# Patient Record
Sex: Male | Born: 1950 | Race: Black or African American | Hispanic: No | Marital: Married | Smoking: Never smoker
Health system: Southern US, Community
[De-identification: ages and names within clinical notes are randomized; demographics above are authoritative.]

## PROBLEM LIST (undated history)

## (undated) DIAGNOSIS — E785 Hyperlipidemia, unspecified: Secondary | ICD-10-CM

## (undated) DIAGNOSIS — I2510 Atherosclerotic heart disease of native coronary artery without angina pectoris: Secondary | ICD-10-CM

## (undated) DIAGNOSIS — N029 Recurrent and persistent hematuria with unspecified morphologic changes: Secondary | ICD-10-CM

## (undated) DIAGNOSIS — I10 Essential (primary) hypertension: Secondary | ICD-10-CM

## (undated) DIAGNOSIS — N189 Chronic kidney disease, unspecified: Secondary | ICD-10-CM

## (undated) HISTORY — DX: Atherosclerotic heart disease of native coronary artery without angina pectoris: I25.10

## (undated) HISTORY — DX: Recurrent and persistent hematuria with unspecified morphologic changes: N02.9

## (undated) HISTORY — DX: Chronic kidney disease, unspecified: N18.9

## (undated) HISTORY — DX: Essential (primary) hypertension: I10

## (undated) HISTORY — DX: Hyperlipidemia, unspecified: E78.5

## (undated) HISTORY — PX: OTHER SURGICAL HISTORY: SHX169

---

## 2005-02-09 ENCOUNTER — Emergency Department: Payer: Self-pay | Admitting: Emergency Medicine

## 2005-02-09 ENCOUNTER — Other Ambulatory Visit: Payer: Self-pay

## 2006-02-27 ENCOUNTER — Ambulatory Visit: Payer: Self-pay | Admitting: Gastroenterology

## 2009-04-06 ENCOUNTER — Ambulatory Visit: Payer: Self-pay | Admitting: Cardiovascular Disease

## 2010-03-08 ENCOUNTER — Ambulatory Visit: Payer: Self-pay

## 2015-03-23 DIAGNOSIS — E039 Hypothyroidism, unspecified: Secondary | ICD-10-CM

## 2015-03-23 DIAGNOSIS — M109 Gout, unspecified: Secondary | ICD-10-CM | POA: Insufficient documentation

## 2015-03-23 DIAGNOSIS — I25119 Atherosclerotic heart disease of native coronary artery with unspecified angina pectoris: Secondary | ICD-10-CM

## 2015-03-23 DIAGNOSIS — N029 Recurrent and persistent hematuria with unspecified morphologic changes: Secondary | ICD-10-CM

## 2015-03-23 DIAGNOSIS — E785 Hyperlipidemia, unspecified: Secondary | ICD-10-CM | POA: Insufficient documentation

## 2015-03-23 DIAGNOSIS — N182 Chronic kidney disease, stage 2 (mild): Secondary | ICD-10-CM | POA: Insufficient documentation

## 2015-03-23 DIAGNOSIS — I10 Essential (primary) hypertension: Secondary | ICD-10-CM | POA: Insufficient documentation

## 2015-03-23 DIAGNOSIS — I2510 Atherosclerotic heart disease of native coronary artery without angina pectoris: Secondary | ICD-10-CM | POA: Insufficient documentation

## 2015-03-25 ENCOUNTER — Ambulatory Visit (INDEPENDENT_AMBULATORY_CARE_PROVIDER_SITE_OTHER): Payer: BLUE CROSS/BLUE SHIELD | Admitting: Family Medicine

## 2015-03-25 ENCOUNTER — Encounter: Payer: Self-pay | Admitting: Family Medicine

## 2015-03-25 VITALS — BP 137/78 | HR 60 | Temp 98.8°F | Ht 66.0 in | Wt 232.8 lb

## 2015-03-25 DIAGNOSIS — I10 Essential (primary) hypertension: Secondary | ICD-10-CM | POA: Diagnosis not present

## 2015-03-25 DIAGNOSIS — E785 Hyperlipidemia, unspecified: Secondary | ICD-10-CM | POA: Diagnosis not present

## 2015-03-25 DIAGNOSIS — E039 Hypothyroidism, unspecified: Secondary | ICD-10-CM | POA: Diagnosis not present

## 2015-03-25 DIAGNOSIS — I2510 Atherosclerotic heart disease of native coronary artery without angina pectoris: Secondary | ICD-10-CM | POA: Diagnosis not present

## 2015-03-25 DIAGNOSIS — I2583 Coronary atherosclerosis due to lipid rich plaque: Secondary | ICD-10-CM

## 2015-03-25 DIAGNOSIS — M1000 Idiopathic gout, unspecified site: Secondary | ICD-10-CM

## 2015-03-25 LAB — LIPID PANEL PICCOLO, WAIVED
CHOL/HDL RATIO PICCOLO,WAIVE: 2.9 mg/dL
Cholesterol Piccolo, Waived: 103 mg/dL (ref <200)
HDL CHOL PICCOLO, WAIVED: 35 mg/dL — AB (ref >59)
LDL CHOL CALC PICCOLO WAIVED: 50 mg/dL (ref <100)
Triglycerides Piccolo,Waived: 88 mg/dL (ref <150)
VLDL CHOL CALC PICCOLO,WAIVE: 18 mg/dL (ref <30)

## 2015-03-25 LAB — AST (SGOT) PICCOLO, WAIVED: AST (SGOT) Piccolo, Waived: 26 U/L (ref 11–38)

## 2015-03-25 LAB — ALT (SGPT) PICCOLO, WAIVED: ALT (SGPT) PICCOLO, WAIVED: 19 U/L (ref 10–47)

## 2015-03-25 MED ORDER — CARVEDILOL 25 MG PO TABS: 25.0000 mg | ORAL_TABLET | Freq: Two times a day (BID) | ORAL | Status: DC

## 2015-03-25 MED ORDER — ROSUVASTATIN CALCIUM 40 MG PO TABS: 40.0000 mg | ORAL_TABLET | Freq: Every day | ORAL | Status: DC

## 2015-03-25 MED ORDER — ALLOPURINOL 300 MG PO TABS: 300.0000 mg | ORAL_TABLET | Freq: Every day | ORAL | Status: DC

## 2015-03-25 MED ORDER — BENAZEPRIL HCL 40 MG PO TABS: 40.0000 mg | ORAL_TABLET | Freq: Every day | ORAL | Status: DC

## 2015-03-25 MED ORDER — AMLODIPINE BESYLATE 10 MG PO TABS: 10.0000 mg | ORAL_TABLET | Freq: Every day | ORAL | Status: DC

## 2015-03-25 MED ORDER — TRIAMCINOLONE ACETONIDE 55 MCG/ACT NA AERO: 2.0000 | INHALATION_SPRAY | Freq: Every day | NASAL | Status: DC

## 2015-03-25 NOTE — Assessment & Plan Note (Signed)
The current medical regimen is effective;  continue present plan and medications.  

## 2015-03-25 NOTE — Assessment & Plan Note (Signed)
Followed by cardiology 

## 2015-03-25 NOTE — Progress Notes (Signed)
BP 137/78 mmHg  Pulse 60  Temp(Src) 98.8 F (37.1 C)  Ht 5\' 6"  (1.676 m)  Wt 232 lb 12.8 oz (105.597 kg)  BMI 37.59 kg/m2  SpO2 99%   Subjective:    Patient ID: Ricky Melton, male    DOB: [birth year], 64 y.o.   MRN: [id number]  HPI: Ricky Melton is a 64 y.o. male  Chief Complaint  Patient presents with  . Hyperlipidemia  . Hypertension  no gout sx doing well  Doing well with all medicine. Takes everyday with no side effects. Stable from last visit.  Medical problems reviewed and stable Relevant past medical, surgical, family and social history reviewed and updated as indicated. Interim medical history since our last visit reviewed. Allergies and medications reviewed and updated.  Review of Systems  Constitutional: Negative.   Respiratory: Negative.   Cardiovascular: Negative.     Per HPI unless specifically indicated above     Objective:    BP 137/78 mmHg  Pulse 60  Temp(Src) 98.8 F (37.1 C)  Ht 5\' 6"  (1.676 m)  Wt 232 lb 12.8 oz (105.597 kg)  BMI 37.59 kg/m2  SpO2 99%  Wt Readings from Last 3 Encounters:  03/25/15 232 lb 12.8 oz (105.597 kg)  03/23/15 243 lb (110.224 kg)    Physical Exam  Constitutional: He is oriented to person, place, and time. He appears well-developed and well-nourished. No distress.  HENT:  Head: Normocephalic and atraumatic.  Right Ear: Hearing normal.  Left Ear: Hearing normal.  Nose: Nose normal.  Eyes: Conjunctivae and lids are normal. Right eye exhibits no discharge. Left eye exhibits no discharge. No scleral icterus.  Cardiovascular: Normal rate, regular rhythm and normal heart sounds.   Pulmonary/Chest: Effort normal and breath sounds normal. No respiratory distress.  Musculoskeletal: Normal range of motion.  Neurological: He is alert and oriented to person, place, and time.  Skin: Skin is intact. No rash noted.  Psychiatric: He has a normal mood and affect. His speech is normal and behavior is normal. Judgment and  thought content normal. Cognition and memory are normal.    No results found for this or any previous visit.    Assessment & Plan:   Problem List Items Addressed This Visit      Cardiovascular and Mediastinum   CAD (coronary artery disease)    Followed by cardiology      Relevant Medications   aspirin 81 MG tablet   rosuvastatin (CRESTOR) 40 MG tablet   carvedilol (COREG) 25 MG tablet   benazepril (LOTENSIN) 40 MG tablet   amLODipine (NORVASC) 10 MG tablet   Hypertension    .........Marland KitchenMarland KitchenThe current medical regimen is effective;  continue present plan and medications.       Relevant Medications   aspirin 81 MG tablet   rosuvastatin (CRESTOR) 40 MG tablet   carvedilol (COREG) 25 MG tablet   benazepril (LOTENSIN) 40 MG tablet   amLODipine (NORVASC) 10 MG tablet     Endocrine   Hypothyroidism    The current medical regimen is effective;  continue present plan and medications.       Relevant Medications   carvedilol (COREG) 25 MG tablet     Other   Hyperlipidemia    The current medical regimen is effective;  continue present plan and medications.       Relevant Medications   aspirin 81 MG tablet   rosuvastatin (CRESTOR) 40 MG tablet   carvedilol (COREG) 25 MG tablet   benazepril (  LOTENSIN) 40 MG tablet   amLODipine (NORVASC) 10 MG tablet   Other Relevant Orders   Lipid Panel Piccolo, Waived   AST (SGOT) Piccolo, Waived   ALT (SGPT) Piccolo, Waived   Basic Metabolic Panel (BMET)   Gout    The current medical regimen is effective;  continue present plan and medications.       Relevant Medications   allopurinol (ZYLOPRIM) 300 MG tablet    Other Visit Diagnoses    Essential hypertension, benign    -  Primary    Relevant Medications    aspirin 81 MG tablet    rosuvastatin (CRESTOR) 40 MG tablet    carvedilol (COREG) 25 MG tablet    benazepril (LOTENSIN) 40 MG tablet    amLODipine (NORVASC) 10 MG tablet    Other Relevant Orders    Lipid Panel Piccolo,  Waived    AST (SGOT) Piccolo, Waived    ALT (SGPT) Piccolo, Waived    Basic Metabolic Panel (BMET)        Follow up plan: Return in about 6 months (around 09/25/2015), or if symptoms worsen or fail to improve, for Physical Exam.

## 2015-03-26 LAB — BASIC METABOLIC PANEL
BUN/Creatinine Ratio: 16 (ref 10–22)
BUN: 19 mg/dL (ref 8–27)
CO2: 21 mmol/L (ref 18–29)
Calcium: 9.6 mg/dL (ref 8.6–10.2)
Chloride: 104 mmol/L (ref 97–108)
Creatinine, Ser: 1.21 mg/dL (ref 0.76–1.27)
GFR calc Af Amer: 73 mL/min/{1.73_m2} (ref >59)
GFR, EST NON AFRICAN AMERICAN: 63 mL/min/{1.73_m2} (ref >59)
GLUCOSE: 95 mg/dL (ref 65–99)
POTASSIUM: 4.8 mmol/L (ref 3.5–5.2)
SODIUM: 143 mmol/L (ref 134–144)

## 2015-08-30 ENCOUNTER — Other Ambulatory Visit: Payer: Self-pay | Admitting: Family Medicine

## 2015-10-14 ENCOUNTER — Encounter: Payer: Self-pay | Admitting: Family Medicine

## 2015-10-14 ENCOUNTER — Ambulatory Visit (INDEPENDENT_AMBULATORY_CARE_PROVIDER_SITE_OTHER): Payer: BLUE CROSS/BLUE SHIELD | Admitting: Family Medicine

## 2015-10-14 VITALS — BP 139/80 | HR 66 | Temp 98.6°F | Ht 67.0 in | Wt 238.0 lb

## 2015-10-14 DIAGNOSIS — M1000 Idiopathic gout, unspecified site: Secondary | ICD-10-CM

## 2015-10-14 DIAGNOSIS — E039 Hypothyroidism, unspecified: Secondary | ICD-10-CM | POA: Diagnosis not present

## 2015-10-14 DIAGNOSIS — Z0000 Encounter for general adult medical examination without abnormal findings: Secondary | ICD-10-CM

## 2015-10-14 DIAGNOSIS — Z1211 Encounter for screening for malignant neoplasm of colon: Secondary | ICD-10-CM

## 2015-10-14 DIAGNOSIS — I10 Essential (primary) hypertension: Secondary | ICD-10-CM

## 2015-10-14 DIAGNOSIS — Z23 Encounter for immunization: Secondary | ICD-10-CM

## 2015-10-14 DIAGNOSIS — Z113 Encounter for screening for infections with a predominantly sexual mode of transmission: Secondary | ICD-10-CM

## 2015-10-14 DIAGNOSIS — J019 Acute sinusitis, unspecified: Secondary | ICD-10-CM

## 2015-10-14 DIAGNOSIS — E785 Hyperlipidemia, unspecified: Secondary | ICD-10-CM

## 2015-10-14 DIAGNOSIS — I2510 Atherosclerotic heart disease of native coronary artery without angina pectoris: Secondary | ICD-10-CM

## 2015-10-14 DIAGNOSIS — I2583 Coronary atherosclerosis due to lipid rich plaque: Secondary | ICD-10-CM

## 2015-10-14 MED ORDER — ROSUVASTATIN CALCIUM 40 MG PO TABS: 40.0000 mg | ORAL_TABLET | Freq: Every day | ORAL | Status: DC

## 2015-10-14 MED ORDER — BENAZEPRIL HCL 40 MG PO TABS: 40.0000 mg | ORAL_TABLET | Freq: Every day | ORAL | Status: DC

## 2015-10-14 MED ORDER — LEVOTHYROXINE SODIUM 50 MCG PO TABS: 50.0000 ug | ORAL_TABLET | Freq: Every day | ORAL | Status: DC

## 2015-10-14 MED ORDER — AMOXICILLIN 875 MG PO TABS: 875.0000 mg | ORAL_TABLET | Freq: Two times a day (BID) | ORAL | Status: DC

## 2015-10-14 MED ORDER — CARVEDILOL 25 MG PO TABS: 25.0000 mg | ORAL_TABLET | Freq: Two times a day (BID) | ORAL | Status: DC

## 2015-10-14 MED ORDER — ALLOPURINOL 300 MG PO TABS: 300.0000 mg | ORAL_TABLET | Freq: Every day | ORAL | Status: DC

## 2015-10-14 MED ORDER — AMLODIPINE BESYLATE 10 MG PO TABS: 10.0000 mg | ORAL_TABLET | Freq: Every day | ORAL | Status: DC

## 2015-10-14 NOTE — Progress Notes (Signed)
BP 139/80 mmHg  Pulse 66  Temp(Src) 98.6 F (37 C)  Ht 5\' 7"  (1.702 m)  Wt 238 lb (107.956 kg)  BMI 37.27 kg/m2  SpO2 98%   Subjective:    Patient ID: Ricky Melton, male    DOB: Aug 13, 1951, 64 y.o.   MRN: [id number]  HPI: Ricky Melton is a 64 y.o. male  Chief Complaint  Patient presents with  . Annual Exam  . URI   patient with head cold ongoing about 2 weeks getting worse with sinus pressure congestion drainage as tried some over-the-counter medicines with no real effect coughing some no real fever Patient's other medicines doing well with no gout signs or symptoms Blood pressures doing well No chest pain chest tightness Cholesterols doing well Taking medications faithfully with no side effects Both ears are stopped up patient concerned has got wax  Relevant past medical, surgical, family and social history reviewed and updated as indicated. Interim medical history since our last visit reviewed. Allergies and medications reviewed and updated.  Other than noted above Review of Systems  Constitutional: Negative.   HENT: Negative.   Eyes: Negative.   Respiratory: Negative.   Cardiovascular: Negative.   Gastrointestinal: Negative.   Endocrine: Negative.   Genitourinary: Negative.   Musculoskeletal: Negative.   Skin: Negative.   Allergic/Immunologic: Negative.   Neurological: Negative.   Hematological: Negative.   Psychiatric/Behavioral: Negative.     Per HPI unless specifically indicated above     Objective:    BP 139/80 mmHg  Pulse 66  Temp(Src) 98.6 F (37 C)  Ht 5\' 7"  (1.702 m)  Wt 238 lb (107.956 kg)  BMI 37.27 kg/m2  SpO2 98%  Wt Readings from Last 3 Encounters:  10/14/15 238 lb (107.956 kg)  03/25/15 232 lb 12.8 oz (105.597 kg)  03/23/15 243 lb (110.224 kg)    Physical Exam  Constitutional: He is oriented to person, place, and time. He appears well-developed and well-nourished.  HENT:  Head: Normocephalic and atraumatic.  Right Ear:  External ear normal.  Left Ear: External ear normal.  Eyes: Conjunctivae and EOM are normal. Pupils are equal, round, and reactive to light.  Neck: Normal range of motion. Neck supple.  Cardiovascular: Normal rate, regular rhythm, normal heart sounds and intact distal pulses.   Pulmonary/Chest: Effort normal and breath sounds normal.  Abdominal: Soft. Bowel sounds are normal. There is no splenomegaly or hepatomegaly.  Genitourinary: Rectum normal, prostate normal and penis normal.  Musculoskeletal: Normal range of motion.  Neurological: He is alert and oriented to person, place, and time. He has normal reflexes.  Skin: No rash noted. No erythema.  Psychiatric: He has a normal mood and affect. His behavior is normal. Judgment and thought content normal.    Results for orders placed or performed in visit on 03/25/15  Lipid Panel Piccolo, [employer]  Result Value Ref Range   Cholesterol Piccolo, Waived 103 <200 mg/dL   HDL Chol Piccolo, Waived 35 (L) >59 mg/dL   Triglycerides Piccolo,Waived 88 <150 mg/dL   Chol/HDL Ratio Piccolo,Waive 2.9 mg/dL   LDL Chol Calc Piccolo Waived 50 <100 mg/dL   VLDL Chol Calc Piccolo,Waive 18 <30 mg/dL  AST (SGOT) Piccolo, Waived  Result Value Ref Range   AST (SGOT) Piccolo, Waived 26 11 - 38 U/L  ALT (SGPT) Piccolo, Waived  Result Value Ref Range   ALT (SGPT) Piccolo, Waived 19 10 - 47 U/L  Basic Metabolic Panel (BMET)  Result Value Ref Range   Glucose  95 65 - 99 mg/dL   BUN 19 8 - 27 mg/dL   Creatinine, Ser 1.21 0.76 - 1.27 mg/dL   GFR calc non Af Amer 63 >59 mL/min/1.73   GFR calc Af Amer 73 >59 mL/min/1.73   BUN/Creatinine Ratio 16 10 - 22   Sodium 143 134 - 144 mmol/L   Potassium 4.8 3.5 - 5.2 mmol/L   Chloride 104 97 - 108 mmol/L   CO2 21 18 - 29 mmol/L   Calcium 9.6 8.6 - 10.2 mg/dL      Assessment & Plan:   Problem List Items Addressed This Visit      Cardiovascular and Mediastinum   Hypertension    The current medical regimen is  effective;  continue present plan and medications.       Relevant Medications   amLODipine (NORVASC) 10 MG tablet   benazepril (LOTENSIN) 40 MG tablet   rosuvastatin (CRESTOR) 40 MG tablet   carvedilol (COREG) 25 MG tablet   CAD (coronary artery disease)    The current medical regimen is effective;  continue present plan and medications.       Relevant Medications   amLODipine (NORVASC) 10 MG tablet   benazepril (LOTENSIN) 40 MG tablet   rosuvastatin (CRESTOR) 40 MG tablet   carvedilol (COREG) 25 MG tablet     Endocrine   Hypothyroidism    The current medical regimen is effective;  continue present plan and medications.       Relevant Medications   levothyroxine (SYNTHROID, LEVOTHROID) 50 MCG tablet   carvedilol (COREG) 25 MG tablet     Other   Hyperlipidemia    The current medical regimen is effective;  continue present plan and medications.       Relevant Medications   amLODipine (NORVASC) 10 MG tablet   benazepril (LOTENSIN) 40 MG tablet   rosuvastatin (CRESTOR) 40 MG tablet   carvedilol (COREG) 25 MG tablet   Gout    The current medical regimen is effective;  continue present plan and medications.       Relevant Medications   allopurinol (ZYLOPRIM) 300 MG tablet    Other Visit Diagnoses    Immunization due    -  Primary    Relevant Orders    Flu Vaccine QUAD 36+ mos PF IM (Fluarix & Fluzone Quad PF) (Completed)    Routine screening for STI (sexually transmitted infection)        Relevant Orders    Hepatitis C Antibody    HIV antibody    Colon cancer screening        Relevant Orders    Ambulatory referral to General Surgery    Acute sinusitis, recurrence not specified, unspecified location         sinusitis care and treatment    Relevant Medications    amoxicillin (AMOXIL) 875 MG tablet    PE (physical exam), annual            Follow up plan: Return in about 6 months (around 04/12/2016), or if symptoms worsen or fail to improve, for Med check,  BMP, lipids, ALT, AST.

## 2015-10-14 NOTE — Assessment & Plan Note (Signed)
The current medical regimen is effective;  continue present plan and medications.  

## 2015-10-15 ENCOUNTER — Other Ambulatory Visit: Payer: Self-pay | Admitting: Family Medicine

## 2015-10-15 LAB — HEPATITIS C ANTIBODY

## 2015-10-15 LAB — HIV ANTIBODY (ROUTINE TESTING W REFLEX): HIV SCREEN 4TH GENERATION: NONREACTIVE

## 2015-10-16 ENCOUNTER — Encounter: Payer: Self-pay | Admitting: Family Medicine

## 2015-10-18 ENCOUNTER — Telehealth: Payer: Self-pay

## 2015-10-18 NOTE — Telephone Encounter (Signed)
Gastroenterology Pre-Procedure Review  Request Date: TBD Requesting Physician: Dr. Jeananne Rama  PATIENT REVIEW QUESTIONS: The patient responded to the following health history questions as indicated:    1. Are you having any GI issues? no 2. Do you have a personal history of Polyps? no 3. Do you have a family history of Colon Cancer or Polyps? no 4. Diabetes Mellitus? no 5. Joint replacements in the past 12 months?no 6. Major health problems in the past 3 months?no 7. Any artificial heart valves, MVP, or defibrillator?yes (Stents)    MEDICATIONS & ALLERGIES:    Patient reports the following regarding taking any anticoagulation/antiplatelet therapy:   Plavix, Coumadin, Eliquis, Xarelto, Lovenox, Pradaxa, Brilinta, or Effient? yes (Plavix 75mg ) Aspirin? yes (ASA 81mg )  Patient confirms/reports the following medications:  Current Outpatient Prescriptions  Medication Sig Dispense Refill  . allopurinol (ZYLOPRIM) 300 MG tablet Take 1 tablet (300 mg total) by mouth daily. 90 tablet 4  . amLODipine (NORVASC) 10 MG tablet Take 1 tablet (10 mg total) by mouth daily. 90 tablet 4  . amoxicillin (AMOXIL) 875 MG tablet Take 1 tablet (875 mg total) by mouth 2 (two) times daily. 20 tablet 0  . aspirin 81 MG tablet Take 81 mg by mouth daily.    . benazepril (LOTENSIN) 40 MG tablet TAKE 1 TABLET BY MOUTH ONCE A DAY 30 tablet 6  . carvedilol (COREG) 25 MG tablet Take 1 tablet (25 mg total) by mouth 2 (two) times daily with a meal. 180 tablet 4  . clopidogrel (PLAVIX) 75 MG tablet Take 75 mg by mouth daily.    . furosemide (LASIX) 20 MG tablet Take 20 mg by mouth.    . levothyroxine (SYNTHROID, LEVOTHROID) 50 MCG tablet Take 1 tablet (50 mcg total) by mouth daily. 90 tablet 4  . potassium chloride (K-DUR,KLOR-CON) 10 MEQ tablet Take 10 mEq by mouth daily.    . rosuvastatin (CRESTOR) 40 MG tablet Take 1 tablet (40 mg total) by mouth daily. 90 tablet 4  . triamcinolone (NASACORT ALLERGY 24HR) 55 MCG/ACT  AERO nasal inhaler Place 2 sprays into the nose daily. 3 Inhaler 12   No current facility-administered medications for this visit.    Patient confirms/reports the following allergies:  No Known Allergies  No orders of the defined types were placed in this encounter.    AUTHORIZATION INFORMATION Primary Insurance: 1D#: Group #:  Secondary Insurance: 1D#: Group #:  SCHEDULE INFORMATION: Date: TBD - Needs to speak with wife about a date Time: Location:

## 2015-10-20 ENCOUNTER — Telehealth: Payer: Self-pay

## 2015-10-20 DIAGNOSIS — I2583 Coronary atherosclerosis due to lipid rich plaque: Principal | ICD-10-CM

## 2015-10-20 DIAGNOSIS — I2510 Atherosclerotic heart disease of native coronary artery without angina pectoris: Secondary | ICD-10-CM

## 2015-10-20 NOTE — Telephone Encounter (Signed)
Patient wants his Cholesterol Number from last week, I don't see where we ran one.  Do we need to add on, Lattie Haw says we can add 10/21/15

## 2015-10-21 NOTE — Telephone Encounter (Signed)
Labs are being added on, results to come.  Patient to come back in for CBC

## 2015-10-22 LAB — COMPREHENSIVE METABOLIC PANEL
A/G RATIO: 1.7 (ref 1.1–2.5)
ALT: 15 IU/L (ref 0–44)
AST: 19 IU/L (ref 0–40)
Albumin: 4.3 g/dL (ref 3.6–4.8)
Alkaline Phosphatase: 54 IU/L (ref 39–117)
BUN/Creatinine Ratio: 13 (ref 10–22)
BUN: 17 mg/dL (ref 8–27)
Bilirubin Total: 0.3 mg/dL (ref 0.0–1.2)
CO2: 22 mmol/L (ref 18–29)
Calcium: 9.5 mg/dL (ref 8.6–10.2)
Chloride: 103 mmol/L (ref 96–106)
Creatinine, Ser: 1.31 mg/dL — ABNORMAL HIGH (ref 0.76–1.27)
GFR, EST AFRICAN AMERICAN: 66 mL/min/{1.73_m2} (ref >59)
GFR, EST NON AFRICAN AMERICAN: 57 mL/min/{1.73_m2} — AB (ref >59)
GLUCOSE: 105 mg/dL — AB (ref 65–99)
Globulin, Total: 2.5 g/dL (ref 1.5–4.5)
POTASSIUM: 4.7 mmol/L (ref 3.5–5.2)
Sodium: 144 mmol/L (ref 134–144)
Total Protein: 6.8 g/dL (ref 6.0–8.5)

## 2015-10-22 LAB — URIC ACID: URIC ACID: 6.4 mg/dL (ref 3.7–8.6)

## 2015-10-22 LAB — SPECIMEN STATUS REPORT

## 2015-10-22 LAB — LIPID PANEL W/O CHOL/HDL RATIO
Cholesterol, Total: 118 mg/dL (ref 100–199)
HDL: 39 mg/dL — AB (ref >39)
LDL Calculated: 60 mg/dL (ref 0–99)
Triglycerides: 95 mg/dL (ref 0–149)
VLDL CHOLESTEROL CAL: 19 mg/dL (ref 5–40)

## 2015-10-22 LAB — PSA: PROSTATE SPECIFIC AG, SERUM: 0.8 ng/mL (ref 0.0–4.0)

## 2015-10-22 LAB — TSH: TSH: 3.09 u[IU]/mL (ref 0.450–4.500)

## 2015-10-25 ENCOUNTER — Other Ambulatory Visit: Payer: Medicare Other

## 2015-10-25 ENCOUNTER — Encounter: Payer: Self-pay | Admitting: Family Medicine

## 2015-10-25 DIAGNOSIS — I2510 Atherosclerotic heart disease of native coronary artery without angina pectoris: Secondary | ICD-10-CM | POA: Diagnosis not present

## 2015-10-25 DIAGNOSIS — I2583 Coronary atherosclerosis due to lipid rich plaque: Principal | ICD-10-CM

## 2015-10-26 LAB — CBC WITH DIFFERENTIAL/PLATELET
HEMATOCRIT: 41.7 % (ref 37.5–51.0)
HEMOGLOBIN: 14.6 g/dL (ref 12.6–17.7)
LYMPHS ABS: 1.8 10*3/uL (ref 0.7–3.1)
LYMPHS: 33 %
MCH: 29.9 pg (ref 26.6–33.0)
MCHC: 35 g/dL (ref 31.5–35.7)
MCV: 85 fL (ref 79–97)
MID (ABSOLUTE): 0.7 10*3/uL (ref 0.1–1.6)
MID: 14 %
Neutrophils Absolute: 3 10*3/uL (ref 1.4–7.0)
Neutrophils: 54 %
Platelets: 222 10*3/uL (ref 150–379)
RBC: 4.89 x10E6/uL (ref 4.14–5.80)
RDW: 14.4 % (ref 12.3–15.4)
WBC: 5.5 10*3/uL (ref 3.4–10.8)

## 2015-12-22 ENCOUNTER — Other Ambulatory Visit: Payer: Self-pay | Admitting: Family Medicine

## 2016-01-02 ENCOUNTER — Other Ambulatory Visit: Payer: Self-pay | Admitting: Family Medicine

## 2016-01-18 ENCOUNTER — Other Ambulatory Visit: Payer: Self-pay | Admitting: Family Medicine

## 2016-01-18 ENCOUNTER — Telehealth: Payer: Self-pay | Admitting: Family Medicine

## 2016-01-18 MED ORDER — LOVASTATIN 40 MG PO TABS: 40.0000 mg | ORAL_TABLET | Freq: Every day | ORAL | Status: DC

## 2016-01-18 NOTE — Telephone Encounter (Signed)
Phone call Discussed with patient having some GI upset with generic Crestor wants to change to something else will give lovastatin will recheck in a month or so to check lipids ALT AST.

## 2016-01-18 NOTE — Telephone Encounter (Signed)
Pt called stated new medication is causing him to have stomach pain. Please call pt to follow up. Thanks.

## 2016-01-20 DIAGNOSIS — E782 Mixed hyperlipidemia: Secondary | ICD-10-CM | POA: Diagnosis not present

## 2016-01-20 DIAGNOSIS — I2510 Atherosclerotic heart disease of native coronary artery without angina pectoris: Secondary | ICD-10-CM | POA: Diagnosis not present

## 2016-01-25 ENCOUNTER — Other Ambulatory Visit: Payer: Self-pay | Admitting: Family Medicine

## 2016-04-13 ENCOUNTER — Telehealth: Payer: Self-pay | Admitting: Family Medicine

## 2016-04-13 ENCOUNTER — Ambulatory Visit (INDEPENDENT_AMBULATORY_CARE_PROVIDER_SITE_OTHER): Payer: BLUE CROSS/BLUE SHIELD | Admitting: Family Medicine

## 2016-04-13 ENCOUNTER — Other Ambulatory Visit: Payer: Self-pay | Admitting: Family Medicine

## 2016-04-13 ENCOUNTER — Encounter: Payer: Self-pay | Admitting: Family Medicine

## 2016-04-13 VITALS — BP 134/76 | HR 64 | Temp 97.9°F | Ht 66.5 in | Wt 241.0 lb

## 2016-04-13 DIAGNOSIS — J0100 Acute maxillary sinusitis, unspecified: Secondary | ICD-10-CM

## 2016-04-13 DIAGNOSIS — I10 Essential (primary) hypertension: Secondary | ICD-10-CM | POA: Diagnosis not present

## 2016-04-13 DIAGNOSIS — Z23 Encounter for immunization: Secondary | ICD-10-CM | POA: Diagnosis not present

## 2016-04-13 DIAGNOSIS — E785 Hyperlipidemia, unspecified: Secondary | ICD-10-CM

## 2016-04-13 LAB — LP+ALT+AST PICCOLO, WAIVED
ALT (SGPT) Piccolo, Waived: 15 U/L (ref 10–47)
AST (SGOT) PICCOLO, WAIVED: 28 U/L (ref 11–38)
CHOL/HDL RATIO PICCOLO,WAIVE: 3.8 mg/dL
CHOLESTEROL PICCOLO, WAIVED: 136 mg/dL (ref <200)
HDL Chol Piccolo, Waived: 36 mg/dL — ABNORMAL LOW (ref >59)
LDL Chol Calc Piccolo Waived: 79 mg/dL (ref <100)
TRIGLYCERIDES PICCOLO,WAIVED: 104 mg/dL (ref <150)
VLDL Chol Calc Piccolo,Waive: 21 mg/dL (ref <30)

## 2016-04-13 MED ORDER — BENAZEPRIL HCL 40 MG PO TABS: 40.0000 mg | ORAL_TABLET | Freq: Every day | ORAL | 2 refills | Status: DC

## 2016-04-13 MED ORDER — LOVASTATIN 40 MG PO TABS: 40.0000 mg | ORAL_TABLET | Freq: Every day | ORAL | 2 refills | Status: DC

## 2016-04-13 MED ORDER — AMOXICILLIN-POT CLAVULANATE 875-125 MG PO TABS: 1.0000 | ORAL_TABLET | Freq: Two times a day (BID) | ORAL | 0 refills | Status: DC

## 2016-04-13 MED ORDER — TRIAMCINOLONE ACETONIDE 55 MCG/ACT NA AERO: 2.0000 | INHALATION_SPRAY | Freq: Every day | NASAL | 12 refills | Status: DC

## 2016-04-13 MED ORDER — LEVOTHYROXINE SODIUM 50 MCG PO TABS: 50.0000 ug | ORAL_TABLET | Freq: Every day | ORAL | 2 refills | Status: DC

## 2016-04-13 NOTE — Patient Instructions (Signed)
Pneumococcal Conjugate Vaccine (PCV13)  1. Why get vaccinated? Vaccination can protect both children and adults from pneumococcal disease. Pneumococcal disease is caused by bacteria that can spread from person to person through close contact. It can cause ear infections, and it can also lead to more serious infections of the:  Lungs (pneumonia),  Blood (bacteremia), and  Covering of the brain and spinal cord (meningitis). Pneumococcal pneumonia is most common among adults. Pneumococcal meningitis can cause deafness and brain damage, and it kills about 1 child in 10 who get it. Anyone can get pneumococcal disease, but children under 28 years of age and adults 43 years and older, people with certain medical conditions, and cigarette smokers are at the highest risk. Before there was a vaccine, the [place] saw:  more than 700 cases of meningitis,  about 13,000 blood infections,  about 5 million ear infections, and  about 200 deaths in children under 5 each year from pneumococcal disease. Since vaccine became available, severe pneumococcal disease in these children has fallen by 88%. About 18,000 older adults die of pneumococcal disease each year in the [place]. Treatment of pneumococcal infections with penicillin and other drugs is not as effective as it used to be, because some strains of the disease have become resistant to these drugs. This makes prevention of the disease, through vaccination, even more important. 2. PCV13 vaccine Pneumococcal conjugate vaccine (called PCV13) protects against 13 types of pneumococcal bacteria. PCV13 is routinely given to children at 2, 4, 6, and 65-74 months of age. It is also recommended for children and adults 70 to 70 years of age with certain health conditions, and for all adults 64 years of age and older. Your doctor can give you details. 3. Some people should not get this vaccine Anyone who has ever had a life-threatening allergic reaction  to a dose of this vaccine, to an earlier pneumococcal vaccine called PCV7, or to any vaccine containing diphtheria toxoid (for example, DTaP), should not get PCV13. Anyone with a severe allergy to any component of PCV13 should not get the vaccine. Tell your doctor if the person being vaccinated has any severe allergies. If the person scheduled for vaccination is not feeling well, your healthcare provider might decide to reschedule the shot on another day. 4. Risks of a vaccine reaction With any medicine, including vaccines, there is a chance of reactions. These are usually mild and go away on their own, but serious reactions are also possible. Problems reported following PCV13 varied by age and dose in the series. The most common problems reported among children were:  About half became drowsy after the shot, had a temporary loss of appetite, or had redness or tenderness where the shot was given.  About 1 out of 3 had swelling where the shot was given.  About 1 out of 3 had a mild fever, and about 1 in 20 had a fever over 102.55F.  Up to about 8 out of 10 became fussy or irritable. Adults have reported pain, redness, and swelling where the shot was given; also mild fever, fatigue, headache, chills, or muscle pain. Young children who get PCV13 along with inactivated flu vaccine at the same time may be at increased risk for seizures caused by fever. Ask your doctor for more information. Problems that could happen after any vaccine:  People sometimes faint after a medical procedure, including vaccination. Sitting or lying down for about 15 minutes can help prevent fainting, and injuries caused by a fall.  Tell your doctor if you feel dizzy, or have vision changes or ringing in the ears.  Some older children and adults get severe pain in the shoulder and have difficulty moving the arm where a shot was given. This happens very rarely.  Any medication can cause a severe allergic reaction. Such  reactions from a vaccine are very rare, estimated at about 1 in a million doses, and would happen within a few minutes to a few hours after the vaccination. As with any medicine, there is a very small chance of a vaccine causing a serious injury or death. The safety of vaccines is always being monitored. For more information, visit: http://www.aguilar.org/ 5. What if there is a serious reaction? What should I look for?  Look for anything that concerns you, such as signs of a severe allergic reaction, very high fever, or unusual behavior. Signs of a severe allergic reaction can include hives, swelling of the face and throat, difficulty breathing, a fast heartbeat, dizziness, and weakness-usually within a few minutes to a few hours after the vaccination. What should I do?  If you think it is a severe allergic reaction or other emergency that can't wait, call 9-1-1 or get the person to the nearest hospital. Otherwise, call your doctor. Reactions should be reported to the Vaccine Adverse Event Reporting System (VAERS). Your doctor should file this report, or you can do it yourself through the VAERS web site at www.vaers.SamedayNews.es, or by calling (815) 318-9515. VAERS does not give medical advice. 6. The National Vaccine Injury Compensation Program The [employer] Vaccine Injury Compensation Program (VICP) is a federal program that was created to compensate people who may have been injured by certain vaccines. Persons who believe they may have been injured by a vaccine can learn about the program and about filing a claim by calling 458-779-5984 or visiting the [employer] website at GoldCloset.com.ee. There is a time limit to file a claim for compensation. 7. How can I learn more?  Ask your healthcare provider. He or she can give you the vaccine package insert or suggest other sources of information.  Call your local or state health department.  Contact the Centers for Disease Control and  Prevention (CDC):  Call 218-374-1675 (1-800-CDC-INFO) or  Visit CDC's website at http://hunter.com/ Vaccine Information Statement PCV13 Vaccine (07/16/2014)   This information is not intended to replace advice given to you by your health care provider. Make sure you discuss any questions you have with your health care provider.   Document Released: 06/25/2006 Document Revised: 09/18/2014 Document Reviewed: 07/23/2014 Elsevier Interactive Patient Education 2016 [employer]. Tdap Vaccine (Tetanus, Diphtheria and Pertussis): What You Need to Know 1. Why get vaccinated? Tetanus, diphtheria and pertussis are very serious diseases. Tdap vaccine can protect [place] from these diseases. And, Tdap vaccine given to pregnant women can protect newborn babies against pertussis. TETANUS (Lockjaw) is rare in the [place] today. It causes painful muscle tightening and stiffness, usually all over the body.  It can lead to tightening of muscles in the head and neck so you can't open your mouth, swallow, or sometimes even breathe. Tetanus kills about 1 out of 10 people who are infected even after receiving the best medical care. DIPHTHERIA is also rare in the [place] today. It can cause a thick coating to form in the back of the throat.  It can lead to breathing problems, heart failure, paralysis, and death. PERTUSSIS (Whooping Cough) causes severe coughing spells, which can cause difficulty breathing, vomiting and  disturbed sleep.  It can also lead to weight loss, incontinence, and rib fractures. Up to 2 in 100 adolescents and 5 in 100 adults with pertussis are hospitalized or have complications, which could include pneumonia or death. These diseases are caused by bacteria. Diphtheria and pertussis are spread from person to person through secretions from coughing or sneezing. Tetanus enters the body through cuts, scratches, or wounds. Before vaccines, as many as 200,000 cases of diphtheria,  200,000 cases of pertussis, and hundreds of cases of tetanus, were reported in the [place] each year. Since vaccination began, reports of cases for tetanus and diphtheria have dropped by about 99% and for pertussis by about 80%. 2. Tdap vaccine Tdap vaccine can protect adolescents and adults from tetanus, diphtheria, and pertussis. One dose of Tdap is routinely given at age 35 or 75. People who did not get Tdap at that age should get it as soon as possible. Tdap is especially important for healthcare professionals and anyone having close contact with a baby younger than 12 months. Pregnant women should get a dose of Tdap during every pregnancy, to protect the newborn from pertussis. Infants are most at risk for severe, life-threatening complications from pertussis. Another vaccine, called Td, protects against tetanus and diphtheria, but not pertussis. A Td booster should be given every 10 years. Tdap may be given as one of these boosters if you have never gotten Tdap before. Tdap may also be given after a severe cut or burn to prevent tetanus infection. Your doctor or the person giving you the vaccine can give you more information. Tdap may safely be given at the same time as other vaccines. 3. Some people should not get this vaccine  A person who has ever had a life-threatening allergic reaction after a previous dose of any diphtheria, tetanus or pertussis containing vaccine, OR has a severe allergy to any part of this vaccine, should not get Tdap vaccine. Tell the person giving the vaccine about any severe allergies.  Anyone who had coma or long repeated seizures within 7 days after a childhood dose of DTP or DTaP, or a previous dose of Tdap, should not get Tdap, unless a cause other than the vaccine was found. They can still get Td.  Talk to your doctor if you:  have seizures or another nervous system problem,  had severe pain or swelling after any vaccine containing diphtheria, tetanus or  pertussis,  ever had a condition called Guillain-Barr Syndrome (GBS),  aren't feeling well on the day the shot is scheduled. 4. Risks With any medicine, including vaccines, there is a chance of side effects. These are usually mild and go away on their own. Serious reactions are also possible but are rare. Most people who get Tdap vaccine do not have any problems with it. Mild problems following Tdap (Did not interfere with activities)  Pain where the shot was given (about 3 in 4 adolescents or 2 in 3 adults)  Redness or swelling where the shot was given (about 1 person in 5)  Mild fever of at least 100.15F (up to about 1 in 25 adolescents or 1 in 100 adults)  Headache (about 3 or 4 people in 10)  Tiredness (about 1 person in 3 or 4)  Nausea, vomiting, diarrhea, stomach ache (up to 1 in 4 adolescents or 1 in 10 adults)  Chills, sore joints (about 1 person in 10)  Body aches (about 1 person in 3 or 4)  Rash, swollen glands (uncommon) Moderate  problems following Tdap (Interfered with activities, but did not require medical attention)  Pain where the shot was given (up to 1 in 5 or 6)  Redness or swelling where the shot was given (up to about 1 in 16 adolescents or 1 in 12 adults)  Fever over 102F (about 1 in 100 adolescents or 1 in 250 adults)  Headache (about 1 in 7 adolescents or 1 in 10 adults)  Nausea, vomiting, diarrhea, stomach ache (up to 1 or 3 people in 100)  Swelling of the entire arm where the shot was given (up to about 1 in 500). Severe problems following Tdap (Unable to perform usual activities; required medical attention)  Swelling, severe pain, bleeding and redness in the arm where the shot was given (rare). Problems that could happen after any vaccine:  People sometimes faint after a medical procedure, including vaccination. Sitting or lying down for about 15 minutes can help prevent fainting, and injuries caused by a fall. Tell your doctor if you feel  dizzy, or have vision changes or ringing in the ears.  Some people get severe pain in the shoulder and have difficulty moving the arm where a shot was given. This happens very rarely.  Any medication can cause a severe allergic reaction. Such reactions from a vaccine are very rare, estimated at fewer than 1 in a million doses, and would happen within a few minutes to a few hours after the vaccination. As with any medicine, there is a very remote chance of a vaccine causing a serious injury or death. The safety of vaccines is always being monitored. For more information, visit: http://www.aguilar.org/ 5. What if there is a serious problem? What should I look for?  Look for anything that concerns you, such as signs of a severe allergic reaction, very high fever, or unusual behavior.  Signs of a severe allergic reaction can include hives, swelling of the face and throat, difficulty breathing, a fast heartbeat, dizziness, and weakness. These would usually start a few minutes to a few hours after the vaccination. What should I do?  If you think it is a severe allergic reaction or other emergency that can't wait, call 9-1-1 or get the person to the nearest hospital. Otherwise, call your doctor.  Afterward, the reaction should be reported to the Vaccine Adverse Event Reporting System (VAERS). Your doctor might file this report, or you can do it yourself through the VAERS web site at www.vaers.SamedayNews.es, or by calling 279-366-2887. VAERS does not give medical advice.  6. The National Vaccine Injury Compensation Program The [employer] Vaccine Injury Compensation Program (VICP) is a federal program that was created to compensate people who may have been injured by certain vaccines. Persons who believe they may have been injured by a vaccine can learn about the program and about filing a claim by calling 2810096227 or visiting the [employer] website at GoldCloset.com.ee. There is a time limit  to file a claim for compensation. 7. How can I learn more?  Ask your doctor. He or she can give you the vaccine package insert or suggest other sources of information.  Call your local or state health department.  Contact the Centers for Disease Control and Prevention (CDC):  Call 662-379-8209 (1-800-CDC-INFO) or  Visit CDC's website at http://hunter.com/ CDC Tdap Vaccine VIS (11/04/13)   This information is not intended to replace advice given to you by your health care provider. Make sure you discuss any questions you have with your health care provider.   Document  Released: 02/27/2012 Document Revised: 09/18/2014 Document Reviewed: 12/10/2013 Elsevier Interactive Patient Education [employer].

## 2016-04-13 NOTE — Telephone Encounter (Signed)
Pt called would like a refill on his nasal spray. Stated it was not included with his RX's today. Pharm is [facility] in [place]. Thanks.

## 2016-04-13 NOTE — Progress Notes (Signed)
BP 134/76 (BP Location: Left Arm, Patient Position: Sitting, Cuff Size: Normal)   Pulse 64   Temp 97.9 F (36.6 C)   Ht 5' 6.5" (1.689 m)   Wt 241 lb (109.3 kg)   SpO2 98%   BMI 38.32 kg/m    Subjective:    Patient ID: Ricky Melton, male    DOB: Mar 11, 1951, 65 y.o.   MRN: [id number]  HPI: Ricky Melton is a 65 y.o. male  Chief Complaint  Patient presents with  . Hyperlipidemia  . Hypertension  . URI    recheck medication doing well no complaints cholesterol doing well with lovastatin in no side effects taking medications faithfully. Blood pressure doing well with no complaints good control No cardiac symptoms doing well. Patient has developed a sinus infection drainage congestion and facial pressure especially over left maxillary and frontal sinus area took some leftover amoxicillin which seemed to really helped but symptoms have recurred with some cough slight wheezing at times and just not feeling well.  Relevant past medical, surgical, family and social history reviewed and updated as indicated. Interim medical history since our last visit reviewed. Allergies and medications reviewed and updated.  Review of Systems  Constitutional: Positive for chills, diaphoresis, fatigue and fever.  HENT: Positive for congestion, rhinorrhea, sinus pressure, sneezing and sore throat.   Respiratory: Positive for cough and wheezing. Negative for shortness of breath.   Cardiovascular: Negative.     Per HPI unless specifically indicated above     Objective:    BP 134/76 (BP Location: Left Arm, Patient Position: Sitting, Cuff Size: Normal)   Pulse 64   Temp 97.9 F (36.6 C)   Ht 5' 6.5" (1.689 m)   Wt 241 lb (109.3 kg)   SpO2 98%   BMI 38.32 kg/m   Wt Readings from Last 3 Encounters:  04/13/16 241 lb (109.3 kg)  10/14/15 238 lb (108 kg)  03/25/15 232 lb 12.8 oz (105.6 kg)    Physical Exam  Constitutional: He is oriented to person, place, and time. He appears  well-developed and well-nourished. No distress.  HENT:  Head: Normocephalic and atraumatic.  Right Ear: Hearing and external ear normal.  Left Ear: Hearing and external ear normal.  Nose: Nose normal.  Mouth/Throat: Oropharyngeal exudate present.  Eyes: Conjunctivae and lids are normal. Right eye exhibits no discharge. Left eye exhibits no discharge. No scleral icterus.  Cardiovascular: Normal rate, regular rhythm and normal heart sounds.   Pulmonary/Chest: Effort normal and breath sounds normal. No respiratory distress.  Musculoskeletal: Normal range of motion.  Lymphadenopathy:    He has no cervical adenopathy.  Neurological: He is alert and oriented to person, place, and time.  Skin: Skin is intact. No rash noted.  Psychiatric: He has a normal mood and affect. His speech is normal and behavior is normal. Judgment and thought content normal. Cognition and memory are normal.    Results for orders placed or performed in visit on 10/25/15  CBC With Differential/Platelet  Result Value Ref Range   WBC 5.5 3.4 - 10.8 x10E3/uL   RBC 4.89 4.14 - 5.80 x10E6/uL   Hemoglobin 14.6 12.6 - 17.7 g/dL   Hematocrit 41.7 37.5 - 51.0 %   MCV 85 79 - 97 fL   MCH 29.9 26.6 - 33.0 pg   MCHC 35.0 31.5 - 35.7 g/dL   RDW 14.4 12.3 - 15.4 %   Platelets 222 150 - 379 x10E3/uL   Neutrophils 54 %   Lymphs  33 %   MID 14 %   Neutrophils Absolute 3.0 1.4 - 7.0 x10E3/uL   Lymphocytes Absolute 1.8 0.7 - 3.1 x10E3/uL   MID (Absolute) 0.7 0.1 - 1.6 X10E3/uL      Assessment & Plan:   Problem List Items Addressed This Visit      Cardiovascular and Mediastinum   Hypertension - Primary   Relevant Medications   lovastatin (MEVACOR) 40 MG tablet   benazepril (LOTENSIN) 40 MG tablet   Other Relevant Orders   LP+ALT+AST Piccolo, Waived   Basic metabolic panel     Other   Hyperlipidemia   Relevant Medications   lovastatin (MEVACOR) 40 MG tablet   benazepril (LOTENSIN) 40 MG tablet   Other Relevant  Orders   LP+ALT+AST Piccolo, Waived   Basic metabolic panel    Other Visit Diagnoses    Need for Tdap vaccination       Relevant Orders   Tdap vaccine greater than or equal to 7yo IM   Need for pneumococcal vaccination       Relevant Orders   Pneumococcal conjugate vaccine 13-valent IM   Acute maxillary sinusitis, recurrence not specified       Discussed sinusitis care and treatment use of medications   Relevant Medications   amoxicillin-clavulanate (AUGMENTIN) 875-125 MG tablet       Follow up plan: Return in about 6 months (around 10/14/2016), or if symptoms worsen or fail to improve, for Physical Exam.

## 2016-04-14 LAB — BASIC METABOLIC PANEL
BUN / CREAT RATIO: 13 (ref 10–24)
BUN: 18 mg/dL (ref 8–27)
CALCIUM: 8.9 mg/dL (ref 8.6–10.2)
CHLORIDE: 105 mmol/L (ref 96–106)
CO2: 22 mmol/L (ref 18–29)
Creatinine, Ser: 1.35 mg/dL — ABNORMAL HIGH (ref 0.76–1.27)
GFR calc non Af Amer: 55 mL/min/{1.73_m2} — ABNORMAL LOW (ref >59)
GFR, EST AFRICAN AMERICAN: 63 mL/min/{1.73_m2} (ref >59)
GLUCOSE: 99 mg/dL (ref 65–99)
POTASSIUM: 4.6 mmol/L (ref 3.5–5.2)
SODIUM: 143 mmol/L (ref 134–144)

## 2016-04-17 ENCOUNTER — Encounter: Payer: Self-pay | Admitting: Family Medicine

## 2016-05-26 DIAGNOSIS — E782 Mixed hyperlipidemia: Secondary | ICD-10-CM | POA: Diagnosis not present

## 2016-05-26 DIAGNOSIS — I10 Essential (primary) hypertension: Secondary | ICD-10-CM | POA: Diagnosis not present

## 2016-05-26 DIAGNOSIS — I2510 Atherosclerotic heart disease of native coronary artery without angina pectoris: Secondary | ICD-10-CM | POA: Diagnosis not present

## 2016-10-18 ENCOUNTER — Other Ambulatory Visit: Payer: Self-pay | Admitting: Family Medicine

## 2016-10-19 ENCOUNTER — Encounter: Payer: Self-pay | Admitting: Family Medicine

## 2016-10-19 ENCOUNTER — Ambulatory Visit (INDEPENDENT_AMBULATORY_CARE_PROVIDER_SITE_OTHER): Payer: BLUE CROSS/BLUE SHIELD | Admitting: Family Medicine

## 2016-10-19 VITALS — BP 128/70 | HR 68 | Temp 98.2°F | Ht 67.52 in | Wt 248.0 lb

## 2016-10-19 DIAGNOSIS — Z1329 Encounter for screening for other suspected endocrine disorder: Secondary | ICD-10-CM | POA: Diagnosis not present

## 2016-10-19 DIAGNOSIS — E785 Hyperlipidemia, unspecified: Secondary | ICD-10-CM

## 2016-10-19 DIAGNOSIS — I2583 Coronary atherosclerosis due to lipid rich plaque: Secondary | ICD-10-CM

## 2016-10-19 DIAGNOSIS — N400 Enlarged prostate without lower urinary tract symptoms: Secondary | ICD-10-CM | POA: Diagnosis not present

## 2016-10-19 DIAGNOSIS — E039 Hypothyroidism, unspecified: Secondary | ICD-10-CM

## 2016-10-19 DIAGNOSIS — Z1211 Encounter for screening for malignant neoplasm of colon: Secondary | ICD-10-CM

## 2016-10-19 DIAGNOSIS — N182 Chronic kidney disease, stage 2 (mild): Secondary | ICD-10-CM

## 2016-10-19 DIAGNOSIS — Z0000 Encounter for general adult medical examination without abnormal findings: Secondary | ICD-10-CM

## 2016-10-19 DIAGNOSIS — Z6838 Body mass index (BMI) 38.0-38.9, adult: Secondary | ICD-10-CM | POA: Diagnosis not present

## 2016-10-19 DIAGNOSIS — I10 Essential (primary) hypertension: Secondary | ICD-10-CM | POA: Diagnosis not present

## 2016-10-19 DIAGNOSIS — I2510 Atherosclerotic heart disease of native coronary artery without angina pectoris: Secondary | ICD-10-CM

## 2016-10-19 DIAGNOSIS — Z125 Encounter for screening for malignant neoplasm of prostate: Secondary | ICD-10-CM | POA: Diagnosis not present

## 2016-10-19 DIAGNOSIS — M1000 Idiopathic gout, unspecified site: Secondary | ICD-10-CM | POA: Diagnosis not present

## 2016-10-19 DIAGNOSIS — Z23 Encounter for immunization: Secondary | ICD-10-CM

## 2016-10-19 LAB — URINALYSIS, ROUTINE W REFLEX MICROSCOPIC
Bilirubin, UA: NEGATIVE
Glucose, UA: NEGATIVE
Ketones, UA: NEGATIVE
LEUKOCYTES UA: NEGATIVE
NITRITE UA: NEGATIVE
PH UA: 6.5 (ref 5.0–7.5)
Specific Gravity, UA: 1.015 (ref 1.005–1.030)
UUROB: 0.2 mg/dL (ref 0.2–1.0)

## 2016-10-19 MED ORDER — BENAZEPRIL HCL 40 MG PO TABS: 40.0000 mg | ORAL_TABLET | Freq: Every day | ORAL | 4 refills | Status: DC

## 2016-10-19 MED ORDER — AMLODIPINE BESYLATE 10 MG PO TABS: 10.0000 mg | ORAL_TABLET | Freq: Every day | ORAL | 4 refills | Status: DC

## 2016-10-19 MED ORDER — LEVOTHYROXINE SODIUM 50 MCG PO TABS: 50.0000 ug | ORAL_TABLET | Freq: Every day | ORAL | 4 refills | Status: DC

## 2016-10-19 MED ORDER — CARVEDILOL 25 MG PO TABS
25.0000 mg | ORAL_TABLET | Freq: Two times a day (BID) | ORAL | 4 refills | Status: DC
Start: 2016-10-19 — End: 2017-10-25

## 2016-10-19 MED ORDER — ALLOPURINOL 300 MG PO TABS: 300.0000 mg | ORAL_TABLET | Freq: Every day | ORAL | 4 refills | Status: DC

## 2016-10-19 MED ORDER — LOVASTATIN 40 MG PO TABS: 40.0000 mg | ORAL_TABLET | Freq: Every day | ORAL | 4 refills | Status: DC

## 2016-10-19 NOTE — Assessment & Plan Note (Signed)
The current medical regimen is effective;  continue present plan and medications.  

## 2016-10-19 NOTE — Progress Notes (Signed)
BP 128/70 (BP Location: Left Arm)   Pulse 68   Temp 98.2 F (36.8 C) (Oral)   Ht 5' 7.52" (1.715 m)   Wt 248 lb (112.5 kg)   SpO2 98%   BMI 38.25 kg/m    Subjective:    Patient ID: Ricky Melton, male    DOB: [birth year], 66 y.o.   MRN: [id number]  HPI: Ricky Melton is a 66 y.o. male  Chief Complaint  Patient presents with  . Annual Exam  Patient follow-up doing well with medications no chest pain chest tightness cardiovascular symptoms. Taking blood pressure medicines without problems side effects. Same with cholesterol medicines and gout medicines no signs or symptoms. Does have a little bit of sinus drainage and maybe some decreased hearing issues.  Relevant past medical, surgical, family and social history reviewed and updated as indicated. Interim medical history since our last visit reviewed. Allergies and medications reviewed and updated.  Review of Systems  Constitutional: Negative.   HENT: Negative.   Eyes: Negative.   Respiratory: Negative.   Cardiovascular: Negative.   Gastrointestinal: Negative.   Endocrine: Negative.   Genitourinary: Negative.   Musculoskeletal: Negative.   Skin: Negative.   Allergic/Immunologic: Negative.   Neurological: Negative.   Hematological: Negative.   Psychiatric/Behavioral: Negative.     Per HPI unless specifically indicated above     Objective:    BP 128/70 (BP Location: Left Arm)   Pulse 68   Temp 98.2 F (36.8 C) (Oral)   Ht 5' 7.52" (1.715 m)   Wt 248 lb (112.5 kg)   SpO2 98%   BMI 38.25 kg/m   Wt Readings from Last 3 Encounters:  10/19/16 248 lb (112.5 kg)  04/13/16 241 lb (109.3 kg)  10/14/15 238 lb (108 kg)    Physical Exam  Constitutional: He is oriented to person, place, and time. He appears well-developed and well-nourished.  HENT:  Head: Normocephalic and atraumatic.  Right Ear: External ear normal.  Left Ear: External ear normal.  Eyes: Conjunctivae and EOM are normal. Pupils are equal,  round, and reactive to light.  Neck: Normal range of motion. Neck supple.  Cardiovascular: Normal rate, regular rhythm, normal heart sounds and intact distal pulses.   Pulmonary/Chest: Effort normal and breath sounds normal.  Abdominal: Soft. Bowel sounds are normal. There is no splenomegaly or hepatomegaly.  Genitourinary: Rectum normal and penis normal.  Genitourinary Comments: BPH  Musculoskeletal: Normal range of motion.  Neurological: He is alert and oriented to person, place, and time. He has normal reflexes.  Skin: No rash noted. No erythema.  Psychiatric: He has a normal mood and affect. His behavior is normal. Judgment and thought content normal.    Results for orders placed or performed in visit on 04/13/16  LP+ALT+AST Piccolo, [employer]  Result Value Ref Range   ALT (SGPT) Piccolo, Waived 15 10 - 47 U/L   AST (SGOT) Piccolo, Waived 28 11 - 38 U/L   Cholesterol Piccolo, Waived 136 <200 mg/dL   HDL Chol Piccolo, Waived 36 (L) >59 mg/dL   Triglycerides Piccolo,Waived 104 <150 mg/dL   Chol/HDL Ratio Piccolo,Waive 3.8 mg/dL   LDL Chol Calc Piccolo Waived 79 <100 mg/dL   VLDL Chol Calc Piccolo,Waive 21 <30 mg/dL  Basic metabolic panel  Result Value Ref Range   Glucose 99 65 - 99 mg/dL   BUN 18 8 - 27 mg/dL   Creatinine, Ser 1.35 (H) 0.76 - 1.27 mg/dL   GFR calc non Af Amer 55 (  L) >59 mL/min/1.73   GFR calc Af Amer 63 >59 mL/min/1.73   BUN/Creatinine Ratio 13 10 - 24   Sodium 143 134 - 144 mmol/L   Potassium 4.6 3.5 - 5.2 mmol/L   Chloride 105 96 - 106 mmol/L   CO2 22 18 - 29 mmol/L   Calcium 8.9 8.6 - 10.2 mg/dL      Assessment & Plan:   Problem List Items Addressed This Visit      Cardiovascular and Mediastinum   CAD (coronary artery disease)    The current medical regimen is effective;  continue present plan and medications.       Relevant Medications   amLODipine (NORVASC) 10 MG tablet   benazepril (LOTENSIN) 40 MG tablet   carvedilol (COREG) 25 MG tablet    lovastatin (MEVACOR) 40 MG tablet   Hypertension    The current medical regimen is effective;  continue present plan and medications.       Relevant Medications   amLODipine (NORVASC) 10 MG tablet   benazepril (LOTENSIN) 40 MG tablet   carvedilol (COREG) 25 MG tablet   lovastatin (MEVACOR) 40 MG tablet   Other Relevant Orders   CBC with Differential/Platelet   Comprehensive metabolic panel   Lipid panel   Urinalysis, Routine w reflex microscopic     Endocrine   Hypothyroidism    The current medical regimen is effective;  continue present plan and medications.       Relevant Medications   carvedilol (COREG) 25 MG tablet   levothyroxine (SYNTHROID, LEVOTHROID) 50 MCG tablet   Other Relevant Orders   CBC with Differential/Platelet   Comprehensive metabolic panel   TSH     Genitourinary   CKD (chronic kidney disease), stage II   Relevant Orders   CBC with Differential/Platelet   Comprehensive metabolic panel   Lipid panel   Urinalysis, Routine w reflex microscopic   BPH (benign prostatic hyperplasia)   Relevant Orders   PSA     Other   Hyperlipidemia    The current medical regimen is effective;  continue present plan and medications.       Relevant Medications   amLODipine (NORVASC) 10 MG tablet   benazepril (LOTENSIN) 40 MG tablet   carvedilol (COREG) 25 MG tablet   lovastatin (MEVACOR) 40 MG tablet   Other Relevant Orders   CBC with Differential/Platelet   Comprehensive metabolic panel   Lipid panel   Urinalysis, Routine w reflex microscopic   Gout    The current medical regimen is effective;  continue present plan and medications.       Relevant Medications   allopurinol (ZYLOPRIM) 300 MG tablet   Other Relevant Orders   Uric acid   BMI 38.0-38.9,adult    Discuss wt loss       Other Visit Diagnoses    Annual physical exam    -  Primary   Relevant Orders   CBC with Differential/Platelet   Comprehensive metabolic panel   Lipid panel   PSA    TSH   Urinalysis, Routine w reflex microscopic   Colon cancer screening       Need for influenza vaccination       Prostate cancer screening       Relevant Orders   PSA   Thyroid disorder screen       Relevant Orders   TSH       Follow up plan: Return in about 6 months (around 04/18/2017) for BMP,  Lipids, ALT, AST.

## 2016-10-19 NOTE — Assessment & Plan Note (Signed)
Discuss wt loss 

## 2016-10-20 LAB — LIPID PANEL
CHOLESTEROL TOTAL: 154 mg/dL (ref 100–199)
Chol/HDL Ratio: 4.1 ratio units (ref 0.0–5.0)
HDL: 38 mg/dL — AB (ref >39)
LDL Calculated: 95 mg/dL (ref 0–99)
TRIGLYCERIDES: 107 mg/dL (ref 0–149)
VLDL Cholesterol Cal: 21 mg/dL (ref 5–40)

## 2016-10-20 LAB — CBC WITH DIFFERENTIAL/PLATELET
BASOS: 1 %
Basophils Absolute: 0.1 10*3/uL (ref 0.0–0.2)
EOS (ABSOLUTE): 0.2 10*3/uL (ref 0.0–0.4)
Eos: 4 %
Hematocrit: 43.3 % (ref 37.5–51.0)
Hemoglobin: 15.2 g/dL (ref 13.0–17.7)
IMMATURE GRANULOCYTES: 0 %
Immature Grans (Abs): 0 10*3/uL (ref 0.0–0.1)
Lymphocytes Absolute: 2 10*3/uL (ref 0.7–3.1)
Lymphs: 33 %
MCH: 29.4 pg (ref 26.6–33.0)
MCHC: 35.1 g/dL (ref 31.5–35.7)
MCV: 84 fL (ref 79–97)
MONOS ABS: 0.5 10*3/uL (ref 0.1–0.9)
Monocytes: 8 %
NEUTROS PCT: 54 %
Neutrophils Absolute: 3.4 10*3/uL (ref 1.4–7.0)
PLATELETS: 221 10*3/uL (ref 150–379)
RBC: 5.17 x10E6/uL (ref 4.14–5.80)
RDW: 14.4 % (ref 12.3–15.4)
WBC: 6.2 10*3/uL (ref 3.4–10.8)

## 2016-10-20 LAB — COMPREHENSIVE METABOLIC PANEL
A/G RATIO: 1.4 (ref 1.2–2.2)
ALK PHOS: 54 IU/L (ref 39–117)
ALT: 18 IU/L (ref 0–44)
AST: 19 IU/L (ref 0–40)
Albumin: 4.1 g/dL (ref 3.6–4.8)
BILIRUBIN TOTAL: 0.5 mg/dL (ref 0.0–1.2)
BUN/Creatinine Ratio: 16 (ref 10–24)
BUN: 20 mg/dL (ref 8–27)
CALCIUM: 9.5 mg/dL (ref 8.6–10.2)
CHLORIDE: 104 mmol/L (ref 96–106)
CO2: 20 mmol/L (ref 18–29)
Creatinine, Ser: 1.29 mg/dL — ABNORMAL HIGH (ref 0.76–1.27)
GFR calc Af Amer: 66 mL/min/{1.73_m2} (ref >59)
GFR calc non Af Amer: 57 mL/min/{1.73_m2} — ABNORMAL LOW (ref >59)
Globulin, Total: 2.9 g/dL (ref 1.5–4.5)
Glucose: 109 mg/dL — ABNORMAL HIGH (ref 65–99)
POTASSIUM: 4.5 mmol/L (ref 3.5–5.2)
Sodium: 141 mmol/L (ref 134–144)
Total Protein: 7 g/dL (ref 6.0–8.5)

## 2016-10-20 LAB — URIC ACID: Uric Acid: 7.4 mg/dL (ref 3.7–8.6)

## 2016-10-20 LAB — PSA: Prostate Specific Ag, Serum: 0.9 ng/mL (ref 0.0–4.0)

## 2016-10-20 LAB — TSH: TSH: 3.87 u[IU]/mL (ref 0.450–4.500)

## 2016-10-23 ENCOUNTER — Encounter: Payer: Self-pay | Admitting: Family Medicine

## 2016-10-26 DIAGNOSIS — I10 Essential (primary) hypertension: Secondary | ICD-10-CM | POA: Diagnosis not present

## 2016-10-26 DIAGNOSIS — I2510 Atherosclerotic heart disease of native coronary artery without angina pectoris: Secondary | ICD-10-CM | POA: Diagnosis not present

## 2016-10-26 DIAGNOSIS — E782 Mixed hyperlipidemia: Secondary | ICD-10-CM | POA: Diagnosis not present

## 2016-10-26 DIAGNOSIS — Z9861 Coronary angioplasty status: Secondary | ICD-10-CM | POA: Diagnosis not present

## 2016-10-31 ENCOUNTER — Telehealth: Payer: Self-pay | Admitting: Family Medicine

## 2016-10-31 NOTE — Telephone Encounter (Signed)
Phone call Discussed with patient insurance doesn't cover Cologuard. Patient wants to defer colonoscopy until next year.

## 2016-10-31 NOTE — Telephone Encounter (Signed)
Please see message from front desk. Assume pt is speaking of cologuard.

## 2016-10-31 NOTE — Telephone Encounter (Signed)
Call pt 

## 2016-12-21 ENCOUNTER — Other Ambulatory Visit: Payer: Self-pay | Admitting: Family Medicine

## 2016-12-21 DIAGNOSIS — M1000 Idiopathic gout, unspecified site: Secondary | ICD-10-CM

## 2017-02-23 DIAGNOSIS — R079 Chest pain, unspecified: Secondary | ICD-10-CM | POA: Diagnosis not present

## 2017-03-02 DIAGNOSIS — I2510 Atherosclerotic heart disease of native coronary artery without angina pectoris: Secondary | ICD-10-CM | POA: Diagnosis not present

## 2017-03-02 DIAGNOSIS — I10 Essential (primary) hypertension: Secondary | ICD-10-CM | POA: Diagnosis not present

## 2017-03-02 DIAGNOSIS — E782 Mixed hyperlipidemia: Secondary | ICD-10-CM | POA: Diagnosis not present

## 2017-03-16 ENCOUNTER — Other Ambulatory Visit: Payer: Self-pay | Admitting: Family Medicine

## 2017-03-21 ENCOUNTER — Other Ambulatory Visit: Payer: Self-pay | Admitting: Family Medicine

## 2017-03-26 ENCOUNTER — Encounter: Payer: Self-pay | Admitting: Family Medicine

## 2017-04-02 ENCOUNTER — Other Ambulatory Visit: Payer: Self-pay | Admitting: Family Medicine

## 2017-04-02 NOTE — Telephone Encounter (Signed)
Last OV: 10/19/16 Next OV: 06/25/17  Lab Results  Component Value Date   TSH 3.870 10/19/2016

## 2017-04-19 ENCOUNTER — Ambulatory Visit: Payer: BLUE CROSS/BLUE SHIELD | Admitting: Family Medicine

## 2017-06-08 DIAGNOSIS — I10 Essential (primary) hypertension: Secondary | ICD-10-CM | POA: Diagnosis not present

## 2017-06-08 DIAGNOSIS — E782 Mixed hyperlipidemia: Secondary | ICD-10-CM | POA: Diagnosis not present

## 2017-06-08 DIAGNOSIS — I2510 Atherosclerotic heart disease of native coronary artery without angina pectoris: Secondary | ICD-10-CM | POA: Diagnosis not present

## 2017-06-14 ENCOUNTER — Other Ambulatory Visit: Payer: Self-pay | Admitting: Unknown Physician Specialty

## 2017-06-25 ENCOUNTER — Ambulatory Visit: Payer: BLUE CROSS/BLUE SHIELD | Admitting: Family Medicine

## 2017-06-26 ENCOUNTER — Ambulatory Visit: Payer: BLUE CROSS/BLUE SHIELD | Admitting: Family Medicine

## 2017-07-12 ENCOUNTER — Ambulatory Visit (INDEPENDENT_AMBULATORY_CARE_PROVIDER_SITE_OTHER): Payer: BLUE CROSS/BLUE SHIELD | Admitting: Family Medicine

## 2017-07-12 ENCOUNTER — Other Ambulatory Visit: Payer: Self-pay | Admitting: Family Medicine

## 2017-07-12 VITALS — HR 58

## 2017-07-12 DIAGNOSIS — T7840XA Allergy, unspecified, initial encounter: Secondary | ICD-10-CM | POA: Diagnosis not present

## 2017-07-12 DIAGNOSIS — I2583 Coronary atherosclerosis due to lipid rich plaque: Secondary | ICD-10-CM

## 2017-07-12 DIAGNOSIS — I2510 Atherosclerotic heart disease of native coronary artery without angina pectoris: Secondary | ICD-10-CM | POA: Diagnosis not present

## 2017-07-12 DIAGNOSIS — E785 Hyperlipidemia, unspecified: Secondary | ICD-10-CM

## 2017-07-12 DIAGNOSIS — I10 Essential (primary) hypertension: Secondary | ICD-10-CM

## 2017-07-12 DIAGNOSIS — M1000 Idiopathic gout, unspecified site: Secondary | ICD-10-CM

## 2017-07-12 MED ORDER — ATORVASTATIN CALCIUM 40 MG PO TABS: 40.0000 mg | ORAL_TABLET | Freq: Every day | ORAL | 4 refills | Status: DC

## 2017-07-12 NOTE — Assessment & Plan Note (Signed)
The current medical regimen is effective;  continue present plan and medications.  

## 2017-07-12 NOTE — Assessment & Plan Note (Addendum)
Cholesterol still too elevated on lovastatin will discontinue begin atorvastatin 40 mg 1 a day recheck lipids one month. Discussed diet and weight loss

## 2017-07-12 NOTE — Assessment & Plan Note (Signed)
Discuss possible allergy type symptoms with patient's Will try Claritin or Allegra on a faithful basis if it doesn't keep head symptoms away. If no relief will consider ear nose and throat referral and possibly imaging.

## 2017-07-12 NOTE — Progress Notes (Signed)
Pulse (!) 58   SpO2 97%    Subjective:    Patient ID: Ricky Melton, male    DOB: May 20, 1951, 66 y.o.   MRN: [id number]  HPI: Ricky Melton is a 66 y.o. male  Chief Complaint  Patient presents with  . Follow-up  . Hypertension  . Hyperlipidemia   Patient follow-up blood pressure cholesterol all in all doing well  no issues with medications . Taking medications faithfully without problems. Patient is concerned as was during last visit of possible allergy type symptoms has some pressure discomfort-type sensation in the left scalp area.'s noticed that by wearing a mask when mowing doesn't get that pressure type sensation and it comes on when going if he doesn't. The symptoms also come on sometimes when not mowing. Takes Benadryl from time to time when he doesn't want to be stopped up but that seems to help some. Reviewed patient's cholesterol doing well on Crestor but unable to take due to cost. We'll switch to lovastatin and cholesterol came of. Patient's done better with trying to diet but not successful in losing weight. Blood pressure doing well no issues with medications taken faithfully. Gout also doing well.  Relevant past medical, surgical, family and social history reviewed and updated as indicated. Interim medical history since our last visit reviewed. Allergies and medications reviewed and updated.  Review of Systems  Constitutional: Negative.   Respiratory: Negative.   Cardiovascular: Negative.     Per HPI unless specifically indicated above     Objective:    Pulse (!) 58   SpO2 97%   Wt Readings from Last 3 Encounters:  10/19/16 248 lb (112.5 kg)  04/13/16 241 lb (109.3 kg)  10/14/15 238 lb (108 kg)    Physical Exam  Constitutional: He is oriented to person, place, and time. He appears well-developed and well-nourished.  HENT:  Head: Normocephalic and atraumatic.  Right Ear: External ear normal.  Left Ear: External ear normal.  Nose: Nose normal.    Mouth/Throat: Oropharynx is clear and moist.  Eyes: Conjunctivae and EOM are normal.  Neck: Normal range of motion.  Cardiovascular: Normal rate, regular rhythm and normal heart sounds.   Pulmonary/Chest: Effort normal and breath sounds normal.  Musculoskeletal: Normal range of motion.  Lymphadenopathy:    He has no cervical adenopathy.  Neurological: He is alert and oriented to person, place, and time.  Skin: No erythema.  Psychiatric: He has a normal mood and affect. His behavior is normal. Judgment and thought content normal.    Results for orders placed or performed in visit on 10/19/16  CBC with Differential/Platelet  Result Value Ref Range   WBC 6.2 3.4 - 10.8 x10E3/uL   RBC 5.17 4.14 - 5.80 x10E6/uL   Hemoglobin 15.2 13.0 - 17.7 g/dL   Hematocrit 43.3 37.5 - 51.0 %   MCV 84 79 - 97 fL   MCH 29.4 26.6 - 33.0 pg   MCHC 35.1 31.5 - 35.7 g/dL   RDW 14.4 12.3 - 15.4 %   Platelets 221 150 - 379 x10E3/uL   Neutrophils 54 Not Estab. %   Lymphs 33 Not Estab. %   Monocytes 8 Not Estab. %   Eos 4 Not Estab. %   Basos 1 Not Estab. %   Neutrophils Absolute 3.4 1.4 - 7.0 x10E3/uL   Lymphocytes Absolute 2.0 0.7 - 3.1 x10E3/uL   Monocytes Absolute 0.5 0.1 - 0.9 x10E3/uL   EOS (ABSOLUTE) 0.2 0.0 - 0.4 x10E3/uL  Basophils Absolute 0.1 0.0 - 0.2 x10E3/uL   Immature Granulocytes 0 Not Estab. %   Immature Grans (Abs) 0.0 0.0 - 0.1 x10E3/uL  Comprehensive metabolic panel  Result Value Ref Range   Glucose 109 (H) 65 - 99 mg/dL   BUN 20 8 - 27 mg/dL   Creatinine, Ser 1.29 (H) 0.76 - 1.27 mg/dL   GFR calc non Af Amer 57 (L) >59 mL/min/1.73   GFR calc Af Amer 66 >59 mL/min/1.73   BUN/Creatinine Ratio 16 10 - 24   Sodium 141 134 - 144 mmol/L   Potassium 4.5 3.5 - 5.2 mmol/L   Chloride 104 96 - 106 mmol/L   CO2 20 18 - 29 mmol/L   Calcium 9.5 8.6 - 10.2 mg/dL   Total Protein 7.0 6.0 - 8.5 g/dL   Albumin 4.1 3.6 - 4.8 g/dL   Globulin, Total 2.9 1.5 - 4.5 g/dL   Albumin/Globulin  Ratio 1.4 1.2 - 2.2   Bilirubin Total 0.5 0.0 - 1.2 mg/dL   Alkaline Phosphatase 54 39 - 117 IU/L   AST 19 0 - 40 IU/L   ALT 18 0 - 44 IU/L  Lipid panel  Result Value Ref Range   Cholesterol, Total 154 100 - 199 mg/dL   Triglycerides 107 0 - 149 mg/dL   HDL 38 (L) >39 mg/dL   VLDL Cholesterol Cal 21 5 - 40 mg/dL   LDL Calculated 95 0 - 99 mg/dL   Chol/HDL Ratio 4.1 0.0 - 5.0 ratio units  PSA  Result Value Ref Range   Prostate Specific Ag, Serum 0.9 0.0 - 4.0 ng/mL  TSH  Result Value Ref Range   TSH 3.870 0.450 - 4.500 uIU/mL  Urinalysis, Routine w reflex microscopic  Result Value Ref Range   Specific Gravity, UA 1.015 1.005 - 1.030   pH, UA 6.5 5.0 - 7.5   Color, UA Yellow Yellow   Appearance Ur Clear Clear   Leukocytes, UA Negative Negative   Protein, UA 2+ (A) Negative/Trace   Glucose, UA Negative Negative   Ketones, UA Negative Negative   RBC, UA 1+ (A) Negative   Bilirubin, UA Negative Negative   Urobilinogen, Ur 0.2 0.2 - 1.0 mg/dL   Nitrite, UA Negative Negative  Uric acid  Result Value Ref Range   Uric Acid 7.4 3.7 - 8.6 mg/dL      Assessment & Plan:   Problem List Items Addressed This Visit      Cardiovascular and Mediastinum   CAD (coronary artery disease)    The current medical regimen is effective;  continue present plan and medications.       Relevant Medications   atorvastatin (LIPITOR) 40 MG tablet   Hypertension    The current medical regimen is effective;  continue present plan and medications.       Relevant Medications   atorvastatin (LIPITOR) 40 MG tablet   Other Relevant Orders   Basic metabolic panel   LP+ALT+AST Piccolo, Waived     Other   Hyperlipidemia - Primary    Cholesterol still too elevated on lovastatin will discontinue begin atorvastatin 40 mg 1 a day recheck lipids one month. Discussed diet and weight loss      Relevant Medications   atorvastatin (LIPITOR) 40 MG tablet   Other Relevant Orders   Basic metabolic panel    LP+ALT+AST Piccolo, Waived   Allergy    Discuss possible allergy type symptoms with patient's Will try Claritin or Allegra on a faithful basis if  it doesn't keep head symptoms away. If no relief will consider ear nose and throat referral and possibly imaging.          Follow up plan: Return in about 4 weeks (around 08/09/2017).

## 2017-07-13 LAB — LP+ALT+AST PICCOLO, WAIVED
ALT (SGPT) PICCOLO, WAIVED: 17 U/L (ref 10–47)
AST (SGOT) PICCOLO, WAIVED: 34 U/L (ref 11–38)
CHOLESTEROL PICCOLO, WAIVED: 161 mg/dL (ref <200)
Chol/HDL Ratio Piccolo,Waive: 4.1 mg/dL
HDL CHOL PICCOLO, WAIVED: 40 mg/dL — AB (ref >59)
LDL Chol Calc Piccolo Waived: 94 mg/dL (ref <100)
TRIGLYCERIDES PICCOLO,WAIVED: 135 mg/dL (ref <150)
VLDL CHOL CALC PICCOLO,WAIVE: 27 mg/dL (ref <30)

## 2017-07-13 LAB — BASIC METABOLIC PANEL
BUN / CREAT RATIO: 15 (ref 10–24)
BUN: 22 mg/dL (ref 8–27)
CALCIUM: 9.6 mg/dL (ref 8.6–10.2)
CO2: 25 mmol/L (ref 20–29)
CREATININE: 1.42 mg/dL — AB (ref 0.76–1.27)
Chloride: 103 mmol/L (ref 96–106)
GFR calc Af Amer: 59 mL/min/{1.73_m2} — ABNORMAL LOW (ref >59)
GFR calc non Af Amer: 51 mL/min/{1.73_m2} — ABNORMAL LOW (ref >59)
GLUCOSE: 107 mg/dL — AB (ref 65–99)
Potassium: 5.1 mmol/L (ref 3.5–5.2)
Sodium: 141 mmol/L (ref 134–144)

## 2017-08-14 ENCOUNTER — Ambulatory Visit (INDEPENDENT_AMBULATORY_CARE_PROVIDER_SITE_OTHER): Payer: BLUE CROSS/BLUE SHIELD | Admitting: Family Medicine

## 2017-08-14 ENCOUNTER — Encounter: Payer: Self-pay | Admitting: Family Medicine

## 2017-08-14 VITALS — BP 146/72 | HR 59 | Temp 98.1°F | Wt 250.0 lb

## 2017-08-14 DIAGNOSIS — I10 Essential (primary) hypertension: Secondary | ICD-10-CM

## 2017-08-14 DIAGNOSIS — E785 Hyperlipidemia, unspecified: Secondary | ICD-10-CM | POA: Diagnosis not present

## 2017-08-14 DIAGNOSIS — I2510 Atherosclerotic heart disease of native coronary artery without angina pectoris: Secondary | ICD-10-CM | POA: Diagnosis not present

## 2017-08-14 DIAGNOSIS — I2583 Coronary atherosclerosis due to lipid rich plaque: Secondary | ICD-10-CM | POA: Diagnosis not present

## 2017-08-14 LAB — LP+ALT+AST PICCOLO, WAIVED
ALT (SGPT) PICCOLO, WAIVED: 23 U/L (ref 10–47)
AST (SGOT) PICCOLO, WAIVED: 31 U/L (ref 11–38)
CHOL/HDL RATIO PICCOLO,WAIVE: 4 mg/dL
Cholesterol Piccolo, Waived: 164 mg/dL (ref <200)
HDL CHOL PICCOLO, WAIVED: 41 mg/dL — AB (ref >59)
LDL Chol Calc Piccolo Waived: 95 mg/dL (ref <100)
TRIGLYCERIDES PICCOLO,WAIVED: 143 mg/dL (ref <150)
VLDL Chol Calc Piccolo,Waive: 29 mg/dL (ref <30)

## 2017-08-14 MED ORDER — HYDROCHLOROTHIAZIDE 25 MG PO TABS: 25.0000 mg | ORAL_TABLET | Freq: Every day | ORAL | 3 refills | Status: DC

## 2017-08-14 MED ORDER — PRAVASTATIN SODIUM 40 MG PO TABS: 40.0000 mg | ORAL_TABLET | Freq: Every day | ORAL | 4 refills | Status: DC

## 2017-08-14 NOTE — Assessment & Plan Note (Signed)
Reviewed high cholesterol and medication inolerance will try pravastatin 40 mg if not will try low-dose medications w Zetia.

## 2017-08-14 NOTE — Assessment & Plan Note (Signed)
Reviewed risk factor modification with patient and will continue to press on controlling cholesterol.

## 2017-08-14 NOTE — Progress Notes (Signed)
BP (!) 146/72 (BP Location: Left Arm)   Pulse (!) 59   Temp 98.1 F (36.7 C) (Oral)   Wt 250 lb (113.4 kg)   SpO2 98%   BMI 38.55 kg/m    Subjective:    Patient ID: Ricky Melton, male    DOB: 1950/11/29, 66 y.o.   MRN: [id number]  HPI: Ricky Melton is a 66 y.o. male  Chief Complaint  Patient presents with  . Follow-up  . Hyperlipidemia  . Sore Throat    Started yesterday  patient with multiple issues some sore throat and allergy drainage takes allergy medicines but having a little extra that just started. Patient was some confusion over cholesterol on review has been on Crestor which worked well but was $50 a month. Others atorvastatin lovastatin  Caused leg cramps. Patient also taking 1 Fish oil tat a day.  Blood pressure also elevated  And on review has been elevated.  Further review and question whether patient taking Lasix which he says he is not taking.  Relevant past medical, surgical, family and social history reviewed and updated as indicated. Interim medical history since our last visit reviewed. Allergies and medications reviewed and updated.  Review of Systems  Constitutional: Negative.   Respiratory: Negative.   Cardiovascular: Negative.     Per HPI unless specifically indicated above     Objective:    BP (!) 146/72 (BP Location: Left Arm)   Pulse (!) 59   Temp 98.1 F (36.7 C) (Oral)   Wt 250 lb (113.4 kg)   SpO2 98%   BMI 38.55 kg/m   Wt Readings from Last 3 Encounters:  08/14/17 250 lb (113.4 kg)  10/19/16 248 lb (112.5 kg)  04/13/16 241 lb (109.3 kg)    Physical Exam  Constitutional: He is oriented to person, place, and time. He appears well-developed and well-nourished.  HENT:  Head: Normocephalic and atraumatic.  Eyes: Conjunctivae and EOM are normal.  Neck: Normal range of motion.  Cardiovascular: Normal rate, regular rhythm and normal heart sounds.  Pulmonary/Chest: Effort normal and breath sounds normal.  Musculoskeletal:  Normal range of motion.  Neurological: He is alert and oriented to person, place, and time.  Skin: No erythema.  Psychiatric: He has a normal mood and affect. His behavior is normal. Judgment and thought content normal.    Results for orders placed or performed in visit on 63/14/97  Basic metabolic panel  Result Value Ref Range   Glucose 107 (H) 65 - 99 mg/dL   BUN 22 8 - 27 mg/dL   Creatinine, Ser 1.42 (H) 0.76 - 1.27 mg/dL   GFR calc non Af Amer 51 (L) >59 mL/min/1.73   GFR calc Af Amer 59 (L) >59 mL/min/1.73   BUN/Creatinine Ratio 15 10 - 24   Sodium 141 134 - 144 mmol/L   Potassium 5.1 3.5 - 5.2 mmol/L   Chloride 103 96 - 106 mmol/L   CO2 25 20 - 29 mmol/L   Calcium 9.6 8.6 - 10.2 mg/dL  LP+ALT+AST Piccolo, Waived  Result Value Ref Range   ALT (SGPT) Piccolo, Waived 17 10 - 47 U/L   AST (SGOT) Piccolo, Waived 34 11 - 38 U/L   Cholesterol Piccolo, Waived 161 <200 mg/dL   HDL Chol Piccolo, Waived 40 (L) >59 mg/dL   Triglycerides Piccolo,Waived 135 <150 mg/dL   Chol/HDL Ratio Piccolo,Waive 4.1 mg/dL   LDL Chol Calc Piccolo Waived 94 <100 mg/dL   VLDL Chol Calc Piccolo,Waive 27 <30  mg/dL      Assessment & Plan:   Problem List Items Addressed This Visit      Cardiovascular and Mediastinum   CAD (coronary artery disease)    Reviewed risk factor modification with patient and will continue to press on controlling cholesterol.      Relevant Medications   pravastatin (PRAVACHOL) 40 MG tablet   hydrochlorothiazide (HYDRODIURIL) 25 MG tablet   Hypertension - Primary    Poor control will add hydrochlorothiazide 25 mg 1 a day.      Relevant Medications   pravastatin (PRAVACHOL) 40 MG tablet   hydrochlorothiazide (HYDRODIURIL) 25 MG tablet     Other   Hyperlipidemia    Reviewed high cholesterol and medication inolerance will try pravastatin 40 mg if not will try low-dose medications w Zetia.      Relevant Medications   pravastatin (PRAVACHOL) 40 MG tablet    hydrochlorothiazide (HYDRODIURIL) 25 MG tablet   Other Relevant Orders   LP+ALT+AST Piccolo, Waived       Follow up plan: Return in about 3 months (around 11/12/2017) for Physical Exam.

## 2017-08-14 NOTE — Assessment & Plan Note (Signed)
Poor control will add hydrochlorothiazide 25 mg 1 a day.

## 2017-10-25 ENCOUNTER — Ambulatory Visit (INDEPENDENT_AMBULATORY_CARE_PROVIDER_SITE_OTHER): Payer: BLUE CROSS/BLUE SHIELD | Admitting: Family Medicine

## 2017-10-25 ENCOUNTER — Ambulatory Visit (INDEPENDENT_AMBULATORY_CARE_PROVIDER_SITE_OTHER): Payer: BLUE CROSS/BLUE SHIELD

## 2017-10-25 ENCOUNTER — Encounter: Payer: Self-pay | Admitting: Family Medicine

## 2017-10-25 VITALS — BP 150/80 | HR 69 | Temp 98.2°F | Resp 17 | Ht 67.0 in | Wt 246.6 lb

## 2017-10-25 VITALS — BP 160/88 | HR 69 | Ht 67.0 in | Wt 246.6 lb

## 2017-10-25 DIAGNOSIS — I10 Essential (primary) hypertension: Secondary | ICD-10-CM | POA: Diagnosis not present

## 2017-10-25 DIAGNOSIS — N400 Enlarged prostate without lower urinary tract symptoms: Secondary | ICD-10-CM | POA: Diagnosis not present

## 2017-10-25 DIAGNOSIS — E785 Hyperlipidemia, unspecified: Secondary | ICD-10-CM | POA: Diagnosis not present

## 2017-10-25 DIAGNOSIS — Z7189 Other specified counseling: Secondary | ICD-10-CM | POA: Insufficient documentation

## 2017-10-25 DIAGNOSIS — E039 Hypothyroidism, unspecified: Secondary | ICD-10-CM | POA: Diagnosis not present

## 2017-10-25 DIAGNOSIS — I2583 Coronary atherosclerosis due to lipid rich plaque: Secondary | ICD-10-CM

## 2017-10-25 DIAGNOSIS — I2510 Atherosclerotic heart disease of native coronary artery without angina pectoris: Secondary | ICD-10-CM | POA: Diagnosis not present

## 2017-10-25 DIAGNOSIS — M1000 Idiopathic gout, unspecified site: Secondary | ICD-10-CM | POA: Diagnosis not present

## 2017-10-25 DIAGNOSIS — Z23 Encounter for immunization: Secondary | ICD-10-CM

## 2017-10-25 DIAGNOSIS — Z0000 Encounter for general adult medical examination without abnormal findings: Secondary | ICD-10-CM

## 2017-10-25 LAB — URINALYSIS, ROUTINE W REFLEX MICROSCOPIC
Bilirubin, UA: NEGATIVE
GLUCOSE, UA: NEGATIVE
Ketones, UA: NEGATIVE
Leukocytes, UA: NEGATIVE
NITRITE UA: NEGATIVE
Specific Gravity, UA: 1.015 (ref 1.005–1.030)
Urobilinogen, Ur: 0.2 mg/dL (ref 0.2–1.0)
pH, UA: 7.5 (ref 5.0–7.5)

## 2017-10-25 LAB — MICROSCOPIC EXAMINATION

## 2017-10-25 MED ORDER — BENAZEPRIL HCL 40 MG PO TABS: 40.0000 mg | ORAL_TABLET | Freq: Every day | ORAL | 4 refills | Status: DC

## 2017-10-25 MED ORDER — PRAVASTATIN SODIUM 40 MG PO TABS: 40.0000 mg | ORAL_TABLET | Freq: Every day | ORAL | 4 refills | Status: DC

## 2017-10-25 MED ORDER — ALLOPURINOL 300 MG PO TABS: 300.0000 mg | ORAL_TABLET | Freq: Every day | ORAL | 4 refills | Status: DC

## 2017-10-25 MED ORDER — AMLODIPINE BESYLATE 10 MG PO TABS: 10.0000 mg | ORAL_TABLET | Freq: Every day | ORAL | 4 refills | Status: DC

## 2017-10-25 MED ORDER — LEVOTHYROXINE SODIUM 50 MCG PO TABS: 50.0000 ug | ORAL_TABLET | Freq: Every day | ORAL | 4 refills | Status: DC

## 2017-10-25 MED ORDER — CARVEDILOL 25 MG PO TABS: 25.0000 mg | ORAL_TABLET | Freq: Two times a day (BID) | ORAL | 4 refills | Status: DC

## 2017-10-25 MED ORDER — HYDROCHLOROTHIAZIDE 25 MG PO TABS: 25.0000 mg | ORAL_TABLET | Freq: Every day | ORAL | 4 refills | Status: DC

## 2017-10-25 NOTE — Assessment & Plan Note (Signed)
No meds

## 2017-10-25 NOTE — Progress Notes (Signed)
BP (!) 160/88   Pulse 69   Ht 5\' 7"  (1.702 m)   Wt 246 lb 9.6 oz (111.9 kg)   SpO2 98%   BMI 38.62 kg/m    Subjective:    Patient ID: Ricky Melton, male    DOB: [birth year], 67 y.o.   MRN: [id number]  HPI: Ricky Melton is a 67 y.o. male  Chief Complaint  Patient presents with  . Annual Exam  Patient all in all doing okay some confusion on blood pressure medicine.  Patient's been having some side effects of getting little lightheaded dizzy with standing. Has decreased benazepril from 40 mg to 20 mg but cannot cut pill in half and so has been taking every other day.  Is also taking furosemide 20 mg prescribed by Dr. Humphrey Rolls and hydrochlorothiazide 25 mg prescribed by me. Otherwise doing well taking other medications without problems or issues is taking allopurinol 300 mg every other day with no gout symptoms. No chest pain chest tightness no ankle edema or shortness of breath PND.  Patient has not taken blood pressure medication today.  Relevant past medical, surgical, family and social history reviewed and updated as indicated. Interim medical history since our last visit reviewed. Allergies and medications reviewed and updated.  Review of Systems  Constitutional: Negative.   HENT: Negative.   Eyes: Negative.   Respiratory: Negative.   Cardiovascular: Negative.   Gastrointestinal: Negative.   Endocrine: Negative.   Genitourinary: Negative.   Musculoskeletal: Negative.   Skin: Negative.   Allergic/Immunologic: Negative.   Neurological: Negative.   Hematological: Negative.   Psychiatric/Behavioral: Negative.     Per HPI unless specifically indicated above     Objective:    BP (!) 160/88   Pulse 69   Ht 5\' 7"  (1.702 m)   Wt 246 lb 9.6 oz (111.9 kg)   SpO2 98%   BMI 38.62 kg/m   Wt Readings from Last 3 Encounters:  10/25/17 246 lb 9.6 oz (111.9 kg)  10/25/17 246 lb 9.6 oz (111.9 kg)  08/14/17 250 lb (113.4 kg)    Physical Exam  Constitutional: He is  oriented to person, place, and time. He appears well-developed and well-nourished.  HENT:  Head: Normocephalic and atraumatic.  Right Ear: External ear normal.  Left Ear: External ear normal.  Eyes: Conjunctivae and EOM are normal. Pupils are equal, round, and reactive to light.  Neck: Normal range of motion. Neck supple.  Cardiovascular: Normal rate, regular rhythm, normal heart sounds and intact distal pulses.  Pulmonary/Chest: Effort normal and breath sounds normal.  Abdominal: Soft. Bowel sounds are normal. There is no splenomegaly or hepatomegaly.  Genitourinary: Rectum normal and penis normal.  Genitourinary Comments: BPH changes  Musculoskeletal: Normal range of motion.  Neurological: He is alert and oriented to person, place, and time. He has normal reflexes.  Skin: No rash noted. No erythema.  Psychiatric: He has a normal mood and affect. His behavior is normal. Judgment and thought content normal.    Results for orders placed or performed in visit on 08/14/17  LP+ALT+AST Piccolo, [employer]  Result Value Ref Range   ALT (SGPT) Piccolo, Waived 23 10 - 47 U/L   AST (SGOT) Piccolo, Waived 31 11 - 38 U/L   Cholesterol Piccolo, Waived 164 <200 mg/dL   HDL Chol Piccolo, Waived 41 (L) >59 mg/dL   Triglycerides Piccolo,Waived 143 <150 mg/dL   Chol/HDL Ratio Piccolo,Waive 4.0 mg/dL   LDL Chol Calc Piccolo Waived 95 <100 mg/dL  VLDL Chol Calc Piccolo,Waive 29 <30 mg/dL      Assessment & Plan:   Problem List Items Addressed This Visit      Cardiovascular and Mediastinum   CAD (coronary artery disease)    The current medical regimen is effective;  continue present plan and medications.       Relevant Medications   pravastatin (PRAVACHOL) 40 MG tablet   hydrochlorothiazide (HYDRODIURIL) 25 MG tablet   carvedilol (COREG) 25 MG tablet   benazepril (LOTENSIN) 40 MG tablet   amLODipine (NORVASC) 10 MG tablet   Other Relevant Orders   Comprehensive metabolic panel   CBC with  Differential/Platelet   Urinalysis, Routine w reflex microscopic   Hypertension    No significant change on orthostatic tilt here in the office.  But patient did get lightheaded first standing after his exam. Pressure elevated today but has not taken medications. Will hold furosemide continue HCTZ and potassium. Continue benazepril 40 mg a day Observe blood pressure response lightheaded dizzy response and if continues will need to do further adjustments.       Relevant Medications   pravastatin (PRAVACHOL) 40 MG tablet   hydrochlorothiazide (HYDRODIURIL) 25 MG tablet   carvedilol (COREG) 25 MG tablet   benazepril (LOTENSIN) 40 MG tablet   amLODipine (NORVASC) 10 MG tablet   Other Relevant Orders   Comprehensive metabolic panel   CBC with Differential/Platelet   Urinalysis, Routine w reflex microscopic     Endocrine   Hypothyroidism - Primary    The current medical regimen is effective;  continue present plan and medications.       Relevant Medications   levothyroxine (SYNTHROID, LEVOTHROID) 50 MCG tablet   carvedilol (COREG) 25 MG tablet   Other Relevant Orders   TSH   Urinalysis, Routine w reflex microscopic     Genitourinary   BPH (benign prostatic hyperplasia)    No meds      Relevant Orders   PSA     Other   Hyperlipidemia    The current medical regimen is effective;  continue present plan and medications.       Relevant Medications   pravastatin (PRAVACHOL) 40 MG tablet   hydrochlorothiazide (HYDRODIURIL) 25 MG tablet   carvedilol (COREG) 25 MG tablet   benazepril (LOTENSIN) 40 MG tablet   amLODipine (NORVASC) 10 MG tablet   Other Relevant Orders   Lipid panel   Gout    Patient taking medication every other day for some reason will go back to daily check uric acid today.      Relevant Medications   allopurinol (ZYLOPRIM) 300 MG tablet   Other Relevant Orders   Uric acid   Advanced care planning/counseling discussion    A voluntary discussion  about advance care planning including the explanation and discussion of advance directives was extensively discussed  with the patient.  Explanation about the health care proxy and Living will was reviewed and packet with forms with explanation of how to fill them out was given.    Time spent:   16+ min encounter     Individuals present: pt           Follow up plan: Return in about 4 weeks (around 11/22/2017) for BMP,  check.

## 2017-10-25 NOTE — Assessment & Plan Note (Signed)
The current medical regimen is effective;  continue present plan and medications.  

## 2017-10-25 NOTE — Assessment & Plan Note (Signed)
No significant change on orthostatic tilt here in the office.  But patient did get lightheaded first standing after his exam. Pressure elevated today but has not taken medications. Will hold furosemide continue HCTZ and potassium. Continue benazepril 40 mg a day Observe blood pressure response lightheaded dizzy response and if continues will need to do further adjustments.

## 2017-10-25 NOTE — Progress Notes (Signed)
Subjective:   Ricky Melton is a 67 y.o. male who presents for an Initial Medicare Annual Wellness Visit.  Review of Systems   Cardiac Risk Factors include: dyslipidemia;hypertension;advanced age (>81men, >16 women);male gender;obesity (BMI >30kg/m2)    Objective:    Today's Vitals   10/25/17 0822 10/25/17 0853  BP: (!) 160/88 (!) 150/80  Pulse: 69   Resp: 17   Temp: 98.2 F (36.8 C)   TempSrc: Temporal   Weight: 246 lb 9.6 oz (111.9 kg)   Height: 5\' 7"  (1.702 m)    Body mass index is 38.62 kg/m.  Advanced Directives 10/25/2017  Does Patient Have a Medical Advance Directive? No  Would patient like information on creating a medical advance directive? Yes (MAU/Ambulatory/Procedural Areas - Information given)    Current Medications (verified) Outpatient Encounter Medications as of 10/25/2017  Medication Sig  . allopurinol (ZYLOPRIM) 300 MG tablet TAKE 1 TABLET BY MOUTH DAILY  . amLODipine (NORVASC) 10 MG tablet Take 1 tablet (10 mg total) by mouth daily.  Marland Kitchen aspirin 81 MG tablet Take 81 mg by mouth daily.  . benazepril (LOTENSIN) 40 MG tablet TAKE 1 TABLET (40 MG TOTAL) BY MOUTH DAILY.  . carvedilol (COREG) 25 MG tablet Take 1 tablet (25 mg total) by mouth 2 (two) times daily with a meal.  . clopidogrel (PLAVIX) 75 MG tablet Take 75 mg by mouth daily.  . furosemide (LASIX) 20 MG tablet   . hydrochlorothiazide (HYDRODIURIL) 25 MG tablet Take 1 tablet (25 mg total) by mouth daily.  Marland Kitchen levothyroxine (SYNTHROID, LEVOTHROID) 50 MCG tablet Take 1 tablet (50 mcg total) by mouth daily.  . potassium chloride (K-DUR,KLOR-CON) 10 MEQ tablet Take 10 mEq by mouth daily.  . pravastatin (PRAVACHOL) 40 MG tablet Take 1 tablet (40 mg total) by mouth daily.  Marland Kitchen triamcinolone (NASACORT ALLERGY 24HR) 55 MCG/ACT AERO nasal inhaler Place 2 sprays into the nose daily.   No facility-administered encounter medications on file as of 10/25/2017.     Allergies (verified) Patient has no known  allergies.   History: Past Medical History:  Diagnosis Date  . Benign hematuria   . CAD (coronary artery disease)   . Chronic kidney disease   . Hyperlipidemia   . Hypertension    Past Surgical History:  Procedure Laterality Date  . cardiac stents     x2   Family History  Problem Relation Age of Onset  . Hypertension Mother   . Diabetes Sister   . Diabetes Son    Social History   Socioeconomic History  . Marital status: Married    Spouse name: None  . Number of children: None  . Years of education: None  . Highest education level: None  Social Needs  . Financial resource strain: Not hard at all  . Food insecurity - worry: Never true  . Food insecurity - inability: Never true  . Transportation needs - medical: No  . Transportation needs - non-medical: No  Occupational History  . None  Tobacco Use  . Smoking status: Never Smoker  . Smokeless tobacco: Former Systems developer    Types: Chew  Substance and Sexual Activity  . Alcohol use: No    Alcohol/week: 0.0 oz  . Drug use: No  . Sexual activity: Yes  Other Topics Concern  . None  Social History Narrative  . None   Tobacco Counseling Counseling given: Not Answered   Clinical Intake:  Pre-visit preparation completed: Yes  Pain : No/denies pain  Nutritional Status: BMI > 30  Obese Nutritional Risks: None Diabetes: No  How often do you need to have someone help you when you read instructions, pamphlets, or other written materials from your doctor or pharmacy?: 1 - Never What is the last grade level you completed in school?: 12th grade  Interpreter Needed?: No  Information entered by :: Azalyn Sliwa,LPN   Activities of Daily Living In your present state of health, do you have any difficulty performing the following activities: 10/25/2017  Hearing? Y  Comment recommended hearing specialist - free clinics  Vision? Y  Comment going to eye doctor in the next month   Difficulty concentrating or making  decisions? N  Walking or climbing stairs? N  Dressing or bathing? N  Doing errands, shopping? N  Preparing Food and eating ? N  Using the Toilet? N  In the past six months, have you accidently leaked urine? N  Do you have problems with loss of bowel control? N  Managing your Medications? N  Managing your Finances? N  Housekeeping or managing your Housekeeping? N  Some recent data might be hidden     Immunizations and Health Maintenance Immunization History  Administered Date(s) Administered  . Influenza,inj,Quad PF,6+ Mos 10/14/2015  . Pneumococcal Conjugate-13 04/13/2016  . Pneumococcal Polysaccharide-23 06/07/2006, 10/25/2017  . Td 01/23/2006  . Tdap 04/13/2016  . Zoster 08/21/2011   There are no preventive care reminders to display for this patient.  Patient Care Team: Guadalupe Maple, MD as PCP - General (Family Medicine) Dionisio David, MD as Consulting Physician (Cardiology)  Indicate any recent Medical Services you may have received from other than [facility] providers in the past year (date may be approximate).    Assessment:   This is a routine wellness examination for [facility].  Hearing/Vision screen Vision Screening Comments: Goes to [place] annually  Dietary issues and exercise activities discussed: Current Exercise Habits: The patient does not participate in regular exercise at present, Exercise limited by: None identified  Goals    . DIET - INCREASE WATER INTAKE     Recommend drinking at least 6-8 glasses of water a day       Depression Screen PHQ 2/9 Scores 10/25/2017 08/14/2017 10/14/2015  PHQ - 2 Score 0 0 0  PHQ- 9 Score 0 - -    Fall Risk Fall Risk  10/25/2017 08/14/2017 10/14/2015  Falls in the past year? No No No    Is the patient's home free of loose throw rugs in walkways, pet beds, electrical cords, etc?   yes      Grab bars in the bathroom? no      Handrails on the stairs?   yes      Adequate lighting?   yes  Timed Get Up and Go performed:  Completed in 8 seconds with no use of assistive devices, steady gait. No intervention needed at this time.   Cognitive Function:     6CIT Screen 10/25/2017  What Year? 0 points  What month? 0 points  What time? 0 points  Count back from 20 0 points  Months in reverse 0 points  Repeat phrase 0 points  Total Score 0    Screening Tests Health Maintenance  Topic Date Due  . COLONOSCOPY  10/25/2017 (Originally 10/08/2000)  . TETANUS/TDAP  04/13/2026  . INFLUENZA VACCINE  Completed  . Hepatitis C Screening  Completed  . PNA vac Low Risk Adult  Completed    Qualifies for Shingles Vaccine? Discussed shingrix  vaccine   Cancer Screenings: Lung: Low Dose CT Chest recommended if Age 78-80 years, 30 pack-year currently smoking OR have quit w/in 15years. Patient does not qualify. Colorectal: discussed Cologuard - Mr.Sandler will call if he would like cologuard ordered   Additional Screenings:  Hepatitis B/HIV/Syphillis:HIV completed 10/14/2015 Hepatitis C Screening: completed 10/14/2015      Plan:    I have personally reviewed and addressed the Medicare Annual Wellness questionnaire and have noted the following in the patient's chart:  A. Medical and social history B. Use of alcohol, tobacco or illicit drugs  C. Current medications and supplements D. Functional ability and status E.  Nutritional status F.  Physical activity G. Advance directives H. List of other physicians I.  Hospitalizations, surgeries, and ER visits in previous 12 months J.  [place] such as hearing and vision if needed, cognitive and depression L. Referrals and appointments   In addition, I have reviewed and discussed with patient certain preventive protocols, quality metrics, and best practice recommendations. A written personalized care plan for preventive services as well as general preventive health recommendations were provided to patient.   Signed,  Tyler Aas, LPN Nurse Health  Advisor   Nurse Notes: none

## 2017-10-25 NOTE — Assessment & Plan Note (Signed)
Patient taking medication every other day for some reason will go back to daily check uric acid today.

## 2017-10-25 NOTE — Patient Instructions (Addendum)
Ricky Melton , Thank you for taking time to come for your Medicare Wellness Visit. I appreciate your ongoing commitment to your health goals. Please review the following plan we discussed and let me know if I can assist you in the future.   Screening recommendations/referrals: Colonoscopy: cologuard information given. Please call if you would like to have this ordered Recommended yearly ophthalmology/optometry visit for glaucoma screening and checkup Recommended yearly dental visit for hygiene and checkup  Vaccinations: Influenza vaccine: up to date  Pneumococcal vaccine: completed pneumovax 23 today, series completed  Tdap vaccine: up to date  Shingles vaccine: due, check with your insurance company for coverage   Advanced directives: Advance directive discussed with you today. I have provided a copy for you to complete at home and have notarized. Once this is complete please bring a copy in to our office so we can scan it into your chart.  Conditions/risks identified: Recommend drinking at least 6-8 glasses of water a day   Next appointment: Follow up in one year for your annual wellness exam.   Preventive Care 65 Years and Older, Male Preventive care refers to lifestyle choices and visits with your health care provider that can promote health and wellness. What does preventive care include?  A yearly physical exam. This is also called an annual well check.  Dental exams once or twice a year.  Routine eye exams. Ask your health care provider how often you should have your eyes checked.  Personal lifestyle choices, including:  Daily care of your teeth and gums.  Regular physical activity.  Eating a healthy diet.  Avoiding tobacco and drug use.  Limiting alcohol use.  Practicing safe sex.  Taking low doses of aspirin every day.  Taking vitamin and mineral supplements as recommended by your health care provider. What happens during an annual well check? The services and  screenings done by your health care provider during your annual well check will depend on your age, overall health, lifestyle risk factors, and family history of disease. Counseling  Your health care provider may ask you questions about your:  Alcohol use.  Tobacco use.  Drug use.  Emotional well-being.  Home and relationship well-being.  Sexual activity.  Eating habits.  History of falls.  Memory and ability to understand (cognition).  Work and work Statistician. Screening  You may have the following tests or measurements:  Height, weight, and BMI.  Blood pressure.  Lipid and cholesterol levels. These may be checked every 5 years, or more frequently if you are over 53 years old.  Skin check.  Lung cancer screening. You may have this screening every year starting at age 79 if you have a 30-pack-year history of smoking and currently smoke or have quit within the past 15 years.  Fecal occult blood test (FOBT) of the stool. You may have this test every year starting at age 85.  Flexible sigmoidoscopy or colonoscopy. You may have a sigmoidoscopy every 5 years or a colonoscopy every 10 years starting at age 3.  Prostate cancer screening. Recommendations will vary depending on your family history and other risks.  Hepatitis C blood test.  Hepatitis B blood test.  Sexually transmitted disease (STD) testing.  Diabetes screening. This is done by checking your blood sugar (glucose) after you have not eaten for a while (fasting). You may have this done every 1-3 years.  Abdominal aortic aneurysm (AAA) screening. You may need this if you are a current or former smoker.  Osteoporosis. You  may be screened starting at age 58 if you are at high risk. Talk with your health care provider about your test results, treatment options, and if necessary, the need for more tests. Vaccines  Your health care provider may recommend certain vaccines, such as:  Influenza vaccine. This is  recommended every year.  Tetanus, diphtheria, and acellular pertussis (Tdap, Td) vaccine. You may need a Td booster every 10 years.  Zoster vaccine. You may need this after age 52.  Pneumococcal 13-valent conjugate (PCV13) vaccine. One dose is recommended after age 56.  Pneumococcal polysaccharide (PPSV23) vaccine. One dose is recommended after age 51. Talk to your health care provider about which screenings and vaccines you need and how often you need them. This information is not intended to replace advice given to you by your health care provider. Make sure you discuss any questions you have with your health care provider. Document Released: 09/24/2015 Document Revised: 05/17/2016 Document Reviewed: 06/29/2015 Elsevier Interactive Patient Education  2017 [place] Prevention in the Home Falls can cause injuries. They can happen to people of all ages. There are many things you can do to make your home safe and to help prevent falls. What can I do on the outside of my home?  Regularly fix the edges of walkways and driveways and fix any cracks.  Remove anything that might make you trip as you walk through a door, such as a raised step or threshold.  Trim any bushes or trees on the path to your home.  Use bright outdoor lighting.  Clear any walking paths of anything that might make someone trip, such as rocks or tools.  Regularly check to see if handrails are loose or broken. Make sure that both sides of any steps have handrails.  Any raised decks and porches should have guardrails on the edges.  Have any leaves, snow, or ice cleared regularly.  Use sand or salt on walking paths during winter.  Clean up any spills in your garage right away. This includes oil or grease spills. What can I do in the bathroom?  Use night lights.  Install grab bars by the toilet and in the tub and shower. Do not use towel bars as grab bars.  Use non-skid mats or decals in the tub or  shower.  If you need to sit down in the shower, use a plastic, non-slip stool.  Keep the floor dry. Clean up any water that spills on the floor as soon as it happens.  Remove soap buildup in the tub or shower regularly.  Attach bath mats securely with double-sided non-slip rug tape.  Do not have throw rugs and other things on the floor that can make you trip. What can I do in the bedroom?  Use night lights.  Make sure that you have a light by your bed that is easy to reach.  Do not use any sheets or blankets that are too big for your bed. They should not hang down onto the floor.  Have a firm chair that has side arms. You can use this for support while you get dressed.  Do not have throw rugs and other things on the floor that can make you trip. What can I do in the kitchen?  Clean up any spills right away.  Avoid walking on wet floors.  Keep items that you use a lot in easy-to-reach places.  If you need to reach something above you, use a strong step stool that  has a grab bar.  Keep electrical cords out of the way.  Do not use floor polish or wax that makes floors slippery. If you must use wax, use non-skid floor wax.  Do not have throw rugs and other things on the floor that can make you trip. What can I do with my stairs?  Do not leave any items on the stairs.  Make sure that there are handrails on both sides of the stairs and use them. Fix handrails that are broken or loose. Make sure that handrails are as long as the stairways.  Check any carpeting to make sure that it is firmly attached to the stairs. Fix any carpet that is loose or worn.  Avoid having throw rugs at the top or bottom of the stairs. If you do have throw rugs, attach them to the floor with carpet tape.  Make sure that you have a light switch at the top of the stairs and the bottom of the stairs. If you do not have them, ask someone to add them for you. What else can I do to help prevent  falls?  Wear shoes that:  Do not have high heels.  Have rubber bottoms.  Are comfortable and fit you well.  Are closed at the toe. Do not wear sandals.  If you use a stepladder:  Make sure that it is fully opened. Do not climb a closed stepladder.  Make sure that both sides of the stepladder are locked into place.  Ask someone to hold it for you, if possible.  Clearly mark and make sure that you can see:  Any grab bars or handrails.  First and last steps.  Where the edge of each step is.  Use tools that help you move around (mobility aids) if they are needed. These include:  Canes.  Walkers.  Scooters.  Crutches.  Turn on the lights when you go into a dark area. Replace any light bulbs as soon as they burn out.  Set up your furniture so you have a clear path. Avoid moving your furniture around.  If any of your floors are uneven, fix them.  If there are any pets around you, be aware of where they are.  Review your medicines with your doctor. Some medicines can make you feel dizzy. This can increase your chance of falling. Ask your doctor what other things that you can do to help prevent falls. This information is not intended to replace advice given to you by your health care provider. Make sure you discuss any questions you have with your health care provider. Document Released: 06/24/2009 Document Revised: 02/03/2016 Document Reviewed: 10/02/2014 Elsevier Interactive Patient Education  2017 [place].  Pneumococcal Polysaccharide Vaccine: What You Need to Know 1. Why get vaccinated? Vaccination can protect older adults (and some children and younger adults) from pneumococcal disease. Pneumococcal disease is caused by bacteria that can spread from person to person through close contact. It can cause ear infections, and it can also lead to more serious infections of the:  Lungs (pneumonia),  Blood (bacteremia), and  Covering of the brain and spinal cord  (meningitis). Meningitis can cause deafness and brain damage, and it can be fatal.  Anyone can get pneumococcal disease, but children under 87 years of age, people with certain medical conditions, adults over 80 years of age, and cigarette smokers are at the highest risk. About 18,000 older adults die each year from pneumococcal disease in the [place]. Treatment of pneumococcal  infections with penicillin and other drugs used to be more effective. But some strains of the disease have become resistant to these drugs. This makes prevention of the disease, through vaccination, even more important. 2. Pneumococcal polysaccharide vaccine (PPSV23) Pneumococcal polysaccharide vaccine (PPSV23) protects against 23 types of pneumococcal bacteria. It will not prevent all pneumococcal disease. PPSV23 is recommended for:  All adults 38 years of age and older,  Anyone 2 through 67 years of age with certain long-term health problems,  Anyone 2 through 67 years of age with a weakened immune system,  Adults 69 through 67 years of age who smoke cigarettes or have asthma.  Most people need only one dose of PPSV. A second dose is recommended for certain high-risk groups. People 6 and older should get a dose even if they have gotten one or more doses of the vaccine before they turned 65. Your healthcare provider can give you more information about these recommendations. Most healthy adults develop protection within 2 to 3 weeks of getting the shot. 3. Some people should not get this vaccine  Anyone who has had a life-threatening allergic reaction to PPSV should not get another dose.  Anyone who has a severe allergy to any component of PPSV should not receive it. Tell your provider if you have any severe allergies.  Anyone who is moderately or severely ill when the shot is scheduled may be asked to wait until they recover before getting the vaccine. Someone with a mild illness can usually be  vaccinated.  Children less than 36 years of age should not receive this vaccine.  There is no evidence that PPSV is harmful to either a pregnant woman or to her fetus. However, as a precaution, women who need the vaccine should be vaccinated before becoming pregnant, if possible. 4. Risks of a vaccine reaction With any medicine, including vaccines, there is a chance of side effects. These are usually mild and go away on their own, but serious reactions are also possible. About half of people who get PPSV have mild side effects, such as redness or pain where the shot is given, which go away within about two days. Less than 1 out of 100 people develop a fever, muscle aches, or more severe local reactions. Problems that could happen after any vaccine:  People sometimes faint after a medical procedure, including vaccination. Sitting or lying down for about 15 minutes can help prevent fainting, and injuries caused by a fall. Tell your doctor if you feel dizzy, or have vision changes or ringing in the ears.  Some people get severe pain in the shoulder and have difficulty moving the arm where a shot was given. This happens very rarely.  Any medication can cause a severe allergic reaction. Such reactions from a vaccine are very rare, estimated at about 1 in a million doses, and would happen within a few minutes to a few hours after the vaccination. As with any medicine, there is a very remote chance of a vaccine causing a serious injury or death. The safety of vaccines is always being monitored. For more information, visit: http://www.aguilar.org/ 5. What if there is a serious reaction? What should I look for? Look for anything that concerns you, such as signs of a severe allergic reaction, very high fever, or unusual behavior. Signs of a severe allergic reaction can include hives, swelling of the face and throat, difficulty breathing, a fast heartbeat, dizziness, and weakness. These would usually  start a few minutes to a  few hours after the vaccination. What should I do? If you think it is a severe allergic reaction or other emergency that can't wait, call 9-1-1 or get to the nearest hospital. Otherwise, call your doctor. Afterward, the reaction should be reported to the Vaccine Adverse Event Reporting System (VAERS). Your doctor might file this report, or you can do it yourself through the VAERS web site at www.vaers.SamedayNews.es, or by calling 503 427 8829. VAERS does not give medical advice. 6. How can I learn more?  Ask your doctor. He or she can give you the vaccine package insert or suggest other sources of information.  Call your local or state health department.  Contact the Centers for Disease Control and Prevention (CDC): ? Call 458-803-8265 (1-800-CDC-INFO) or ? Visit CDC's website at http://hunter.com/ CDC Pneumococcal Polysaccharide Vaccine VIS (01/02/14) This information is not intended to replace advice given to you by your health care provider. Make sure you discuss any questions you have with your health care provider. Document Released: 06/25/2006 Document Revised: 05/18/2016 Document Reviewed: 05/18/2016 Elsevier Interactive Patient Education  2017 [employer].

## 2017-10-25 NOTE — Assessment & Plan Note (Signed)
A voluntary discussion about advance care planning including the explanation and discussion of advance directives was extensively discussed  with the patient.  Explanation about the health care proxy and Living will was reviewed and packet with forms with explanation of how to fill them out was given.    Time spent:   16+ min encounter     Individuals present: pt

## 2017-10-26 LAB — LIPID PANEL
CHOL/HDL RATIO: 4.9 ratio (ref 0.0–5.0)
Cholesterol, Total: 176 mg/dL (ref 100–199)
HDL: 36 mg/dL — ABNORMAL LOW (ref >39)
LDL CALC: 108 mg/dL — AB (ref 0–99)
Triglycerides: 159 mg/dL — ABNORMAL HIGH (ref 0–149)
VLDL Cholesterol Cal: 32 mg/dL (ref 5–40)

## 2017-10-26 LAB — COMPREHENSIVE METABOLIC PANEL
A/G RATIO: 1.5 (ref 1.2–2.2)
ALBUMIN: 4.5 g/dL (ref 3.6–4.8)
ALT: 20 IU/L (ref 0–44)
AST: 20 IU/L (ref 0–40)
Alkaline Phosphatase: 59 IU/L (ref 39–117)
BUN / CREAT RATIO: 16 (ref 10–24)
BUN: 23 mg/dL (ref 8–27)
Bilirubin Total: 0.5 mg/dL (ref 0.0–1.2)
CALCIUM: 9.8 mg/dL (ref 8.6–10.2)
CO2: 22 mmol/L (ref 20–29)
CREATININE: 1.46 mg/dL — AB (ref 0.76–1.27)
Chloride: 102 mmol/L (ref 96–106)
GFR, EST AFRICAN AMERICAN: 57 mL/min/{1.73_m2} — AB (ref >59)
GFR, EST NON AFRICAN AMERICAN: 49 mL/min/{1.73_m2} — AB (ref >59)
Globulin, Total: 3 g/dL (ref 1.5–4.5)
Glucose: 97 mg/dL (ref 65–99)
POTASSIUM: 4.8 mmol/L (ref 3.5–5.2)
SODIUM: 141 mmol/L (ref 134–144)
Total Protein: 7.5 g/dL (ref 6.0–8.5)

## 2017-10-26 LAB — CBC WITH DIFFERENTIAL/PLATELET
BASOS ABS: 0.1 10*3/uL (ref 0.0–0.2)
Basos: 1 %
EOS (ABSOLUTE): 0.2 10*3/uL (ref 0.0–0.4)
Eos: 3 %
Hematocrit: 45.6 % (ref 37.5–51.0)
Hemoglobin: 15.2 g/dL (ref 13.0–17.7)
IMMATURE GRANS (ABS): 0 10*3/uL (ref 0.0–0.1)
IMMATURE GRANULOCYTES: 0 %
LYMPHS: 33 %
Lymphocytes Absolute: 2.4 10*3/uL (ref 0.7–3.1)
MCH: 28.5 pg (ref 26.6–33.0)
MCHC: 33.3 g/dL (ref 31.5–35.7)
MCV: 85 fL (ref 79–97)
Monocytes Absolute: 0.5 10*3/uL (ref 0.1–0.9)
Monocytes: 7 %
NEUTROS ABS: 4 10*3/uL (ref 1.4–7.0)
NEUTROS PCT: 56 %
PLATELETS: 239 10*3/uL (ref 150–379)
RBC: 5.34 x10E6/uL (ref 4.14–5.80)
RDW: 14 % (ref 12.3–15.4)
WBC: 7.2 10*3/uL (ref 3.4–10.8)

## 2017-10-26 LAB — URIC ACID: Uric Acid: 6.9 mg/dL (ref 3.7–8.6)

## 2017-10-26 LAB — PSA: PROSTATE SPECIFIC AG, SERUM: 1.1 ng/mL (ref 0.0–4.0)

## 2017-10-26 LAB — TSH: TSH: 3.5 u[IU]/mL (ref 0.450–4.500)

## 2017-10-30 ENCOUNTER — Encounter: Payer: Self-pay | Admitting: Family Medicine

## 2017-11-09 DIAGNOSIS — I10 Essential (primary) hypertension: Secondary | ICD-10-CM | POA: Diagnosis not present

## 2017-11-09 DIAGNOSIS — I2510 Atherosclerotic heart disease of native coronary artery without angina pectoris: Secondary | ICD-10-CM | POA: Diagnosis not present

## 2017-11-09 DIAGNOSIS — E782 Mixed hyperlipidemia: Secondary | ICD-10-CM | POA: Diagnosis not present

## 2017-11-23 ENCOUNTER — Ambulatory Visit (INDEPENDENT_AMBULATORY_CARE_PROVIDER_SITE_OTHER): Payer: BLUE CROSS/BLUE SHIELD | Admitting: Family Medicine

## 2017-11-23 ENCOUNTER — Encounter: Payer: Self-pay | Admitting: Family Medicine

## 2017-11-23 VITALS — BP 130/79 | HR 78 | Temp 97.9°F | Wt 246.1 lb

## 2017-11-23 DIAGNOSIS — I2583 Coronary atherosclerosis due to lipid rich plaque: Secondary | ICD-10-CM

## 2017-11-23 DIAGNOSIS — N182 Chronic kidney disease, stage 2 (mild): Secondary | ICD-10-CM | POA: Diagnosis not present

## 2017-11-23 DIAGNOSIS — I2510 Atherosclerotic heart disease of native coronary artery without angina pectoris: Secondary | ICD-10-CM | POA: Diagnosis not present

## 2017-11-23 DIAGNOSIS — I10 Essential (primary) hypertension: Secondary | ICD-10-CM | POA: Diagnosis not present

## 2017-11-23 NOTE — Progress Notes (Signed)
BP 130/79   Pulse 78   Temp 97.9 F (36.6 C) (Oral)   Wt 246 lb 1.6 oz (111.6 kg)   SpO2 98%   BMI 38.54 kg/m    Subjective:    Patient ID: Ricky Melton, male    DOB: [birth year], 67 y.o.   MRN: [id number]  HPI: Ricky Melton is a 67 y.o. male  Chief Complaint  Patient presents with  . Hypertension    4 week f/up   Pt here today for 1 month BP recheck. Lasix was d/c'ed at previous visit, currently taking HCTZ, coreg, benazepril, and amlodipine. Home BPs have been typically under 140/90, no more orthostatic sxs. Currently under a lot of stress and not sleeping well which pt states is why it's up right now. Denies CP, HAs, visual changes, SOB. Still taking 10 meq potassium.   Relevant past medical, surgical, family and social history reviewed and updated as indicated. Interim medical history since our last visit reviewed. Allergies and medications reviewed and updated.  Review of Systems  Per HPI unless specifically indicated above     Objective:    BP 130/79   Pulse 78   Temp 97.9 F (36.6 C) (Oral)   Wt 246 lb 1.6 oz (111.6 kg)   SpO2 98%   BMI 38.54 kg/m   Wt Readings from Last 3 Encounters:  11/23/17 246 lb 1.6 oz (111.6 kg)  10/25/17 246 lb 9.6 oz (111.9 kg)  10/25/17 246 lb 9.6 oz (111.9 kg)    Physical Exam  Constitutional: He is oriented to person, place, and time. He appears well-developed and well-nourished. No distress.  HENT:  Head: Atraumatic.  Eyes: Conjunctivae are normal. Pupils are equal, round, and reactive to light.  Neck: Normal range of motion. Neck supple.  Cardiovascular: Normal rate and normal heart sounds.  Pulmonary/Chest: Effort normal and breath sounds normal. No respiratory distress.  Musculoskeletal: Normal range of motion. He exhibits no edema.  Neurological: He is alert and oriented to person, place, and time.  Skin: Skin is warm and dry.  Psychiatric: He has a normal mood and affect. His behavior is normal.  Nursing note and  vitals reviewed.   Results for orders placed or performed in visit on 24/26/83  Basic Metabolic Panel (BMET)  Result Value Ref Range   Glucose 97 65 - 99 mg/dL   BUN 26 8 - 27 mg/dL   Creatinine, Ser 1.64 (H) 0.76 - 1.27 mg/dL   GFR calc non Af Amer 43 (L) >59 mL/min/1.73   GFR calc Af Amer 49 (L) >59 mL/min/1.73   BUN/Creatinine Ratio 16 10 - 24   Sodium 144 134 - 144 mmol/L   Potassium 4.7 3.5 - 5.2 mmol/L   Chloride 107 (H) 96 - 106 mmol/L   CO2 20 20 - 29 mmol/L   Calcium 9.4 8.6 - 10.2 mg/dL      Assessment & Plan:   Problem List Items Addressed This Visit      Cardiovascular and Mediastinum   Hypertension - Primary    BPs under good control with current regimen, without orthostatic sxs with positional changes. Will recheck BMP today given recent changes and adjust as needed. Continue current regimen for now      Relevant Medications   rosuvastatin (CRESTOR) 40 MG tablet   Other Relevant Orders   Basic Metabolic Panel (BMET) (Completed)     Genitourinary   CKD (chronic kidney disease), stage II   Relevant Orders   Basic  Metabolic Panel (BMET) (Completed)       Follow up plan: Return in about 6 months (around 05/26/2018) for CMP, lipid, 6 month f/u.

## 2017-11-24 LAB — BASIC METABOLIC PANEL
BUN/Creatinine Ratio: 16 (ref 10–24)
BUN: 26 mg/dL (ref 8–27)
CHLORIDE: 107 mmol/L — AB (ref 96–106)
CO2: 20 mmol/L (ref 20–29)
Calcium: 9.4 mg/dL (ref 8.6–10.2)
Creatinine, Ser: 1.64 mg/dL — ABNORMAL HIGH (ref 0.76–1.27)
GFR calc Af Amer: 49 mL/min/{1.73_m2} — ABNORMAL LOW (ref >59)
GFR calc non Af Amer: 43 mL/min/{1.73_m2} — ABNORMAL LOW (ref >59)
GLUCOSE: 97 mg/dL (ref 65–99)
POTASSIUM: 4.7 mmol/L (ref 3.5–5.2)
SODIUM: 144 mmol/L (ref 134–144)

## 2017-11-26 NOTE — Assessment & Plan Note (Signed)
BPs under good control with current regimen, without orthostatic sxs with positional changes. Will recheck BMP today given recent changes and adjust as needed. Continue current regimen for now

## 2017-11-26 NOTE — Patient Instructions (Signed)
Follow up in 6 months 

## 2017-11-27 ENCOUNTER — Other Ambulatory Visit: Payer: Self-pay | Admitting: Family Medicine

## 2017-11-27 DIAGNOSIS — N289 Disorder of kidney and ureter, unspecified: Secondary | ICD-10-CM

## 2017-12-28 ENCOUNTER — Other Ambulatory Visit: Payer: Medicare Other

## 2017-12-28 DIAGNOSIS — N289 Disorder of kidney and ureter, unspecified: Secondary | ICD-10-CM

## 2017-12-29 LAB — BASIC METABOLIC PANEL
BUN/Creatinine Ratio: 15 (ref 10–24)
BUN: 23 mg/dL (ref 8–27)
CO2: 21 mmol/L (ref 20–29)
CREATININE: 1.49 mg/dL — AB (ref 0.76–1.27)
Calcium: 9.4 mg/dL (ref 8.6–10.2)
Chloride: 108 mmol/L — ABNORMAL HIGH (ref 96–106)
GFR calc Af Amer: 55 mL/min/{1.73_m2} — ABNORMAL LOW (ref >59)
GFR calc non Af Amer: 48 mL/min/{1.73_m2} — ABNORMAL LOW (ref >59)
Glucose: 96 mg/dL (ref 65–99)
Potassium: 4.5 mmol/L (ref 3.5–5.2)
SODIUM: 143 mmol/L (ref 134–144)

## 2017-12-31 ENCOUNTER — Encounter: Payer: Self-pay | Admitting: Family Medicine

## 2018-01-03 ENCOUNTER — Ambulatory Visit: Payer: Medicare Other | Admitting: Family Medicine

## 2018-01-15 ENCOUNTER — Telehealth: Payer: Self-pay | Admitting: Family Medicine

## 2018-01-15 NOTE — Telephone Encounter (Signed)
Copied from [place] [id number]. Topic: Quick Communication - See Telephone Encounter >> Jan 15, 2018 10:11 AM Cleaster Corin, NT wrote: CRM for notification. See Telephone encounter for: 01/15/18.  Pt. Calling to ask about bill. Pt. States that he has received a call yet came by office to drop bill off doesn't remember the date of appt. But thinks it was for December.

## 2018-01-17 NOTE — Telephone Encounter (Signed)
Santiago Glad,  Please call Mr. Landgren and let him know that [employer] was filed as primary in error and to disregard the bill because the claim is being reprocessed and sent to [facility] for payment.  Thanks,

## 2018-01-17 NOTE — Telephone Encounter (Signed)
Spoke with patient. Information was explained to the patient.

## 2018-03-08 ENCOUNTER — Encounter: Payer: Self-pay | Admitting: Physician Assistant

## 2018-03-08 ENCOUNTER — Ambulatory Visit (INDEPENDENT_AMBULATORY_CARE_PROVIDER_SITE_OTHER): Payer: BLUE CROSS/BLUE SHIELD | Admitting: Physician Assistant

## 2018-03-08 VITALS — BP 127/71 | HR 62 | Temp 98.3°F | Ht 67.0 in | Wt 246.4 lb

## 2018-03-08 DIAGNOSIS — I10 Essential (primary) hypertension: Secondary | ICD-10-CM | POA: Diagnosis not present

## 2018-03-08 DIAGNOSIS — R42 Dizziness and giddiness: Secondary | ICD-10-CM

## 2018-03-08 DIAGNOSIS — E039 Hypothyroidism, unspecified: Secondary | ICD-10-CM

## 2018-03-08 MED ORDER — HYDROCHLOROTHIAZIDE 25 MG PO TABS: 12.5000 mg | ORAL_TABLET | Freq: Every day | ORAL | 4 refills | Status: DC

## 2018-03-08 NOTE — Patient Instructions (Signed)

## 2018-03-08 NOTE — Progress Notes (Signed)
Subjective:    Patient ID: Ricky Melton, male    DOB: 1951-01-27, 67 y.o.   MRN: [id number]  Ricky Melton is a 67 y.o. male presenting on 03/08/2018 for Hypertension   HPI   PMH: HTN on 10 mg amlodipine, benazepril 40 mg QD, coreg 25 mg BID, HCTZ 25 mg QD; CAD, hypothryoidism, HLD  Presents today with 1.5 weeks of lightheadedness when changing positions from bending over to standing. Took BP in store at times of light headedness, it was 100's/50's. He was working in the yard on [employer] and had similar episode the other day. He is not having any chest pain or shortness of breath. Followed by Dr. Humphrey Rolls at [facility].  He also has a history of vertigo. Notices that sometimes he can turn his head and the room will be spinning.    Social History   Tobacco Use  . Smoking status: Never Smoker  . Smokeless tobacco: Former Systems developer    Types: Chew  Substance Use Topics  . Alcohol use: No    Alcohol/week: 0.0 oz  . Drug use: No    Review of Systems Per HPI unless specifically indicated above     Objective:    BP 127/71 (BP Location: Right Arm, Patient Position: Sitting, Cuff Size: Large)   Pulse 62   Temp 98.3 F (36.8 C) (Oral)   Ht 5\' 7"  (1.702 m)   Wt 246 lb 6.4 oz (111.8 kg)   SpO2 97%   BMI 38.59 kg/m   Wt Readings from Last 3 Encounters:  03/08/18 246 lb 6.4 oz (111.8 kg)  11/23/17 246 lb 1.6 oz (111.6 kg)  10/25/17 246 lb 9.6 oz (111.9 kg)    Physical Exam  Constitutional: He is oriented to person, place, and time. He appears well-developed and well-nourished.  Eyes: Pupils are equal, round, and reactive to light. EOM are normal.  Cardiovascular: Normal rate and regular rhythm.  Pulmonary/Chest: Effort normal and breath sounds normal.  Musculoskeletal: He exhibits edema.  1+ pitting edema up to ankles bilaterally  Neurological: He is alert and oriented to person, place, and time. No cranial nerve deficit.  Skin: Skin is warm and dry.  Psychiatric: He has a normal  mood and affect. His behavior is normal.   Results for orders placed or performed in visit on 79/02/40  Basic Metabolic Panel (BMET)  Result Value Ref Range   Glucose 96 65 - 99 mg/dL   BUN 23 8 - 27 mg/dL   Creatinine, Ser 1.49 (H) 0.76 - 1.27 mg/dL   GFR calc non Af Amer 48 (L) >59 mL/min/1.73   GFR calc Af Amer 55 (L) >59 mL/min/1.73   BUN/Creatinine Ratio 15 10 - 24   Sodium 143 134 - 144 mmol/L   Potassium 4.5 3.5 - 5.2 mmol/L   Chloride 108 (H) 96 - 106 mmol/L   CO2 21 20 - 29 mmol/L   Calcium 9.4 8.6 - 10.2 mg/dL      Assessment & Plan:   1. Essential hypertension  Symptoms consistent with orthostatic hypotension. Will have him cut HCTZ in half and see if his symptoms improve. His BP is well controlled today in the office.  - hydrochlorothiazide (HYDRODIURIL) 25 MG tablet; Take 0.5 tablets (12.5 mg total) by mouth daily.  Dispense: 90 tablet; Refill: 4  2. Hypothyroidism, unspecified type   3. Vertigo  Also describes some symptoms consistent with vertigo. Will reassess to see how BP management works to alleviate symptom. In  the mean time, can do exercises to treat vertigo.     Follow up plan: Return in about 1 month (around 04/07/2018) for HTN.  Carles Collet, PA-C [facility] 03/08/2018, 2:18 PM

## 2018-03-18 ENCOUNTER — Other Ambulatory Visit: Payer: Self-pay | Admitting: Family Medicine

## 2018-03-19 NOTE — Telephone Encounter (Signed)
Nasocort refill Last OV:03/08/18 Last refill:04/13/2016 (expired) [id number] Pharmacy: [id number] - [place], [place] - [address] [phone] (Phone) [phone] (Fax)

## 2018-03-22 DIAGNOSIS — I10 Essential (primary) hypertension: Secondary | ICD-10-CM | POA: Diagnosis not present

## 2018-03-22 DIAGNOSIS — E782 Mixed hyperlipidemia: Secondary | ICD-10-CM | POA: Diagnosis not present

## 2018-03-22 DIAGNOSIS — I2510 Atherosclerotic heart disease of native coronary artery without angina pectoris: Secondary | ICD-10-CM | POA: Diagnosis not present

## 2018-03-22 DIAGNOSIS — R42 Dizziness and giddiness: Secondary | ICD-10-CM | POA: Diagnosis not present

## 2018-03-22 DIAGNOSIS — R079 Chest pain, unspecified: Secondary | ICD-10-CM | POA: Diagnosis not present

## 2018-03-29 DIAGNOSIS — I10 Essential (primary) hypertension: Secondary | ICD-10-CM | POA: Diagnosis not present

## 2018-03-29 DIAGNOSIS — R42 Dizziness and giddiness: Secondary | ICD-10-CM | POA: Diagnosis not present

## 2018-03-29 DIAGNOSIS — I2510 Atherosclerotic heart disease of native coronary artery without angina pectoris: Secondary | ICD-10-CM | POA: Diagnosis not present

## 2018-03-29 DIAGNOSIS — E782 Mixed hyperlipidemia: Secondary | ICD-10-CM | POA: Diagnosis not present

## 2018-04-17 ENCOUNTER — Encounter: Payer: Self-pay | Admitting: Family Medicine

## 2018-04-17 ENCOUNTER — Ambulatory Visit (INDEPENDENT_AMBULATORY_CARE_PROVIDER_SITE_OTHER): Payer: BLUE CROSS/BLUE SHIELD | Admitting: Family Medicine

## 2018-04-17 VITALS — BP 138/72 | HR 62 | Ht 70.0 in | Wt 247.0 lb

## 2018-04-17 DIAGNOSIS — I2510 Atherosclerotic heart disease of native coronary artery without angina pectoris: Secondary | ICD-10-CM

## 2018-04-17 DIAGNOSIS — Z1211 Encounter for screening for malignant neoplasm of colon: Secondary | ICD-10-CM | POA: Diagnosis not present

## 2018-04-17 DIAGNOSIS — M1000 Idiopathic gout, unspecified site: Secondary | ICD-10-CM | POA: Diagnosis not present

## 2018-04-17 DIAGNOSIS — E785 Hyperlipidemia, unspecified: Secondary | ICD-10-CM

## 2018-04-17 DIAGNOSIS — I2583 Coronary atherosclerosis due to lipid rich plaque: Secondary | ICD-10-CM | POA: Diagnosis not present

## 2018-04-17 DIAGNOSIS — I10 Essential (primary) hypertension: Secondary | ICD-10-CM | POA: Diagnosis not present

## 2018-04-17 NOTE — Assessment & Plan Note (Signed)
The current medical regimen is effective;  continue present plan and medications.  

## 2018-04-17 NOTE — Progress Notes (Signed)
BP 138/72 (BP Location: Left Arm)   Pulse 62   Ht 5\' 10"  (1.778 m)   Wt 247 lb (112 kg)   SpO2 96%   BMI 35.44 kg/m    Subjective:    Patient ID: Ricky Melton, male    DOB: [birth year], 67 y.o.   MRN: [id number]  HPI: Ricky Melton is a 67 y.o. male  Chief Complaint  Patient presents with  . Follow-up  . Hypertension  Patient follow-up multiple questions feeling significantly better after decreasing hydrochlorothiazide from 25 mg to 12.5 mg.  No further lightheaded spells does have some very slight trace edema of his ankles most likely from amlodipine.  No PND no orthopnea. Patient asking about controlling blood pressure and weight loss which was reviewed with patient and the Dash diet. No gout symptoms. BMP drawn from cardiology last month review unable to find this results.  Relevant past medical, surgical, family and social history reviewed and updated as indicated. Interim medical history since our last visit reviewed. Allergies and medications reviewed and updated.  Review of Systems  Constitutional: Negative.   Respiratory: Negative.   Cardiovascular: Negative.     Per HPI unless specifically indicated above     Objective:    BP 138/72 (BP Location: Left Arm)   Pulse 62   Ht 5\' 10"  (1.778 m)   Wt 247 lb (112 kg)   SpO2 96%   BMI 35.44 kg/m   Wt Readings from Last 3 Encounters:  04/17/18 247 lb (112 kg)  03/08/18 246 lb 6.4 oz (111.8 kg)  11/23/17 246 lb 1.6 oz (111.6 kg)    Physical Exam  Constitutional: He is oriented to person, place, and time. He appears well-developed and well-nourished.  HENT:  Head: Normocephalic and atraumatic.  Eyes: Conjunctivae and EOM are normal.  Neck: Normal range of motion.  Cardiovascular: Normal rate, regular rhythm and normal heart sounds.  Pulmonary/Chest: Effort normal and breath sounds normal.  Musculoskeletal: Normal range of motion.  Neurological: He is alert and oriented to person, place, and time.  Skin: No  erythema.  Psychiatric: He has a normal mood and affect. His behavior is normal. Judgment and thought content normal.    Results for orders placed or performed in visit on 24/58/09  Basic Metabolic Panel (BMET)  Result Value Ref Range   Glucose 96 65 - 99 mg/dL   BUN 23 8 - 27 mg/dL   Creatinine, Ser 1.49 (H) 0.76 - 1.27 mg/dL   GFR calc non Af Amer 48 (L) >59 mL/min/1.73   GFR calc Af Amer 55 (L) >59 mL/min/1.73   BUN/Creatinine Ratio 15 10 - 24   Sodium 143 134 - 144 mmol/L   Potassium 4.5 3.5 - 5.2 mmol/L   Chloride 108 (H) 96 - 106 mmol/L   CO2 21 20 - 29 mmol/L   Calcium 9.4 8.6 - 10.2 mg/dL      Assessment & Plan:   Problem List Items Addressed This Visit      Cardiovascular and Mediastinum   Hypertension - Primary    The current medical regimen is effective;  continue present plan and medications.       Relevant Orders   Basic metabolic panel     Other   Hyperlipidemia    The current medical regimen is effective;  continue present plan and medications.       Gout    The current medical regimen is effective;  continue present plan and medications.  Other Visit Diagnoses    Colon cancer screening       Relevant Orders   Ambulatory referral to Gastroenterology        Follow up plan: Return in about 6 months (around 10/18/2018) for Physical Exam.

## 2018-04-18 ENCOUNTER — Other Ambulatory Visit: Payer: Self-pay

## 2018-04-18 ENCOUNTER — Telehealth: Payer: Self-pay

## 2018-04-18 ENCOUNTER — Encounter: Payer: Self-pay | Admitting: Family Medicine

## 2018-04-18 DIAGNOSIS — Z1211 Encounter for screening for malignant neoplasm of colon: Secondary | ICD-10-CM

## 2018-04-18 LAB — BASIC METABOLIC PANEL
BUN/Creatinine Ratio: 14 (ref 10–24)
BUN: 21 mg/dL (ref 8–27)
CALCIUM: 9.4 mg/dL (ref 8.6–10.2)
CO2: 22 mmol/L (ref 20–29)
CREATININE: 1.45 mg/dL — AB (ref 0.76–1.27)
Chloride: 107 mmol/L — ABNORMAL HIGH (ref 96–106)
GFR calc Af Amer: 57 mL/min/{1.73_m2} — ABNORMAL LOW (ref >59)
GFR, EST NON AFRICAN AMERICAN: 49 mL/min/{1.73_m2} — AB (ref >59)
Glucose: 95 mg/dL (ref 65–99)
Potassium: 4.7 mmol/L (ref 3.5–5.2)
Sodium: 143 mmol/L (ref 134–144)

## 2018-04-18 NOTE — Telephone Encounter (Signed)
Colonoscopy has been scheduled 05/10/18 at [facility] Dr. Vicente Males.

## 2018-05-10 ENCOUNTER — Ambulatory Visit: Payer: BLUE CROSS/BLUE SHIELD | Admitting: Anesthesiology

## 2018-05-10 ENCOUNTER — Encounter
Admission: RE | Disposition: A | Discharge status: Home / Self Care | Payer: Self-pay | Source: Ambulatory Visit | Attending: Gastroenterology

## 2018-05-10 ENCOUNTER — Encounter: Payer: Self-pay | Admitting: Anesthesiology

## 2018-05-10 ENCOUNTER — Ambulatory Visit
Admission: RE | Admit: 2018-05-10 | Discharge: 2018-05-10 | Disposition: A | Discharge status: Home / Self Care | Payer: BLUE CROSS/BLUE SHIELD | Source: Ambulatory Visit | Attending: Gastroenterology | Admitting: Gastroenterology

## 2018-05-10 DIAGNOSIS — E785 Hyperlipidemia, unspecified: Secondary | ICD-10-CM | POA: Insufficient documentation

## 2018-05-10 DIAGNOSIS — Z8249 Family history of ischemic heart disease and other diseases of the circulatory system: Secondary | ICD-10-CM | POA: Diagnosis not present

## 2018-05-10 DIAGNOSIS — Z833 Family history of diabetes mellitus: Secondary | ICD-10-CM | POA: Insufficient documentation

## 2018-05-10 DIAGNOSIS — E039 Hypothyroidism, unspecified: Secondary | ICD-10-CM | POA: Diagnosis not present

## 2018-05-10 DIAGNOSIS — I129 Hypertensive chronic kidney disease with stage 1 through stage 4 chronic kidney disease, or unspecified chronic kidney disease: Secondary | ICD-10-CM | POA: Diagnosis not present

## 2018-05-10 DIAGNOSIS — Z1211 Encounter for screening for malignant neoplasm of colon: Secondary | ICD-10-CM

## 2018-05-10 DIAGNOSIS — K5730 Diverticulosis of large intestine without perforation or abscess without bleeding: Secondary | ICD-10-CM | POA: Insufficient documentation

## 2018-05-10 DIAGNOSIS — I2510 Atherosclerotic heart disease of native coronary artery without angina pectoris: Secondary | ICD-10-CM | POA: Diagnosis not present

## 2018-05-10 DIAGNOSIS — Z955 Presence of coronary angioplasty implant and graft: Secondary | ICD-10-CM | POA: Diagnosis not present

## 2018-05-10 DIAGNOSIS — Z79899 Other long term (current) drug therapy: Secondary | ICD-10-CM | POA: Insufficient documentation

## 2018-05-10 DIAGNOSIS — N182 Chronic kidney disease, stage 2 (mild): Secondary | ICD-10-CM | POA: Diagnosis not present

## 2018-05-10 HISTORY — PX: COLONOSCOPY WITH PROPOFOL: SHX5780

## 2018-05-10 SURGERY — COLONOSCOPY WITH PROPOFOL
Anesthesia: General

## 2018-05-10 MED ORDER — LIDOCAINE HCL (PF) 1 % IJ SOLN
INTRAMUSCULAR | Status: AC
  Administered 2018-05-10: 0.3 mL via INTRADERMAL
  Filled 2018-05-10: qty 2

## 2018-05-10 MED ORDER — PROPOFOL 500 MG/50ML IV EMUL
INTRAVENOUS | Status: AC
  Filled 2018-05-10: qty 50

## 2018-05-10 MED ORDER — LIDOCAINE HCL (CARDIAC) PF 100 MG/5ML IV SOSY
PREFILLED_SYRINGE | INTRAVENOUS | Status: DC | PRN
  Administered 2018-05-10: 50 mg via INTRAVENOUS

## 2018-05-10 MED ORDER — PROPOFOL 10 MG/ML IV BOLUS
INTRAVENOUS | Status: DC | PRN
  Administered 2018-05-10: 120 mg via INTRAVENOUS
  Administered 2018-05-10: 20 mg via INTRAVENOUS

## 2018-05-10 MED ORDER — LIDOCAINE HCL (PF) 1 % IJ SOLN
2.0000 mL | Freq: Once | INTRAMUSCULAR | Status: AC
  Administered 2018-05-10: 0.3 mL via INTRADERMAL

## 2018-05-10 MED ORDER — SODIUM CHLORIDE 0.9 % IV SOLN
INTRAVENOUS | Status: DC
  Administered 2018-05-10: 1000 mL via INTRAVENOUS

## 2018-05-10 NOTE — Anesthesia Post-op Follow-up Note (Signed)
Anesthesia QCDR form completed.        

## 2018-05-10 NOTE — Transfer of Care (Signed)
Immediate Anesthesia Transfer of Care Note  Patient: Ricky Melton  Procedure(s) Performed: COLONOSCOPY WITH PROPOFOL (N/A )  Patient Location: PACU and Endoscopy Unit  Anesthesia Type:General  Level of Consciousness: awake, alert  and oriented  Airway & Oxygen Therapy: Patient Spontanous Breathing and Patient connected to nasal cannula oxygen  Post-op Assessment: Report given to RN and Post -op Vital signs reviewed and stable  Post vital signs: Reviewed, stable and unstable  Last Vitals:  Vitals Value Taken Time  BP 131/71 05/10/2018 10:31 AM  Temp 36.1 C 05/10/2018 10:31 AM  Pulse 62 05/10/2018 10:34 AM  Resp 13 05/10/2018 10:34 AM  SpO2 98 % 05/10/2018 10:34 AM  Vitals shown include unvalidated device data.  Last Pain:  Vitals:   05/10/18 1031  TempSrc: Tympanic  PainSc: 0-No pain         Complications: No apparent anesthesia complications

## 2018-05-10 NOTE — H&P (Signed)
Ricky Bellows, MD [address] [place], [place], [place], [address] [address], [place], [place], [place], [address] Phone: [phone]  Fax: [phone]  Primary Care Physician:  Guadalupe Maple, MD   Pre-Procedure History & Physical: HPI:  Ricky Melton is a 67 y.o. male is here for an colonoscopy.   Past Medical History:  Diagnosis Date  . Benign hematuria   . CAD (coronary artery disease)   . Chronic kidney disease   . Hyperlipidemia   . Hypertension     Past Surgical History:  Procedure Laterality Date  . cardiac stents     x2    Prior to Admission medications   Medication Sig Start Date End Date Taking? Authorizing Provider  allopurinol (ZYLOPRIM) 300 MG tablet Take 1 tablet (300 mg total) by mouth daily. 10/25/17   Guadalupe Maple, MD  amLODipine (NORVASC) 10 MG tablet Take 1 tablet (10 mg total) by mouth daily. 10/25/17   Guadalupe Maple, MD  aspirin 81 MG tablet Take 81 mg by mouth daily.    [provider]  benazepril (LOTENSIN) 40 MG tablet Take 1 tablet (40 mg total) by mouth daily. 10/25/17   Guadalupe Maple, MD  carvedilol (COREG) 25 MG tablet Take 1 tablet (25 mg total) by mouth 2 (two) times daily with a meal. 10/25/17   Crissman, Jeannette How, MD  clopidogrel (PLAVIX) 75 MG tablet Take 75 mg by mouth daily.    [provider]  hydrochlorothiazide (HYDRODIURIL) 25 MG tablet Take 0.5 tablets (12.5 mg total) by mouth daily. 03/08/18   Trinna Post, PA-C  levothyroxine (SYNTHROID, LEVOTHROID) 50 MCG tablet Take 1 tablet (50 mcg total) by mouth daily. 10/25/17   Guadalupe Maple, MD  rosuvastatin (CRESTOR) 40 MG tablet Take 40 mg by mouth daily. 11/09/17   [provider]  triamcinolone (NASACORT) 55 MCG/ACT AERO nasal inhaler PLACE 2 SPRAYS INTO THE NOSE DAILY. 03/19/18   Guadalupe Maple, MD    Allergies as of 04/18/2018  . (No Known Allergies)    Family History  Problem Relation Age of Onset  . Hypertension Mother     . Diabetes Sister   . Diabetes Son     Social History   Socioeconomic History  . Marital status: Married    Spouse name: Not on file  . Number of children: Not on file  . Years of education: Not on file  . Highest education level: Not on file  Occupational History  . Not on file  Social Needs  . Financial resource strain: Not hard at all  . Food insecurity:    Worry: Never true    Inability: Never true  . Transportation needs:    Medical: No    Non-medical: No  Tobacco Use  . Smoking status: Never Smoker  . Smokeless tobacco: Former Systems developer    Types: Chew  Substance and Sexual Activity  . Alcohol use: No    Alcohol/week: 0.0 standard drinks  . Drug use: No  . Sexual activity: Yes  Lifestyle  . Physical activity:    Days per week: 0 days    Minutes per session: 0 min  . Stress: Not at all  Relationships  . Social connections:    Talks on phone: More than three times a week    Gets together: More than three times a week    Attends religious service: More than 4 times per year  Active member of club or organization: Yes    Attends meetings of clubs or organizations: More than 4 times per year    Relationship status: Married  . Intimate partner violence:    Fear of current or ex partner: No    Emotionally abused: No    Physically abused: No    Forced sexual activity: No  Other Topics Concern  . Not on file  Social History Narrative  . Not on file    Review of Systems: See HPI, otherwise negative ROS  Physical Exam: There were no vitals taken for this visit. General:   Alert,  pleasant and cooperative in NAD Head:  Normocephalic and atraumatic. Neck:  Supple; no masses or thyromegaly. Lungs:  Clear throughout to auscultation, normal respiratory effort.    Heart:  +S1, +S2, Regular rate and rhythm, No edema. Abdomen:  Soft, nontender and nondistended. Normal bowel sounds, without guarding, and without rebound.   Neurologic:  Alert and  oriented x4;   grossly normal neurologically.  Impression/Plan: Ricky Melton is here for an colonoscopy to be performed for Screening colonoscopy average risk   Risks, benefits, limitations, and alternatives regarding  colonoscopy have been reviewed with the patient.  Questions have been answered.  All parties agreeable.   Ricky Bellows, MD  05/10/2018, 9:43 AM

## 2018-05-10 NOTE — Op Note (Signed)
[facility] Gastroenterology Patient Name: Ricky Melton Procedure Date: 05/10/2018 9:46 AM MRN: [id number] Account #: [id number] Date of Birth: 1951/06/13 Admit Type: Outpatient Age: 67 Room: [facility] ENDO ROOM 4 Gender: Male Note Status: Finalized Procedure:            Colonoscopy Indications:          Screening for colorectal malignant neoplasm Providers:            Jonathon Bellows MD, MD Referring MD:         Guadalupe Maple, MD (Referring MD) Medicines:            Monitored Anesthesia Care Complications:        No immediate complications. Procedure:            Pre-Anesthesia Assessment:                       - Prior to the procedure, a History and Physical was                        performed, and patient medications, allergies and                        sensitivities were reviewed. The patient's tolerance of                        previous anesthesia was reviewed.                       - The risks and benefits of the procedure and the                        sedation options and risks were discussed with the                        patient. All questions were answered and informed                        consent was obtained.                       - After reviewing the risks and benefits, the patient                        was deemed in satisfactory condition to undergo the                        procedure.                       - ASA Grade Assessment: II - A patient with mild                        systemic disease.                       After obtaining informed consent, the colonoscope was                        passed under direct vision. Throughout the procedure,  the patient's blood pressure, pulse, and oxygen                        saturations were monitored continuously. The                        Colonoscope was introduced through the anus and                        advanced to the the cecum, identified by the   appendiceal orifice, IC valve and transillumination.                        The colonoscopy was performed with ease. The patient                        tolerated the procedure well. The quality of the bowel                        preparation was good. Findings:      The perianal and digital rectal examinations were normal.      Multiple small-mouthed diverticula were found in the sigmoid colon.      The exam was otherwise without abnormality on direct and retroflexion       views. Impression:           - Diverticulosis in the sigmoid colon.                       - The examination was otherwise normal on direct and                        retroflexion views.                       - No specimens collected. Recommendation:       - Discharge patient to home (with escort).                       - Resume previous diet.                       - Continue present medications.                       - Repeat colonoscopy in 10 years for screening purposes. Procedure Code(s):    --- Professional ---                       [id number], Colonoscopy, flexible; diagnostic, including                        collection of specimen(s) by brushing or washing, when                        performed (separate procedure) Diagnosis Code(s):    --- Professional ---                       Z12.11, Encounter for screening for malignant neoplasm                        of colon  K57.30, Diverticulosis of large intestine without                        perforation or abscess without bleeding CPT copyright 2017 American Medical Association. All rights reserved. The codes documented in this report are preliminary and upon coder review may  be revised to meet current compliance requirements. Jonathon Bellows, MD Jonathon Bellows MD, MD 05/10/2018 10:26:41 AM This report has been signed electronically. Number of Addenda: 0 Note Initiated On: 05/10/2018 9:46 AM Scope Withdrawal Time: 0 hours 10 minutes 23 seconds  Total  Procedure Duration: 0 hours 12 minutes 51 seconds       [facility]

## 2018-05-10 NOTE — Anesthesia Preprocedure Evaluation (Addendum)
Anesthesia Evaluation  Patient identified by MRN, date of birth, ID band Patient awake    Reviewed: Allergy & Precautions, NPO status , Patient's Chart, lab work & pertinent test results, reviewed documented beta blocker date and time   Airway Mallampati: III  TM Distance: >3 FB     Dental  (+) Upper Dentures, Lower Dentures   Pulmonary           Cardiovascular hypertension, Pt. on medications + CAD and + Cardiac Stents       Neuro/Psych    GI/Hepatic   Endo/Other  Hypothyroidism   Renal/GU Renal disease     Musculoskeletal   Abdominal   Peds  Hematology   Anesthesia Other Findings   Reproductive/Obstetrics                            Anesthesia Physical Anesthesia Plan  ASA: III  Anesthesia Plan: General   Post-op Pain Management:    Induction: Intravenous  PONV Risk Score and Plan:   Airway Management Planned:   Additional Equipment:   Intra-op Plan:   Post-operative Plan:   Informed Consent: I have reviewed the patients History and Physical, chart, labs and discussed the procedure including the risks, benefits and alternatives for the proposed anesthesia with the patient or authorized representative who has indicated his/her understanding and acceptance.     Plan Discussed with: CRNA  Anesthesia Plan Comments:         Anesthesia Quick Evaluation

## 2018-05-10 NOTE — Anesthesia Postprocedure Evaluation (Signed)
Anesthesia Post Note  Patient: Ricky Melton  Procedure(s) Performed: COLONOSCOPY WITH PROPOFOL (N/A )  Patient location during evaluation: Endoscopy Anesthesia Type: General Level of consciousness: awake and alert Pain management: pain level controlled Vital Signs Assessment: post-procedure vital signs reviewed and stable Respiratory status: spontaneous breathing, nonlabored ventilation, respiratory function stable and patient connected to nasal cannula oxygen Cardiovascular status: blood pressure returned to baseline and stable Postop Assessment: no apparent nausea or vomiting Anesthetic complications: no     Last Vitals:  Vitals:   05/10/18 0950 05/10/18 1031  BP: (!) 157/76 131/71  Pulse: (!) 59   Resp: 17   Temp: (!) 36.2 C (!) 36.1 C  SpO2: 100%     Last Pain:  Vitals:   05/10/18 1101  TempSrc:   PainSc: 0-No pain                 Karel,Bettey Muraoka S

## 2018-05-14 ENCOUNTER — Encounter: Payer: Self-pay | Admitting: Gastroenterology

## 2018-05-31 ENCOUNTER — Ambulatory Visit: Payer: Medicare Other | Admitting: Family Medicine

## 2018-06-27 ENCOUNTER — Ambulatory Visit (INDEPENDENT_AMBULATORY_CARE_PROVIDER_SITE_OTHER): Payer: BLUE CROSS/BLUE SHIELD | Admitting: Family Medicine

## 2018-06-27 ENCOUNTER — Other Ambulatory Visit: Payer: Self-pay

## 2018-06-27 ENCOUNTER — Encounter: Payer: Self-pay | Admitting: Family Medicine

## 2018-06-27 VITALS — BP 143/81 | HR 60 | Temp 98.6°F | Ht 70.0 in | Wt 246.0 lb

## 2018-06-27 DIAGNOSIS — S39012A Strain of muscle, fascia and tendon of lower back, initial encounter: Secondary | ICD-10-CM

## 2018-06-27 DIAGNOSIS — Z23 Encounter for immunization: Secondary | ICD-10-CM | POA: Diagnosis not present

## 2018-06-27 DIAGNOSIS — I2583 Coronary atherosclerosis due to lipid rich plaque: Secondary | ICD-10-CM

## 2018-06-27 DIAGNOSIS — I2510 Atherosclerotic heart disease of native coronary artery without angina pectoris: Secondary | ICD-10-CM

## 2018-06-27 MED ORDER — TRIAMCINOLONE ACETONIDE 40 MG/ML IJ SUSP
40.0000 mg | Freq: Once | INTRAMUSCULAR | Status: AC
  Administered 2018-06-27: 40 mg via INTRAMUSCULAR

## 2018-06-27 MED ORDER — CYCLOBENZAPRINE HCL 10 MG PO TABS: 10.0000 mg | ORAL_TABLET | Freq: Three times a day (TID) | ORAL | 0 refills | Status: DC | PRN

## 2018-06-27 MED ORDER — TRAMADOL HCL 50 MG PO TABS: 50.0000 mg | ORAL_TABLET | Freq: Two times a day (BID) | ORAL | 0 refills | Status: DC | PRN

## 2018-06-27 NOTE — Patient Instructions (Signed)
Low Back Sprain Rehab  Ask your health care provider which exercises are safe for you. Do exercises exactly as told by your health care provider and adjust them as directed. It is normal to feel mild stretching, pulling, tightness, or discomfort as you do these exercises, but you should stop right away if you feel sudden pain or your pain gets worse. Do not begin these exercises until told by your health care provider.  Stretching and range of motion exercises  These exercises warm up your muscles and joints and improve the movement and flexibility of your back. These exercises also help to relieve pain, numbness, and tingling.  Exercise A: Lumbar rotation    1. Lie on your back on a firm surface and bend your knees.  2. Straighten your arms out to your sides so each arm forms an "L" shape with a side of your body (a 90 degree angle).  3. Slowly move both of your knees to one side of your body until you feel a stretch in your lower back. Try not to let your shoulders move off of the floor.  4. Hold for __________ seconds.  5. Tense your abdominal muscles and slowly move your knees back to the starting position.  6. Repeat this exercise on the other side of your body.  Repeat __________ times. Complete this exercise __________ times a day.  Exercise B: Prone extension on elbows    1. Lie on your abdomen on a firm surface.  2. Prop yourself up on your elbows.  3. Use your arms to help lift your chest up until you feel a gentle stretch in your abdomen and your lower back.  ? This will place some of your body weight on your elbows. If this is uncomfortable, try stacking pillows under your chest.  ? Your hips should stay down, against the surface that you are lying on. Keep your hip and back muscles relaxed.  4. Hold for __________ seconds.  5. Slowly relax your upper body and return to the starting position.  Repeat __________ times. Complete this exercise __________ times a day.  Strengthening exercises  These  exercises build strength and endurance in your back. Endurance is the ability to use your muscles for a long time, even after they get tired.  Exercise C: Pelvic tilt  1. Lie on your back on a firm surface. Bend your knees and keep your feet flat.  2. Tense your abdominal muscles. Tip your pelvis up toward the ceiling and flatten your lower back into the floor.  ? To help with this exercise, you may place a small towel under your lower back and try to push your back into the towel.  3. Hold for __________ seconds.  4. Let your muscles relax completely before you repeat this exercise.  Repeat __________ times. Complete this exercise __________ times a day.  Exercise D: Alternating arm and leg raises    1. Get on your hands and knees on a firm surface. If you are on a hard floor, you may want to use padding to cushion your knees, such as an exercise mat.  2. Line up your arms and legs. Your hands should be below your shoulders, and your knees should be below your hips.  3. Lift your left leg behind you. At the same time, raise your right arm and straighten it in front of you.  ? Do not lift your leg higher than your hip.  ? Do not lift your arm   higher than your shoulder.  ? Keep your abdominal and back muscles tight.  ? Keep your hips facing the ground.  ? Do not arch your back.  ? Keep your balance carefully, and do not hold your breath.  4. Hold for __________ seconds.  5. Slowly return to the starting position and repeat with your right leg and your left arm.  Repeat __________ times. Complete this exercise __________ times a day.  Exercise E: Abdominal set with straight leg raise    1. Lie on your back on a firm surface.  2. Bend one of your knees and keep your other leg straight.  3. Tense your abdominal muscles and lift your straight leg up, 4-6 inches (10-15 cm) off the ground.  4. Keep your abdominal muscles tight and hold for __________ seconds.  ? Do not hold your breath.  ? Do not arch your back. Keep it  flat against the ground.  5. Keep your abdominal muscles tense as you slowly lower your leg back to the starting position.  6. Repeat with your other leg.  Repeat __________ times. Complete this exercise __________ times a day.  Posture and body mechanics    Body mechanics refers to the movements and positions of your body while you do your daily activities. Posture is part of body mechanics. Good posture and healthy body mechanics can help to relieve stress in your body's tissues and joints. Good posture means that your spine is in its natural S-curve position (your spine is neutral), your shoulders are pulled back slightly, and your head is not tipped forward. The following are general guidelines for applying improved posture and body mechanics to your everyday activities.  Standing    · When standing, keep your spine neutral and your feet about hip-width apart. Keep a slight bend in your knees. Your ears, shoulders, and hips should line up.  · When you do a task in which you stand in one place for a long time, place one foot up on a stable object that is 2-4 inches (5-10 cm) high, such as a footstool. This helps keep your spine neutral.  Sitting    · When sitting, keep your spine neutral and keep your feet flat on the floor. Use a footrest, if necessary, and keep your thighs parallel to the floor. Avoid rounding your shoulders, and avoid tilting your head forward.  · When working at a desk or a computer, keep your desk at a height where your hands are slightly lower than your elbows. Slide your chair under your desk so you are close enough to maintain good posture.  · When working at a computer, place your monitor at a height where you are looking straight ahead and you do not have to tilt your head forward or downward to look at the screen.  Resting    · When lying down and resting, avoid positions that are most painful for you.  · If you have pain with activities such as sitting, bending, stooping, or squatting  (flexion-based activities), lie in a position in which your body does not bend very much. For example, avoid curling up on your side with your arms and knees near your chest (fetal position).  · If you have pain with activities such as standing for a long time or reaching with your arms (extension-based activities), lie with your spine in a neutral position and bend your knees slightly. Try the following positions:  · Lying on your side with a   pillow between your knees.  · Lying on your back with a pillow under your knees.  Lifting    · When lifting objects, keep your feet at least shoulder-width apart and tighten your abdominal muscles.  · Bend your knees and hips and keep your spine neutral. It is important to lift using the strength of your legs, not your back. Do not lock your knees straight out.  · Always ask for help to lift heavy or awkward objects.  This information is not intended to replace advice given to you by your health care provider. Make sure you discuss any questions you have with your health care provider.  Document Released: 08/28/2005 Document Revised: 05/04/2016 Document Reviewed: 06/09/2015  [employer]vier Interactive Patient Education © 2018 [employer]

## 2018-06-27 NOTE — Progress Notes (Signed)
BP (!) 143/81   Pulse 60   Temp 98.6 F (37 C) (Oral)   Ht 5\' 10"  (1.778 m)   Wt 246 lb (111.6 kg)   SpO2 97%   BMI 35.30 kg/m    Subjective:    Patient ID: Ricky Melton, male    DOB: Feb 02, 1951, 67 y.o.   MRN: [id number]  HPI: Ricky Melton is a 67 y.o. male  Chief Complaint  Patient presents with  . Back Pain    low back since last Tuesday/ pt states has taken aleve for pain   Here today with 2 days of left low back pain. States he does a lot of heavy lifting at his job, but did nothing out of the ordinary prior to onset. Stretching and pressing on the area makes the pain worse. Trying heat and aleve with some relief. Denies urinary sxs, radiation down leg, weakness of legs, numbness/tingling, fevers, incontinence, saddle paresthesias.   Relevant past medical, surgical, family and social history reviewed and updated as indicated. Interim medical history since our last visit reviewed. Allergies and medications reviewed and updated.  Review of Systems  Per HPI unless specifically indicated above     Objective:    BP (!) 143/81   Pulse 60   Temp 98.6 F (37 C) (Oral)   Ht 5\' 10"  (1.778 m)   Wt 246 lb (111.6 kg)   SpO2 97%   BMI 35.30 kg/m   Wt Readings from Last 3 Encounters:  06/27/18 246 lb (111.6 kg)  05/10/18 245 lb (111.1 kg)  04/17/18 247 lb (112 kg)    Physical Exam  Constitutional: He is oriented to person, place, and time. He appears well-developed and well-nourished. No distress.  HENT:  Head: Atraumatic.  Eyes: Conjunctivae and EOM are normal.  Neck: Normal range of motion. Neck supple.  Cardiovascular: Normal rate, regular rhythm and normal heart sounds.  Pulmonary/Chest: Effort normal and breath sounds normal.  Abdominal: Soft. Bowel sounds are normal.  Musculoskeletal: Normal range of motion.  Pain with both flexion and extension of back Localized ttp left paraspinal muscles with spasm No CVA ttp b/l - SLR  Lymphadenopathy:    He has no  cervical adenopathy.  Neurological: He is alert and oriented to person, place, and time. No cranial nerve deficit.  Skin: Skin is warm and dry.  Psychiatric: He has a normal mood and affect. His behavior is normal.  Nursing note and vitals reviewed.   Results for orders placed or performed in visit on 67/61/95  Basic metabolic panel  Result Value Ref Range   Glucose 95 65 - 99 mg/dL   BUN 21 8 - 27 mg/dL   Creatinine, Ser 1.45 (H) 0.76 - 1.27 mg/dL   GFR calc non Af Amer 49 (L) >59 mL/min/1.73   GFR calc Af Amer 57 (L) >59 mL/min/1.73   BUN/Creatinine Ratio 14 10 - 24   Sodium 143 134 - 144 mmol/L   Potassium 4.7 3.5 - 5.2 mmol/L   Chloride 107 (H) 96 - 106 mmol/L   CO2 22 20 - 29 mmol/L   Calcium 9.4 8.6 - 10.2 mg/dL      Assessment & Plan:   Problem List Items Addressed This Visit    None    Visit Diagnoses    Lumbar strain, initial encounter    -  Primary   IM Kenalog given today, flexeril and tramadol prn at bedtime. Sedation precautions reviewed, supportive care with heat, stretches (packet given),  and massage   Relevant Medications   triamcinolone acetonide (KENALOG-40) injection 40 mg (Completed)   Flu vaccine need       Relevant Orders   Flu vaccine HIGH DOSE PF (Completed)       Follow up plan: Return if symptoms worsen or fail to improve.

## 2018-07-05 DIAGNOSIS — E782 Mixed hyperlipidemia: Secondary | ICD-10-CM | POA: Diagnosis not present

## 2018-07-05 DIAGNOSIS — I2510 Atherosclerotic heart disease of native coronary artery without angina pectoris: Secondary | ICD-10-CM | POA: Diagnosis not present

## 2018-07-05 DIAGNOSIS — I340 Nonrheumatic mitral (valve) insufficiency: Secondary | ICD-10-CM | POA: Diagnosis not present

## 2018-07-05 DIAGNOSIS — R0602 Shortness of breath: Secondary | ICD-10-CM | POA: Diagnosis not present

## 2018-07-05 DIAGNOSIS — I10 Essential (primary) hypertension: Secondary | ICD-10-CM | POA: Diagnosis not present

## 2018-07-05 DIAGNOSIS — I351 Nonrheumatic aortic (valve) insufficiency: Secondary | ICD-10-CM | POA: Diagnosis not present

## 2018-07-05 DIAGNOSIS — Z9861 Coronary angioplasty status: Secondary | ICD-10-CM | POA: Diagnosis not present

## 2018-07-12 DIAGNOSIS — R079 Chest pain, unspecified: Secondary | ICD-10-CM | POA: Diagnosis not present

## 2018-07-19 DIAGNOSIS — R0602 Shortness of breath: Secondary | ICD-10-CM | POA: Diagnosis not present

## 2018-07-19 DIAGNOSIS — E782 Mixed hyperlipidemia: Secondary | ICD-10-CM | POA: Diagnosis not present

## 2018-07-19 DIAGNOSIS — I340 Nonrheumatic mitral (valve) insufficiency: Secondary | ICD-10-CM | POA: Diagnosis not present

## 2018-07-19 DIAGNOSIS — I2510 Atherosclerotic heart disease of native coronary artery without angina pectoris: Secondary | ICD-10-CM | POA: Diagnosis not present

## 2018-07-19 DIAGNOSIS — I10 Essential (primary) hypertension: Secondary | ICD-10-CM | POA: Diagnosis not present

## 2018-07-19 DIAGNOSIS — I351 Nonrheumatic aortic (valve) insufficiency: Secondary | ICD-10-CM | POA: Diagnosis not present

## 2018-07-19 DIAGNOSIS — Z9861 Coronary angioplasty status: Secondary | ICD-10-CM | POA: Diagnosis not present

## 2018-07-24 DIAGNOSIS — I2510 Atherosclerotic heart disease of native coronary artery without angina pectoris: Secondary | ICD-10-CM | POA: Diagnosis not present

## 2018-07-29 DIAGNOSIS — I340 Nonrheumatic mitral (valve) insufficiency: Secondary | ICD-10-CM | POA: Diagnosis not present

## 2018-07-29 DIAGNOSIS — I10 Essential (primary) hypertension: Secondary | ICD-10-CM | POA: Diagnosis not present

## 2018-07-29 DIAGNOSIS — Z9861 Coronary angioplasty status: Secondary | ICD-10-CM | POA: Diagnosis not present

## 2018-07-29 DIAGNOSIS — I351 Nonrheumatic aortic (valve) insufficiency: Secondary | ICD-10-CM | POA: Diagnosis not present

## 2018-09-16 ENCOUNTER — Ambulatory Visit (INDEPENDENT_AMBULATORY_CARE_PROVIDER_SITE_OTHER): Payer: BLUE CROSS/BLUE SHIELD | Admitting: Family Medicine

## 2018-09-16 ENCOUNTER — Encounter: Payer: Self-pay | Admitting: Family Medicine

## 2018-09-16 VITALS — BP 161/79 | HR 66 | Temp 97.6°F | Ht 70.0 in | Wt 247.6 lb

## 2018-09-16 DIAGNOSIS — B9789 Other viral agents as the cause of diseases classified elsewhere: Secondary | ICD-10-CM | POA: Diagnosis not present

## 2018-09-16 DIAGNOSIS — J069 Acute upper respiratory infection, unspecified: Secondary | ICD-10-CM

## 2018-09-16 DIAGNOSIS — R6883 Chills (without fever): Secondary | ICD-10-CM | POA: Diagnosis not present

## 2018-09-16 LAB — VERITOR FLU A/B WAIVED
Influenza A: NEGATIVE
Influenza B: NEGATIVE

## 2018-09-16 MED ORDER — HYDROCOD POLST-CPM POLST ER 10-8 MG/5ML PO SUER: 5.0000 mL | Freq: Every evening | ORAL | 0 refills | Status: DC | PRN

## 2018-09-16 MED ORDER — PREDNISONE 20 MG PO TABS: 40.0000 mg | ORAL_TABLET | Freq: Every day | ORAL | 0 refills | Status: DC

## 2018-09-16 MED ORDER — GUAIFENESIN ER 600 MG PO TB12: 600.0000 mg | ORAL_TABLET | Freq: Two times a day (BID) | ORAL | 0 refills | Status: DC | PRN

## 2018-09-16 NOTE — Progress Notes (Signed)
BP (!) 161/79 (BP Location: Left Arm, Patient Position: Sitting, Cuff Size: Large)   Pulse 66   Temp 97.6 F (36.4 C)   Ht 5\' 10"  (1.778 m)   Wt 247 lb 9 oz (112.3 kg)   SpO2 97%   BMI 35.52 kg/m    Subjective:    Patient ID: Ricky Melton, male    DOB: 06-03-1951, 68 y.o.   MRN: [id number]  HPI: Ricky Melton is a 68 y.o. male  Chief Complaint  Patient presents with  . Cough    chest congestion, headache, nasal drainage    1 week of frontal headache, wheezing, productive cough, sinus pain and pressure, chills, lightheadedness, possible fevers. Taking NSAIDs and one dose of alka seltzer last night which did help some. Denies Cp, SOB, N/V/D, body aches. No sick contacts.   Relevant past medical, surgical, family and social history reviewed and updated as indicated. Interim medical history since our last visit reviewed. Allergies and medications reviewed and updated.  Review of Systems  Per HPI unless specifically indicated above     Objective:    BP (!) 161/79 (BP Location: Left Arm, Patient Position: Sitting, Cuff Size: Large)   Pulse 66   Temp 97.6 F (36.4 C)   Ht 5\' 10"  (1.778 m)   Wt 247 lb 9 oz (112.3 kg)   SpO2 97%   BMI 35.52 kg/m   Wt Readings from Last 3 Encounters:  09/16/18 247 lb 9 oz (112.3 kg)  06/27/18 246 lb (111.6 kg)  05/10/18 245 lb (111.1 kg)    Physical Exam Vitals signs and nursing note reviewed.  Constitutional:      Appearance: Normal appearance. He is well-developed. He is not ill-appearing.  HENT:     Head: Atraumatic.     Right Ear: Tympanic membrane and external ear normal.     Left Ear: Tympanic membrane and external ear normal.     Nose: Congestion present.     Mouth/Throat:     Pharynx: Posterior oropharyngeal erythema present. No oropharyngeal exudate.  Eyes:     Conjunctiva/sclera: Conjunctivae normal.     Pupils: Pupils are equal, round, and reactive to light.  Neck:     Musculoskeletal: Normal range of motion and  neck supple.  Cardiovascular:     Rate and Rhythm: Normal rate and regular rhythm.  Pulmonary:     Effort: Pulmonary effort is normal. No respiratory distress.     Breath sounds: Wheezing present. No rales.  Musculoskeletal: Normal range of motion.  Lymphadenopathy:     Cervical: No cervical adenopathy.  Skin:    General: Skin is warm and dry.  Neurological:     Mental Status: He is alert and oriented to person, place, and time.  Psychiatric:        Behavior: Behavior normal.     Results for orders placed or performed in visit on 09/16/18  Veritor Flu A/B Waived  Result Value Ref Range   Influenza A Negative Negative   Influenza B Negative Negative      Assessment & Plan:   Problem List Items Addressed This Visit    None    Visit Diagnoses    Viral URI with cough    -  Primary   Tx with prednisone burst, tussionex at bedtime, and supportive care. Sedation precautions reviewed. F/u if not improving over next few days   Relevant Orders   Veritor Flu A/B Waived (Completed)       Follow  up plan: Return if symptoms worsen or fail to improve.

## 2018-09-20 ENCOUNTER — Telehealth: Payer: Self-pay | Admitting: Family Medicine

## 2018-09-20 MED ORDER — AZITHROMYCIN 250 MG PO TABS: ORAL_TABLET | ORAL | 0 refills | Status: DC

## 2018-09-20 NOTE — Telephone Encounter (Signed)
Abx sent, follow up in clinic if not improving  Copied from [place] [phone]. Topic: General - Other >> Sep 20, 2018  9:50 AM Carolyn Stare wrote:  Pt said he is still having congestion in his chest and is asking if something else can be called in .   Pharmacy [facility]

## 2018-09-20 NOTE — Telephone Encounter (Signed)
Patient notified.,

## 2018-10-31 ENCOUNTER — Other Ambulatory Visit: Payer: Self-pay | Admitting: Family Medicine

## 2018-10-31 DIAGNOSIS — I10 Essential (primary) hypertension: Secondary | ICD-10-CM

## 2018-10-31 NOTE — Telephone Encounter (Signed)
Requested Prescriptions  Pending Prescriptions Disp Refills  . carvedilol (COREG) 25 MG tablet [Pharmacy Med Name: CARVEDILOL 25 MG TABLET] 180 tablet 1    Sig: TAKE 1 TABLET BY MOUTH TWICE A DAY WITH A MEAL     Cardiovascular:  Beta Blockers Failed - 10/31/2018  2:24 AM      Failed - Last BP in normal range    BP Readings from Last 1 Encounters:  09/16/18 (!) 161/79         Passed - Last Heart Rate in normal range    Pulse Readings from Last 1 Encounters:  09/16/18 66         Passed - Valid encounter within last 6 months    Recent Outpatient Visits          1 month ago Viral URI with cough   [place], [place]   4 months ago Lumbar strain, initial encounter   [place], [place], [place]   6 months ago Essential hypertension   [place], Jeannette How, [place]   7 months ago Essential hypertension   [place], [place], [place]   11 months ago Essential hypertension   [place], Lilia Argue, [place]      Future Appointments            In 1 week  [facility]   In 2 weeks [place], Jeannette How, [place] [facility]

## 2018-11-06 ENCOUNTER — Ambulatory Visit: Payer: Medicare Other

## 2018-11-07 ENCOUNTER — Ambulatory Visit (INDEPENDENT_AMBULATORY_CARE_PROVIDER_SITE_OTHER): Payer: BLUE CROSS/BLUE SHIELD

## 2018-11-07 ENCOUNTER — Other Ambulatory Visit: Payer: Self-pay | Admitting: Family Medicine

## 2018-11-07 ENCOUNTER — Encounter: Payer: Self-pay | Admitting: Family Medicine

## 2018-11-07 VITALS — BP 144/70 | HR 69 | Temp 98.3°F | Resp 16 | Ht 66.5 in | Wt 246.6 lb

## 2018-11-07 DIAGNOSIS — Z0000 Encounter for general adult medical examination without abnormal findings: Secondary | ICD-10-CM | POA: Diagnosis not present

## 2018-11-07 NOTE — Progress Notes (Signed)
Subjective:   Ricky Melton is a 68 y.o. male who presents for Medicare Annual/Subsequent preventive examination.  Review of Systems:   Cardiac Risk Factors include: advanced age (>33men, >53 women);hypertension;dyslipidemia;male gender;obesity (BMI >30kg/m2)     Objective:    Vitals: BP (!) 144/70 (BP Location: Left Arm, Patient Position: Sitting, Cuff Size: Normal)   Pulse 69   Temp 98.3 F (36.8 C) (Temporal)   Resp 16   Ht 5' 6.5" (1.689 m)   Wt 246 lb 9.6 oz (111.9 kg)   BMI 39.21 kg/m   Body mass index is 39.21 kg/m.  Advanced Directives 11/07/2018 05/10/2018 10/25/2017  Does Patient Have a Medical Advance Directive? No No No  Would patient like information on creating a medical advance directive? Yes (MAU/Ambulatory/Procedural Areas - Information given) - Yes (MAU/Ambulatory/Procedural Areas - Information given)    Tobacco Social History   Tobacco Use  Smoking Status Never Smoker  Smokeless Tobacco Former Systems developer  . Types: Chew     Counseling given: Not Answered   Clinical Intake:  Pre-visit preparation completed: Yes  Pain : No/denies pain     Nutritional Status: BMI > 30  Obese Nutritional Risks: None Diabetes: No  How often do you need to have someone help you when you read instructions, pamphlets, or other written materials from your doctor or pharmacy?: 1 - Never What is the last grade level you completed in school?: some college   Interpreter Needed?: No  Information entered by :: Tiffany Hill,LPN   Past Medical History:  Diagnosis Date  . Benign hematuria   . CAD (coronary artery disease)   . Chronic kidney disease   . Hyperlipidemia   . Hypertension    Past Surgical History:  Procedure Laterality Date  . cardiac stents     x2  . COLONOSCOPY WITH PROPOFOL N/A 05/10/2018   Procedure: COLONOSCOPY WITH PROPOFOL;  Surgeon: Jonathon Bellows, MD;  Location: [facility] ENDOSCOPY;  Service: Gastroenterology;  Laterality: N/A;   Family History  Problem  Relation Age of Onset  . Hypertension Mother   . Diabetes Sister   . Diabetes Son    Social History   Socioeconomic History  . Marital status: Married    Spouse name: Not on file  . Number of children: Not on file  . Years of education: Not on file  . Highest education level: High school graduate  Occupational History  . Occupation: maintenence     Comment: part time   Social Needs  . Financial resource strain: Not hard at all  . Food insecurity:    Worry: Never true    Inability: Never true  . Transportation needs:    Medical: No    Non-medical: No  Tobacco Use  . Smoking status: Never Smoker  . Smokeless tobacco: Former Systems developer    Types: Chew  Substance and Sexual Activity  . Alcohol use: No    Alcohol/week: 0.0 standard drinks  . Drug use: No  . Sexual activity: Yes  Lifestyle  . Physical activity:    Days per week: 0 days    Minutes per session: 0 min  . Stress: Not at all  Relationships  . Social connections:    Talks on phone: More than three times a week    Gets together: More than three times a week    Attends religious service: More than 4 times per year    Active member of club or organization: Yes    Attends meetings of clubs  or organizations: More than 4 times per year    Relationship status: Married  Other Topics Concern  . Not on file  Social History Narrative   Plays golf with friends, mows yards     Outpatient Encounter Medications as of 11/07/2018  Medication Sig  . allopurinol (ZYLOPRIM) 300 MG tablet Take 1 tablet (300 mg total) by mouth daily.  Marland Kitchen amLODipine (NORVASC) 10 MG tablet Take 1 tablet (10 mg total) by mouth daily.  Marland Kitchen aspirin 81 MG tablet Take 81 mg by mouth daily.  . benazepril (LOTENSIN) 40 MG tablet Take 1 tablet (40 mg total) by mouth daily.  . carvedilol (COREG) 25 MG tablet TAKE 1 TABLET BY MOUTH TWICE A DAY WITH A MEAL  . clopidogrel (PLAVIX) 75 MG tablet Take 75 mg by mouth daily.  . hydrochlorothiazide (HYDRODIURIL) 25 MG  tablet Take 0.5 tablets (12.5 mg total) by mouth daily.  Marland Kitchen levothyroxine (SYNTHROID, LEVOTHROID) 50 MCG tablet Take 1 tablet (50 mcg total) by mouth daily.  . rosuvastatin (CRESTOR) 40 MG tablet Take 40 mg by mouth daily.  Marland Kitchen guaiFENesin (MUCINEX) 600 MG 12 hr tablet Take 1 tablet (600 mg total) by mouth 2 (two) times daily as needed. (Patient not taking: Reported on 11/07/2018)  . predniSONE (DELTASONE) 20 MG tablet Take 2 tablets (40 mg total) by mouth daily with breakfast. (Patient not taking: Reported on 11/07/2018)  . triamcinolone (NASACORT) 55 MCG/ACT AERO nasal inhaler PLACE 2 SPRAYS INTO THE NOSE DAILY. (Patient not taking: Reported on 11/07/2018)  . [DISCONTINUED] azithromycin (ZITHROMAX) 250 MG tablet Take 2 tabs day one, then 1 tab daily until complete (Patient not taking: Reported on 11/07/2018)  . [DISCONTINUED] chlorpheniramine-HYDROcodone (TUSSIONEX PENNKINETIC ER) 10-8 MG/5ML SUER Take 5 mLs by mouth at bedtime as needed.  . [DISCONTINUED] cyclobenzaprine (FLEXERIL) 10 MG tablet Take 1 tablet (10 mg total) by mouth 3 (three) times daily as needed for muscle spasms. (Patient not taking: Reported on 11/07/2018)  . [DISCONTINUED] traMADol (ULTRAM) 50 MG tablet Take 1 tablet (50 mg total) by mouth every 12 (twelve) hours as needed. (Patient not taking: Reported on 11/07/2018)   No facility-administered encounter medications on file as of 11/07/2018.     Activities of Daily Living In your present state of health, do you have any difficulty performing the following activities: 11/07/2018  Hearing? N  Vision? Y  Comment has dr in [place]   Difficulty concentrating or making decisions? N  Walking or climbing stairs? N  Dressing or bathing? N  Doing errands, shopping? N  Preparing Food and eating ? N  Using the Toilet? N  In the past six months, have you accidently leaked urine? N  Do you have problems with loss of bowel control? N  Managing your Medications? N  Managing your Finances?  N  Housekeeping or managing your Housekeeping? N  Some recent data might be hidden    Patient Care Team: Guadalupe Maple, MD as PCP - General (Family Medicine) Dionisio David, MD as Consulting Physician (Cardiology)   Assessment:   This is a routine wellness examination for [facility].  Exercise Activities and Dietary recommendations Current Exercise Habits: Home exercise routine, Type of exercise: walking(2 miles a day at work ), Intensity: Mild, Exercise limited by: None identified  Goals    . DIET - INCREASE WATER INTAKE     Recommend drinking at least 6-8 glasses of water a day        Fall Risk Fall Risk  11/07/2018 04/17/2018  10/25/2017 08/14/2017 10/14/2015  Falls in the past year? 0 No No No No   FALL RISK PREVENTION PERTAINING TO THE HOME:  Any stairs in or around the home? Yes  If so, are there any without handrails? No   Home free of loose throw rugs in walkways, pet beds, electrical cords, etc? Yes  Adequate lighting in your home to reduce risk of falls? Yes   ASSISTIVE DEVICES UTILIZED TO PREVENT FALLS:  Life alert? No  Use of a cane, walker or w/c? No  Grab bars in the bathroom? No  Shower chair or bench in shower? No  Elevated toilet seat or a handicapped toilet? No   DME ORDERS:  DME order needed?  No   TIMED UP AND GO:  Was the test performed? Yes .  Length of time to ambulate 10 feet: 10 sec.   GAIT:  Appearance of gait: Gait stead-fast without the use of an assistive device.  Education: Fall risk prevention has been discussed.  Intervention(s) required? No    Depression Screen PHQ 2/9 Scores 11/07/2018 04/17/2018 10/25/2017 08/14/2017  PHQ - 2 Score 0 0 0 0  PHQ- 9 Score - - 0 -    Cognitive Function     6CIT Screen 11/07/2018 11/07/2018 10/25/2017  What Year? 0 points 0 points 0 points  What month? 0 points 0 points 0 points  What time? 0 points 0 points 0 points  Count back from 20 0 points 0 points 0 points  Months in reverse 0 points 0  points 0 points  Repeat phrase 2 points 0 points 0 points  Total Score 2 0 0    Immunization History  Administered Date(s) Administered  . Influenza, High Dose Seasonal PF 06/27/2018  . Influenza,inj,Quad PF,6+ Mos 10/14/2015  . Pneumococcal Conjugate-13 04/13/2016  . Pneumococcal Polysaccharide-23 06/07/2006, 10/25/2017  . Td 01/23/2006  . Tdap 04/13/2016  . Zoster 08/21/2011    Qualifies for Shingles Vaccine? Yes  Zostavax completed 08/21/2011. Due for Shingrix. Education has been provided regarding the importance of this vaccine. Pt has been advised to call insurance company to determine out of pocket expense. Advised may also receive vaccine at local pharmacy or Health Dept. Verbalized acceptance and understanding.  Tdap: up to date   Flu Vaccine: up to date   Pneumococcal Vaccine: up to date   Screening Tests Health Maintenance  Topic Date Due  . TETANUS/TDAP  04/13/2026  . COLONOSCOPY  05/10/2028  . INFLUENZA VACCINE  Completed  . Hepatitis C Screening  Completed  . PNA vac Low Risk Adult  Completed   Cancer Screenings:  Colorectal Screening: Completed 05/10/2018. Repeat every 10 years   Lung Cancer Screening: (Low Dose CT Chest recommended if Age 46-80 years, 30 pack-year currently smoking OR have quit w/in 15years.) does not qualify.    Additional Screening:  Hepatitis C Screening: does qualify; Completed 10/14/2015  Vision Screening: Recommended annual ophthalmology exams for early detection of glaucoma and other disorders of the eye. Is the patient up to date with their annual eye exam?  Yes  Who is the provider or what is the name of the office in which the pt attends annual eye exams? 10/14/2015   Dental Screening: Recommended annual dental exams for proper oral hygiene  Community Resource Referral:  CRR required this visit?  Yes, patient requesting information on medicare advantage plans, referral sent to connected care to send patient some resources via  mail.      Plan:  I have personally reviewed and addressed the Medicare Annual Wellness questionnaire and have noted the following in the patient's chart:  A. Medical and social history B. Use of alcohol, tobacco or illicit drugs  C. Current medications and supplements D. Functional ability and status E.  Nutritional status F.  Physical activity G. Advance directives H. List of other physicians I.  Hospitalizations, surgeries, and ER visits in previous 12 months J.  [place] such as hearing and vision if needed, cognitive and depression L. Referrals and appointments   In addition, I have reviewed and discussed with patient certain preventive protocols, quality metrics, and best practice recommendations. A written personalized care plan for preventive services as well as general preventive health recommendations were provided to patient.   Signed,  Tyler Aas, LPN Nurse Health Advisor   Nurse Notes:none

## 2018-11-07 NOTE — Telephone Encounter (Signed)
Pt with appt today Please address refill at appt

## 2018-11-07 NOTE — Telephone Encounter (Signed)
Please see if he has enough to last until appt 3/5- otherwise I'll refill

## 2018-11-07 NOTE — Patient Instructions (Addendum)
Ricky Melton , Thank you for taking time to come for your Medicare Wellness Visit. I appreciate your ongoing commitment to your health goals. Please review the following plan we discussed and let me know if I can assist you in the future.   Screening recommendations/referrals: Colonoscopy: completed 05/10/2018 Recommended yearly ophthalmology/optometry visit for glaucoma screening and checkup Recommended yearly dental visit for hygiene and checkup  Vaccinations: Influenza vaccine: up to date  Pneumococcal vaccine: up to date Tdap vaccine: up to date Shingles vaccine: shingrix eligible, check with your insurance company for coverage     Advanced directives: Advance directive discussed with you today. I have provided a copy for you to complete at home and have notarized. Once this is complete please bring a copy in to our office so we can scan it into your chart.  Conditions/risks identified: decrease soda intake, increase water intake.   Next appointment: Follow up in one year for your annual wellness exam.   Preventive Care 68 Years and Older, Male Preventive care refers to lifestyle choices and visits with your health care provider that can promote health and wellness. What does preventive care include?  A yearly physical exam. This is also called an annual well check.  Dental exams once or twice a year.  Routine eye exams. Ask your health care provider how often you should have your eyes checked.  Personal lifestyle choices, including:  Daily care of your teeth and gums.  Regular physical activity.  Eating a healthy diet.  Avoiding tobacco and drug use.  Limiting alcohol use.  Practicing safe sex.  Taking low doses of aspirin every day.  Taking vitamin and mineral supplements as recommended by your health care provider. What happens during an annual well check? The services and screenings done by your health care provider during your annual well check will depend on your  age, overall health, lifestyle risk factors, and family history of disease. Counseling  Your health care provider may ask you questions about your:  Alcohol use.  Tobacco use.  Drug use.  Emotional well-being.  Home and relationship well-being.  Sexual activity.  Eating habits.  History of falls.  Memory and ability to understand (cognition).  Work and work Statistician. Screening  You may have the following tests or measurements:  Height, weight, and BMI.  Blood pressure.  Lipid and cholesterol levels. These may be checked every 5 years, or more frequently if you are over 88 years old.  Skin check.  Lung cancer screening. You may have this screening every year starting at age 47 if you have a 30-pack-year history of smoking and currently smoke or have quit within the past 15 years.  Fecal occult blood test (FOBT) of the stool. You may have this test every year starting at age 61.  Flexible sigmoidoscopy or colonoscopy. You may have a sigmoidoscopy every 5 years or a colonoscopy every 10 years starting at age 5.  Prostate cancer screening. Recommendations will vary depending on your family history and other risks.  Hepatitis C blood test.  Hepatitis B blood test.  Sexually transmitted disease (STD) testing.  Diabetes screening. This is done by checking your blood sugar (glucose) after you have not eaten for a while (fasting). You may have this done every 1-3 years.  Abdominal aortic aneurysm (AAA) screening. You may need this if you are a current or former smoker.  Osteoporosis. You may be screened starting at age 89 if you are at high risk. Talk with your health  care provider about your test results, treatment options, and if necessary, the need for more tests. Vaccines  Your health care provider may recommend certain vaccines, such as:  Influenza vaccine. This is recommended every year.  Tetanus, diphtheria, and acellular pertussis (Tdap, Td) vaccine. You  may need a Td booster every 10 years.  Zoster vaccine. You may need this after age 72.  Pneumococcal 13-valent conjugate (PCV13) vaccine. One dose is recommended after age 86.  Pneumococcal polysaccharide (PPSV23) vaccine. One dose is recommended after age 55. Talk to your health care provider about which screenings and vaccines you need and how often you need them. This information is not intended to replace advice given to you by your health care provider. Make sure you discuss any questions you have with your health care provider. Document Released: 09/24/2015 Document Revised: 05/17/2016 Document Reviewed: 06/29/2015 Elsevier Interactive Patient Education  2017 [place] Prevention in the Home Falls can cause injuries. They can happen to people of all ages. There are many things you can do to make your home safe and to help prevent falls. What can I do on the outside of my home?  Regularly fix the edges of walkways and driveways and fix any cracks.  Remove anything that might make you trip as you walk through a door, such as a raised step or threshold.  Trim any bushes or trees on the path to your home.  Use bright outdoor lighting.  Clear any walking paths of anything that might make someone trip, such as rocks or tools.  Regularly check to see if handrails are loose or broken. Make sure that both sides of any steps have handrails.  Any raised decks and porches should have guardrails on the edges.  Have any leaves, snow, or ice cleared regularly.  Use sand or salt on walking paths during winter.  Clean up any spills in your garage right away. This includes oil or grease spills. What can I do in the bathroom?  Use night lights.  Install grab bars by the toilet and in the tub and shower. Do not use towel bars as grab bars.  Use non-skid mats or decals in the tub or shower.  If you need to sit down in the shower, use a plastic, non-slip stool.  Keep the floor  dry. Clean up any water that spills on the floor as soon as it happens.  Remove soap buildup in the tub or shower regularly.  Attach bath mats securely with double-sided non-slip rug tape.  Do not have throw rugs and other things on the floor that can make you trip. What can I do in the bedroom?  Use night lights.  Make sure that you have a light by your bed that is easy to reach.  Do not use any sheets or blankets that are too big for your bed. They should not hang down onto the floor.  Have a firm chair that has side arms. You can use this for support while you get dressed.  Do not have throw rugs and other things on the floor that can make you trip. What can I do in the kitchen?  Clean up any spills right away.  Avoid walking on wet floors.  Keep items that you use a lot in easy-to-reach places.  If you need to reach something above you, use a strong step stool that has a grab bar.  Keep electrical cords out of the way.  Do not use floor  polish or wax that makes floors slippery. If you must use wax, use non-skid floor wax.  Do not have throw rugs and other things on the floor that can make you trip. What can I do with my stairs?  Do not leave any items on the stairs.  Make sure that there are handrails on both sides of the stairs and use them. Fix handrails that are broken or loose. Make sure that handrails are as long as the stairways.  Check any carpeting to make sure that it is firmly attached to the stairs. Fix any carpet that is loose or worn.  Avoid having throw rugs at the top or bottom of the stairs. If you do have throw rugs, attach them to the floor with carpet tape.  Make sure that you have a light switch at the top of the stairs and the bottom of the stairs. If you do not have them, ask someone to add them for you. What else can I do to help prevent falls?  Wear shoes that:  Do not have high heels.  Have rubber bottoms.  Are comfortable and fit you  well.  Are closed at the toe. Do not wear sandals.  If you use a stepladder:  Make sure that it is fully opened. Do not climb a closed stepladder.  Make sure that both sides of the stepladder are locked into place.  Ask someone to hold it for you, if possible.  Clearly mark and make sure that you can see:  Any grab bars or handrails.  First and last steps.  Where the edge of each step is.  Use tools that help you move around (mobility aids) if they are needed. These include:  Canes.  Walkers.  Scooters.  Crutches.  Turn on the lights when you go into a dark area. Replace any light bulbs as soon as they burn out.  Set up your furniture so you have a clear path. Avoid moving your furniture around.  If any of your floors are uneven, fix them.  If there are any pets around you, be aware of where they are.  Review your medicines with your doctor. Some medicines can make you feel dizzy. This can increase your chance of falling. Ask your doctor what other things that you can do to help prevent falls. This information is not intended to replace advice given to you by your health care provider. Make sure you discuss any questions you have with your health care provider. Document Released: 06/24/2009 Document Revised: 02/03/2016 Document Reviewed: 10/02/2014 Elsevier Interactive Patient Education  2017 [place] [employer] Calories are units of energy. Your body needs a certain amount of calories from food to keep you going throughout the day. When you eat more calories than your body needs, your body stores the extra calories as fat. When you eat fewer calories than your body needs, your body burns fat to get the energy it needs. Calorie counting means keeping track of how many calories you eat and drink each day. Calorie counting can be helpful if you need to lose weight. If you make sure to eat fewer calories than your body needs, you should lose  weight. Ask your health care provider what a healthy weight is for you. For calorie counting to work, you will need to eat the right number of calories in a day in order to lose a healthy amount of weight per week. A dietitian can help  you determine how many calories you need in a day and will give you suggestions on how to reach your calorie goal.  A healthy amount of weight to lose per week is usually 1-2 lb (0.5-0.9 kg). This usually means that your daily calorie intake should be reduced by 500-750 calories.  Eating 1,200 - 1,500 calories per day can help most women lose weight.  Eating 1,500 - 1,800 calories per day can help most men lose weight. What is my plan? My goal is to have __________ calories per day. If I have this many calories per day, I should lose around __________ pounds per week. What do I need to know about calorie counting? In order to meet your daily calorie goal, you will need to:  Find out how many calories are in each food you would like to eat. Try to do this before you eat.  Decide how much of the food you plan to eat.  Write down what you ate and how many calories it had. Doing this is called keeping a food log. To successfully lose weight, it is important to balance calorie counting with a healthy lifestyle that includes regular activity. Aim for 150 minutes of moderate exercise (such as walking) or 75 minutes of vigorous exercise (such as running) each week. Where do I find calorie information?  The number of calories in a food can be found on a Nutrition Facts label. If a food does not have a Nutrition Facts label, try to look up the calories online or ask your dietitian for help. Remember that calories are listed per serving. If you choose to have more than one serving of a food, you will have to multiply the calories per serving by the amount of servings you plan to eat. For example, the label on a package of bread might say that a serving size is 1 slice and  that there are 90 calories in a serving. If you eat 1 slice, you will have eaten 90 calories. If you eat 2 slices, you will have eaten 180 calories. How do I keep a food log? Immediately after each meal, record the following information in your food log:  What you ate. Don't forget to include toppings, sauces, and other extras on the food.  How much you ate. This can be measured in cups, ounces, or number of items.  How many calories each food and drink had.  The total number of calories in the meal. Keep your food log near you, such as in a small notebook in your pocket, or use a mobile app or website. Some programs will calculate calories for you and show you how many calories you have left for the day to meet your goal. What are some calorie counting tips?   Use your calories on foods and drinks that will fill you up and not leave you hungry: ? Some examples of foods that fill you up are nuts and nut butters, vegetables, lean proteins, and high-fiber foods like whole grains. High-fiber foods are foods with more than 5 g fiber per serving. ? Drinks such as sodas, specialty coffee drinks, alcohol, and juices have a lot of calories, yet do not fill you up.  Eat nutritious foods and avoid empty calories. Empty calories are calories you get from foods or beverages that do not have many vitamins or protein, such as candy, sweets, and soda. It is better to have a nutritious high-calorie food (such as an avocado) than a food with  few nutrients (such as a bag of chips).  Know how many calories are in the foods you eat most often. This will help you calculate calorie counts faster.  Pay attention to calories in drinks. Low-calorie drinks include water and unsweetened drinks.  Pay attention to nutrition labels for "low fat" or "fat free" foods. These foods sometimes have the same amount of calories or more calories than the full fat versions. They also often have added sugar, starch, or salt, to  make up for flavor that was removed with the fat.  Find a way of tracking calories that works for you. Get creative. Try different apps or programs if writing down calories does not work for you. What are some portion control tips?  Know how many calories are in a serving. This will help you know how many servings of a certain food you can have.  Use a measuring cup to measure serving sizes. You could also try weighing out portions on a kitchen scale. With time, you will be able to estimate serving sizes for some foods.  Take some time to put servings of different foods on your favorite plates, bowls, and cups so you know what a serving looks like.  Try not to eat straight from a bag or box. Doing this can lead to overeating. Put the amount you would like to eat in a cup or on a plate to make sure you are eating the right portion.  Use smaller plates, glasses, and bowls to prevent overeating.  Try not to multitask (for example, watch TV or use your computer) while eating. If it is time to eat, sit down at a table and enjoy your food. This will help you to know when you are full. It will also help you to be aware of what you are eating and how much you are eating. What are tips for following this plan? Reading food labels  Check the calorie count compared to the serving size. The serving size may be smaller than what you are used to eating.  Check the source of the calories. Make sure the food you are eating is high in vitamins and protein and low in saturated and trans fats. Shopping  Read nutrition labels while you shop. This will help you make healthy decisions before you decide to purchase your food.  Make a grocery list and stick to it. Cooking  Try to cook your favorite foods in a healthier way. For example, try baking instead of frying.  Use low-fat dairy products. Meal planning  Use more fruits and vegetables. Half of your plate should be fruits and vegetables.  Include  lean proteins like poultry and fish. How do I count calories when eating out?  Ask for smaller portion sizes.  Consider sharing an entree and sides instead of getting your own entree.  If you get your own entree, eat only half. Ask for a box at the beginning of your meal and put the rest of your entree in it so you are not tempted to eat it.  If calories are listed on the menu, choose the lower calorie options.  Choose dishes that include vegetables, fruits, whole grains, low-fat dairy products, and lean protein.  Choose items that are boiled, broiled, grilled, or steamed. Stay away from items that are buttered, battered, fried, or served with cream sauce. Items labeled "crispy" are usually fried, unless stated otherwise.  Choose water, low-fat milk, unsweetened iced tea, or other drinks without added sugar. If  you want an alcoholic beverage, choose a lower calorie option such as a glass of wine or light beer.  Ask for dressings, sauces, and syrups on the side. These are usually high in calories, so you should limit the amount you eat.  If you want a salad, choose a garden salad and ask for grilled meats. Avoid extra toppings like bacon, cheese, or fried items. Ask for the dressing on the side, or ask for olive oil and vinegar or lemon to use as dressing.  Estimate how many servings of a food you are given. For example, a serving of cooked rice is  cup or about the size of half a baseball. Knowing serving sizes will help you be aware of how much food you are eating at restaurants. The list below tells you how big or small some common portion sizes are based on everyday objects: ? 1 oz-4 stacked dice. ? 3 oz-1 deck of cards. ? 1 tsp-1 die. ? 1 Tbsp- a ping-pong ball. ? 2 Tbsp-1 ping-pong ball. ?  cup- baseball. ? 1 cup-1 baseball. Summary  Calorie counting means keeping track of how many calories you eat and drink each day. If you eat fewer calories than your body needs, you should  lose weight.  A healthy amount of weight to lose per week is usually 1-2 lb (0.5-0.9 kg). This usually means reducing your daily calorie intake by 500-750 calories.  The number of calories in a food can be found on a Nutrition Facts label. If a food does not have a Nutrition Facts label, try to look up the calories online or ask your dietitian for help.  Use your calories on foods and drinks that will fill you up, and not on foods and drinks that will leave you hungry.  Use smaller plates, glasses, and bowls to prevent overeating. This information is not intended to replace advice given to you by your health care provider. Make sure you discuss any questions you have with your health care provider. Document Released: 08/28/2005 Document Revised: 05/17/2018 Document Reviewed: 07/28/2016 Elsevier Interactive Patient Education  2019 [employer].   Exercising to [employer] Exercise is structured, repetitive physical activity to improve fitness and health. Getting regular exercise is important for everyone. It is especially important if you are overweight. Being overweight increases your risk of heart disease, stroke, diabetes, high blood pressure, and several types of cancer. Reducing your calorie intake and exercising can help you lose weight. Exercise is usually categorized as moderate or vigorous intensity. To lose weight, most people need to do a certain amount of moderate-intensity or vigorous-intensity exercise each week. Moderate-intensity exercise  Moderate-intensity exercise is any activity that gets you moving enough to burn at least three times more energy (calories) than if you were sitting. Examples of moderate exercise include:  Walking a mile in 15 minutes.  Doing light yard work.  Biking at an easy pace. Most people should get at least 150 minutes (2 hours and 30 minutes) a week of moderate-intensity exercise to maintain their body weight. Vigorous-intensity  exercise Vigorous-intensity exercise is any activity that gets you moving enough to burn at least six times more calories than if you were sitting. When you exercise at this intensity, you should be working hard enough that you are not able to carry on a conversation. Examples of vigorous exercise include:  Running.  Playing a team sport, such as football, basketball, and soccer.  Jumping rope. Most people should get at least 75 minutes (  1 hour and 15 minutes) a week of vigorous-intensity exercise to maintain their body weight. How can exercise affect me? When you exercise enough to burn more calories than you eat, you lose weight. Exercise also reduces body fat and builds muscle. The more muscle you have, the more calories you burn. Exercise also:  Improves mood.  Reduces stress and tension.  Improves your overall fitness, flexibility, and endurance.  Increases bone strength. The amount of exercise you need to lose weight depends on:  Your age.  The type of exercise.  Any health conditions you have.  Your overall physical ability. Talk to your health care provider about how much exercise you need and what types of activities are safe for you. What actions can I take to lose weight? Nutrition   Make changes to your diet as told by your health care provider or diet and nutrition specialist (dietitian). This may include: ? Eating fewer calories. ? Eating more protein. ? Eating less unhealthy fats. ? Eating a diet that includes fresh fruits and vegetables, whole grains, low-fat dairy products, and lean protein. ? Avoiding foods with added fat, salt, and sugar.  Drink plenty of water while you exercise to prevent dehydration or heat stroke. Activity  Choose an activity that you enjoy and set realistic goals. Your health care provider can help you make an exercise plan that works for you.  Exercise at a moderate or vigorous intensity most days of the week. ? The intensity of  exercise may vary from person to person. You can tell how intense a workout is for you by paying attention to your breathing and heartbeat. Most people will notice their breathing and heartbeat get faster with more intense exercise.  Do resistance training twice each week, such as: ? Push-ups. ? Sit-ups. ? Lifting weights. ? Using resistance bands.  Getting short amounts of exercise can be just as helpful as long structured periods of exercise. If you have trouble finding time to exercise, try to include exercise in your daily routine. ? Get up, stretch, and walk around every 30 minutes throughout the day. ? Go for a walk during your lunch break. ? Park your car farther away from your destination. ? If you take public transportation, get off one stop early and walk the rest of the way. ? Make phone calls while standing up and walking around. ? Take the stairs instead of elevators or escalators.  Wear comfortable clothes and shoes with good support.  Do not exercise so much that you hurt yourself, feel dizzy, or get very short of breath. Where to find more information  [place] Department of Health and Human Services: [employer]  Centers for Disease Control and Prevention (CDC): http://www.wolf.info/ Contact a health care provider:  Before starting a new exercise program.  If you have questions or concerns about your weight.  If you have a medical problem that keeps you from exercising. Get help right away if you have any of the following while exercising:  Injury.  Dizziness.  Difficulty breathing or shortness of breath that does not go away when you stop exercising.  Chest pain.  Rapid heartbeat. Summary  Being overweight increases your risk of heart disease, stroke, diabetes, high blood pressure, and several types of cancer.  Losing weight happens when you burn more calories than you eat.  Reducing the amount of calories you eat in addition to getting regular moderate or vigorous  exercise each week helps you lose weight. This information is not intended  to replace advice given to you by your health care provider. Make sure you discuss any questions you have with your health care provider. Document Released: 09/30/2010 Document Revised: 09/10/2017 Document Reviewed: 09/10/2017 Elsevier Interactive Patient Education  2019 [employer].

## 2018-11-08 ENCOUNTER — Ambulatory Visit: Payer: Self-pay

## 2018-11-08 NOTE — Telephone Encounter (Signed)
Patient stated he had enough medication to last til appointment.

## 2018-11-14 ENCOUNTER — Encounter: Payer: Self-pay | Admitting: Family Medicine

## 2018-11-14 ENCOUNTER — Ambulatory Visit (INDEPENDENT_AMBULATORY_CARE_PROVIDER_SITE_OTHER): Payer: BLUE CROSS/BLUE SHIELD | Admitting: Family Medicine

## 2018-11-14 VITALS — BP 138/74 | HR 64 | Temp 98.1°F | Ht 66.14 in | Wt 248.2 lb

## 2018-11-14 DIAGNOSIS — Z7189 Other specified counseling: Secondary | ICD-10-CM | POA: Diagnosis not present

## 2018-11-14 DIAGNOSIS — I2583 Coronary atherosclerosis due to lipid rich plaque: Secondary | ICD-10-CM | POA: Diagnosis not present

## 2018-11-14 DIAGNOSIS — I10 Essential (primary) hypertension: Secondary | ICD-10-CM | POA: Diagnosis not present

## 2018-11-14 DIAGNOSIS — J019 Acute sinusitis, unspecified: Secondary | ICD-10-CM | POA: Diagnosis not present

## 2018-11-14 DIAGNOSIS — Z0000 Encounter for general adult medical examination without abnormal findings: Secondary | ICD-10-CM | POA: Diagnosis not present

## 2018-11-14 DIAGNOSIS — E785 Hyperlipidemia, unspecified: Secondary | ICD-10-CM | POA: Diagnosis not present

## 2018-11-14 DIAGNOSIS — E039 Hypothyroidism, unspecified: Secondary | ICD-10-CM

## 2018-11-14 DIAGNOSIS — I2510 Atherosclerotic heart disease of native coronary artery without angina pectoris: Secondary | ICD-10-CM

## 2018-11-14 DIAGNOSIS — M1000 Idiopathic gout, unspecified site: Secondary | ICD-10-CM

## 2018-11-14 DIAGNOSIS — N400 Enlarged prostate without lower urinary tract symptoms: Secondary | ICD-10-CM | POA: Diagnosis not present

## 2018-11-14 LAB — URINALYSIS, ROUTINE W REFLEX MICROSCOPIC
Bilirubin, UA: NEGATIVE
Glucose, UA: NEGATIVE
Ketones, UA: NEGATIVE
Leukocytes, UA: NEGATIVE
Nitrite, UA: NEGATIVE
PH UA: 5.5 (ref 5.0–7.5)
Specific Gravity, UA: 1.01 (ref 1.005–1.030)
UUROB: 0.2 mg/dL (ref 0.2–1.0)

## 2018-11-14 LAB — MICROSCOPIC EXAMINATION
Bacteria, UA: NONE SEEN
WBC, UA: NONE SEEN /hpf (ref 0–5)

## 2018-11-14 MED ORDER — HYDROCHLOROTHIAZIDE 12.5 MG PO TABS: 12.5000 mg | ORAL_TABLET | Freq: Every day | ORAL | 4 refills | Status: DC

## 2018-11-14 MED ORDER — TRIAMCINOLONE ACETONIDE 55 MCG/ACT NA AERO: 2.0000 | INHALATION_SPRAY | Freq: Every day | NASAL | 11 refills | Status: DC

## 2018-11-14 MED ORDER — AMOXICILLIN-POT CLAVULANATE 875-125 MG PO TABS: 1.0000 | ORAL_TABLET | Freq: Two times a day (BID) | ORAL | 0 refills | Status: DC

## 2018-11-14 MED ORDER — ROSUVASTATIN CALCIUM 40 MG PO TABS: 40.0000 mg | ORAL_TABLET | Freq: Every day | ORAL | 4 refills | Status: DC

## 2018-11-14 MED ORDER — AMLODIPINE BESYLATE 10 MG PO TABS: 10.0000 mg | ORAL_TABLET | Freq: Every day | ORAL | 4 refills | Status: DC

## 2018-11-14 MED ORDER — BENAZEPRIL HCL 40 MG PO TABS: 40.0000 mg | ORAL_TABLET | Freq: Every day | ORAL | 4 refills | Status: DC

## 2018-11-14 MED ORDER — ALLOPURINOL 300 MG PO TABS: 300.0000 mg | ORAL_TABLET | Freq: Every day | ORAL | 4 refills | Status: DC

## 2018-11-14 MED ORDER — CARVEDILOL 25 MG PO TABS: 25.0000 mg | ORAL_TABLET | Freq: Two times a day (BID) | ORAL | 4 refills | Status: DC

## 2018-11-14 MED ORDER — LEVOTHYROXINE SODIUM 50 MCG PO TABS: 50.0000 ug | ORAL_TABLET | Freq: Every day | ORAL | 4 refills | Status: DC

## 2018-11-14 NOTE — Assessment & Plan Note (Signed)
Discussed wt loss 

## 2018-11-14 NOTE — Progress Notes (Addendum)
BP 138/74 (BP Location: Left Arm)   Pulse 64   Temp 98.1 F (36.7 C)   Ht 5' 6.14" (1.68 m)   Wt 248 lb 4 oz (112.6 kg)   SpO2 98%   BMI 39.90 kg/m    Subjective:    Patient ID: Ricky Melton, male    DOB: 27-Dec-1950, 68 y.o.   MRN: [id number]  HPI: LUISANGEL WAINRIGHT is a 68 y.o. male  Chief Complaint  Patient presents with  . Annual Exam  Patient still with some sinus drainage congestion symptoms took Z-Pak and prednisone with some relief but seems to have come back not bad enough right now to have made a separate appointment but is concerning.  Patient all in all doing well no complaints no chest pain chest tightness symptoms taking blood pressure medications without problems or issues good control. Cholesterol doing well thyroid gout all doing well with no symptoms or problems.   Relevant past medical, surgical, family and social history reviewed and updated as indicated. Interim medical history since our last visit reviewed. Allergies and medications reviewed and updated.  Review of Systems  Constitutional: Negative.   HENT: Negative.   Eyes: Negative.   Respiratory: Negative.   Cardiovascular: Negative.   Gastrointestinal: Negative.   Endocrine: Negative.   Genitourinary: Negative.   Musculoskeletal: Negative.   Skin: Negative.   Allergic/Immunologic: Negative.   Neurological: Negative.   Hematological: Negative.   Psychiatric/Behavioral: Negative.     Per HPI unless specifically indicated above     Objective:    BP 138/74 (BP Location: Left Arm)   Pulse 64   Temp 98.1 F (36.7 C)   Ht 5' 6.14" (1.68 m)   Wt 248 lb 4 oz (112.6 kg)   SpO2 98%   BMI 39.90 kg/m   Wt Readings from Last 3 Encounters:  11/14/18 248 lb 4 oz (112.6 kg)  11/07/18 246 lb 9.6 oz (111.9 kg)  09/16/18 247 lb 9 oz (112.3 kg)    Physical Exam Constitutional:      Appearance: He is well-developed.  HENT:     Head: Normocephalic and atraumatic.     Right Ear: External ear  normal.     Left Ear: External ear normal.  Eyes:     Conjunctiva/sclera: Conjunctivae normal.     Pupils: Pupils are equal, round, and reactive to light.  Neck:     Musculoskeletal: Normal range of motion and neck supple.  Cardiovascular:     Rate and Rhythm: Normal rate and regular rhythm.     Heart sounds: Normal heart sounds.  Pulmonary:     Effort: Pulmonary effort is normal.     Breath sounds: Normal breath sounds.  Abdominal:     General: Bowel sounds are normal.     Palpations: Abdomen is soft. There is no hepatomegaly or splenomegaly.  Genitourinary:    Penis: Normal.      Prostate: Normal.     Rectum: Normal.  Musculoskeletal: Normal range of motion.  Skin:    Findings: No erythema or rash.  Neurological:     Mental Status: He is alert and oriented to person, place, and time.     Deep Tendon Reflexes: Reflexes are normal and symmetric.  Psychiatric:        Behavior: Behavior normal.        Thought Content: Thought content normal.        Judgment: Judgment normal.         Assessment &  Plan:   Problem List Items Addressed This Visit      Cardiovascular and Mediastinum   CAD (coronary artery disease)    The current medical regimen is effective;  continue present plan and medications.       Relevant Medications   amLODipine (NORVASC) 10 MG tablet   rosuvastatin (CRESTOR) 40 MG tablet   hydrochlorothiazide (HYDRODIURIL) 12.5 MG tablet   carvedilol (COREG) 25 MG tablet   benazepril (LOTENSIN) 40 MG tablet   Hypertension - Primary    The current medical regimen is effective;  continue present plan and medications.       Relevant Medications   amLODipine (NORVASC) 10 MG tablet   rosuvastatin (CRESTOR) 40 MG tablet   hydrochlorothiazide (HYDRODIURIL) 12.5 MG tablet   carvedilol (COREG) 25 MG tablet   benazepril (LOTENSIN) 40 MG tablet   Other Relevant Orders   CBC with Differential/Platelet   Comprehensive metabolic panel   Urinalysis, Routine w  reflex microscopic     Endocrine   Hypothyroidism    The current medical regimen is effective;  continue present plan and medications.       Relevant Medications   levothyroxine (SYNTHROID, LEVOTHROID) 50 MCG tablet   carvedilol (COREG) 25 MG tablet   Other Relevant Orders   TSH     Genitourinary   BPH (benign prostatic hyperplasia)    The current medical regimen is effective;  continue present plan and medications.       Relevant Orders   PSA     Other   Hyperlipidemia   Relevant Medications   amLODipine (NORVASC) 10 MG tablet   rosuvastatin (CRESTOR) 40 MG tablet   hydrochlorothiazide (HYDRODIURIL) 12.5 MG tablet   carvedilol (COREG) 25 MG tablet   benazepril (LOTENSIN) 40 MG tablet   Other Relevant Orders   Lipid Panel w/o Chol/HDL Ratio   Gout    The current medical regimen is effective;  continue present plan and medications.       Relevant Medications   allopurinol (ZYLOPRIM) 300 MG tablet   Other Relevant Orders   Uric acid   Morbid obesity ([employer])    Discussed wt loss      Advanced care planning/counseling discussion    A voluntary discussion about advanced care planning including explanation and discussion of advanced directives was extentively discussed with the patient.  Explained about the healthcare proxy and living will was reviewed and packet with forms with expiration of how to fill them out was given.  Time spent: Encounter 16+ min individuals present: Patient       Other Visit Diagnoses    Encounter for Medicare annual wellness exam       Acute sinusitis, recurrence not specified, unspecified location       Relevant Medications   amoxicillin-clavulanate (AUGMENTIN) 875-125 MG tablet   triamcinolone (NASACORT) 55 MCG/ACT AERO nasal inhaler       Follow up plan: Return in about 6 months (around 05/17/2019) for BMP,  Lipids, ALT, AST.

## 2018-11-14 NOTE — Assessment & Plan Note (Signed)
The current medical regimen is effective;  continue present plan and medications.  

## 2018-11-14 NOTE — Assessment & Plan Note (Signed)
A voluntary discussion about advanced care planning including explanation and discussion of advanced directives was extentively discussed with the patient.  Explained about the healthcare proxy and living will was reviewed and packet with forms with expiration of how to fill them out was given.  Time spent: Encounter 16+ min individuals present: Patient 

## 2018-11-15 LAB — CBC WITH DIFFERENTIAL/PLATELET
BASOS ABS: 0.1 10*3/uL (ref 0.0–0.2)
Basos: 1 %
EOS (ABSOLUTE): 0.2 10*3/uL (ref 0.0–0.4)
Eos: 3 %
HEMOGLOBIN: 14.3 g/dL (ref 13.0–17.7)
Hematocrit: 41 % (ref 37.5–51.0)
Immature Grans (Abs): 0 10*3/uL (ref 0.0–0.1)
Immature Granulocytes: 0 %
LYMPHS ABS: 2.2 10*3/uL (ref 0.7–3.1)
Lymphs: 29 %
MCH: 29.2 pg (ref 26.6–33.0)
MCHC: 34.9 g/dL (ref 31.5–35.7)
MCV: 84 fL (ref 79–97)
MONOCYTES: 7 %
Monocytes Absolute: 0.5 10*3/uL (ref 0.1–0.9)
NEUTROS ABS: 4.6 10*3/uL (ref 1.4–7.0)
Neutrophils: 60 %
PLATELETS: 233 10*3/uL (ref 150–450)
RBC: 4.89 x10E6/uL (ref 4.14–5.80)
RDW: 14.2 % (ref 11.6–15.4)
WBC: 7.6 10*3/uL (ref 3.4–10.8)

## 2018-11-15 LAB — PSA: Prostate Specific Ag, Serum: 1 ng/mL (ref 0.0–4.0)

## 2018-11-15 LAB — COMPREHENSIVE METABOLIC PANEL
ALK PHOS: 52 IU/L (ref 39–117)
ALT: 21 IU/L (ref 0–44)
AST: 25 IU/L (ref 0–40)
Albumin/Globulin Ratio: 1.8 (ref 1.2–2.2)
Albumin: 4.4 g/dL (ref 3.8–4.8)
BILIRUBIN TOTAL: 0.3 mg/dL (ref 0.0–1.2)
BUN / CREAT RATIO: 18 (ref 10–24)
BUN: 23 mg/dL (ref 8–27)
CHLORIDE: 104 mmol/L (ref 96–106)
CO2: 20 mmol/L (ref 20–29)
CREATININE: 1.31 mg/dL — AB (ref 0.76–1.27)
Calcium: 9.5 mg/dL (ref 8.6–10.2)
GFR calc Af Amer: 64 mL/min/{1.73_m2} (ref >59)
GFR calc non Af Amer: 56 mL/min/{1.73_m2} — ABNORMAL LOW (ref >59)
GLUCOSE: 110 mg/dL — AB (ref 65–99)
Globulin, Total: 2.5 g/dL (ref 1.5–4.5)
Potassium: 4.5 mmol/L (ref 3.5–5.2)
Sodium: 139 mmol/L (ref 134–144)
Total Protein: 6.9 g/dL (ref 6.0–8.5)

## 2018-11-15 LAB — LIPID PANEL W/O CHOL/HDL RATIO
CHOLESTEROL TOTAL: 107 mg/dL (ref 100–199)
HDL: 35 mg/dL — AB (ref >39)
LDL Calculated: 53 mg/dL (ref 0–99)
TRIGLYCERIDES: 93 mg/dL (ref 0–149)
VLDL CHOLESTEROL CAL: 19 mg/dL (ref 5–40)

## 2018-11-15 LAB — URIC ACID: URIC ACID: 4.3 mg/dL (ref 3.7–8.6)

## 2018-11-15 LAB — TSH: TSH: 2.36 u[IU]/mL (ref 0.450–4.500)

## 2018-11-18 ENCOUNTER — Encounter: Payer: Self-pay | Admitting: Family Medicine

## 2018-11-29 DIAGNOSIS — I10 Essential (primary) hypertension: Secondary | ICD-10-CM | POA: Diagnosis not present

## 2018-11-29 DIAGNOSIS — I2510 Atherosclerotic heart disease of native coronary artery without angina pectoris: Secondary | ICD-10-CM | POA: Diagnosis not present

## 2018-11-29 DIAGNOSIS — I340 Nonrheumatic mitral (valve) insufficiency: Secondary | ICD-10-CM | POA: Diagnosis not present

## 2018-11-29 DIAGNOSIS — I351 Nonrheumatic aortic (valve) insufficiency: Secondary | ICD-10-CM | POA: Diagnosis not present

## 2018-12-24 ENCOUNTER — Other Ambulatory Visit: Payer: Self-pay | Admitting: Family Medicine

## 2018-12-30 ENCOUNTER — Telehealth: Payer: Self-pay | Admitting: Family Medicine

## 2018-12-30 ENCOUNTER — Other Ambulatory Visit: Payer: Self-pay | Admitting: Family Medicine

## 2018-12-30 DIAGNOSIS — I10 Essential (primary) hypertension: Secondary | ICD-10-CM

## 2018-12-30 MED ORDER — HYDROCHLOROTHIAZIDE 12.5 MG PO TABS: 12.5000 mg | ORAL_TABLET | Freq: Every day | ORAL | 4 refills | Status: DC

## 2018-12-30 NOTE — Telephone Encounter (Signed)
Pharmacy requesting refill but dose that they are wanting is 25 mg and for pt to take 1 tablet. This dose was cut in half 12.5 mg  On 03/08/18- will need new order written. Last refill 10/25/17.

## 2018-12-30 NOTE — Telephone Encounter (Signed)
Copied from [place] [id number]. Topic: Quick Communication - Rx Refill/Question >> Dec 30, 2018 10:06 AM Pauline Good wrote: Medication: hydrochlorothiazide 12.5 mg  Has the patient contacted their pharmacy?  yes (Agent: If no, request that the patient contact the pharmacy for the refill.) (Agent: If yes, when and what did the pharmacy advise?)  Preferred Pharmacy (with phone number or street name): [facility]   Agent: Please be advised that RX refills may take up to 3 business days. We ask that you follow-up with your pharmacy.

## 2018-12-30 NOTE — Telephone Encounter (Signed)
Copied from [place] [id number]. Topic: Quick Communication - Rx Refill/Question >> Dec 30, 2018 10:06 AM Pauline Good wrote: Medication: hydrochlorothiazide 12.5 mg  Has the patient contacted their pharmacy?} yes (Preferred Pharmacy [facility] phone number [facility] street [facility]: [facility]   Agent: Please be advised that RX refills may take up to 3 business days. We ask that you follow-up with your pharmacy.

## 2019-02-10 ENCOUNTER — Telehealth: Payer: Self-pay | Admitting: Family Medicine

## 2019-02-10 DIAGNOSIS — I10 Essential (primary) hypertension: Secondary | ICD-10-CM

## 2019-02-10 DIAGNOSIS — M1000 Idiopathic gout, unspecified site: Secondary | ICD-10-CM

## 2019-02-10 DIAGNOSIS — E785 Hyperlipidemia, unspecified: Secondary | ICD-10-CM

## 2019-02-10 DIAGNOSIS — E039 Hypothyroidism, unspecified: Secondary | ICD-10-CM

## 2019-02-10 NOTE — Telephone Encounter (Signed)
Copied from [place] [phone]. Topic: General - Other >> Feb 10, 2019  2:13 PM Lennox Solders wrote: Reason for CRM: pt is calling and needs new rx [facility] benazepril (LOTENSIN) 40 MG tablet, AMLODIpine, allopurinol, hctz 12.5, rosuvastatin, generic nasacort, and levothyroxine 50 mcg. Please send #90 with refills. [facility] fax number [phone]

## 2019-02-11 MED ORDER — HYDROCHLOROTHIAZIDE 12.5 MG PO TABS: 12.5000 mg | ORAL_TABLET | Freq: Every day | ORAL | 4 refills | Status: DC

## 2019-02-11 MED ORDER — BENAZEPRIL HCL 40 MG PO TABS: 40.0000 mg | ORAL_TABLET | Freq: Every day | ORAL | 4 refills | Status: DC

## 2019-02-11 MED ORDER — ALLOPURINOL 300 MG PO TABS: 300.0000 mg | ORAL_TABLET | Freq: Every day | ORAL | 4 refills | Status: DC

## 2019-02-11 MED ORDER — LEVOTHYROXINE SODIUM 50 MCG PO TABS: 50.0000 ug | ORAL_TABLET | Freq: Every day | ORAL | 4 refills | Status: DC

## 2019-02-11 MED ORDER — TRIAMCINOLONE ACETONIDE 55 MCG/ACT NA AERO: 2.0000 | INHALATION_SPRAY | Freq: Every day | NASAL | 4 refills | Status: DC

## 2019-02-11 MED ORDER — ROSUVASTATIN CALCIUM 40 MG PO TABS: 40.0000 mg | ORAL_TABLET | Freq: Every day | ORAL | 4 refills | Status: DC

## 2019-02-11 MED ORDER — AMLODIPINE BESYLATE 10 MG PO TABS: 10.0000 mg | ORAL_TABLET | Freq: Every day | ORAL | 4 refills | Status: DC

## 2019-03-07 DIAGNOSIS — E782 Mixed hyperlipidemia: Secondary | ICD-10-CM | POA: Diagnosis not present

## 2019-03-07 DIAGNOSIS — Z9861 Coronary angioplasty status: Secondary | ICD-10-CM | POA: Diagnosis not present

## 2019-03-07 DIAGNOSIS — I351 Nonrheumatic aortic (valve) insufficiency: Secondary | ICD-10-CM | POA: Diagnosis not present

## 2019-03-07 DIAGNOSIS — I10 Essential (primary) hypertension: Secondary | ICD-10-CM | POA: Diagnosis not present

## 2019-03-07 DIAGNOSIS — R0602 Shortness of breath: Secondary | ICD-10-CM | POA: Diagnosis not present

## 2019-04-04 ENCOUNTER — Other Ambulatory Visit: Payer: Self-pay

## 2019-04-04 NOTE — Telephone Encounter (Signed)
Patient last seen 11/14/18 and has appointment 05/22/19.

## 2019-04-05 MED ORDER — CLOPIDOGREL BISULFATE 75 MG PO TABS: 75.0000 mg | ORAL_TABLET | Freq: Every day | ORAL | 2 refills | Status: DC

## 2019-05-14 ENCOUNTER — Encounter: Payer: Self-pay | Admitting: Family Medicine

## 2019-05-14 ENCOUNTER — Ambulatory Visit (INDEPENDENT_AMBULATORY_CARE_PROVIDER_SITE_OTHER): Payer: Medicare Other | Admitting: Family Medicine

## 2019-05-14 ENCOUNTER — Other Ambulatory Visit: Payer: Self-pay

## 2019-05-14 DIAGNOSIS — I10 Essential (primary) hypertension: Secondary | ICD-10-CM | POA: Diagnosis not present

## 2019-05-14 DIAGNOSIS — M1000 Idiopathic gout, unspecified site: Secondary | ICD-10-CM

## 2019-05-14 DIAGNOSIS — E785 Hyperlipidemia, unspecified: Secondary | ICD-10-CM

## 2019-05-14 LAB — LP+ALT+AST PICCOLO, WAIVED
ALT (SGPT) Piccolo, Waived: 22 U/L (ref 10–47)
AST (SGOT) Piccolo, Waived: 33 U/L (ref 11–38)
Chol/HDL Ratio Piccolo,Waive: 3.4 mg/dL
Cholesterol Piccolo, Waived: 98 mg/dL
HDL Chol Piccolo, Waived: 29 mg/dL — ABNORMAL LOW
LDL Chol Calc Piccolo Waived: 29 mg/dL
Triglycerides Piccolo,Waived: 205 mg/dL — ABNORMAL HIGH
VLDL Chol Calc Piccolo,Waive: 41 mg/dL — ABNORMAL HIGH

## 2019-05-14 MED ORDER — HYDROCHLOROTHIAZIDE 25 MG PO TABS: 25.0000 mg | ORAL_TABLET | Freq: Every day | ORAL | 3 refills | Status: DC

## 2019-05-14 MED ORDER — CARVEDILOL 25 MG PO TABS: 25.0000 mg | ORAL_TABLET | Freq: Two times a day (BID) | ORAL | 3 refills | Status: DC

## 2019-05-14 NOTE — Assessment & Plan Note (Signed)
The current medical regimen is effective;  continue present plan and medications.  

## 2019-05-14 NOTE — Progress Notes (Addendum)
There were no vitals taken for this visit.   Subjective:    Patient ID: Ricky Melton, male    DOB: [birth year], 68 y.o.   MRN: [id number]  HPI: Ricky Melton is a 68 y.o. male  Med check Patient all in all doing well no complaints blood pressure good control with good readings is trying to lose a few pounds with some success but had gained with COVID-19 restrictions. Cholesterol also doing well. Thyroid no issues takes medications faithfully.  Relevant past medical, surgical, family and social history reviewed and updated as indicated. Interim medical history since our last visit reviewed. Allergies and medications reviewed and updated.  Review of Systems  Constitutional: Negative.   Respiratory: Negative.   Cardiovascular: Negative.     Per HPI unless specifically indicated above     Objective:    There were no vitals taken for this visit.  Wt Readings from Last 3 Encounters:  11/14/18 248 lb 4 oz (112.6 kg)  11/07/18 246 lb 9.6 oz (111.9 kg)  09/16/18 247 lb 9 oz (112.3 kg)    Physical Exam  Results for orders placed or performed in visit on 11/14/18  Microscopic Examination   URINE  Result Value Ref Range   WBC, UA None seen 0 - 5 /hpf   RBC, UA 3-10 (A) 0 - 2 /hpf   Epithelial Cells (non renal) 0-10 0 - 10 /hpf   Bacteria, UA None seen None seen/Few  CBC with Differential/Platelet  Result Value Ref Range   WBC 7.6 3.4 - 10.8 x10E3/uL   RBC 4.89 4.14 - 5.80 x10E6/uL   Hemoglobin 14.3 13.0 - 17.7 g/dL   Hematocrit 41.0 37.5 - 51.0 %   MCV 84 79 - 97 fL   MCH 29.2 26.6 - 33.0 pg   MCHC 34.9 31.5 - 35.7 g/dL   RDW 14.2 11.6 - 15.4 %   Platelets 233 150 - 450 x10E3/uL   Neutrophils 60 Not Estab. %   Lymphs 29 Not Estab. %   Monocytes 7 Not Estab. %   Eos 3 Not Estab. %   Basos 1 Not Estab. %   Neutrophils Absolute 4.6 1.4 - 7.0 x10E3/uL   Lymphocytes Absolute 2.2 0.7 - 3.1 x10E3/uL   Monocytes Absolute 0.5 0.1 - 0.9 x10E3/uL   EOS (ABSOLUTE) 0.2 0.0 -  0.4 x10E3/uL   Basophils Absolute 0.1 0.0 - 0.2 x10E3/uL   Immature Granulocytes 0 Not Estab. %   Immature Grans (Abs) 0.0 0.0 - 0.1 x10E3/uL  Comprehensive metabolic panel  Result Value Ref Range   Glucose 110 (H) 65 - 99 mg/dL   BUN 23 8 - 27 mg/dL   Creatinine, Ser 1.31 (H) 0.76 - 1.27 mg/dL   GFR calc non Af Amer 56 (L) >59 mL/min/1.73   GFR calc Af Amer 64 >59 mL/min/1.73   BUN/Creatinine Ratio 18 10 - 24   Sodium 139 134 - 144 mmol/L   Potassium 4.5 3.5 - 5.2 mmol/L   Chloride 104 96 - 106 mmol/L   CO2 20 20 - 29 mmol/L   Calcium 9.5 8.6 - 10.2 mg/dL   Total Protein 6.9 6.0 - 8.5 g/dL   Albumin 4.4 3.8 - 4.8 g/dL   Globulin, Total 2.5 1.5 - 4.5 g/dL   Albumin/Globulin Ratio 1.8 1.2 - 2.2   Bilirubin Total 0.3 0.0 - 1.2 mg/dL   Alkaline Phosphatase 52 39 - 117 IU/L   AST 25 0 - 40 IU/L   ALT 21  0 - 44 IU/L  Lipid Panel w/o Chol/HDL Ratio  Result Value Ref Range   Cholesterol, Total 107 100 - 199 mg/dL   Triglycerides 93 0 - 149 mg/dL   HDL 35 (L) >39 mg/dL   VLDL Cholesterol Cal 19 5 - 40 mg/dL   LDL Calculated 53 0 - 99 mg/dL  PSA  Result Value Ref Range   Prostate Specific Ag, Serum 1.0 0.0 - 4.0 ng/mL  TSH  Result Value Ref Range   TSH 2.360 0.450 - 4.500 uIU/mL  Urinalysis, Routine w reflex microscopic  Result Value Ref Range   Specific Gravity, UA 1.010 1.005 - 1.030   pH, UA 5.5 5.0 - 7.5   Color, UA Yellow Yellow   Appearance Ur Clear Clear   Leukocytes, UA Negative Negative   Protein, UA 2+ (A) Negative/Trace   Glucose, UA Negative Negative   Ketones, UA Negative Negative   RBC, UA 1+ (A) Negative   Bilirubin, UA Negative Negative   Urobilinogen, Ur 0.2 0.2 - 1.0 mg/dL   Nitrite, UA Negative Negative   Microscopic Examination See below:   Uric acid  Result Value Ref Range   Uric Acid 4.3 3.7 - 8.6 mg/dL      Assessment & Plan:   Problem List Items Addressed This Visit      Cardiovascular and Mediastinum   Hypertension    The current medical  regimen is effective;  continue present plan and medications.       Relevant Medications   hydrochlorothiazide (HYDRODIURIL) 25 MG tablet   carvedilol (COREG) 25 MG tablet   Other Relevant Orders   Basic metabolic panel     Other   Hyperlipidemia    The current medical regimen is effective;  continue present plan and medications.       Relevant Medications   hydrochlorothiazide (HYDRODIURIL) 25 MG tablet   carvedilol (COREG) 25 MG tablet   Other Relevant Orders   LP+ALT+AST [place], Waived   Gout    The current medical regimen is effective;  continue present plan and medications.          Telemedicine using audio/video telecommunications for a synchronous communication visit. Today's visit due to COVID-19 isolation precautions I connected with and verified that I am speaking with the correct person using two identifiers.   I discussed the limitations, risks, security and privacy concerns of performing an evaluation and management service by telecommunication and the availability of in person appointments. I also discussed with the patient that there may be a patient responsible charge related to this service. The patient expressed understanding and agreed to proceed. The patient's location is parking lot. I am at home.   I discussed the assessment and treatment plan with the patient. The patient was provided an opportunity to ask questions and all were answered. The patient agreed with the plan and demonstrated an understanding of the instructions.   The patient was advised to call back or seek an in-person evaluation if the symptoms worsen or if the condition fails to improve as anticipated.   I provided 21+ minutes of time during this encounter. Follow up plan: Return in about 6 months (around 11/11/2019).

## 2019-05-15 LAB — BASIC METABOLIC PANEL
BUN/Creatinine Ratio: 17 (ref 10–24)
BUN: 28 mg/dL — ABNORMAL HIGH (ref 8–27)
CO2: 19 mmol/L — ABNORMAL LOW (ref 20–29)
Calcium: 9.6 mg/dL (ref 8.6–10.2)
Chloride: 103 mmol/L (ref 96–106)
Creatinine, Ser: 1.61 mg/dL — ABNORMAL HIGH (ref 0.76–1.27)
GFR calc Af Amer: 50 mL/min/{1.73_m2} — ABNORMAL LOW (ref >59)
GFR calc non Af Amer: 43 mL/min/{1.73_m2} — ABNORMAL LOW (ref >59)
Glucose: 81 mg/dL (ref 65–99)
Potassium: 5.1 mmol/L (ref 3.5–5.2)
Sodium: 141 mmol/L (ref 134–144)

## 2019-05-21 ENCOUNTER — Ambulatory Visit (INDEPENDENT_AMBULATORY_CARE_PROVIDER_SITE_OTHER): Payer: Medicare Other

## 2019-05-21 ENCOUNTER — Other Ambulatory Visit: Payer: Self-pay

## 2019-05-21 DIAGNOSIS — Z23 Encounter for immunization: Secondary | ICD-10-CM

## 2019-05-22 ENCOUNTER — Ambulatory Visit: Payer: BLUE CROSS/BLUE SHIELD | Admitting: Family Medicine

## 2019-06-02 ENCOUNTER — Other Ambulatory Visit: Payer: Self-pay

## 2019-06-02 NOTE — Patient Outreach (Signed)
[facility]) Care Management  06/02/2019  Ricky Melton [birth year] [id number]   Medication Adherence call to Ricky Melton [place] Identifiers Verify spoke with patient he is past due on Rosuvastatin 40 mg patient explain he takes 1 tablet daily patient received this medication a month ago from [facility] for a 90 days supply patient has plenty at this time. Ricky Melton is showing past due under [place].   [facility] Direct Dial [phone]  Fax [phone] [email or web address]

## 2019-08-14 DIAGNOSIS — I2510 Atherosclerotic heart disease of native coronary artery without angina pectoris: Secondary | ICD-10-CM | POA: Diagnosis not present

## 2019-08-14 DIAGNOSIS — I10 Essential (primary) hypertension: Secondary | ICD-10-CM | POA: Diagnosis not present

## 2019-08-14 DIAGNOSIS — E782 Mixed hyperlipidemia: Secondary | ICD-10-CM | POA: Diagnosis not present

## 2019-08-14 DIAGNOSIS — Z9861 Coronary angioplasty status: Secondary | ICD-10-CM | POA: Diagnosis not present

## 2019-09-03 DIAGNOSIS — H35341 Macular cyst, hole, or pseudohole, right eye: Secondary | ICD-10-CM | POA: Diagnosis not present

## 2019-09-12 HISTORY — PX: EYE SURGERY: SHX253

## 2019-09-19 DIAGNOSIS — R0602 Shortness of breath: Secondary | ICD-10-CM | POA: Diagnosis not present

## 2019-09-19 DIAGNOSIS — E782 Mixed hyperlipidemia: Secondary | ICD-10-CM | POA: Diagnosis not present

## 2019-09-19 DIAGNOSIS — I2510 Atherosclerotic heart disease of native coronary artery without angina pectoris: Secondary | ICD-10-CM | POA: Diagnosis not present

## 2019-09-19 DIAGNOSIS — I10 Essential (primary) hypertension: Secondary | ICD-10-CM | POA: Diagnosis not present

## 2019-10-01 DIAGNOSIS — H35372 Puckering of macula, left eye: Secondary | ICD-10-CM | POA: Diagnosis not present

## 2019-10-01 DIAGNOSIS — H35341 Macular cyst, hole, or pseudohole, right eye: Secondary | ICD-10-CM | POA: Diagnosis not present

## 2019-10-01 DIAGNOSIS — H2513 Age-related nuclear cataract, bilateral: Secondary | ICD-10-CM | POA: Diagnosis not present

## 2019-10-07 DIAGNOSIS — H35341 Macular cyst, hole, or pseudohole, right eye: Secondary | ICD-10-CM | POA: Diagnosis not present

## 2019-10-07 DIAGNOSIS — H2513 Age-related nuclear cataract, bilateral: Secondary | ICD-10-CM | POA: Diagnosis not present

## 2019-10-07 DIAGNOSIS — H25013 Cortical age-related cataract, bilateral: Secondary | ICD-10-CM | POA: Diagnosis not present

## 2019-10-07 DIAGNOSIS — H18413 Arcus senilis, bilateral: Secondary | ICD-10-CM | POA: Diagnosis not present

## 2019-10-07 DIAGNOSIS — H25043 Posterior subcapsular polar age-related cataract, bilateral: Secondary | ICD-10-CM | POA: Diagnosis not present

## 2019-10-07 DIAGNOSIS — H2511 Age-related nuclear cataract, right eye: Secondary | ICD-10-CM | POA: Diagnosis not present

## 2019-10-20 ENCOUNTER — Telehealth: Payer: Self-pay | Admitting: Family Medicine

## 2019-10-20 DIAGNOSIS — H2511 Age-related nuclear cataract, right eye: Secondary | ICD-10-CM | POA: Diagnosis not present

## 2019-10-20 DIAGNOSIS — H35341 Macular cyst, hole, or pseudohole, right eye: Secondary | ICD-10-CM | POA: Diagnosis not present

## 2019-10-20 NOTE — Chronic Care Management (AMB) (Signed)
  Chronic Care Management   Outreach Note  10/20/2019 Name: Ricky Melton MRN: [id number] DOB: Apr 08, 1951  Ricky Melton is a 69 y.o. year old male who is a primary care patient of Crissman, Jeannette How, MD. I reached out to Ricky Melton by phone today in response to a referral sent by Ricky Melton's health plan.     An unsuccessful telephone outreach was attempted today. The patient was referred to the case management team for assistance with care management and care coordination.   Follow Up Plan: A HIPPA compliant phone message was left for the patient providing contact information and requesting a return call.  The care management team will reach out to the patient again over the next 7 days.  If patient returns call to provider office, please advise to call [facility] at [place], [place], [place], [place] [address] Direct Dial: [phone] [email or web address] Website: [email or web address]

## 2019-10-21 DIAGNOSIS — H35341 Macular cyst, hole, or pseudohole, right eye: Secondary | ICD-10-CM | POA: Diagnosis not present

## 2019-10-22 NOTE — Chronic Care Management (AMB) (Signed)
Chronic Care Management   Note  10/22/2019 Name: BALDEMAR DADY MRN: [id number] DOB: 22-Dec-1950  WILDON CUEVAS is a 69 y.o. year old male who is a primary care patient of Crissman, Jeannette How, MD. I reached out to Curt Bears by phone today in response to a referral sent by Mr. Manon Hilding Farnworth's health plan.     Mr. Sookram was given information about Chronic Care Management services today including:  1. CCM service includes personalized support from designated clinical staff supervised by his physician, including individualized plan of care and coordination with other care providers 2. 24/7 contact phone numbers for assistance for urgent and routine care needs. 3. Service will only be billed when office clinical staff spend 20 minutes or more in a month to coordinate care. 4. Only one practitioner may furnish and bill the service in a calendar month. 5. The patient may stop CCM services at any time (effective at the end of the month) by phone call to the office staff. 6. The patient will be responsible for cost sharing (co-pay) of up to 20% of the service fee (after annual deductible is met).  Patient agreed to services and verbal consent obtained.   Follow up plan: Telephone appointment with care management team member scheduled for:12/12/2019  Noreene Larsson, [place], [place], [place] [address] Direct Dial: [phone] [email or web address] Website: [email or web address]

## 2019-10-28 DIAGNOSIS — H35372 Puckering of macula, left eye: Secondary | ICD-10-CM | POA: Diagnosis not present

## 2019-10-28 DIAGNOSIS — H35341 Macular cyst, hole, or pseudohole, right eye: Secondary | ICD-10-CM | POA: Diagnosis not present

## 2019-11-10 ENCOUNTER — Ambulatory Visit: Payer: Medicare Other

## 2019-11-14 ENCOUNTER — Other Ambulatory Visit: Payer: Self-pay

## 2019-11-14 ENCOUNTER — Encounter: Payer: Self-pay | Admitting: Family Medicine

## 2019-11-14 NOTE — Telephone Encounter (Signed)
Refill request for Plavix LOV: 05/14/2019 Next Appt: 11/24/2019 with Apolonio Schneiders

## 2019-11-16 MED ORDER — CLOPIDOGREL BISULFATE 75 MG PO TABS: 75.0000 mg | ORAL_TABLET | Freq: Every day | ORAL | 1 refills | Status: DC

## 2019-11-18 DIAGNOSIS — H35341 Macular cyst, hole, or pseudohole, right eye: Secondary | ICD-10-CM | POA: Diagnosis not present

## 2019-11-19 ENCOUNTER — Other Ambulatory Visit: Payer: Self-pay

## 2019-11-19 ENCOUNTER — Ambulatory Visit (INDEPENDENT_AMBULATORY_CARE_PROVIDER_SITE_OTHER): Payer: Medicare Other

## 2019-11-19 VITALS — BP 118/66 | HR 70 | Temp 98.4°F | Ht 66.5 in | Wt 249.6 lb

## 2019-11-19 DIAGNOSIS — Z0000 Encounter for general adult medical examination without abnormal findings: Secondary | ICD-10-CM | POA: Diagnosis not present

## 2019-11-19 NOTE — Progress Notes (Signed)
Subjective:   Ricky Melton is a 69 y.o. male who presents for Medicare Annual/Subsequent preventive examination.  This visit is being conducted via phone call  - after an attmept to do on video chat - due to the COVID-19 pandemic. This patient has given me verbal consent via phone to conduct this visit, patient states they are participating from their home address. Some vital signs may be absent or patient reported.   Patient identification: identified by name, DOB, and current address.    Review of Systems:   Cardiac Risk Factors include: advanced age (>48men, >61 women);male gender;dyslipidemia;hypertension     Objective:    Vitals: BP 118/66 (BP Location: Left Arm, Patient Position: Sitting)   Pulse 70   Temp 98.4 F (36.9 C) (Temporal)   Ht 5' 6.5" (1.689 m)   Wt 249 lb 9.6 oz (113.2 kg)   BMI 39.68 kg/m   Body mass index is 39.68 kg/m.  Advanced Directives 11/19/2019 11/07/2018 05/10/2018 10/25/2017  Does Patient Have a Medical Advance Directive? No No No No  Would patient like information on creating a medical advance directive? - Yes (MAU/Ambulatory/Procedural Areas - Information given) - Yes (MAU/Ambulatory/Procedural Areas - Information given)    Tobacco Social History   Tobacco Use  Smoking Status Never Smoker  Smokeless Tobacco Former Systems developer  . Types: Chew     Counseling given: Not Answered   Clinical Intake:  Pre-visit preparation completed: Yes  Pain : No/denies pain     Nutritional Status: BMI > 30  Obese Nutritional Risks: None Diabetes: No  How often do you need to have someone help you when you read instructions, pamphlets, or other written materials from your doctor or pharmacy?: 1 - Never  Interpreter Needed?: No  Information entered by :: Denaja Verhoeven, LPN  Past Medical History:  Diagnosis Date  . Benign hematuria   . CAD (coronary artery disease)   . Chronic kidney disease   . Hyperlipidemia   . Hypertension    Past Surgical  History:  Procedure Laterality Date  . cardiac stents     x2  . COLONOSCOPY WITH PROPOFOL N/A 05/10/2018   Procedure: COLONOSCOPY WITH PROPOFOL;  Surgeon: Jonathon Bellows, MD;  Location[facility] ENDOSCOPY;  Service: Gastroenterology;  Laterality: N/A;  . EYE SURGERY Right 2021   cataract sx in [place]    Family History  Problem Relation Age of Onset  . Hypertension Mother   . Diabetes Sister   . Diabetes Son    Social History   Socioeconomic History  . Marital status: Married    Spouse name: Not on file  . Number of children: Not on file  . Years of education: Not on file  . Highest education level: High school graduate  Occupational History  . Occupation: maintenence     Comment: part time   Tobacco Use  . Smoking status: Never Smoker  . Smokeless tobacco: Former Systems developer    Types: Chew  Substance and Sexual Activity  . Alcohol use: No    Alcohol/week: 0.0 standard drinks  . Drug use: No  . Sexual activity: Yes  Other Topics Concern  . Not on file  Social History Narrative   Plays golf with friends, mows yards    Social Determinants of Health   Financial Resource Strain:   . Difficulty of Paying Living Expenses: Not on file  Food Insecurity:   . Worried About Charity fundraiser in the Last Year: Not on file  . Ran  Out of Food in the Last Year: Not on file  Transportation Needs:   . Lack of Transportation (Medical): Not on file  . Lack of Transportation (Non-Medical): Not on file  Physical Activity:   . Days of Exercise per Week: Not on file  . Minutes of Exercise per Session: Not on file  Stress:   . Feeling of Stress : Not on file  Social Connections:   . Frequency of Communication with Friends and Family: Not on file  . Frequency of Social Gatherings with Friends and Family: Not on file  . Attends Religious Services: Not on file  . Active Member of Clubs or Organizations: Not on file  . Attends Archivist Meetings: Not on file  . Marital Status: Not  on file    Outpatient Encounter Medications as of 11/19/2019  Medication Sig  . allopurinol (ZYLOPRIM) 300 MG tablet Take 1 tablet (300 mg total) by mouth daily.  Marland Kitchen amLODipine (NORVASC) 10 MG tablet Take 1 tablet (10 mg total) by mouth daily.  Marland Kitchen aspirin 81 MG tablet Take 81 mg by mouth daily.  . benazepril (LOTENSIN) 40 MG tablet Take 1 tablet (40 mg total) by mouth daily.  . carvedilol (COREG) 25 MG tablet Take 1 tablet (25 mg total) by mouth 2 (two) times daily with a meal.  . chlorthalidone (HYGROTON) 25 MG tablet Take 25 mg by mouth daily.  . clopidogrel (PLAVIX) 75 MG tablet Take 1 tablet (75 mg total) by mouth daily.  Marland Kitchen levothyroxine (SYNTHROID) 50 MCG tablet Take 1 tablet (50 mcg total) by mouth daily.  . naproxen sodium (ALEVE) 220 MG tablet Take 220 mg by mouth.  . rosuvastatin (CRESTOR) 40 MG tablet Take 1 tablet (40 mg total) by mouth daily.  . [DISCONTINUED] guaiFENesin (MUCINEX) 600 MG 12 hr tablet Take 1 tablet (600 mg total) by mouth 2 (two) times daily as needed. (Patient not taking: Reported on 11/07/2018)  . [DISCONTINUED] hydrochlorothiazide (HYDRODIURIL) 12.5 MG tablet Take 1 tablet (12.5 mg total) by mouth daily. (Patient not taking: Reported on 11/19/2019)  . [DISCONTINUED] hydrochlorothiazide (HYDRODIURIL) 25 MG tablet Take 1 tablet (25 mg total) by mouth daily. (Patient not taking: Reported on 11/19/2019)  . [DISCONTINUED] triamcinolone (NASACORT) 55 MCG/ACT AERO nasal inhaler Place 2 sprays into the nose daily. (Patient not taking: Reported on 11/19/2019)   No facility-administered encounter medications on file as of 11/19/2019.    Activities of Daily Living In your present state of health, do you have any difficulty performing the following activities: 11/19/2019  Hearing? N  Comment no hearing aids  Vision? N  Comment eyeglasses, [place] for eye dr  Difficulty concentrating or making decisions? N  Walking or climbing stairs? N  Dressing or bathing? N  Doing  errands, shopping? N  Preparing Food and eating ? N  Using the Toilet? N  In the past six months, have you accidently leaked urine? N  Do you have problems with loss of bowel control? N  Managing your Medications? N  Managing your Finances? N  Housekeeping or managing your Housekeeping? N  Some recent data might be hidden    Patient Care Team: Guadalupe Maple, MD as PCP - General (Family Medicine) Dionisio David, MD as Consulting Physician (Cardiology) Vanita Ingles, RN as Registered Nurse ([place])   Assessment:   This is a routine wellness examination for [facility].  Exercise Activities and Dietary recommendations Current Exercise Habits: Home exercise routine, Type of exercise: walking, Time (Minutes):  30(2-3 miles a day between work), Frequency (Times/Week): 7, Weekly Exercise (Minutes/Week): 210, Intensity: Mild, Exercise limited by: None identified  Goals Addressed   None     Fall Risk: Fall Risk  11/19/2019 11/14/2018 11/07/2018 04/17/2018 10/25/2017  Falls in the past year? 0 0 0 No No  Number falls in past yr: 0 0 - - -  Injury with Fall? 0 0 - - -    FALL RISK PREVENTION PERTAINING TO THE HOME:  Any stairs in or around the home? Yes  If so, are there any without handrails? No   Home free of loose throw rugs in walkways, pet beds, electrical cords, etc? Yes  Adequate lighting in your home to reduce risk of falls? Yes   ASSISTIVE DEVICES UTILIZED TO PREVENT FALLS:  Life alert? No  Use of a cane, walker or w/c? No  Grab bars in the bathroom? Yes  Shower chair or bench in shower? No  Elevated toilet seat or a handicapped toilet? Yes elevated   TIMED UP AND GO:  Was the test performed? Yes .  Length of time to ambulate 10 feet: 8 sec.   GAIT:  Appearance of gait: Gait steady and fast without the use of an assistive device.  Education: Fall risk prevention has been discussed.  Intervention(s) required? No  DME/home health order needed?  No    Depression Screen PHQ 2/9 Scores 11/19/2019 11/14/2018 11/07/2018 04/17/2018  PHQ - 2 Score 0 0 0 0  PHQ- 9 Score - - - -    Cognitive Function     6CIT Screen 11/19/2019 11/07/2018 11/07/2018 10/25/2017  What Year? 0 points 0 points 0 points 0 points  What month? 0 points 0 points 0 points 0 points  What time? 0 points 0 points 0 points 0 points  Count back from 20 0 points 0 points 0 points 0 points  Months in reverse 0 points 0 points 0 points 0 points  Repeat phrase 0 points 2 points 0 points 0 points  Total Score 0 2 0 0    Immunization History  Administered Date(s) Administered  . Fluad Quad(high Dose 65+) 05/21/2019  . Influenza, High Dose Seasonal PF 06/27/2018  . Influenza,inj,Quad PF,6+ Mos 10/14/2015  . Moderna SARS-COVID-2 Vaccination 11/08/2019  . Pneumococcal Conjugate-13 04/13/2016  . Pneumococcal Polysaccharide-23 06/07/2006, 10/25/2017  . Td 01/23/2006  . Tdap 04/13/2016  . Zoster 08/21/2011    Qualifies for Shingles Vaccine? Yes  Zostavax completed 2012. Due for Shingrix. Education has been provided regarding the importance of this vaccine. Pt has been advised to call insurance company to determine out of pocket expense. Advised may also receive vaccine at local pharmacy or Health Dept. Verbalized acceptance and understanding.  Tdap: up to date   Flu Vaccine: up to date   Pneumococcal Vaccine: up to date    Covid-19 Vaccine: completed 11/08/2019  Screening Tests Health Maintenance  Topic Date Due  . TETANUS/TDAP  04/13/2026  . COLONOSCOPY  05/10/2028  . INFLUENZA VACCINE  Completed  . Hepatitis C Screening  Completed  . PNA vac Low Risk Adult  Completed   Cancer Screenings:  Colorectal Screening: Completed 05/10/2018. Repeat every 10 years  Lung Cancer Screening: (Low Dose CT Chest recommended if Age 28-80 years, 30 pack-year currently smoking OR have quit w/in 15years.) does not qualify.   Additional Screening:  Hepatitis C Screening: does  qualify; Completed 2017  Vision Screening: Recommended annual ophthalmology exams for early detection of glaucoma and other disorders of  the eye. Is the patient up to date with their annual eye exam?  Yes  Who is the provider or what is the name of the office in which the pt attends annual eye exams? [facility]    Dental Screening: Recommended annual dental exams for proper oral hygiene  Community Resource Referral:  CRR required this visit?  No        Plan:  I have personally reviewed and addressed the Medicare Annual Wellness questionnaire and have noted the following in the patient's chart:  A. Medical and social history B. Use of alcohol, tobacco or illicit drugs  C. Current medications and supplements D. Functional ability and status E.  Nutritional status F.  Physical activity G. Advance directives H. List of other physicians I.  Hospitalizations, surgeries, and ER visits in previous 12 months J.  [place] such as hearing and vision if needed, cognitive and depression L. Referrals and appointments   In addition, I have reviewed and discussed with patient certain preventive protocols, quality metrics, and best practice recommendations. A written personalized care plan for preventive services as well as general preventive health recommendations were provided to patient.   Signed,   Bevelyn Ngo, LPN  [phone] Nurse Health Advisor   Nurse Notes: none

## 2019-11-19 NOTE — Patient Instructions (Signed)
[name] , Thank you for taking time to come for your Medicare Wellness Visit. I appreciate your ongoing commitment to your health goals. Please review the following plan we discussed and let me know if I can assist you in the future.   Screening recommendations/referrals: Colonoscopy: completed 2019 Recommended yearly ophthalmology/optometry visit for glaucoma screening and checkup Recommended yearly dental visit for hygiene and checkup  Vaccinations: Influenza vaccine: up to date  Pneumococcal vaccine: up to date  Tdap vaccine: up to date  Shingles vaccine: shingrix eligible    Covid-19: completed first dose, scheduled second dose  Advanced directives: Advance directive discussed with you today. I have provided a copy for you to complete at home and have notarized. Once this is complete please bring a copy in to our office so we can scan it into your chart.  Conditions/risks identified: none   Next appointment: Follow up in one year for your annual wellness visit.   Preventive Care 69 Years and Older, Male Preventive care refers to lifestyle choices and visits with your health care provider that can promote health and wellness. What does preventive care include?  A yearly physical exam. This is also called an annual well check.  Dental exams once or twice a year.  Routine eye exams. Ask your health care provider how often you should have your eyes checked.  Personal lifestyle choices, including:  Daily care of your teeth and gums.  Regular physical activity.  Eating a healthy diet.  Avoiding tobacco and drug use.  Limiting alcohol use.  Practicing safe sex.  Taking low doses of aspirin every day.  Taking vitamin and mineral supplements as recommended by your health care provider. What happens during an annual well check? The services and screenings done by your health care provider during your annual well check will depend on your age, overall health, lifestyle  risk factors, and family history of disease. Counseling  Your health care provider may ask you questions about your:  Alcohol use.  Tobacco use.  Drug use.  Emotional well-being.  Home and relationship well-being.  Sexual activity.  Eating habits.  History of falls.  Memory and ability to understand (cognition).  Work and work Statistician. Screening  You may have the following tests or measurements:  Height, weight, and BMI.  Blood pressure.  Lipid and cholesterol levels. These may be checked every 5 years, or more frequently if you are over 69 years old.  Skin check.  Lung cancer screening. You may have this screening every year starting at age 69 if you have a 30-pack-year history of smoking and currently smoke or have quit within the past 15 years.  Fecal occult blood test (FOBT) of the stool. You may have this test every year starting at age 69.  Flexible sigmoidoscopy or colonoscopy. You may have a sigmoidoscopy every 5 years or a colonoscopy every 10 years starting at age 69.  Prostate cancer screening. Recommendations will vary depending on your family history and other risks.  Hepatitis C blood test.  Hepatitis B blood test.  Sexually transmitted disease (STD) testing.  Diabetes screening. This is done by checking your blood sugar (glucose) after you have not eaten for a while (fasting). You may have this done every 1-3 years.  Abdominal aortic aneurysm (AAA) screening. You may need this if you are a current or former smoker.  Osteoporosis. You may be screened starting at age 69 if you are at high risk. Talk with your health care provider about  your test results, treatment options, and if necessary, the need for more tests. Vaccines  Your health care provider may recommend certain vaccines, such as:  Influenza vaccine. This is recommended every year.  Tetanus, diphtheria, and acellular pertussis (Tdap, Td) vaccine. You may need a Td booster every 10  years.  Zoster vaccine. You may need this after age 46.  Pneumococcal 13-valent conjugate (PCV13) vaccine. One dose is recommended after age 69.  Pneumococcal polysaccharide (PPSV23) vaccine. One dose is recommended after age 69. Talk to your health care provider about which screenings and vaccines you need and how often you need them. This information is not intended to replace advice given to you by your health care provider. Make sure you discuss any questions you have with your health care provider. Document Released: 09/24/2015 Document Revised: 05/17/2016 Document Reviewed: 06/29/2015 Elsevier Interactive Patient Education  2017 [place] Prevention in the Home Falls can cause injuries. They can happen to people of all ages. There are many things you can do to make your home safe and to help prevent falls. What can I do on the outside of my home?  Regularly fix the edges of walkways and driveways and fix any cracks.  Remove anything that might make you trip as you walk through a door, such as a raised step or threshold.  Trim any bushes or trees on the path to your home.  Use bright outdoor lighting.  Clear any walking paths of anything that might make someone trip, such as rocks or tools.  Regularly check to see if handrails are loose or broken. Make sure that both sides of any steps have handrails.  Any raised decks and porches should have guardrails on the edges.  Have any leaves, snow, or ice cleared regularly.  Use sand or salt on walking paths during winter.  Clean up any spills in your garage right away. This includes oil or grease spills. What can I do in the bathroom?  Use night lights.  Install grab bars by the toilet and in the tub and shower. Do not use towel bars as grab bars.  Use non-skid mats or decals in the tub or shower.  If you need to sit down in the shower, use a plastic, non-slip stool.  Keep the floor dry. Clean up any water that  spills on the floor as soon as it happens.  Remove soap buildup in the tub or shower regularly.  Attach bath mats securely with double-sided non-slip rug tape.  Do not have throw rugs and other things on the floor that can make you trip. What can I do in the bedroom?  Use night lights.  Make sure that you have a light by your bed that is easy to reach.  Do not use any sheets or blankets that are too big for your bed. They should not hang down onto the floor.  Have a firm chair that has side arms. You can use this for support while you get dressed.  Do not have throw rugs and other things on the floor that can make you trip. What can I do in the kitchen?  Clean up any spills right away.  Avoid walking on wet floors.  Keep items that you use a lot in easy-to-reach places.  If you need to reach something above you, use a strong step stool that has a grab bar.  Keep electrical cords out of the way.  Do not use floor polish or wax  that makes floors slippery. If you must use wax, use non-skid floor wax.  Do not have throw rugs and other things on the floor that can make you trip. What can I do with my stairs?  Do not leave any items on the stairs.  Make sure that there are handrails on both sides of the stairs and use them. Fix handrails that are broken or loose. Make sure that handrails are as long as the stairways.  Check any carpeting to make sure that it is firmly attached to the stairs. Fix any carpet that is loose or worn.  Avoid having throw rugs at the top or bottom of the stairs. If you do have throw rugs, attach them to the floor with carpet tape.  Make sure that you have a light switch at the top of the stairs and the bottom of the stairs. If you do not have them, ask someone to add them for you. What else can I do to help prevent falls?  Wear shoes that:  Do not have high heels.  Have rubber bottoms.  Are comfortable and fit you well.  Are closed at the  toe. Do not wear sandals.  If you use a stepladder:  Make sure that it is fully opened. Do not climb a closed stepladder.  Make sure that both sides of the stepladder are locked into place.  Ask someone to hold it for you, if possible.  Clearly mark and make sure that you can see:  Any grab bars or handrails.  First and last steps.  Where the edge of each step is.  Use tools that help you move around (mobility aids) if they are needed. These include:  Canes.  Walkers.  Scooters.  Crutches.  Turn on the lights when you go into a dark area. Replace any light bulbs as soon as they burn out.  Set up your furniture so you have a clear path. Avoid moving your furniture around.  If any of your floors are uneven, fix them.  If there are any pets around you, be aware of where they are.  Review your medicines with your doctor. Some medicines can make you feel dizzy. This can increase your chance of falling. Ask your doctor what other things that you can do to help prevent falls. This information is not intended to replace advice given to you by your health care provider. Make sure you discuss any questions you have with your health care provider. Document Released: 06/24/2009 Document Revised: 02/03/2016 Document Reviewed: 10/02/2014 Elsevier Interactive Patient Education  2017 [employer].

## 2019-11-24 ENCOUNTER — Encounter: Payer: Self-pay | Admitting: Family Medicine

## 2019-12-11 ENCOUNTER — Ambulatory Visit (INDEPENDENT_AMBULATORY_CARE_PROVIDER_SITE_OTHER): Payer: Medicare Other | Admitting: Family Medicine

## 2019-12-11 ENCOUNTER — Other Ambulatory Visit: Payer: Self-pay

## 2019-12-11 ENCOUNTER — Encounter: Payer: Self-pay | Admitting: Family Medicine

## 2019-12-11 VITALS — BP 138/78 | HR 60 | Temp 98.2°F | Ht 66.5 in | Wt 249.0 lb

## 2019-12-11 DIAGNOSIS — M1000 Idiopathic gout, unspecified site: Secondary | ICD-10-CM

## 2019-12-11 DIAGNOSIS — I10 Essential (primary) hypertension: Secondary | ICD-10-CM | POA: Diagnosis not present

## 2019-12-11 DIAGNOSIS — E039 Hypothyroidism, unspecified: Secondary | ICD-10-CM | POA: Diagnosis not present

## 2019-12-11 DIAGNOSIS — E785 Hyperlipidemia, unspecified: Secondary | ICD-10-CM | POA: Diagnosis not present

## 2019-12-11 DIAGNOSIS — N400 Enlarged prostate without lower urinary tract symptoms: Secondary | ICD-10-CM

## 2019-12-11 DIAGNOSIS — I2510 Atherosclerotic heart disease of native coronary artery without angina pectoris: Secondary | ICD-10-CM | POA: Diagnosis not present

## 2019-12-11 DIAGNOSIS — Z0000 Encounter for general adult medical examination without abnormal findings: Secondary | ICD-10-CM | POA: Diagnosis not present

## 2019-12-11 DIAGNOSIS — I2583 Coronary atherosclerosis due to lipid rich plaque: Secondary | ICD-10-CM

## 2019-12-11 LAB — UA/M W/RFLX CULTURE, ROUTINE
Bilirubin, UA: NEGATIVE
Glucose, UA: NEGATIVE
Ketones, UA: NEGATIVE
Leukocytes,UA: NEGATIVE
Nitrite, UA: NEGATIVE
Specific Gravity, UA: 1.015 (ref 1.005–1.030)
Urobilinogen, Ur: 0.2 mg/dL (ref 0.2–1.0)
pH, UA: 5.5 (ref 5.0–7.5)

## 2019-12-11 LAB — MICROSCOPIC EXAMINATION
Bacteria, UA: NONE SEEN
WBC, UA: NONE SEEN /hpf (ref 0–5)

## 2019-12-11 MED ORDER — LEVOTHYROXINE SODIUM 50 MCG PO TABS: 50.0000 ug | ORAL_TABLET | Freq: Every day | ORAL | 1 refills | Status: DC

## 2019-12-11 MED ORDER — ALLOPURINOL 300 MG PO TABS: 300.0000 mg | ORAL_TABLET | Freq: Every day | ORAL | 1 refills | Status: DC

## 2019-12-11 MED ORDER — ROSUVASTATIN CALCIUM 40 MG PO TABS: 40.0000 mg | ORAL_TABLET | Freq: Every day | ORAL | 1 refills | Status: DC

## 2019-12-11 MED ORDER — CARVEDILOL 25 MG PO TABS: 25.0000 mg | ORAL_TABLET | Freq: Two times a day (BID) | ORAL | 1 refills | Status: DC

## 2019-12-11 MED ORDER — AMLODIPINE BESYLATE 10 MG PO TABS: 10.0000 mg | ORAL_TABLET | Freq: Every day | ORAL | 1 refills | Status: DC

## 2019-12-11 MED ORDER — BENAZEPRIL HCL 40 MG PO TABS: 40.0000 mg | ORAL_TABLET | Freq: Every day | ORAL | 1 refills | Status: DC

## 2019-12-11 MED ORDER — CHLORTHALIDONE 25 MG PO TABS: 12.5000 mg | ORAL_TABLET | Freq: Every day | ORAL | 1 refills | Status: DC

## 2019-12-11 NOTE — Progress Notes (Signed)
BP 138/78   Pulse 60   Temp 98.2 F (36.8 C) (Oral)   Ht 5' 6.5" (1.689 m)   Wt 249 lb (112.9 kg)   SpO2 97%   BMI 39.59 kg/m    Subjective:    Patient ID: Ricky Melton, male    DOB: 1950/11/06, 69 y.o.   MRN: [id number]  HPI: Ricky Melton is a 69 y.o. male presenting on 12/11/2019 for comprehensive medical examination. Current medical complaints include:see below  HTN - has not been checking home BPs but takes medication faithfully without side effects. Denies CP, SOB, HAs, dizziness. Does not exercise or follow strict diet.   HLD, CAD - on crestor, aspirin and plavix, tolerating well. Denies claudication, myalgias, CP, SOB.   Hypothyroidism - stable on synthroid  History of gout - on allopurinol, no recent flares.   He currently lives with: Interim Problems from his last visit: no  Depression Screen done today and results listed below:  Depression screen [facility] 2/9 12/11/2019 11/19/2019 11/14/2018 11/07/2018 04/17/2018  Decreased Interest 0 0 0 0 0  Down, Depressed, Hopeless 0 0 0 0 0  PHQ - 2 Score 0 0 0 0 0  Altered sleeping 0 - - - -  Tired, decreased energy 0 - - - -  Change in appetite 0 - - - -  Feeling bad or failure about yourself  0 - - - -  Trouble concentrating 0 - - - -  Moving slowly or fidgety/restless 0 - - - -  Suicidal thoughts 0 - - - -  PHQ-9 Score 0 - - - -  Difficult doing work/chores - - - - -    The patient does not have a history of falls. I did complete a risk assessment for falls. A plan of care for falls was documented.   Past Medical History:  Past Medical History:  Diagnosis Date  . Benign hematuria   . CAD (coronary artery disease)   . Chronic kidney disease   . Hyperlipidemia   . Hypertension     Surgical History:  Past Surgical History:  Procedure Laterality Date  . cardiac stents     x2  . COLONOSCOPY WITH PROPOFOL N/A 05/10/2018   Procedure: COLONOSCOPY WITH PROPOFOL;  Surgeon: Jonathon Bellows, MD;  Location: [facility] ENDOSCOPY;   Service: Gastroenterology;  Laterality: N/A;  . EYE SURGERY Right 2021   cataract sx in      Medications:  Current Outpatient Medications on File Prior to Visit  Medication Sig  . aspirin 81 MG tablet Take 81 mg by mouth daily.  . clopidogrel (PLAVIX) 75 MG tablet Take 1 tablet (75 mg total) by mouth daily.   No current facility-administered medications on file prior to visit.    Allergies:  No Known Allergies  Social History:  Social History   Socioeconomic History  . Marital status: Married    Spouse name: Not on file  . Number of children: Not on file  . Years of education: Not on file  . Highest education level: High school graduate  Occupational History  . Occupation: maintenence     Comment: part time   Tobacco Use  . Smoking status: Never Smoker  . Smokeless tobacco: Former Systems developer    Types: Chew  Substance and Sexual Activity  . Alcohol use: No    Alcohol/week: 0.0 standard drinks  . Drug use: No  . Sexual activity: Yes  Other Topics Concern  . Not on file  Social  History Narrative   Plays golf with friends, mows yards    Social Determinants of Health   Financial Resource Strain:   . Difficulty of Paying Living Expenses:   Food Insecurity:   . Worried About Charity fundraiser in the Last Year:   . Arboriculturist in the Last Year:   Transportation Needs:   . Film/video editor (Medical):   Marland Kitchen Lack of Transportation (Non-Medical):   Physical Activity:   . Days of Exercise per Week:   . Minutes of Exercise per Session:   Stress:   . Feeling of Stress :   Social Connections:   . Frequency of Communication with Friends and Family:   . Frequency of Social Gatherings with Friends and Family:   . Attends Religious Services:   . Active Member of Clubs or Organizations:   . Attends Archivist Meetings:   Marland Kitchen Marital Status:   Intimate Partner Violence:   . Fear of Current or Ex-Partner:   . Emotionally Abused:   Marland Kitchen Physically  Abused:   . Sexually Abused:    Social History   Tobacco Use  Smoking Status Never Smoker  Smokeless Tobacco Former Systems developer  . Types: Chew   Social History   Substance and Sexual Activity  Alcohol Use No  . Alcohol/week: 0.0 standard drinks    Family History:  Family History  Problem Relation Age of Onset  . Hypertension Mother   . Diabetes Sister   . Diabetes Son     Past medical history, surgical history, medications, allergies, family history and social history reviewed with patient today and changes made to appropriate areas of the chart.   Review of Systems - General ROS: negative Psychological ROS: negative Ophthalmic ROS: negative ENT ROS: negative Allergy and Immunology ROS: negative Hematological and Lymphatic ROS: negative Endocrine ROS: negative Breast ROS: negative for breast lumps Respiratory ROS: no cough, shortness of breath, or wheezing Cardiovascular ROS: no chest pain or dyspnea on exertion Gastrointestinal ROS: no abdominal pain, change in bowel habits, or black or bloody stools Genito-Urinary ROS: no dysuria, trouble voiding, or hematuria Musculoskeletal ROS: negative Neurological ROS: no TIA or stroke symptoms Dermatological ROS: negative All other ROS negative except what is listed above and in the HPI.      Objective:    BP 138/78   Pulse 60   Temp 98.2 F (36.8 C) (Oral)   Ht 5' 6.5" (1.689 m)   Wt 249 lb (112.9 kg)   SpO2 97%   BMI 39.59 kg/m   Wt Readings from Last 3 Encounters:  12/11/19 249 lb (112.9 kg)  11/19/19 249 lb 9.6 oz (113.2 kg)  11/14/18 248 lb 4 oz (112.6 kg)    Physical Exam Vitals and nursing note reviewed.  Constitutional:      General: He is not in acute distress.    Appearance: He is well-developed.  HENT:     Head: Atraumatic.     Right Ear: Tympanic membrane and external ear normal.     Left Ear: Tympanic membrane and external ear normal.     Nose: Nose normal.     Mouth/Throat:     Mouth: Mucous  membranes are moist.     Pharynx: Oropharynx is clear.  Eyes:     General: No scleral icterus.    Conjunctiva/sclera: Conjunctivae normal.     Pupils: Pupils are equal, round, and reactive to light.  Cardiovascular:     Rate and Rhythm: Normal rate  and regular rhythm.     Heart sounds: Normal heart sounds. No murmur.  Pulmonary:     Effort: Pulmonary effort is normal. No respiratory distress.     Breath sounds: Normal breath sounds.  Abdominal:     General: Bowel sounds are normal. There is no distension.     Palpations: Abdomen is soft. There is no mass.     Tenderness: There is no abdominal tenderness. There is no guarding.  Genitourinary:    Comments: Declines GU exam today Musculoskeletal:        General: No tenderness. Normal range of motion.     Cervical back: Normal range of motion and neck supple.  Skin:    General: Skin is warm and dry.     Findings: No rash.  Neurological:     General: No focal deficit present.     Mental Status: He is alert and oriented to person, place, and time.     Deep Tendon Reflexes: Reflexes are normal and symmetric.  Psychiatric:        Mood and Affect: Mood normal.        Behavior: Behavior normal.        Thought Content: Thought content normal.        Judgment: Judgment normal.     Results for orders placed or performed in visit on 12/11/19  Microscopic Examination   URINE  Result Value Ref Range   WBC, UA None seen 0 - 5 /hpf   RBC 0-2 0 - 2 /hpf   Epithelial Cells (non renal) 0-10 0 - 10 /hpf   Bacteria, UA None seen None seen/Few  CBC with Differential/Platelet  Result Value Ref Range   WBC 6.6 3.4 - 10.8 x10E3/uL   RBC 4.68 4.14 - 5.80 x10E6/uL   Hemoglobin 13.8 13.0 - 17.7 g/dL   Hematocrit 41.3 37.5 - 51.0 %   MCV 88 79 - 97 fL   MCH 29.5 26.6 - 33.0 pg   MCHC 33.4 31.5 - 35.7 g/dL   RDW 15.5 (H) 11.6 - 15.4 %   Platelets 252 150 - 450 x10E3/uL   Neutrophils 54 Not Estab. %   Lymphs 31 Not Estab. %   Monocytes 9 Not  Estab. %   Eos 5 Not Estab. %   Basos 1 Not Estab. %   Neutrophils Absolute 3.6 1.4 - 7.0 x10E3/uL   Lymphocytes Absolute 2.1 0.7 - 3.1 x10E3/uL   Monocytes Absolute 0.6 0.1 - 0.9 x10E3/uL   EOS (ABSOLUTE) 0.3 0.0 - 0.4 x10E3/uL   Basophils Absolute 0.1 0.0 - 0.2 x10E3/uL   Immature Granulocytes 0 Not Estab. %   Immature Grans (Abs) 0.0 0.0 - 0.1 x10E3/uL  Comprehensive metabolic panel  Result Value Ref Range   Glucose 105 (H) 65 - 99 mg/dL   BUN 25 8 - 27 mg/dL   Creatinine, Ser 1.55 (H) 0.76 - 1.27 mg/dL   GFR calc non Af Amer 45 (L) >59 mL/min/1.73   GFR calc Af Amer 52 (L) >59 mL/min/1.73   BUN/Creatinine Ratio 16 10 - 24   Sodium 140 134 - 144 mmol/L   Potassium 4.8 3.5 - 5.2 mmol/L   Chloride 104 96 - 106 mmol/L   CO2 22 20 - 29 mmol/L   Calcium 9.4 8.6 - 10.2 mg/dL   Total Protein 7.0 6.0 - 8.5 g/dL   Albumin 4.4 3.8 - 4.8 g/dL   Globulin, Total 2.6 1.5 - 4.5 g/dL   Albumin/Globulin Ratio 1.7 1.2 -  2.2   Bilirubin Total 0.4 0.0 - 1.2 mg/dL   Alkaline Phosphatase 57 39 - 117 IU/L   AST 22 0 - 40 IU/L   ALT 16 0 - 44 IU/L  Lipid Panel w/o Chol/HDL Ratio  Result Value Ref Range   Cholesterol, Total 108 100 - 199 mg/dL   Triglycerides 112 0 - 149 mg/dL   HDL 31 (L) >39 mg/dL   VLDL Cholesterol Cal 21 5 - 40 mg/dL   LDL Chol Calc (NIH) 56 0 - 99 mg/dL  TSH  Result Value Ref Range   TSH 2.890 0.450 - 4.500 uIU/mL  UA/M w/rflx Culture, Routine   Specimen: Urine   URINE  Result Value Ref Range   Specific Gravity, UA 1.015 1.005 - 1.030   pH, UA 5.5 5.0 - 7.5   Color, UA Yellow Yellow   Appearance Ur Clear Clear   Leukocytes,UA Negative Negative   Protein,UA 1+ (A) Negative/Trace   Glucose, UA Negative Negative   Ketones, UA Negative Negative   RBC, UA Trace (A) Negative   Bilirubin, UA Negative Negative   Urobilinogen, Ur 0.2 0.2 - 1.0 mg/dL   Nitrite, UA Negative Negative   Microscopic Examination See below:   PSA  Result Value Ref Range   Prostate Specific  Ag, Serum 1.2 0.0 - 4.0 ng/mL      Assessment & Plan:   Problem List Items Addressed This Visit      Cardiovascular and Mediastinum   CAD (coronary artery disease)    Recheck lipids, continue working on diet and exercise, continue current regimen      Relevant Medications   chlorthalidone (HYGROTON) 25 MG tablet   amLODipine (NORVASC) 10 MG tablet   benazepril (LOTENSIN) 40 MG tablet   carvedilol (COREG) 25 MG tablet   rosuvastatin (CRESTOR) 40 MG tablet   Hypertension - Primary    BPs stable and WNL, continue current regimen      Relevant Medications   chlorthalidone (HYGROTON) 25 MG tablet   amLODipine (NORVASC) 10 MG tablet   benazepril (LOTENSIN) 40 MG tablet   carvedilol (COREG) 25 MG tablet   rosuvastatin (CRESTOR) 40 MG tablet   Other Relevant Orders   CBC with Differential/Platelet (Completed)   Comprehensive metabolic panel (Completed)   UA/M w/rflx Culture, Routine (Completed)     Endocrine   Hypothyroidism    Stable and under good control, continue current regimen      Relevant Medications   carvedilol (COREG) 25 MG tablet   levothyroxine (SYNTHROID) 50 MCG tablet   Other Relevant Orders   TSH (Completed)     Genitourinary   BPH (benign prostatic hyperplasia)    Stable, asymptomatic. Recheck PSA, continue to monitor      Relevant Orders   PSA (Completed)     Other   Hyperlipidemia    Recheck lipids, continue current regimen      Relevant Medications   chlorthalidone (HYGROTON) 25 MG tablet   amLODipine (NORVASC) 10 MG tablet   benazepril (LOTENSIN) 40 MG tablet   carvedilol (COREG) 25 MG tablet   rosuvastatin (CRESTOR) 40 MG tablet   Other Relevant Orders   Lipid Panel w/o Chol/HDL Ratio (Completed)   Gout    Stable without recent flares, continue current regimen and watching diet      Relevant Medications   allopurinol (ZYLOPRIM) 300 MG tablet    Other Visit Diagnoses    Annual physical exam  Discussed aspirin  prophylaxis for myocardial infarction prevention and decision was made to continue ASA  LABORATORY TESTING:  Health maintenance labs ordered today as discussed above.   The natural history of prostate cancer and ongoing controversy regarding screening and potential treatment outcomes of prostate cancer has been discussed with the patient. The meaning of a false positive PSA and a false negative PSA has been discussed. He indicates understanding of the limitations of this screening test and wishes to proceed with screening PSA testing.   IMMUNIZATIONS:   - Tdap: Tetanus vaccination status reviewed: last tetanus booster within 10 years. - Influenza: Up to date - Pneumovax: Up to date - Prevnar: Up to date - HPV: Not applicable - Zostavax vaccine: Up to date  SCREENING: - Colonoscopy: Up to date  Discussed with patient purpose of the colonoscopy is to detect colon cancer at curable precancerous or early stages    PATIENT COUNSELING:    Sexuality: Discussed sexually transmitted diseases, partner selection, use of condoms, avoidance of unintended pregnancy  and contraceptive alternatives.   Advised to avoid cigarette smoking.  I discussed with the patient that most people either abstain from alcohol or drink within safe limits (<=14/week and <=4 drinks/occasion for males, <=7/weeks and <= 3 drinks/occasion for females) and that the risk for alcohol disorders and other health effects rises proportionally with the number of drinks per week and how often a drinker exceeds daily limits.  Discussed cessation/primary prevention of drug use and availability of treatment for abuse.   Diet: Encouraged to adjust caloric intake to maintain  or achieve ideal body weight, to reduce intake of dietary saturated fat and total fat, to limit sodium intake by avoiding high sodium foods and not adding table salt, and to maintain adequate dietary potassium and calcium preferably from fresh fruits, vegetables,  and low-fat dairy products.    stressed the importance of regular exercise  Injury prevention: Discussed safety belts, safety helmets, smoke detector, smoking near bedding or upholstery.   Dental health: Discussed importance of regular tooth brushing, flossing, and dental visits.   Follow up plan: NEXT PREVENTATIVE PHYSICAL DUE IN 1 YEAR. Return in about 6 months (around 06/11/2020) for 6 month f/u.

## 2019-12-12 ENCOUNTER — Telehealth: Payer: Medicare Other

## 2019-12-12 ENCOUNTER — Encounter: Payer: Self-pay | Admitting: Family Medicine

## 2019-12-12 LAB — COMPREHENSIVE METABOLIC PANEL
ALT: 16 IU/L (ref 0–44)
AST: 22 IU/L (ref 0–40)
Albumin/Globulin Ratio: 1.7 (ref 1.2–2.2)
Albumin: 4.4 g/dL (ref 3.8–4.8)
Alkaline Phosphatase: 57 IU/L (ref 39–117)
BUN/Creatinine Ratio: 16 (ref 10–24)
BUN: 25 mg/dL (ref 8–27)
Bilirubin Total: 0.4 mg/dL (ref 0.0–1.2)
CO2: 22 mmol/L (ref 20–29)
Calcium: 9.4 mg/dL (ref 8.6–10.2)
Chloride: 104 mmol/L (ref 96–106)
Creatinine, Ser: 1.55 mg/dL — ABNORMAL HIGH (ref 0.76–1.27)
GFR calc Af Amer: 52 mL/min/{1.73_m2} — ABNORMAL LOW (ref >59)
GFR calc non Af Amer: 45 mL/min/{1.73_m2} — ABNORMAL LOW (ref >59)
Globulin, Total: 2.6 g/dL (ref 1.5–4.5)
Glucose: 105 mg/dL — ABNORMAL HIGH (ref 65–99)
Potassium: 4.8 mmol/L (ref 3.5–5.2)
Sodium: 140 mmol/L (ref 134–144)
Total Protein: 7 g/dL (ref 6.0–8.5)

## 2019-12-12 LAB — CBC WITH DIFFERENTIAL/PLATELET
Basophils Absolute: 0.1 10*3/uL (ref 0.0–0.2)
Basos: 1 %
EOS (ABSOLUTE): 0.3 10*3/uL (ref 0.0–0.4)
Eos: 5 %
Hematocrit: 41.3 % (ref 37.5–51.0)
Hemoglobin: 13.8 g/dL (ref 13.0–17.7)
Immature Grans (Abs): 0 10*3/uL (ref 0.0–0.1)
Immature Granulocytes: 0 %
Lymphocytes Absolute: 2.1 10*3/uL (ref 0.7–3.1)
Lymphs: 31 %
MCH: 29.5 pg (ref 26.6–33.0)
MCHC: 33.4 g/dL (ref 31.5–35.7)
MCV: 88 fL (ref 79–97)
Monocytes Absolute: 0.6 10*3/uL (ref 0.1–0.9)
Monocytes: 9 %
Neutrophils Absolute: 3.6 10*3/uL (ref 1.4–7.0)
Neutrophils: 54 %
Platelets: 252 10*3/uL (ref 150–450)
RBC: 4.68 x10E6/uL (ref 4.14–5.80)
RDW: 15.5 % — ABNORMAL HIGH (ref 11.6–15.4)
WBC: 6.6 10*3/uL (ref 3.4–10.8)

## 2019-12-12 LAB — LIPID PANEL W/O CHOL/HDL RATIO
Cholesterol, Total: 108 mg/dL (ref 100–199)
HDL: 31 mg/dL — ABNORMAL LOW (ref >39)
LDL Chol Calc (NIH): 56 mg/dL (ref 0–99)
Triglycerides: 112 mg/dL (ref 0–149)
VLDL Cholesterol Cal: 21 mg/dL (ref 5–40)

## 2019-12-12 LAB — TSH: TSH: 2.89 u[IU]/mL (ref 0.450–4.500)

## 2019-12-12 LAB — PSA: Prostate Specific Ag, Serum: 1.2 ng/mL (ref 0.0–4.0)

## 2019-12-13 NOTE — Assessment & Plan Note (Signed)
Recheck lipids, continue working on diet and exercise, continue current regimen

## 2019-12-13 NOTE — Assessment & Plan Note (Signed)
Stable and under good control, continue current regimen 

## 2019-12-13 NOTE — Assessment & Plan Note (Signed)
Recheck lipids, continue current regimen 

## 2019-12-13 NOTE — Assessment & Plan Note (Signed)
Stable without recent flares, continue current regimen and watching diet

## 2019-12-13 NOTE — Assessment & Plan Note (Signed)
Stable, asymptomatic. Recheck PSA, continue to monitor

## 2019-12-13 NOTE — Assessment & Plan Note (Signed)
BPs stable and WNL, continue current regimen 

## 2019-12-24 ENCOUNTER — Telehealth: Payer: Self-pay | Admitting: Family Medicine

## 2019-12-24 NOTE — Telephone Encounter (Signed)
Patient notified. Letter reprinted and sent to patient.

## 2019-12-24 NOTE — Telephone Encounter (Signed)
Copied from [place] [id number]. Topic: General - Other >> Dec 24, 2019 10:30 AM Leward Quan A wrote: Reason for CRM: Patient called to say that he would like a call back with his lab results from his physical on 12/11/19.  Please call Ph# [phone]

## 2019-12-24 NOTE — Telephone Encounter (Signed)
Called pt, no answer, LVM requestign call back to office.

## 2019-12-24 NOTE — Telephone Encounter (Signed)
Letter had been generated, ok to read and re-send if desired by pt

## 2019-12-24 NOTE — Telephone Encounter (Signed)
Routing to provider  

## 2020-01-02 ENCOUNTER — Telehealth: Payer: Medicare Other

## 2020-01-02 ENCOUNTER — Ambulatory Visit: Payer: Self-pay | Admitting: General Practice

## 2020-01-02 NOTE — Chronic Care Management (AMB) (Signed)
°  Chronic Care Management   Outreach Note  01/02/2020 Name: Ricky Melton MRN: [id number] DOB: 05-Apr-1951  Referred by: Guadalupe Maple, MD Reason for referral : Chronic Care Management (Initial outreach: RNCM Chronic Disease Management and Care coordination needs)   An unsuccessful telephone outreach was attempted today. The patient was referred to the case management team for assistance with care management and care coordination.   Follow Up Plan: A HIPPA compliant phone message was left for the patient providing contact information and requesting a return call.   Noreene Larsson RN, MSN, [facility] Mobile: [phone]

## 2020-02-10 ENCOUNTER — Ambulatory Visit (INDEPENDENT_AMBULATORY_CARE_PROVIDER_SITE_OTHER): Payer: Medicare Other | Admitting: General Practice

## 2020-02-10 ENCOUNTER — Telehealth: Payer: Medicare Other | Admitting: General Practice

## 2020-02-10 DIAGNOSIS — I2583 Coronary atherosclerosis due to lipid rich plaque: Secondary | ICD-10-CM

## 2020-02-10 DIAGNOSIS — E039 Hypothyroidism, unspecified: Secondary | ICD-10-CM | POA: Diagnosis not present

## 2020-02-10 DIAGNOSIS — E785 Hyperlipidemia, unspecified: Secondary | ICD-10-CM

## 2020-02-10 DIAGNOSIS — N182 Chronic kidney disease, stage 2 (mild): Secondary | ICD-10-CM

## 2020-02-10 DIAGNOSIS — I10 Essential (primary) hypertension: Secondary | ICD-10-CM

## 2020-02-10 DIAGNOSIS — I2510 Atherosclerotic heart disease of native coronary artery without angina pectoris: Secondary | ICD-10-CM

## 2020-02-10 NOTE — Chronic Care Management (AMB) (Signed)
Chronic Care Management   Initial Visit Note  02/10/2020 Name: Ricky Melton MRN: [id number] DOB: Aug 12, 1951  Referred by: Guadalupe Maple, MD Reason for referral : Chronic Care Management (Initial Outreach: 2nd attempt- RNCM Chronic Disease management and Care coordination needs)   Ricky Melton is a 69 y.o. year old male who is a primary care patient of Crissman, Jeannette How, MD. The CCM team was consulted for assistance with chronic disease management and care coordination needs related to CAD, HTN, HLD, CKD Stage 2 and hypothyroidism   Review of patient status, including review of consultants reports, relevant laboratory and other test results, and collaboration with appropriate care team members and the patient's provider was performed as part of comprehensive patient evaluation and provision of chronic care management services.    SDOH (Social Determinants of Health) assessments performed: Yes- exercise- works and works in the yard also [place] activities for detailed interventions related to SDOH     Medications: Outpatient Encounter Medications as of 02/10/2020  Medication Sig  . allopurinol (ZYLOPRIM) 300 MG tablet Take 1 tablet (300 mg total) by mouth daily.  Marland Kitchen amLODipine (NORVASC) 10 MG tablet Take 1 tablet (10 mg total) by mouth daily.  Marland Kitchen aspirin 81 MG tablet Take 81 mg by mouth daily.  . benazepril (LOTENSIN) 40 MG tablet Take 1 tablet (40 mg total) by mouth daily.  . carvedilol (COREG) 25 MG tablet Take 1 tablet (25 mg total) by mouth 2 (two) times daily with a meal.  . chlorthalidone (HYGROTON) 25 MG tablet Take 0.5 tablets (12.5 mg total) by mouth daily.  . clopidogrel (PLAVIX) 75 MG tablet Take 1 tablet (75 mg total) by mouth daily.  Marland Kitchen levothyroxine (SYNTHROID) 50 MCG tablet Take 1 tablet (50 mcg total) by mouth daily.  . rosuvastatin (CRESTOR) 40 MG tablet Take 1 tablet (40 mg total) by mouth daily.   No facility-administered encounter medications on file as of  02/10/2020.     Objective:  BP Readings from Last 3 Encounters:  12/11/19 138/78  11/19/19 118/66  11/14/18 138/74    Goals Addressed            This Visit's Progress   . RNCM: Pt: "I don't add salt to my food" (pt-stated)       CARE PLAN ENTRY (see longtitudinal plan of care for additional care plan information)  Current Barriers:  . Chronic Disease Management support, education, and care coordination needs related to CAD, HTN, HLD, CKD Stage 2, and hypothyroidism  Clinical Goal(s) related to CAD, HTN, HLD, CKD Stage 2, and hypothyroidism  :  Over the next 120 days, patient will:  . Work with the care management team to address educational, disease management, and care coordination needs  . Begin or continue self health monitoring activities as directed today Measure and record blood pressure 3 times per week and adhere to a heart healthy diet . Call provider office for new or worsened signs and symptoms Blood pressure findings outside established parameters, Chest pain, Shortness of breath, and New or worsened symptom related to CAD/HLD/Hypothyroidism/CKD and other chronic conditions . Call care management team with questions or concerns . Verbalize basic understanding of patient centered plan of care established today  Interventions related to CAD, HTN, HLD, CKD Stage 2, and hypothyroidism  :  . Evaluation of current treatment plans and patient's adherence to plan as established by provider.  The patient endorses compliance with plan of care and recommendations by the provider .  Assessed patient understanding of disease states.  Has a good understanding of disease processes. Ask good questions. The patient wanted to know about his lab work and if his thyroid levels were good. Review of lab work and what WNL was. The patient verbalized that at one time her say a kidney specialist but does not now.  Education and support given. . Assessed patient's education and care coordination  needs.  The patient wanted to know why sometimes he got light headed. Talked to the patient about orthostatic hypotension and changing position slowly. The patient will monitor for this more closely.  . Provided disease specific education to patient.  Education on heart healthy diet and watching for hidden sodium in foods. The patient states if he is on his feet a lot he does notice swelling in his feet and legs. 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  Nash Dimmer with appropriate clinical care team members regarding patient needs  Patient Self Care Activities related to CAD, HTN, HLD, CKD Stage 2, and hypothryoidism  :  . Patient is unable to independently self-manage chronic health conditions  Initial goal documentation         Mr. Zelman was given information about Chronic Care Management services today including:  1. CCM service includes personalized support from designated clinical staff supervised by his physician, including individualized plan of care and coordination with other care providers 2. 24/7 contact phone numbers for assistance for urgent and routine care needs. 3. Service will only be billed when office clinical staff spend 20 minutes or more in a month to coordinate care. 4. Only one practitioner may furnish and bill the service in a calendar month. 5. The patient may stop CCM services at any time (effective at the end of the month) by phone call to the office staff. 6. The patient will be responsible for cost sharing (co-pay) of up to 20% of the service fee (after annual deductible is met).  Patient agreed to services and verbal consent obtained.   Plan:   The care management team will reach out to the patient again over the next 30 to 60 days.   Noreene Larsson RN, MSN, [facility] Mobile: [phone]

## 2020-02-10 NOTE — Patient Instructions (Signed)
Visit Information  Goals Addressed            This Visit's Progress   . RNCM: Pt: "I don't add salt to my food" (pt-stated)       CARE PLAN ENTRY (see longtitudinal plan of care for additional care plan information)  Current Barriers:  . Chronic Disease Management support, education, and care coordination needs related to CAD, HTN, HLD, CKD Stage 2, and hypothyroidism  Clinical Goal(s) related to CAD, HTN, HLD, CKD Stage 2, and hypothyroidism  :  Over the next 120 days, patient will:  . Work with the care management team to address educational, disease management, and care coordination needs  . Begin or continue self health monitoring activities as directed today Measure and record blood pressure 3 times per week and adhere to a heart healthy diet . Call provider office for new or worsened signs and symptoms Blood pressure findings outside established parameters, Chest pain, Shortness of breath, and New or worsened symptom related to CAD/HLD/Hypothyroidism/CKD and other chronic conditions . Call care management team with questions or concerns . Verbalize basic understanding of patient centered plan of care established today  Interventions related to CAD, HTN, HLD, CKD Stage 2, and hypothyroidism  :  . Evaluation of current treatment plans and patient's adherence to plan as established by provider.  The patient endorses compliance with plan of care and recommendations by the provider . Assessed patient understanding of disease states.  Has a good understanding of disease processes. Ask good questions. The patient wanted to know about his lab work and if his thyroid levels were good. Review of lab work and what WNL was. The patient verbalized that at one time her say a kidney specialist but does not now.  Education and support given. . Assessed patient's education and care coordination needs.  The patient wanted to know why sometimes he got light headed. Talked to the patient about  orthostatic hypotension and changing position slowly. The patient will monitor for this more closely.  . Provided disease specific education to patient.  Education on heart healthy diet and watching for hidden sodium in foods. The patient states if he is on his feet a lot he does notice swelling in his feet and legs. 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  Nash Dimmer with appropriate clinical care team members regarding patient needs  Patient Self Care Activities related to CAD, HTN, HLD, CKD Stage 2, and hypothryoidism  :  . Patient is unable to independently self-manage chronic health conditions  Initial goal documentation        Ricky Melton was given information about Chronic Care Management services today including:  1. CCM service includes personalized support from designated clinical staff supervised by his physician, including individualized plan of care and coordination with other care providers 2. 24/7 contact phone numbers for assistance for urgent and routine care needs. 3. Service will only be billed when office clinical staff spend 20 minutes or more in a month to coordinate care. 4. Only one practitioner may furnish and bill the service in a calendar month. 5. The patient may stop CCM services at any time (effective at the end of the month) by phone call to the office staff. 6. The patient will be responsible for cost sharing (co-pay) of up to 20% of the service fee (after annual deductible is met).  Patient agreed to services and verbal consent obtained.   Patient verbalizes understanding  of instructions provided today.   The care management team will reach out to the patient again over the next 30 to 60 days.   Ricky Larsson RN, MSN, [facility] Mobile: [phone]

## 2020-04-13 ENCOUNTER — Telehealth: Payer: Self-pay | Admitting: General Practice

## 2020-04-13 ENCOUNTER — Ambulatory Visit (INDEPENDENT_AMBULATORY_CARE_PROVIDER_SITE_OTHER): Payer: Medicare Other | Admitting: General Practice

## 2020-04-13 DIAGNOSIS — E785 Hyperlipidemia, unspecified: Secondary | ICD-10-CM

## 2020-04-13 DIAGNOSIS — I2583 Coronary atherosclerosis due to lipid rich plaque: Secondary | ICD-10-CM

## 2020-04-13 DIAGNOSIS — I10 Essential (primary) hypertension: Secondary | ICD-10-CM | POA: Diagnosis not present

## 2020-04-13 DIAGNOSIS — E039 Hypothyroidism, unspecified: Secondary | ICD-10-CM

## 2020-04-13 DIAGNOSIS — N182 Chronic kidney disease, stage 2 (mild): Secondary | ICD-10-CM | POA: Diagnosis not present

## 2020-04-13 DIAGNOSIS — I2510 Atherosclerotic heart disease of native coronary artery without angina pectoris: Secondary | ICD-10-CM

## 2020-04-13 NOTE — Patient Instructions (Signed)
Visit Information  Goals Addressed              This Visit's Progress     RNCM: Pt: "I don't add salt to my food" (pt-stated)        CARE PLAN ENTRY (see longtitudinal plan of care for additional care plan information)  Current Barriers:   Chronic Disease Management support, education, and care coordination needs related to CAD, HTN, HLD, CKD Stage 2, and hypothyroidism  Clinical Goal(s) related to CAD, HTN, HLD, CKD Stage 2, and hypothyroidism  :  Over the next 120 days, patient will:   Work with the care management team to address educational, disease management, and care coordination needs   Begin or continue self health monitoring activities as directed today Measure and record blood pressure 3 times per week and adhere to a heart healthy diet  Call provider office for new or worsened signs and symptoms Blood pressure findings outside established parameters, Chest pain, Shortness of breath, and New or worsened symptom related to CAD/HLD/Hypothyroidism/CKD and other chronic conditions  Call care management team with questions or concerns  Verbalize basic understanding of patient centered plan of care established today  Interventions related to CAD, HTN, HLD, CKD Stage 2, and hypothyroidism  :   Evaluation of current treatment plans and patient's adherence to plan as established by provider.  The patient endorses compliance with plan of care and recommendations by the provider  Assessed patient understanding of disease states.  Has a good understanding of disease processes. Ask good questions. The patient wanted to know about his lab work and if his thyroid levels were good. Review of lab work and what WNL was. The patient verbalized that at one time he saw a kidney specialist but does not now.  Education and support given.  Assessed patient's education and care coordination needs.  The patient wanted to know why sometimes he got light headed. Talked to the patient about  orthostatic hypotension and changing position slowly. The patient will monitor for this more closely. 04-13-2020: The patient states he has not taken his blood pressure in a while but thinks it is doing okay. Education on the benefits of checking blood pressure regularly due to his chronic conditions. Denies any issues with dizziness at this time.   Provided disease specific education to patient.  Education on heart healthy diet and watching for hidden sodium in foods. The patient states if he is on his feet a lot he does notice swelling in his feet and legs. 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 04-13-2020: the patient states he is not always compliant with a heart healthy diet. Is enjoying fruits and vegetables from the garden.  Reminded the patient to monitor foods high in sodium and fats.   Collaborated with appropriate clinical care team members regarding patient needs.  The patient denies any needs from the pharmacist or the LCSW. Knows they are available if needs arise.   Evaluation of upcoming provider appointments. The patient has a follow up with the pcp on 06-17-2020.  The RNCM will follow up with the patient on 06-22-2020 at 2:30pm.   Patient Self Care Activities related to CAD, HTN, HLD, CKD Stage 2, and hypothryoidism  :   Patient is unable to independently self-manage chronic health conditions  Please see past updates related to this goal by clicking on the "Past Updates" button in the selected goal  Patient verbalizes understanding of instructions provided today.   Telephone follow up appointment with care management team member scheduled for: 06-22-2020 at 2:30 pm  [place], MSN, [facility] Mobile: [phone]

## 2020-04-13 NOTE — Chronic Care Management (AMB) (Signed)
Chronic Care Management   Follow Up Note   04/13/2020 Name: Ricky Melton MRN: [id number] DOB: 1951-08-07  Referred by: Guadalupe Maple, MD Reason for referral : Chronic Care Management (RNCM Follow up Call for Chronic Disease Management and Care Coordiantion Needs)   Ricky Melton is a 69 y.o. year old male who is a primary care patient of Crissman, Jeannette How, MD. The CCM team was consulted for assistance with chronic disease management and care coordination needs.    Review of patient status, including review of consultants reports, relevant laboratory and other test results, and collaboration with appropriate care team members and the patient's provider was performed as part of comprehensive patient evaluation and provision of chronic care management services.    SDOH (Social Determinants of Health) assessments performed: Yes See Care Plan activities for detailed interventions related to [facility])     Outpatient Encounter Medications as of 04/13/2020  Medication Sig  . allopurinol (ZYLOPRIM) 300 MG tablet Take 1 tablet (300 mg total) by mouth daily.  Marland Kitchen amLODipine (NORVASC) 10 MG tablet Take 1 tablet (10 mg total) by mouth daily.  Marland Kitchen aspirin 81 MG tablet Take 81 mg by mouth daily.  . benazepril (LOTENSIN) 40 MG tablet Take 1 tablet (40 mg total) by mouth daily.  . carvedilol (COREG) 25 MG tablet Take 1 tablet (25 mg total) by mouth 2 (two) times daily with a meal.  . chlorthalidone (HYGROTON) 25 MG tablet Take 0.5 tablets (12.5 mg total) by mouth daily.  . clopidogrel (PLAVIX) 75 MG tablet Take 1 tablet (75 mg total) by mouth daily.  Marland Kitchen levothyroxine (SYNTHROID) 50 MCG tablet Take 1 tablet (50 mcg total) by mouth daily.  . rosuvastatin (CRESTOR) 40 MG tablet Take 1 tablet (40 mg total) by mouth daily.   No facility-administered encounter medications on file as of 04/13/2020.     Objective:   BP Readings from Last 3 Encounters:  12/11/19 138/78  11/19/19 118/66  11/14/18 138/74     Goals Addressed              This Visit's Progress   .  RNCM: Pt: "I don't add salt to my food" (pt-stated)        CARE PLAN ENTRY (see longtitudinal plan of care for additional care plan information)  Current Barriers:  . Chronic Disease Management support, education, and care coordination needs related to CAD, HTN, HLD, CKD Stage 2, and hypothyroidism  Clinical Goal(s) related to CAD, HTN, HLD, CKD Stage 2, and hypothyroidism  :  Over the next 120 days, patient will:  . Work with the care management team to address educational, disease management, and care coordination needs  . Begin or continue self health monitoring activities as directed today Measure and record blood pressure 3 times per week and adhere to a heart healthy diet . Call provider office for new or worsened signs and symptoms Blood pressure findings outside established parameters, Chest pain, Shortness of breath, and New or worsened symptom related to CAD/HLD/Hypothyroidism/CKD and other chronic conditions . Call care management team with questions or concerns . Verbalize basic understanding of patient centered plan of care established today  Interventions related to CAD, HTN, HLD, CKD Stage 2, and hypothyroidism  :  . Evaluation of current treatment plans and patient's adherence to plan as established by provider.  The patient endorses compliance with plan of care and recommendations by the provider . Assessed patient understanding of disease states.  Has a good understanding of  disease processes. Ask good questions. The patient wanted to know about his lab work and if his thyroid levels were good. Review of lab work and what WNL was. The patient verbalized that at one time he saw a kidney specialist but does not now.  Education and support given. . Assessed patient's education and care coordination needs.  The patient wanted to know why sometimes he got light headed. Talked to the patient about orthostatic  hypotension and changing position slowly. The patient will monitor for this more closely. 04-13-2020: The patient states he has not taken his blood pressure in a while but thinks it is doing okay. Education on the benefits of checking blood pressure regularly due to his chronic conditions. Denies any issues with dizziness at this time.  . Provided disease specific education to patient.  Education on heart healthy diet and watching for hidden sodium in foods. The patient states if he is on his feet a lot he does notice swelling in his feet and legs. 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 04-13-2020: the patient states he is not always compliant with a heart healthy diet. Is enjoying fruits and vegetables from the garden.  Reminded the patient to monitor foods high in sodium and fats.  Nash Dimmer with appropriate clinical care team members regarding patient needs.  The patient denies any needs from the pharmacist or the LCSW. Knows they are available if needs arise.  . Evaluation of upcoming provider appointments. The patient has a follow up with the pcp on 06-17-2020.  The RNCM will follow up with the patient on 06-22-2020 at 2:30pm.   Patient Self Care Activities related to CAD, HTN, HLD, CKD Stage 2, and hypothryoidism  :  . Patient is unable to independently self-manage chronic health conditions  Please see past updates related to this goal by clicking on the "Past Updates" button in the selected goal          Plan:   Telephone follow up appointment with care management team member scheduled for: 06-22-2020 at 2:30 pm   [place], MSN, [facility] Mobile: [phone]

## 2020-04-27 ENCOUNTER — Other Ambulatory Visit: Payer: Self-pay | Admitting: Family Medicine

## 2020-04-27 DIAGNOSIS — E039 Hypothyroidism, unspecified: Secondary | ICD-10-CM

## 2020-04-27 DIAGNOSIS — I10 Essential (primary) hypertension: Secondary | ICD-10-CM

## 2020-04-27 DIAGNOSIS — M1000 Idiopathic gout, unspecified site: Secondary | ICD-10-CM

## 2020-04-27 DIAGNOSIS — E785 Hyperlipidemia, unspecified: Secondary | ICD-10-CM

## 2020-04-27 NOTE — Telephone Encounter (Signed)
Requested medications are due for refill today? Yes  Requested medications are on active medication list?  Yes  Last Refill:   12/11/2019  # 90 with one refill.    Future visit scheduled?  Yes in one month.    Notes to Clinic:  Medication failed RX refill protocol due to no uric acid in the past 360 days.  Last uric acid level was performed on 11/14/2018.

## 2020-04-27 NOTE — Telephone Encounter (Signed)
Requested Prescriptions  Pending Prescriptions Disp Refills  . allopurinol (ZYLOPRIM) 300 MG tablet [Pharmacy Med Name: ALLOPURINOL  300MG   TAB] 90 tablet 3    Sig: TAKE 1 TABLET BY MOUTH  DAILY     Endocrinology:  Gout Agents Failed - 04/27/2020 11:11 PM      Failed - Uric Acid in normal range and within 360 days    Uric Acid  Date Value Ref Range Status  11/14/2018 4.3 3.7 - 8.6 mg/dL Final    Comment:               Therapeutic target for gout patients: <6.0         Failed - Cr in normal range and within 360 days    Creatinine, Ser  Date Value Ref Range Status  12/11/2019 1.55 (H) 0.76 - 1.27 mg/dL Final         Passed - Valid encounter within last 12 months    Recent Outpatient Visits          4 months ago Essential hypertension   [facility] [place], [place]   11 months ago Essential hypertension   [place], Jeannette How, MD   1 year ago Essential hypertension   [place], Jeannette How, MD   1 year ago Viral URI with cough   [place], [place], [place]   1 year ago Lumbar strain, initial encounter   [place], Lilia [place], [place]      Future Appointments            In 1 month Johnson, Megan P, DO [place], [facility]   In 7 months  [employer], [facility]           . benazepril (LOTENSIN) 40 MG tablet [Pharmacy Med Name: BENAZEPRIL  40MG   TAB] 90 tablet 3    Sig: TAKE 1 TABLET BY MOUTH  DAILY     Cardiovascular:  ACE Inhibitors Failed - 04/27/2020 11:11 PM      Failed - Cr in normal range and within 180 days    Creatinine, Ser  Date Value Ref Range Status  12/11/2019 1.55 (H) 0.76 - 1.27 mg/dL Final         Passed - K in normal range and within 180 days    Potassium  Date Value Ref Range Status  12/11/2019 4.8 3.5 - 5.2 mmol/L Final         Passed - Patient is not pregnant      Passed - Last BP in normal range    BP Readings  from Last 1 Encounters:  12/11/19 138/78         Passed - Valid encounter within last 6 months    Recent Outpatient Visits          4 months ago Essential hypertension   [facility] [place], [place]   11 months ago Essential hypertension   [place], Jeannette How, MD   1 year ago Essential hypertension   [place], Jeannette How, MD   1 year ago Viral URI with cough   [place], Lilia [place], [place]   1 year ago Lumbar strain, initial encounter   [place], Lilia [place], [place]      Future Appointments            In 1 month Wynetta Emery, Barb Merino, DO [employer],  [facility]   In 7 months  [facility], [facility]           Signed Prescriptions Disp Refills   amLODipine (NORVASC) 10 MG tablet 90 tablet 0    Sig: TAKE 1 TABLET BY MOUTH  DAILY     Cardiovascular:  Calcium Channel Blockers Passed - 04/27/2020 11:11 PM      Passed - Last BP in normal range    BP Readings from Last 1 Encounters:  12/11/19 138/78         Passed - Valid encounter within last 6 months    Recent Outpatient Visits          4 months ago Essential hypertension   [facility] [place], [place]   11 months ago Essential hypertension   [place], Jeannette How, MD   1 year ago Essential hypertension   [place] Crissman, Jeannette How, MD   1 year ago Viral URI with cough   [facility], L[place], [place]   1 year ago Lumbar strain, initial encounter   [place], Lilia [place], [place]      Future Appointments            In 1 month Johnson, Megan P, DO [place], [facility]   In 7 months  [employer], [facility]            clopidogrel (PLAVIX) 75 MG tablet 90 tablet 0    Sig: TAKE 1 TABLET BY MOUTH  DAILY     Hematology: Antiplatelets - clopidogrel Failed - 04/27/2020 11:11 PM       Failed - Evaluate AST, ALT within 2 months of therapy initiation.      Passed - ALT in normal range and within 360 days    ALT  Date Value Ref Range Status  12/11/2019 16 0 - 44 IU/L Final   ALT (SGPT) Piccolo, Waived  Date Value Ref Range Status  05/14/2019 22 10 - 47 U/L Final         Passed - AST in normal range and within 360 days    AST  Date Value Ref Range Status  12/11/2019 22 0 - 40 IU/L Final   AST (SGOT) Piccolo, Waived  Date Value Ref Range Status  05/14/2019 33 11 - 38 U/L Final         Passed - HCT in normal range and within 180 days    Hematocrit  Date Value Ref Range Status  12/11/2019 41.3 37.5 - 51.0 % Final         Passed - HGB in normal range and within 180 days    Hemoglobin  Date Value Ref Range Status  12/11/2019 13.8 13.0 - 17.7 g/dL Final         Passed - PLT in normal range and within 180 days    Platelets  Date Value Ref Range Status  12/11/2019 252 150 - 450 x10E3/uL Final         Passed - Valid encounter within last 6 months    Recent Outpatient Visits          4 months ago Essential hypertension   [place], [place], [place]   11 months ago Essential hypertension   [place], Jeannette How, MD   1 year ago Essential hypertension   [place], Jeannette How, MD   1 year ago Viral URI with cough  [place], [place], [place]   1 year ago Lumbar strain, initial encounter   [facility] [place], [place]      Future Appointments            In 1 month Johnson, Megan P, DO [facility], [facility]   In 7 months  [place], [facility]            rosuvastatin (CRESTOR) 40 MG tablet 90 tablet 1    Sig: TAKE 1 TABLET BY MOUTH  DAILY     Cardiovascular:  Antilipid - Statins Failed - 04/27/2020 11:11 PM      Failed - HDL in normal range and within 360 days    HDL  Date Value Ref Range Status  12/11/2019  31 (L) >39 mg/dL Final         Passed - Total Cholesterol in normal range and within 360 days    Cholesterol, Total  Date Value Ref Range Status  12/11/2019 108 100 - 199 mg/dL Final   Cholesterol Piccolo, Waived  Date Value Ref Range Status  05/14/2019 98 <200 mg/dL Final    Comment:                            Desirable                <200                         Borderline High      200- 239                         High                     >239          Passed - LDL in normal range and within 360 days    LDL Chol Calc (NIH)  Date Value Ref Range Status  12/11/2019 56 0 - 99 mg/dL Final         Passed - Triglycerides in normal range and within 360 days    Triglycerides  Date Value Ref Range Status  12/11/2019 112 0 - 149 mg/dL Final   Triglycerides Piccolo,Waived  Date Value Ref Range Status  05/14/2019 205 (H) <150 mg/dL Final    Comment:                            Normal                   <150                         Borderline High     150 - 199                         High                200 - 499                         Very High                >499          Passed - Patient is not pregnant  Passed - Valid encounter within last 12 months    Recent Outpatient Visits          4 months ago Essential hypertension   [facility] [place], [place]   11 months ago Essential hypertension   [place], Jeannette How, MD   1 year ago Essential hypertension   [place], Jeannette How, MD   1 year ago Viral URI with cough   [facility] [place], [place]   1 year ago Lumbar strain, initial encounter   [place], Lilia Argue, [place]      Future Appointments            In 1 month Johnson, Megan P, DO [place], [facility]   In 7 months  [place], [facility]            carvedilol (COREG) 25 MG tablet 180 tablet 0    Sig: TAKE 1 TABLET BY  MOUTH  TWICE DAILY WITH MEALS     Cardiovascular:  Beta Blockers Passed - 04/27/2020 11:11 PM      Passed - Last BP in normal range    BP Readings from Last 1 Encounters:  12/11/19 138/78         Passed - Last Heart Rate in normal range    Pulse Readings from Last 1 Encounters:  12/11/19 60         Passed - Valid encounter within last 6 months    Recent Outpatient Visits          4 months ago Essential hypertension   [facility] [place], [place]   11 months ago Essential hypertension   [facility] Criss[place], Jeannette How, MD   1 year ago Essential hypertension   [place], Jeannette How, MD   1 year ago Viral URI with cough   [facility], Lilia [place], [place]   1 year ago Lumbar strain, initial encounter   [place], Lilia [place], [place]      Future Appointments            In 1 month Johnson, Megan P, DO [place], [facility]   In 7 months  [employer], [facility]            levothyroxine (SYNTHROID) 50 MCG tablet 90 tablet 2    Sig: TAKE 1 TABLET BY MOUTH  DAILY     Endocrinology:  Hypothyroid Agents Failed - 04/27/2020 11:11 PM      Failed - TSH needs to be rechecked within 3 months after an abnormal result. Refill until TSH is due.      Passed - TSH in normal range and within 360 days    TSH  Date Value Ref Range Status  12/11/2019 2.890 0.450 - 4.500 uIU/mL Final         Passed - Valid encounter within last 12 months    Recent Outpatient Visits          4 months ago Essential hypertension   [facility] [place], [place]   11 months ago Essential hypertension   [place] Crissman, Jeannette How, MD   1 year ago Essential hypertension   [place], Jeannette How, MD   1 year ago Viral URI with cough   [place], [place]   1 year ago Lumbar strain, initial encounter  [place], Lilia Arg[place], [place]      Future Appointments            In 1 month Wynetta Emery, Barb Merino, DO [facility], [place]   In 7 months  [employer], [place]

## 2020-04-27 NOTE — Telephone Encounter (Signed)
Requested Prescriptions  Pending Prescriptions Disp Refills   amLODipine (NORVASC) 10 MG tablet [Pharmacy Med Name: amLODIPine Besylate 10 MG Oral Tablet] 90 tablet 0    Sig: TAKE 1 TABLET BY MOUTH  DAILY     Cardiovascular:  Calcium Channel Blockers Passed - 04/27/2020 11:11 PM      Passed - Last BP in normal range    BP Readings from Last 1 Encounters:  12/11/19 138/78         Passed - Valid encounter within last 6 months    Recent Outpatient Visits          4 months ago Essential hypertension   [facility] [employer], [place]   11 months ago Essential hypertension   [place], Jeannette How, [place]   1 year ago Essential hypertension   [place], Jeannette How, [place]   1 year ago Viral URI with cough   [facility], Lilia Argue, [place]   1 year ago Lumbar strain, initial encounter   [place], Lilia Argue, [place]      Future Appointments            In 1 month Johnson, Megan P, DO [place], [facility]   In 7 months  [place], [facility]            clopidogrel (PLAVIX) 75 MG tablet [Pharmacy Med Name: CLOPIDOGREL  75MG   TAB] 90 tablet 3    Sig: TAKE 1 TABLET BY MOUTH  DAILY     Hematology: Antiplatelets - clopidogrel Failed - 04/27/2020 11:11 PM      Failed - Evaluate AST, ALT within 2 months of therapy initiation.      Passed - ALT in normal range and within 360 days    ALT  Date Value Ref Range Status  12/11/2019 16 0 - 44 IU/L Final   ALT (SGPT) Piccolo, Waived  Date Value Ref Range Status  05/14/2019 22 10 - 47 U/L Final         Passed - AST in normal range and within 360 days    AST  Date Value Ref Range Status  12/11/2019 22 0 - 40 IU/L Final   AST (SGOT) Piccolo, Waived  Date Value Ref Range Status  05/14/2019 33 11 - 38 U/L Final         Passed - HCT in normal range and within 180 days    Hematocrit  Date Value Ref Range Status   12/11/2019 41.3 37.5 - 51.0 % Final         Passed - HGB in normal range and within 180 days    Hemoglobin  Date Value Ref Range Status  12/11/2019 13.8 13.0 - 17.7 g/dL Final         Passed - PLT in normal range and within 180 days    Platelets  Date Value Ref Range Status  12/11/2019 252 150 - 450 x10E3/uL Final         Passed - Valid encounter within last 6 months    Recent Outpatient Visits          4 months ago Essential hypertension   [place], [place], [place]   11 months ago Essential hypertension   [place], Jeannette How, [place]   1 year ago Essential hypertension   [place], Jeannette How, [place]   1 year ago Viral URI with cough  [place], [place], [place]   1 year ago Lumbar strain, initial encounter   [place], Lilia Argue, [place]      Future Appointments            In 1 month Johnson, Megan P, DO [place], [place]   In 7 months  [employer], [facility]            allopurinol (ZYLOPRIM) 300 MG tablet [employer] Med Name: ALLOPURINOL  300MG   TAB] 90 tablet 3    Sig: TAKE 1 TABLET BY MOUTH  DAILY     Endocrinology:  Gout Agents Failed - 04/27/2020 11:11 PM      Failed - Uric Acid in normal range and within 360 days    Uric Acid  Date Value Ref Range Status  11/14/2018 4.3 3.7 - 8.6 mg/dL Final    Comment:               Therapeutic target for gout patients: <6.0         Failed - Cr in normal range and within 360 days    Creatinine, Ser  Date Value Ref Range Status  12/11/2019 1.55 (H) 0.76 - 1.27 mg/dL Final         Passed - Valid encounter within last 12 months    Recent Outpatient Visits          4 months ago Essential hypertension   [facility] [employer], [place]   11 months ago Essential hypertension   [place], Jeannette How, MD   1 year ago Essential hypertension    [place], Jeannette How, MD   1 year ago Viral URI with cough   [place], Lilia Argue, [place]   1 year ago Lumbar strain, initial encounter   [place], Lilia Argue, [place]      Future Appointments            In 1 month Johnson, Megan P, DO [facility], [facility]   In 7 months  [employer], [facility]            rosuvastatin (CRESTOR) 40 MG tablet [employer] Med Name: ROSUVASTATIN  40MG   TAB] 90 tablet 3    Sig: TAKE 1 TABLET BY MOUTH  DAILY     Cardiovascular:  Antilipid - Statins Failed - 04/27/2020 11:11 PM      Failed - HDL in normal range and within 360 days    HDL  Date Value Ref Range Status  12/11/2019 31 (L) >39 mg/dL Final         Passed - Total Cholesterol in normal range and within 360 days    Cholesterol, Total  Date Value Ref Range Status  12/11/2019 108 100 - 199 mg/dL Final   Cholesterol Piccolo, Waived  Date Value Ref Range Status  05/14/2019 98 <200 mg/dL Final    Comment:                            Desirable                <200                         Borderline High      200- 239  High                     >239          Passed - LDL in normal range and within 360 days    LDL Chol Calc (NIH)  Date Value Ref Range Status  12/11/2019 56 0 - 99 mg/dL Final         Passed - Triglycerides in normal range and within 360 days    Triglycerides  Date Value Ref Range Status  12/11/2019 112 0 - 149 mg/dL Final   Triglycerides Piccolo,Waived  Date Value Ref Range Status  05/14/2019 205 (H) <150 mg/dL Final    Comment:                            Normal                   <150                         Borderline High     150 - 199                         High                200 - 499                         Very High                >499          Passed - Patient is not pregnant      Passed - Valid encounter within last 12 months    Recent Outpatient  Visits          4 months ago Essential hypertension   [facility] [employer], [place]   11 months ago Essential hypertension   [place], Jeannette How, [place]   1 year ago Essential hypertension   [place], Jeannette How, MD   1 year ago Viral URI with cough   [place], [place], [place]   1 year ago Lumbar strain, initial encounter   [place], Lilia Argue, [place]      Future Appointments            In 1 month Johnson, Megan P, DO [place], [facility]   In 7 months  [place], [facility]            benazepril (LOTENSIN) 40 MG tablet [Pharmacy Med Name: BENAZEPRIL  40MG   TAB] 90 tablet 3    Sig: TAKE 1 TABLET BY MOUTH  DAILY     Cardiovascular:  ACE Inhibitors Failed - 04/27/2020 11:11 PM      Failed - Cr in normal range and within 180 days    Creatinine, Ser  Date Value Ref Range Status  12/11/2019 1.55 (H) 0.76 - 1.27 mg/dL Final         Passed - K in normal range and within 180 days    Potassium  Date Value Ref Range Status  12/11/2019 4.8 3.5 - 5.2 mmol/L Final         Passed - Patient is not pregnant      Passed - Last BP in normal range  BP Readings from Last 1 Encounters:  12/11/19 138/78         Passed - Valid encounter within last 6 months    Recent Outpatient Visits          4 months ago Essential hypertension   [facility] [place], [place]   11 months ago Essential hypertension   [place], Jeannette How, MD   1 year ago Essential hypertension   [place], Jeannette How, MD   1 year ago Viral URI with cough   [facility], [place]   1 year ago Lumbar strain, initial encounter   [place], Lilia Argue, [place]      Future Appointments            In 1 month Johnson, Megan P, DO [place], [place]   In  7 months  [place], [facility]            carvedilol (COREG) 25 MG tablet [employer] Med Name: CARVEDILOL  25MG   TAB] 180 tablet 3    Sig: TAKE 1 TABLET BY MOUTH  TWICE DAILY WITH MEALS     Cardiovascular:  Beta Blockers Passed - 04/27/2020 11:11 PM      Passed - Last BP in normal range    BP Readings from Last 1 Encounters:  12/11/19 138/78         Passed - Last Heart Rate in normal range    Pulse Readings from Last 1 Encounters:  12/11/19 60         Passed - Valid encounter within last 6 months    Recent Outpatient Visits          4 months ago Essential hypertension   [facility], [place]   11 months ago Essential hypertension   [place], Jeannette How, MD   1 year ago Essential hypertension   [place], Jeannette How, MD   1 year ago Viral URI with cough   [facility] [place], Lilia Argue, [place]   1 year ago Lumbar strain, initial encounter   [place], Lilia Argue, [place]      Future Appointments            In 1 month [place] Emery, Megan P, DO [place], [facility]   In 7 months  [facility] [employer], [facility]            levothyroxine (SYNTHROID) 84 MCG tablet [employer] Med Name: Levothyroxine Sodium 50 MCG Oral Tablet] 90 tablet 3    Sig: TAKE 1 TABLET BY MOUTH  DAILY     Endocrinology:  Hypothyroid Agents Failed - 04/27/2020 11:11 PM      Failed - TSH needs to be rechecked within 3 months after an abnormal result. Refill until TSH is due.      Passed - TSH in normal range and within 360 days    TSH  Date Value Ref Range Status  12/11/2019 2.890 0.450 - 4.500 uIU/mL Final         Passed - Valid encounter within last 12 months    Recent Outpatient Visits          4 months ago Essential hypertension   [place], [place], [place]   11 months ago Essential hypertension   [facility], Jeannette How,  MD   1 year ago Essential hypertension  [place], [place]   1 year ago Viral URI with cough   [facility] [place], [place]   1 year ago Lumbar strain, initial encounter   [place], Lilia Argue, [place]      Future Appointments            In 1 month Wynetta Emery, Barb Merino, DO [place], [place]   In 7 months  [employer], [place]

## 2020-04-28 NOTE — Telephone Encounter (Signed)
See message below °

## 2020-06-07 ENCOUNTER — Ambulatory Visit (INDEPENDENT_AMBULATORY_CARE_PROVIDER_SITE_OTHER): Payer: Medicare Other

## 2020-06-07 ENCOUNTER — Other Ambulatory Visit: Payer: Self-pay

## 2020-06-07 DIAGNOSIS — Z23 Encounter for immunization: Secondary | ICD-10-CM

## 2020-06-17 ENCOUNTER — Ambulatory Visit: Payer: Medicare Other | Admitting: Family Medicine

## 2020-06-22 ENCOUNTER — Ambulatory Visit (INDEPENDENT_AMBULATORY_CARE_PROVIDER_SITE_OTHER): Payer: Medicare Other | Admitting: General Practice

## 2020-06-22 ENCOUNTER — Telehealth: Payer: Self-pay

## 2020-06-22 ENCOUNTER — Telehealth: Payer: Self-pay | Admitting: General Practice

## 2020-06-22 DIAGNOSIS — E785 Hyperlipidemia, unspecified: Secondary | ICD-10-CM

## 2020-06-22 DIAGNOSIS — I10 Essential (primary) hypertension: Secondary | ICD-10-CM | POA: Diagnosis not present

## 2020-06-22 DIAGNOSIS — I2510 Atherosclerotic heart disease of native coronary artery without angina pectoris: Secondary | ICD-10-CM

## 2020-06-22 DIAGNOSIS — N182 Chronic kidney disease, stage 2 (mild): Secondary | ICD-10-CM | POA: Diagnosis not present

## 2020-06-22 DIAGNOSIS — E039 Hypothyroidism, unspecified: Secondary | ICD-10-CM

## 2020-06-22 DIAGNOSIS — I2583 Coronary atherosclerosis due to lipid rich plaque: Secondary | ICD-10-CM

## 2020-06-22 NOTE — Patient Instructions (Signed)
Visit Information  Goals Addressed              This Visit's Progress   .  RNCM: Pt: "I don't add salt to my food" (pt-stated)        CARE PLAN ENTRY (see longtitudinal plan of care for additional care plan information)  Current Barriers:  . Chronic Disease Management support, education, and care coordination needs related to CAD, HTN, HLD, CKD Stage 2, and hypothyroidism  Clinical Goal(s) related to CAD, HTN, HLD, CKD Stage 2, and hypothyroidism  :  Over the next 120 days, patient will:  . Work with the care management team to address educational, disease management, and care coordination needs  . Begin or continue self health monitoring activities as directed today Measure and record blood pressure 3 times per week and adhere to a heart healthy diet . Call provider office for new or worsened signs and symptoms Blood pressure findings outside established parameters, Chest pain, Shortness of breath, and New or worsened symptom related to CAD/HLD/Hypothyroidism/CKD and other chronic conditions . Call care management team with questions or concerns . Verbalize basic understanding of patient centered plan of care established today  Interventions related to CAD, HTN, HLD, CKD Stage 2, and hypothyroidism  :  . Evaluation of current treatment plans and patient's adherence to plan as established by provider.  The patient endorses compliance with plan of care and recommendations by the provider.  06-22-2020: The patient had a visit from the nurse that is with his insurance company and the patient states that he did not realize he was taking his thyroid medications right nor his cholesterol. He now is taking his thyroid medication 30 minutes before his other medications and taking his cholesterol medications at night. This is working well for him.  . Assessed patient understanding of disease states.  Has a good understanding of disease processes. Ask good questions. The patient wanted to know about  his lab work and if his thyroid levels were good. Review of lab work and what WNL was. The patient verbalized that at one time he saw a kidney specialist but does not now.  Education and support given. . Assessed patient's education and care coordination needs.  The patient wanted to know why sometimes he got light headed. Talked to the patient about orthostatic hypotension and changing position slowly. The patient will monitor for this more closely. 06-22-2020 The patient states he has not taken his blood pressure in a while but thinks it is doing okay. Education on the benefits of checking blood pressure regularly due to his chronic conditions. States every now and then he will be light headed or dizzy but not lately. Education on orthostatic hypotension and changing positions slowly to prevent injury or falls.  . Provided disease specific education to patient.  Education on heart healthy diet and watching for hidden sodium in foods. The patient states if he is on his feet a lot he does notice swelling in his feet and legs. 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 06-22-2020: the patient states he is not always compliant with a heart healthy diet. Is enjoying fruits and vegetables from the garden.  Reminded the patient to monitor foods high in sodium and fats.  Nash Dimmer with appropriate clinical care team members regarding patient needs.  The patient denies any needs from the pharmacist or the LCSW. Knows they are available if needs arise.  . Evaluation of upcoming  provider appointments. The patient has a follow up with the pcp on 06-24-2020.  The RNCM will follow up with the patient on 08-25-2020 at 2:00 pm.   Patient Self Care Activities related to CAD, HTN, HLD, CKD Stage 2, and hypothryoidism  :  . Patient is unable to independently self-manage chronic health conditions  Please see past updates related to this goal by clicking on the "Past Updates" button in  the selected goal         Patient verbalizes understanding of instructions provided today.   Telephone follow up appointment with care management team member scheduled for:  08-25-2020 at 2 pm  Noreene Larsson RN, MSN, [facility] Mobile: [phone]

## 2020-06-22 NOTE — Chronic Care Management (AMB) (Signed)
Chronic Care Management   Follow Up Note   06/22/2020 Name: Ricky Melton MRN: [id number] DOB: 1951/03/25  Referred by: Guadalupe Maple, MD Reason for referral : Chronic Care Management (RNCM Follow up call for Chronic Disease Management and Care Coordination Needs)   Ricky Melton is a 69 y.o. year old male who is a primary care patient of Crissman, Jeannette How, MD. The CCM team was consulted for assistance with chronic disease management and care coordination needs.    Review of patient status, including review of consultants reports, relevant laboratory and other test results, and collaboration with appropriate care team members and the patient's provider was performed as part of comprehensive patient evaluation and provision of chronic care management services.    SDOH (Social Determinants of Health) assessments performed: Yes See Care Plan activities for detailed interventions related to [facility]     Outpatient Encounter Medications as of 06/22/2020  Medication Sig  . allopurinol (ZYLOPRIM) 300 MG tablet TAKE 1 TABLET BY MOUTH  DAILY  . amLODipine (NORVASC) 10 MG tablet TAKE 1 TABLET BY MOUTH  DAILY  . aspirin 81 MG tablet Take 81 mg by mouth daily.  . benazepril (LOTENSIN) 40 MG tablet TAKE 1 TABLET BY MOUTH  DAILY  . carvedilol (COREG) 25 MG tablet TAKE 1 TABLET BY MOUTH  TWICE DAILY WITH MEALS  . chlorthalidone (HYGROTON) 25 MG tablet Take 0.5 tablets (12.5 mg total) by mouth daily.  . clopidogrel (PLAVIX) 75 MG tablet TAKE 1 TABLET BY MOUTH  DAILY  . levothyroxine (SYNTHROID) 50 MCG tablet TAKE 1 TABLET BY MOUTH  DAILY  . rosuvastatin (CRESTOR) 40 MG tablet TAKE 1 TABLET BY MOUTH  DAILY   No facility-administered encounter medications on file as of 06/22/2020.     Objective:  BP Readings from Last 3 Encounters:  12/11/19 138/78  11/19/19 118/66  11/14/18 138/74    Goals Addressed              This Visit's Progress   .  RNCM: Pt: "I don't add salt to my food"  (pt-stated)        CARE PLAN ENTRY (see longtitudinal plan of care for additional care plan information)  Current Barriers:  . Chronic Disease Management support, education, and care coordination needs related to CAD, HTN, HLD, CKD Stage 2, and hypothyroidism  Clinical Goal(s) related to CAD, HTN, HLD, CKD Stage 2, and hypothyroidism  :  Over the next 120 days, patient will:  . Work with the care management team to address educational, disease management, and care coordination needs  . Begin or continue self health monitoring activities as directed today Measure and record blood pressure 3 times per week and adhere to a heart healthy diet . Call provider office for new or worsened signs and symptoms Blood pressure findings outside established parameters, Chest pain, Shortness of breath, and New or worsened symptom related to CAD/HLD/Hypothyroidism/CKD and other chronic conditions . Call care management team with questions or concerns . Verbalize basic understanding of patient centered plan of care established today  Interventions related to CAD, HTN, HLD, CKD Stage 2, and hypothyroidism  :  . Evaluation of current treatment plans and patient's adherence to plan as established by provider.  The patient endorses compliance with plan of care and recommendations by the provider.  06-22-2020: The patient had a visit from the nurse that is with his insurance company and the patient states that he did not realize he was taking his thyroid medications  right nor his cholesterol. He now is taking his thyroid medication 30 minutes before his other medications and taking his cholesterol medications at night. This is working well for him.  . Assessed patient understanding of disease states.  Has a good understanding of disease processes. Ask good questions. The patient wanted to know about his lab work and if his thyroid levels were good. Review of lab work and what WNL was. The patient verbalized that at one  time he saw a kidney specialist but does not now.  Education and support given. . Assessed patient's education and care coordination needs.  The patient wanted to know why sometimes he got light headed. Talked to the patient about orthostatic hypotension and changing position slowly. The patient will monitor for this more closely. 06-22-2020 The patient states he has not taken his blood pressure in a while but thinks it is doing okay. Education on the benefits of checking blood pressure regularly due to his chronic conditions. States every now and then he will be light headed or dizzy but not lately. Education on orthostatic hypotension and changing positions slowly to prevent injury or falls.  . Provided disease specific education to patient.  Education on heart healthy diet and watching for hidden sodium in foods. The patient states if he is on his feet a lot he does notice swelling in his feet and legs. 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 06-22-2020: the patient states he is not always compliant with a heart healthy diet. Is enjoying fruits and vegetables from the garden.  Reminded the patient to monitor foods high in sodium and fats.  Nash Dimmer with appropriate clinical care team members regarding patient needs.  The patient denies any needs from the pharmacist or the LCSW. Knows they are available if needs arise.  . Evaluation of upcoming provider appointments. The patient has a follow up with the pcp on 06-24-2020.  The RNCM will follow up with the patient on 08-25-2020 at 2:00 pm.   Patient Self Care Activities related to CAD, HTN, HLD, CKD Stage 2, and hypothryoidism  :  . Patient is unable to independently self-manage chronic health conditions  Please see past updates related to this goal by clicking on the "Past Updates" button in the selected goal          Plan:   Telephone follow up appointment with care management team member scheduled for:  08-25-2020 and 2 pm   Noreene Larsson RN, MSN, [facility] Mobile: [phone]

## 2020-06-23 ENCOUNTER — Other Ambulatory Visit: Payer: Self-pay | Admitting: Family Medicine

## 2020-06-23 DIAGNOSIS — M1000 Idiopathic gout, unspecified site: Secondary | ICD-10-CM

## 2020-06-24 ENCOUNTER — Ambulatory Visit (INDEPENDENT_AMBULATORY_CARE_PROVIDER_SITE_OTHER): Payer: Medicare Other | Admitting: Family Medicine

## 2020-06-24 ENCOUNTER — Other Ambulatory Visit: Payer: Self-pay

## 2020-06-24 ENCOUNTER — Encounter: Payer: Self-pay | Admitting: Family Medicine

## 2020-06-24 VITALS — BP 134/68 | HR 66 | Temp 98.4°F | Ht 66.5 in | Wt 257.0 lb

## 2020-06-24 DIAGNOSIS — M1000 Idiopathic gout, unspecified site: Secondary | ICD-10-CM | POA: Diagnosis not present

## 2020-06-24 DIAGNOSIS — H6981 Other specified disorders of Eustachian tube, right ear: Secondary | ICD-10-CM | POA: Diagnosis not present

## 2020-06-24 DIAGNOSIS — E785 Hyperlipidemia, unspecified: Secondary | ICD-10-CM

## 2020-06-24 DIAGNOSIS — I10 Essential (primary) hypertension: Secondary | ICD-10-CM | POA: Diagnosis not present

## 2020-06-24 DIAGNOSIS — N182 Chronic kidney disease, stage 2 (mild): Secondary | ICD-10-CM

## 2020-06-24 DIAGNOSIS — N400 Enlarged prostate without lower urinary tract symptoms: Secondary | ICD-10-CM | POA: Diagnosis not present

## 2020-06-24 DIAGNOSIS — E039 Hypothyroidism, unspecified: Secondary | ICD-10-CM | POA: Diagnosis not present

## 2020-06-24 LAB — MICROSCOPIC EXAMINATION
Bacteria, UA: NONE SEEN
WBC, UA: NONE SEEN /hpf (ref 0–5)

## 2020-06-24 LAB — URINALYSIS, ROUTINE W REFLEX MICROSCOPIC
Bilirubin, UA: NEGATIVE
Glucose, UA: NEGATIVE
Ketones, UA: NEGATIVE
Leukocytes,UA: NEGATIVE
Nitrite, UA: NEGATIVE
Specific Gravity, UA: 1.02 (ref 1.005–1.030)
Urobilinogen, Ur: 0.2 mg/dL (ref 0.2–1.0)
pH, UA: 6.5 (ref 5.0–7.5)

## 2020-06-24 LAB — MICROALBUMIN, URINE WAIVED
Creatinine, Urine Waived: 50 mg/dL (ref 10–300)
Microalb, Ur Waived: 150 mg/L — ABNORMAL HIGH (ref 0–19)
Microalb/Creat Ratio: >300 mg/g — ABNORMAL HIGH (ref <30)

## 2020-06-24 MED ORDER — PREDNISONE 50 MG PO TABS: 50.0000 mg | ORAL_TABLET | Freq: Every day | ORAL | 0 refills | Status: DC

## 2020-06-24 MED ORDER — CHLORTHALIDONE 25 MG PO TABS
12.5000 mg | ORAL_TABLET | Freq: Every day | ORAL | 1 refills | Status: DC
Start: 2020-06-24 — End: 2021-02-03

## 2020-06-24 MED ORDER — CLOPIDOGREL BISULFATE 75 MG PO TABS
75.0000 mg | ORAL_TABLET | Freq: Every day | ORAL | 1 refills | Status: DC
Start: 2020-06-24 — End: 2020-12-15

## 2020-06-24 MED ORDER — BENAZEPRIL HCL 40 MG PO TABS: 40.0000 mg | ORAL_TABLET | Freq: Every day | ORAL | 1 refills | Status: DC

## 2020-06-24 MED ORDER — ROSUVASTATIN CALCIUM 40 MG PO TABS: 40.0000 mg | ORAL_TABLET | Freq: Every day | ORAL | 1 refills | Status: DC

## 2020-06-24 MED ORDER — AMLODIPINE BESYLATE 10 MG PO TABS: 10.0000 mg | ORAL_TABLET | Freq: Every day | ORAL | 1 refills | Status: DC

## 2020-06-24 MED ORDER — CARVEDILOL 25 MG PO TABS: 25.0000 mg | ORAL_TABLET | Freq: Two times a day (BID) | ORAL | 1 refills | Status: DC

## 2020-06-24 MED ORDER — ALLOPURINOL 300 MG PO TABS: 300.0000 mg | ORAL_TABLET | Freq: Every day | ORAL | 1 refills | Status: DC

## 2020-06-24 NOTE — Assessment & Plan Note (Signed)
Rechecking labs today. Await results. Treat as needed.  °

## 2020-06-24 NOTE — Assessment & Plan Note (Signed)
Encouraged diet and exercise with goal of losing 1-2lbs per week. Call with any concerns.  

## 2020-06-24 NOTE — Assessment & Plan Note (Signed)
Under good control on current regimen. Continue current regimen. Continue to monitor. Call with any concerns. Refills given. Labs drawn today.   

## 2020-06-24 NOTE — Progress Notes (Signed)
BP 134/68 (BP Location: Left Arm, Cuff Size: Normal)   Pulse 66   Temp 98.4 F (36.9 C) (Oral)   Ht 5' 6.5" (1.689 m)   Wt 257 lb (116.6 kg)   SpO2 99%   BMI 40.86 kg/m    Subjective:    Patient ID: Ricky Melton, male    DOB: 11/19/1950, 69 y.o.   MRN: [id number]  HPI: Ricky Melton is a 69 y.o. male  Chief Complaint  Patient presents with  . Hypertension    follow up at home readings- 142/71,138/68  . Hyperlipidemia  . Hypothyroidism   EAR PAIN Duration: 2 days Involved ear(s): right Severity:  moderate  Quality:  sharp Fever: no Otorrhea: no Upper respiratory infection symptoms: no Pruritus: no Hearing loss: yes Water immersion no Using Q-tips: no Recurrent otitis media: no Status: stable Treatments attempted: tylenol  HYPERTENSION / HYPERLIPIDEMIA Satisfied with current treatment? yes Duration of hypertension: chronic BP monitoring frequency: not checking BP medication side effects: no Past BP meds: amlodipine, benazepril, chlorhalidone, carvedilol Duration of hyperlipidemia: chronic Cholesterol medication side effects: no Cholesterol supplements: none Past cholesterol medications: crestor Medication compliance: excellent compliance Aspirin: yes Recent stressors: no Recurrent headaches: no Visual changes: no Palpitations: no Dyspnea: no Chest pain: no Lower extremity edema: no Dizzy/lightheaded: no  No gout flares. Tolerating his allopurinol well.   HYPOTHYROIDISM Thyroid control status:controlled Satisfied with current treatment? no Medication side effects: no Medication compliance: excellent compliance Recent dose adjustment:no Fatigue: no Cold intolerance: no Heat intolerance: no Weight gain: no Weight loss: no Constipation: no Diarrhea/loose stools: no Palpitations: no Lower extremity edema: no Anxiety/depressed mood: no   Relevant past medical, surgical, family and social history reviewed and updated as indicated. Interim  medical history since our last visit reviewed. Allergies and medications reviewed and updated.  Review of Systems  Constitutional: Negative.   HENT: Positive for ear pain. Negative for congestion, dental problem, drooling, ear discharge, facial swelling, hearing loss, mouth sores, nosebleeds, postnasal drip, rhinorrhea, sinus pressure, sinus pain, sneezing, sore throat, tinnitus, trouble swallowing and voice change.   Respiratory: Negative.   Cardiovascular: Negative.   Musculoskeletal: Negative.   Psychiatric/Behavioral: Negative.     Per HPI unless specifically indicated above     Objective:    BP 134/68 (BP Location: Left Arm, Cuff Size: Normal)   Pulse 66   Temp 98.4 F (36.9 C) (Oral)   Ht 5' 6.5" (1.689 m)   Wt 257 lb (116.6 kg)   SpO2 99%   BMI 40.86 kg/m   Wt Readings from Last 3 Encounters:  06/24/20 257 lb (116.6 kg)  12/11/19 249 lb (112.9 kg)  11/19/19 249 lb 9.6 oz (113.2 kg)    Physical Exam Vitals and nursing note reviewed.  Constitutional:      General: He is not in acute distress.    Appearance: Normal appearance. He is not ill-appearing, toxic-appearing or diaphoretic.  HENT:     Head: Normocephalic and atraumatic.     Right Ear: Tympanic membrane, ear canal and external ear normal.     Left Ear: Tympanic membrane, ear canal and external ear normal.     Nose: Nose normal.     Mouth/Throat:     Mouth: Mucous membranes are moist.     Pharynx: Oropharynx is clear.  Eyes:     General: No scleral icterus.       Right eye: No discharge.        Left eye: No discharge.  Extraocular Movements: Extraocular movements intact.     Conjunctiva/sclera: Conjunctivae normal.     Pupils: Pupils are equal, round, and reactive to light.  Cardiovascular:     Rate and Rhythm: Normal rate and regular rhythm.     Pulses: Normal pulses.     Heart sounds: Normal heart sounds. No murmur heard.  No friction rub. No gallop.   Pulmonary:     Effort: Pulmonary effort is  normal. No respiratory distress.     Breath sounds: Normal breath sounds. No stridor. No wheezing, rhonchi or rales.  Chest:     Chest wall: No tenderness.  Musculoskeletal:        General: Normal range of motion.     Cervical back: Normal range of motion and neck supple.  Skin:    General: Skin is warm and dry.     Capillary Refill: Capillary refill takes less than 2 seconds.     Coloration: Skin is not jaundiced or pale.     Findings: No bruising, erythema, lesion or rash.  Neurological:     General: No focal deficit present.     Mental Status: He is alert and oriented to person, place, and time. Mental status is at baseline.  Psychiatric:        Mood and Affect: Mood normal.        Behavior: Behavior normal.        Thought Content: Thought content normal.        Judgment: Judgment normal.     Results for orders placed or performed in visit on 06/24/20  Microscopic Examination   Urine  Result Value Ref Range   WBC, UA None seen 0 - 5 /hpf   RBC 3-10 (A) 0 - 2 /hpf   Epithelial Cells (non renal) 0-10 0 - 10 /hpf   Bacteria, UA None seen None seen/Few  Microalbumin, Urine Waived  Result Value Ref Range   Microalb, Ur Waived 150 (H) 0 - 19 mg/L   Creatinine, Urine Waived 50 10 - 300 mg/dL   Microalb/Creat Ratio >300 (H) <30 mg/g  Urinalysis, Routine w reflex microscopic  Result Value Ref Range   Specific Gravity, UA 1.020 1.005 - 1.030   pH, UA 6.5 5.0 - 7.5   Color, UA Yellow Yellow   Appearance Ur Clear Clear   Leukocytes,UA Negative Negative   Protein,UA 2+ (A) Negative/Trace   Glucose, UA Negative Negative   Ketones, UA Negative Negative   RBC, UA Trace (A) Negative   Bilirubin, UA Negative Negative   Urobilinogen, Ur 0.2 0.2 - 1.0 mg/dL   Nitrite, UA Negative Negative   Microscopic Examination See below:       Assessment & Plan:   Problem List Items Addressed This Visit      Cardiovascular and Mediastinum   Hypertension    Under good control on current  regimen. Continue current regimen. Continue to monitor. Call with any concerns. Refills given. Labs drawn today.       Relevant Medications   rosuvastatin (CRESTOR) 40 MG tablet   chlorthalidone (HYGROTON) 25 MG tablet   carvedilol (COREG) 25 MG tablet   benazepril (LOTENSIN) 40 MG tablet   amLODipine (NORVASC) 10 MG tablet     Endocrine   Hypothyroidism - Primary    Rechecking labs today. Await results. Treat as needed.       Relevant Medications   carvedilol (COREG) 25 MG tablet   Other Relevant Orders   CBC with Differential/Platelet   Comprehensive  metabolic panel   TSH     Genitourinary   CKD (chronic kidney disease), stage II    Rechecking labs today. Await results. Treat as needed.       BPH (benign prostatic hyperplasia)    Under good control on current regimen. Continue current regimen. Continue to monitor. Call with any concerns. Refills given. Labs drawn today.       Relevant Orders   CBC with Differential/Platelet   Comprehensive metabolic panel   Urinalysis, Routine w reflex microscopic (Completed)     Other   Hyperlipidemia    Under good control on current regimen. Continue current regimen. Continue to monitor. Call with any concerns. Refills given. Labs drawn today.       Relevant Medications   rosuvastatin (CRESTOR) 40 MG tablet   chlorthalidone (HYGROTON) 25 MG tablet   carvedilol (COREG) 25 MG tablet   benazepril (LOTENSIN) 40 MG tablet   amLODipine (NORVASC) 10 MG tablet   Other Relevant Orders   CBC with Differential/Platelet   Comprehensive metabolic panel   Lipid Panel w/o Chol/HDL Ratio   Gout    Under good control on current regimen. Continue current regimen. Continue to monitor. Call with any concerns. Refills given. Labs drawn today.       Relevant Medications   allopurinol (ZYLOPRIM) 300 MG tablet   Other Relevant Orders   CBC with Differential/Platelet   Comprehensive metabolic panel   Uric acid   Morbid obesity ([place])     Encouraged diet and exercise with goal of losing 1-2lbs per week. Call with any concerns.        Other Visit Diagnoses    ETD (Eustachian tube dysfunction), right       Will treat with prednisone. Call with any concerns or if not getting better. Continue to monitor.    Essential hypertension       Relevant Medications   rosuvastatin (CRESTOR) 40 MG tablet   chlorthalidone (HYGROTON) 25 MG tablet   carvedilol (COREG) 25 MG tablet   benazepril (LOTENSIN) 40 MG tablet   amLODipine (NORVASC) 10 MG tablet   Other Relevant Orders   CBC with Differential/Platelet   Comprehensive metabolic panel   Microalbumin, Urine Waived (Completed)       Follow up plan: Return in about 6 months (around 12/23/2020) for physical.

## 2020-06-25 LAB — COMPREHENSIVE METABOLIC PANEL
ALT: 19 IU/L (ref 0–44)
AST: 18 IU/L (ref 0–40)
Albumin/Globulin Ratio: 1.8 (ref 1.2–2.2)
Albumin: 4.5 g/dL (ref 3.8–4.8)
Alkaline Phosphatase: 55 IU/L (ref 44–121)
BUN/Creatinine Ratio: 13 (ref 10–24)
BUN: 19 mg/dL (ref 8–27)
Bilirubin Total: 0.5 mg/dL (ref 0.0–1.2)
CO2: 22 mmol/L (ref 20–29)
Calcium: 10 mg/dL (ref 8.6–10.2)
Chloride: 104 mmol/L (ref 96–106)
Creatinine, Ser: 1.41 mg/dL — ABNORMAL HIGH (ref 0.76–1.27)
GFR calc Af Amer: 58 mL/min/{1.73_m2} — ABNORMAL LOW (ref >59)
GFR calc non Af Amer: 50 mL/min/{1.73_m2} — ABNORMAL LOW (ref >59)
Globulin, Total: 2.5 g/dL (ref 1.5–4.5)
Glucose: 124 mg/dL — ABNORMAL HIGH (ref 65–99)
Potassium: 4.6 mmol/L (ref 3.5–5.2)
Sodium: 141 mmol/L (ref 134–144)
Total Protein: 7 g/dL (ref 6.0–8.5)

## 2020-06-25 LAB — CBC WITH DIFFERENTIAL/PLATELET
Basophils Absolute: 0.1 10*3/uL (ref 0.0–0.2)
Basos: 1 %
EOS (ABSOLUTE): 0.2 10*3/uL (ref 0.0–0.4)
Eos: 3 %
Hematocrit: 43.7 % (ref 37.5–51.0)
Hemoglobin: 14.4 g/dL (ref 13.0–17.7)
Immature Grans (Abs): 0 10*3/uL (ref 0.0–0.1)
Immature Granulocytes: 0 %
Lymphocytes Absolute: 2.4 10*3/uL (ref 0.7–3.1)
Lymphs: 30 %
MCH: 29 pg (ref 26.6–33.0)
MCHC: 33 g/dL (ref 31.5–35.7)
MCV: 88 fL (ref 79–97)
Monocytes Absolute: 0.6 10*3/uL (ref 0.1–0.9)
Monocytes: 7 %
Neutrophils Absolute: 4.7 10*3/uL (ref 1.4–7.0)
Neutrophils: 59 %
Platelets: 223 10*3/uL (ref 150–450)
RBC: 4.97 x10E6/uL (ref 4.14–5.80)
RDW: 14.1 % (ref 11.6–15.4)
WBC: 7.9 10*3/uL (ref 3.4–10.8)

## 2020-06-25 LAB — LIPID PANEL W/O CHOL/HDL RATIO
Cholesterol, Total: 106 mg/dL (ref 100–199)
HDL: 36 mg/dL — ABNORMAL LOW (ref >39)
LDL Chol Calc (NIH): 49 mg/dL (ref 0–99)
Triglycerides: 118 mg/dL (ref 0–149)
VLDL Cholesterol Cal: 21 mg/dL (ref 5–40)

## 2020-06-25 LAB — TSH: TSH: 3.16 u[IU]/mL (ref 0.450–4.500)

## 2020-06-25 LAB — URIC ACID: Uric Acid: 4.2 mg/dL (ref 3.8–8.4)

## 2020-06-26 ENCOUNTER — Emergency Department: Payer: Medicare Other

## 2020-06-26 ENCOUNTER — Other Ambulatory Visit: Payer: Self-pay

## 2020-06-26 ENCOUNTER — Emergency Department
Admission: EM | Admit: 2020-06-26 | Discharge: 2020-06-26 | Disposition: A | Discharge status: Home / Self Care | Payer: Medicare Other | Attending: Emergency Medicine | Admitting: Emergency Medicine

## 2020-06-26 DIAGNOSIS — Z79899 Other long term (current) drug therapy: Secondary | ICD-10-CM | POA: Insufficient documentation

## 2020-06-26 DIAGNOSIS — I129 Hypertensive chronic kidney disease with stage 1 through stage 4 chronic kidney disease, or unspecified chronic kidney disease: Secondary | ICD-10-CM | POA: Diagnosis not present

## 2020-06-26 DIAGNOSIS — R4781 Slurred speech: Secondary | ICD-10-CM | POA: Diagnosis not present

## 2020-06-26 DIAGNOSIS — I2510 Atherosclerotic heart disease of native coronary artery without angina pectoris: Secondary | ICD-10-CM | POA: Diagnosis not present

## 2020-06-26 DIAGNOSIS — Z79890 Hormone replacement therapy (postmenopausal): Secondary | ICD-10-CM | POA: Insufficient documentation

## 2020-06-26 DIAGNOSIS — R9082 White matter disease, unspecified: Secondary | ICD-10-CM | POA: Diagnosis not present

## 2020-06-26 DIAGNOSIS — I10 Essential (primary) hypertension: Secondary | ICD-10-CM | POA: Diagnosis not present

## 2020-06-26 DIAGNOSIS — E039 Hypothyroidism, unspecified: Secondary | ICD-10-CM | POA: Insufficient documentation

## 2020-06-26 DIAGNOSIS — R29810 Facial weakness: Secondary | ICD-10-CM | POA: Diagnosis present

## 2020-06-26 DIAGNOSIS — G510 Bell's palsy: Secondary | ICD-10-CM

## 2020-06-26 DIAGNOSIS — N182 Chronic kidney disease, stage 2 (mild): Secondary | ICD-10-CM | POA: Insufficient documentation

## 2020-06-26 DIAGNOSIS — Z7982 Long term (current) use of aspirin: Secondary | ICD-10-CM | POA: Insufficient documentation

## 2020-06-26 LAB — COMPREHENSIVE METABOLIC PANEL
ALT: 21 U/L (ref 0–44)
AST: 26 U/L (ref 15–41)
Albumin: 4.4 g/dL (ref 3.5–5.0)
Alkaline Phosphatase: 50 U/L (ref 38–126)
Anion gap: 10 (ref 5–15)
BUN: 37 mg/dL — ABNORMAL HIGH (ref 8–23)
CO2: 21 mmol/L — ABNORMAL LOW (ref 22–32)
Calcium: 9.8 mg/dL (ref 8.9–10.3)
Chloride: 107 mmol/L (ref 98–111)
Creatinine, Ser: 1.66 mg/dL — ABNORMAL HIGH (ref 0.61–1.24)
GFR, Estimated: 41 mL/min — ABNORMAL LOW (ref >60)
Glucose, Bld: 180 mg/dL — ABNORMAL HIGH (ref 70–99)
Potassium: 4.6 mmol/L (ref 3.5–5.1)
Sodium: 138 mmol/L (ref 135–145)
Total Bilirubin: 0.7 mg/dL (ref 0.3–1.2)
Total Protein: 7.7 g/dL (ref 6.5–8.1)

## 2020-06-26 LAB — GLUCOSE, CAPILLARY: Glucose-Capillary: 169 mg/dL — ABNORMAL HIGH (ref 70–99)

## 2020-06-26 LAB — CBC
HCT: 39.7 % (ref 39.0–52.0)
Hemoglobin: 13.3 g/dL (ref 13.0–17.0)
MCH: 29 pg (ref 26.0–34.0)
MCHC: 33.5 g/dL (ref 30.0–36.0)
MCV: 86.5 fL (ref 80.0–100.0)
Platelets: 238 10*3/uL (ref 150–400)
RBC: 4.59 MIL/uL (ref 4.22–5.81)
RDW: 14.3 % (ref 11.5–15.5)
WBC: 11.7 10*3/uL — ABNORMAL HIGH (ref 4.0–10.5)
nRBC: 0 % (ref 0.0–0.2)

## 2020-06-26 LAB — PROTIME-INR
INR: 0.9 (ref 0.8–1.2)
Prothrombin Time: 12.1 seconds (ref 11.4–15.2)

## 2020-06-26 LAB — DIFFERENTIAL
Abs Immature Granulocytes: 0.1 10*3/uL — ABNORMAL HIGH (ref 0.00–0.07)
Basophils Absolute: 0 10*3/uL (ref 0.0–0.1)
Basophils Relative: 0 %
Eosinophils Absolute: 0 10*3/uL (ref 0.0–0.5)
Eosinophils Relative: 0 %
Immature Granulocytes: 1 %
Lymphocytes Relative: 17 %
Lymphs Abs: 1.9 10*3/uL (ref 0.7–4.0)
Monocytes Absolute: 0.2 10*3/uL (ref 0.1–1.0)
Monocytes Relative: 2 %
Neutro Abs: 9.4 10*3/uL — ABNORMAL HIGH (ref 1.7–7.7)
Neutrophils Relative %: 80 %

## 2020-06-26 LAB — APTT: aPTT: 31 seconds (ref 24–36)

## 2020-06-26 MED ORDER — VALACYCLOVIR HCL 1 G PO TABS: 1000.0000 mg | ORAL_TABLET | Freq: Two times a day (BID) | ORAL | 0 refills | Status: DC

## 2020-06-26 NOTE — Discharge Instructions (Signed)
Please follow-up with your primary care provider next week.  Please use the eye drops as we discussed and protect your eye at night.  Take the prednisone that was prescribed by your primary care provider until finished.  Take the valacyclovir in addition to the prednisone.  Return to the emergency department for symptoms that change or worsen if you are unable to schedule an appointment with your primary care provider or the neurologist.

## 2020-06-26 NOTE — ED Provider Notes (Signed)
[facility] Emergency Department Provider Note ____________________________________________   First MD Initiated Contact with Patient 06/26/20 1550     (approximate)  I have reviewed the triage vital signs and the nursing notes.   HISTORY  Chief Complaint Facial Droop  HPI Ricky Melton is a 69 y.o. male with history of CAD, chronic kidney disease, hyperlipidemia, hypertension and other conditions as listed below presents to the emergency department for treatment and evaluation of facial droop and difficulty closing his right eye.  Symptoms started 2 days ago.  He feels that his speech is different.  He denies weakness in any extremity.  He denies confusion, vision changes, decrease in sensation or difficulty swallowing.         Past Medical History:  Diagnosis Date  . Benign hematuria   . CAD (coronary artery disease)   . Chronic kidney disease   . Hyperlipidemia   . Hypertension     Patient Active Problem List   Diagnosis Date Noted  . Advanced care planning/counseling discussion 10/25/2017  . Allergy 07/12/2017  . Morbid obesity ([place]) 10/19/2016  . BPH (benign prostatic hyperplasia) 10/19/2016  . 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 CAD (coronary artery disease) 03/23/2015  . Benign hematuria 03/23/2015  . Hypertension 03/23/2015  . Hyperlipidemia 03/23/2015  . Hypothyroidism 03/23/2015  . Gout 03/23/2015    Past Surgical History:  Procedure Laterality Date  . cardiac stents     x2  . COLONOSCOPY WITH PROPOFOL N/A 05/10/2018   Procedure: COLONOSCOPY WITH PROPOFOL;  Surgeon: Jonathon Bellows, MD;  Location: [facility] ENDOSCOPY;  Service: Gastroenterology;  Laterality: N/A;  . EYE SURGERY Right 2021   cataract sx in [place]     Prior to Admission medications   Medication Sig Start Date End Date Taking? Authorizing Provider  allopurinol (ZYLOPRIM) 300 MG tablet Take 1 tablet (300 mg total) by mouth daily. 06/24/20   Johnson,  Megan P, DO  amLODipine (NORVASC) 10 MG tablet Take 1 tablet (10 mg total) by mouth daily. 06/24/20   Johnson, Megan P, DO  aspirin 81 MG tablet Take 81 mg by mouth daily.    [provider]  benazepril (LOTENSIN) 40 MG tablet Take 1 tablet (40 mg total) by mouth daily. 06/24/20   Johnson, Megan P, DO  carvedilol (COREG) 25 MG tablet Take 1 tablet (25 mg total) by mouth 2 (two) times daily with a meal. 06/24/20   Johnson, Megan P, DO  chlorthalidone (HYGROTON) 25 MG tablet Take 0.5 tablets (12.5 mg total) by mouth daily. 06/24/20   Johnson, Megan P, DO  clopidogrel (PLAVIX) 75 MG tablet Take 1 tablet (75 mg total) by mouth daily. 06/24/20   Johnson, Megan P, DO  levothyroxine (SYNTHROID) 50 MCG tablet TAKE 1 TABLET BY MOUTH  DAILY 04/27/20   Wynetta Emery, Megan P, DO  predniSONE (DELTASONE) 50 MG tablet Take 1 tablet (50 mg total) by mouth daily with breakfast. 06/24/20   Wynetta Emery, Megan P, DO  rosuvastatin (CRESTOR) 40 MG tablet Take 1 tablet (40 mg total) by mouth daily. 06/24/20   Johnson, Megan P, DO  valACYclovir (VALTREX) 1000 MG tablet Take 1 tablet (1,000 mg total) by mouth 2 (two) times daily. 06/26/20   Victorino Dike, FNP    Allergies Patient has no known allergies.  Family History  Problem Relation Age of Onset  . Hypertension Mother   . Diabetes Sister   . Diabetes Son     Social History Social History  Tobacco Use  . Smoking status: Never Smoker  . Smokeless tobacco: Former Systems developer    Types: Secondary school teacher  . Vaping Use: Never used  Substance Use Topics  . Alcohol use: No    Alcohol/week: 0.0 standard drinks  . Drug use: No    Review of Systems  Constitutional: No fever/chills Eyes: No visual changes. ENT: No sore throat. Cardiovascular: Denies chest pain. Respiratory: Denies shortness of breath. Gastrointestinal: No abdominal pain.  No nausea, no vomiting.  No diarrhea.  No constipation. Genitourinary: Negative for dysuria. Musculoskeletal: Negative  for back pain. Skin: Negative for rash. Neurological: Negative for headaches,positive for focal weakness or numbness. ____________________________________________   PHYSICAL EXAM:  VITAL SIGNS: ED Triage Vitals  Enc Vitals Group     BP 06/26/20 1323 (!) 157/65     Pulse Rate 06/26/20 1323 69     Resp 06/26/20 1323 16     Temp 06/26/20 1323 98.1 F (36.7 C)     Temp Source 06/26/20 1323 Oral     SpO2 06/26/20 1323 97 %     Weight 06/26/20 1320 250 lb (113.4 kg)     Height 06/26/20 1320 5\' 6"  (1.676 m)     Head Circumference --      Peak Flow --      Pain Score 06/26/20 1320 0     Pain Loc --      Pain Edu? --      Excl. in [place]? --     Constitutional: Alert and oriented. Well appearing and in no acute distress. Eyes: Conjunctivae are normal. PERRL. EOMI. Head: Atraumatic. Nose: No congestion/rhinnorhea. Mouth/Throat: Mucous membranes are moist.  Oropharynx non-erythematous.  Tongue protrudes midline.  Facial droop on right side. Neck: No stridor.   Hematological/Lymphatic/Immunilogical: No cervical lymphadenopathy. Cardiovascular: Normal rate, regular rhythm. Grossly normal heart sounds.  Good peripheral circulation. Respiratory: Normal respiratory effort.  No retractions. Lungs CTAB. Gastrointestinal: Soft and nontender. No distention. No abdominal bruits. No CVA tenderness. Genitourinary:  Musculoskeletal: No lower extremity tenderness nor edema.  No joint effusions. Neurologic:  Normal speech and language. No gross focal neurologic deficits are appreciated. No gait instability.  Patient unable to raise right eyebrow.  Able to swallow without difficulty.  No pronator drift. Skin:  Skin is warm, dry and intact. No rash noted. Psychiatric: Mood and affect are normal. Speech and behavior are normal.  ____________________________________________   LABS (all labs ordered are listed, but only abnormal results are displayed)  Labs Reviewed  CBC - Abnormal; Notable for the  following components:      Result Value   WBC 11.7 (*)    All other components within normal limits  DIFFERENTIAL - Abnormal; Notable for the following components:   Neutro Abs 9.4 (*)    Abs Immature Granulocytes 0.10 (*)    All other components within normal limits  COMPREHENSIVE METABOLIC PANEL - Abnormal; Notable for the following components:   CO2 21 (*)    Glucose, Bld 180 (*)    BUN 37 (*)    Creatinine, Ser 1.66 (*)    GFR, Estimated 41 (*)    All other components within normal limits  GLUCOSE, CAPILLARY - Abnormal; Notable for the following components:   Glucose-Capillary 169 (*)    All other components within normal limits  PROTIME-INR  APTT  CBG MONITORING, ED   ____________________________________________  EKG  ED ECG REPORT I, Freyja Govea, FNP-BC personally viewed and interpreted this ECG.   Date: 06/26/2020  Rate: 66  Rhythm: normal EKG, normal sinus rhythm  Axis: normal  Intervals:none  ST&T Change: no ST elevation  ____________________________________________  RADIOLOGY  ED MD interpretation:    Head CT is negative for acute intracranial pathology. I, Sherrie George, personally viewed and evaluated these images (plain radiographs) as part of my medical decision making, as well as reviewing the written report by the radiologist.  Official radiology report(s): CT HEAD WO CONTRAST  Result Date: 06/26/2020 CLINICAL DATA:  Slurred speech, right-sided facial paralysis EXAM: CT HEAD WITHOUT CONTRAST TECHNIQUE: Contiguous axial images were obtained from the base of the skull through the vertex without intravenous contrast. COMPARISON:  None. FINDINGS: Brain: No evidence of acute infarction, hemorrhage, hydrocephalus, extra-axial collection or mass lesion/mass effect. Mild periventricular and deep white matter hypodensity. Vascular: No hyperdense vessel or unexpected calcification. Skull: Normal. Negative for fracture or focal lesion. Sinuses/Orbits: No acute  finding. Other: None. IMPRESSION: No acute intracranial pathology. Small-vessel white matter disease. Consider MRI to more sensitively evaluate for acute diffusion restricting infarction if suspected. Electronically Signed   By: Eddie Candle M.D.   On: 06/26/2020 14:10    ____________________________________________   PROCEDURES  Procedure(s) performed (including Critical Care):  Procedures  ____________________________________________   INITIAL IMPRESSION / ASSESSMENT AND PLAN     69 year old male presenting to the emergency department for treatment and evaluation of right-sided facial droop and difficulty closing his right eye for the past 2 days.  See HPI for further details.  Plan will be to review labs and CT results.  Exam is most consistent with Bell's palsy  DIFFERENTIAL DIAGNOSIS  Bell's palsy, CVA, TIA  ED COURSE  CBC is unremarkable.  CMP shows a BUN of 37 with a creatinine of 1.6 which in comparison to previous lab is near patient's baseline.  CT of the head is negative for any acute findings.  Exam is consistent with Bell's palsy and he will be treated with valacyclovir in addition to the prednisone that was prescribed to him by his primary care provider for "fluid in his ear."  Patient was encouraged to call and schedule follow-up appointment with his primary care provider and will also be given information for neurology.    ___________________________________________   FINAL CLINICAL IMPRESSION(S) / ED DIAGNOSES  Final diagnoses:  Bell's palsy     ED Discharge Orders         Ordered    valACYclovir (VALTREX) 1000 MG tablet  2 times daily        06/26/20 1637           Ricky Melton was evaluated in Emergency Department on 06/26/2020 for the symptoms described in the history of present illness. He was evaluated in the context of the global COVID-19 pandemic, which necessitated consideration that the patient might be at risk for infection with the  SARS-CoV-2 virus that causes COVID-19. Institutional protocols and algorithms that pertain to the evaluation of patients at risk for COVID-19 are in a state of rapid change based on information released by regulatory bodies including the CDC and federal and state organizations. These policies and algorithms were followed during the patient's care in the ED.   Note:  This document was prepared using Dragon voice recognition software and may include unintentional dictation errors.   Victorino Dike, FNP 06/26/20 1648    Naaman Plummer, MD 06/26/20 1910

## 2020-06-26 NOTE — ED Triage Notes (Signed)
Pt states he was seen at his doctors Tuesday for fluid on ear- on Thursday he started having slurred speech and family noticed asymmetrical face- pt has R sided facial paralysis noted in eyebrows and smile- LKW Thursday 10/14

## 2020-06-27 ENCOUNTER — Encounter: Payer: Self-pay | Admitting: Family Medicine

## 2020-06-28 ENCOUNTER — Telehealth: Payer: Self-pay | Admitting: General Practice

## 2020-06-28 ENCOUNTER — Telehealth: Payer: Self-pay | Admitting: Family Medicine

## 2020-06-28 ENCOUNTER — Ambulatory Visit: Payer: Self-pay | Admitting: General Practice

## 2020-06-28 DIAGNOSIS — H6981 Other specified disorders of Eustachian tube, right ear: Secondary | ICD-10-CM

## 2020-06-28 DIAGNOSIS — E039 Hypothyroidism, unspecified: Secondary | ICD-10-CM

## 2020-06-28 DIAGNOSIS — I10 Essential (primary) hypertension: Secondary | ICD-10-CM

## 2020-06-28 DIAGNOSIS — I2510 Atherosclerotic heart disease of native coronary artery without angina pectoris: Secondary | ICD-10-CM

## 2020-06-28 DIAGNOSIS — N182 Chronic kidney disease, stage 2 (mild): Secondary | ICD-10-CM

## 2020-06-28 DIAGNOSIS — E785 Hyperlipidemia, unspecified: Secondary | ICD-10-CM

## 2020-06-28 NOTE — Telephone Encounter (Signed)
He should not be out yet. He also went to the ER with an entirely new symptom (facial droop- which he did not have when I saw him). He can take the medicine from the ER, I can refer him to ENT for the pressure in his ears, or I can see him.

## 2020-06-28 NOTE — Patient Instructions (Signed)
Visit Information  Goals Addressed              This Visit's Progress     RNCM: Pt: "I don't add salt to my food" (pt-stated)        CARE PLAN ENTRY (see longtitudinal plan of care for additional care plan information)  Current Barriers:   Chronic Disease Management support, education, and care coordination needs related to CAD, HTN, HLD, CKD Stage 2, and hypothyroidism  Clinical Goal(s) related to CAD, HTN, HLD, CKD Stage 2, and hypothyroidism  :  Over the next 120 days, patient will:   Work with the care management team to address educational, disease management, and care coordination needs   Begin or continue self health monitoring activities as directed today Measure and record blood pressure 3 times per week and adhere to a heart healthy diet  Call provider office for new or worsened signs and symptoms Blood pressure findings outside established parameters, Chest pain, Shortness of breath, and New or worsened symptom related to CAD/HLD/Hypothyroidism/CKD and other chronic conditions  Call care management team with questions or concerns  Verbalize basic understanding of patient centered plan of care established today  Interventions related to CAD, HTN, HLD, CKD Stage 2, and hypothyroidism  :   Evaluation of current treatment plans and patient's adherence to plan as established by provider.  The patient endorses compliance with plan of care and recommendations by the provider.  06-22-2020: The patient had a visit from the nurse that is with his insurance company and the patient states that he did not realize he was taking his thyroid medications right nor his cholesterol. He now is taking his thyroid medication 30 minutes before his other medications and taking his cholesterol medications at night. This is working well for him.   Assessed patient understanding of disease states.  Has a good understanding of disease processes. Ask good questions. The patient wanted to know about  his lab work and if his thyroid levels were good. Review of lab work and what WNL was. The patient verbalized that at one time he saw a kidney specialist but does not now.  Education and support given.  Assessed patient's education and care coordination needs.  The patient wanted to know why sometimes he got light headed. Talked to the patient about orthostatic hypotension and changing position slowly. The patient will monitor for this more closely. 06-22-2020 The patient states he has not taken his blood pressure in a while but thinks it is doing okay. Education on the benefits of checking blood pressure regularly due to his chronic conditions. States every now and then he will be light headed or dizzy but not lately. Education on orthostatic hypotension and changing positions slowly to prevent injury or falls.   Provided disease specific education to patient.  Education on heart healthy diet and watching for hidden sodium in foods. The patient states if he is on his feet a lot he does notice swelling in his feet and legs. 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 06-22-2020: the patient states he is not always compliant with a heart healthy diet. Is enjoying fruits and vegetables from the garden.  Reminded the patient to monitor foods high in sodium and fats.   Evaluation of recent visit with pcp and ER visit. The patient had called and left a message asking the [facility] for a call back. The patient was seen by Dr. Wynetta Emery on 06-24-2020 and  had some fluid build up in his ear. The patient was taking prednisone as prescribed; however had episode of his eye lid dropping and face feeling numb.  The patient went to the ER and was diagnosed with Bells Palsy. The patient wanted to know what this specifically was and if he should be concerned. The patient verbalized understanding of discharge instructions from the ER. He also was having a hard time getting through to the office and  was calling the [facility] for assistance. The patient states that he is feeling better today.  Education on watching for worsening sx and sx and to call the office for changes. Will continue to monitor.   Collaborated with appropriate clinical care team members regarding patient needs.  The patient denies any needs from the pharmacist or the LCSW. Knows they are available if needs arise.   Evaluation of upcoming provider appointments. The patient has a follow up with the pcp on 06-24-2020.  The RNCM will follow up with the patient on 08-25-2020 at 2:00 pm.   Patient Self Care Activities related to CAD, HTN, HLD, CKD Stage 2, and hypothryoidism  :   Patient is unable to independently self-manage chronic health conditions  Please see past updates related to this goal by clicking on the "Past Updates" button in the selected goal         Patient verbalizes understanding of instructions provided today.   Telephone follow up appointment with care management team member scheduled for: 08-25-2020  Noreene Larsson RN, MSN, [facility] Mobile: [phone]

## 2020-06-28 NOTE — Chronic Care Management (AMB) (Signed)
Chronic Care Management   Follow Up Note   06/28/2020 Name: Ricky Melton MRN: [id number] DOB: 18-Dec-1950  Referred by: Guadalupe Maple, MD Reason for referral : Chronic Care Management (RNCM call back after the patient left a message for Chronic Disease Management and Care Coordination Needs)   Ricky DIBELLO is a 69 y.o. year old male who is a primary care patient of Crissman, Jeannette How, MD. The CCM team was consulted for assistance with chronic disease management and care coordination needs.    Review of patient status, including review of consultants reports, relevant laboratory and other test results, and collaboration with appropriate care team members and the patient's provider was performed as part of comprehensive patient evaluation and provision of chronic care management services.    SDOH (Social Determinants of Health) assessments performed: Yes See Care Plan activities for detailed interventions related to [facility])     Outpatient Encounter Medications as of 06/28/2020  Medication Sig  . allopurinol (ZYLOPRIM) 300 MG tablet Take 1 tablet (300 mg total) by mouth daily.  Marland Kitchen amLODipine (NORVASC) 10 MG tablet Take 1 tablet (10 mg total) by mouth daily.  Marland Kitchen aspirin 81 MG tablet Take 81 mg by mouth daily.  . benazepril (LOTENSIN) 40 MG tablet Take 1 tablet (40 mg total) by mouth daily.  . carvedilol (COREG) 25 MG tablet Take 1 tablet (25 mg total) by mouth 2 (two) times daily with a meal.  . chlorthalidone (HYGROTON) 25 MG tablet Take 0.5 tablets (12.5 mg total) by mouth daily.  . clopidogrel (PLAVIX) 75 MG tablet Take 1 tablet (75 mg total) by mouth daily.  Marland Kitchen levothyroxine (SYNTHROID) 50 MCG tablet TAKE 1 TABLET BY MOUTH  DAILY  . predniSONE (DELTASONE) 50 MG tablet Take 1 tablet (50 mg total) by mouth daily with breakfast.  . rosuvastatin (CRESTOR) 40 MG tablet Take 1 tablet (40 mg total) by mouth daily.  . valACYclovir (VALTREX) 1000 MG tablet Take 1 tablet (1,000 mg total) by  mouth 2 (two) times daily.   No facility-administered encounter medications on file as of 06/28/2020.     Objective:   Goals Addressed              This Visit's Progress   .  RNCM: Pt: "I don't add salt to my food" (pt-stated)        CARE PLAN ENTRY (see longtitudinal plan of care for additional care plan information)  Current Barriers:  . Chronic Disease Management support, education, and care coordination needs related to CAD, HTN, HLD, CKD Stage 2, and hypothyroidism  Clinical Goal(s) related to CAD, HTN, HLD, CKD Stage 2, and hypothyroidism  :  Over the next 120 days, patient will:  . Work with the care management team to address educational, disease management, and care coordination needs  . Begin or continue self health monitoring activities as directed today Measure and record blood pressure 3 times per week and adhere to a heart healthy diet . Call provider office for new or worsened signs and symptoms Blood pressure findings outside established parameters, Chest pain, Shortness of breath, and New or worsened symptom related to CAD/HLD/Hypothyroidism/CKD and other chronic conditions . Call care management team with questions or concerns . Verbalize basic understanding of patient centered plan of care established today  Interventions related to CAD, HTN, HLD, CKD Stage 2, and hypothyroidism  :  . Evaluation of current treatment plans and patient's adherence to plan as established by provider.  The patient endorses compliance  with plan of care and recommendations by the provider.  06-22-2020: The patient had a visit from the nurse that is with his insurance company and the patient states that he did not realize he was taking his thyroid medications right nor his cholesterol. He now is taking his thyroid medication 30 minutes before his other medications and taking his cholesterol medications at night. This is working well for him.  . Assessed patient understanding of disease  states.  Has a good understanding of disease processes. Ask good questions. The patient wanted to know about his lab work and if his thyroid levels were good. Review of lab work and what WNL was. The patient verbalized that at one time he saw a kidney specialist but does not now.  Education and support given. . Assessed patient's education and care coordination needs.  The patient wanted to know why sometimes he got light headed. Talked to the patient about orthostatic hypotension and changing position slowly. The patient will monitor for this more closely. 06-22-2020 The patient states he has not taken his blood pressure in a while but thinks it is doing okay. Education on the benefits of checking blood pressure regularly due to his chronic conditions. States every now and then he will be light headed or dizzy but not lately. Education on orthostatic hypotension and changing positions slowly to prevent injury or falls.  . Provided disease specific education to patient.  Education on heart healthy diet and watching for hidden sodium in foods. The patient states if he is on his feet a lot he does notice swelling in his feet and legs. 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 06-22-2020: the patient states he is not always compliant with a heart healthy diet. Is enjoying fruits and vegetables from the garden.  Reminded the patient to monitor foods high in sodium and fats.  . Evaluation of recent visit with pcp and ER visit. The patient had called and left a message asking the [facility] for a call back. The patient was seen by Ricky Melton on 06-24-2020 and had some fluid build up in his ear. The patient was taking prednisone as prescribed; however had episode of his eye lid dropping and face feeling numb.  The patient went to the ER and was diagnosed with Bells Palsy. The patient wanted to know what this specifically was and if he should be concerned. The patient verbalized  understanding of discharge instructions from the ER. He also was having a hard time getting through to the office and was calling the [facility] for assistance. The patient states that he is feeling better today.  Education on watching for worsening sx and sx and to call the office for changes. Will continue to monitor.  Nash Dimmer with appropriate clinical care team members regarding patient needs.  The patient denies any needs from the pharmacist or the LCSW. Knows they are available if needs arise.  . Evaluation of upcoming provider appointments. The patient has a follow up with the pcp on 06-24-2020.  The RNCM will follow up with the patient on 08-25-2020 at 2:00 pm.   Patient Self Care Activities related to CAD, HTN, HLD, CKD Stage 2, and hypothryoidism  :  . Patient is unable to independently self-manage chronic health conditions  Please see past updates related to this goal by clicking on the "Past Updates" button in the selected goal          Plan:  Telephone follow up appointment with care management team member scheduled for: 08-25-2020    Noreene Larsson RN, MSN, [facility] Mobile: [phone]

## 2020-06-28 NOTE — Telephone Encounter (Signed)
  Chronic Care Management   Note  06/28/2020 Name: Ricky Melton MRN: [id number] DOB: 29-Oct-1950  The RNCM called the patient back and the patient has received requested information.   Follow up plan: The care management team will reach out to the patient again over the next 30 to 60  days.   Noreene Larsson RN, MSN, [facility] Mobile: [phone]

## 2020-06-28 NOTE — Telephone Encounter (Signed)
Patient advised as below. Patient verbalizes understanding and is in agreement with treatment plan. Will wait on referral for now.

## 2020-06-28 NOTE — Telephone Encounter (Signed)
Pt called to see if Dr. Wynetta Emery can send him a refill for predniSONE (DELTASONE) 50 MG tablet Pt stated he had to go to the ER due to still having the issue with his ears / please advise

## 2020-07-07 ENCOUNTER — Telehealth: Payer: Self-pay | Admitting: Family Medicine

## 2020-07-07 DIAGNOSIS — H6981 Other specified disorders of Eustachian tube, right ear: Secondary | ICD-10-CM

## 2020-07-07 NOTE — Telephone Encounter (Signed)
If his ear is not better, I'm happy to refer him to ENT, as mentioned before.

## 2020-07-07 NOTE — Telephone Encounter (Signed)
Called and LVM asking for patient to please return my call.  

## 2020-07-07 NOTE — Telephone Encounter (Signed)
Pt called to get a refill for his predniSONE (DELTASONE) 50 MG tablet /Pt states he is still having issues with ear and this really helped/Pt stated it is not as bad as it was during his 10.14.21 visit / but started feeling bad again after his Bell palsy episode /please advise

## 2020-07-08 NOTE — Telephone Encounter (Signed)
PT stated he was ok to see ENT and would like referral placed.

## 2020-07-13 ENCOUNTER — Telehealth: Payer: Self-pay

## 2020-07-13 NOTE — Telephone Encounter (Signed)
If he's only slightly better, I'd advise him to see ENT. If he's entirely better, he doesn't need to. Thanks!

## 2020-07-13 NOTE — Telephone Encounter (Signed)
Pt is slightly better but not completely better. Told pt that he should see ENT. A RF was placed 07/08/2020. Told pt to wait to for someone for ENT to call and schedule a appointment. Pt verbalized understanding.  KP

## 2020-07-13 NOTE — Telephone Encounter (Signed)
Copied from [place] [phone]. Topic: General - Other >> Jul 13, 2020 10:17 AM Rainey Pines A wrote: Patient feels his ears are slightly better and wants to know would Dr. Wynetta Emery like to re-exam his ears or should he go to ENT referral that was placed although he is doing better

## 2020-08-17 DIAGNOSIS — G510 Bell's palsy: Secondary | ICD-10-CM | POA: Diagnosis not present

## 2020-08-17 DIAGNOSIS — R42 Dizziness and giddiness: Secondary | ICD-10-CM | POA: Diagnosis not present

## 2020-08-17 DIAGNOSIS — H903 Sensorineural hearing loss, bilateral: Secondary | ICD-10-CM | POA: Diagnosis not present

## 2020-08-25 ENCOUNTER — Ambulatory Visit: Payer: Self-pay | Admitting: General Practice

## 2020-08-25 ENCOUNTER — Telehealth: Payer: Self-pay | Admitting: General Practice

## 2020-08-25 DIAGNOSIS — E039 Hypothyroidism, unspecified: Secondary | ICD-10-CM

## 2020-08-25 DIAGNOSIS — I2510 Atherosclerotic heart disease of native coronary artery without angina pectoris: Secondary | ICD-10-CM

## 2020-08-25 DIAGNOSIS — N182 Chronic kidney disease, stage 2 (mild): Secondary | ICD-10-CM

## 2020-08-25 DIAGNOSIS — I10 Essential (primary) hypertension: Secondary | ICD-10-CM

## 2020-08-25 DIAGNOSIS — E785 Hyperlipidemia, unspecified: Secondary | ICD-10-CM

## 2020-08-25 DIAGNOSIS — I2583 Coronary atherosclerosis due to lipid rich plaque: Secondary | ICD-10-CM

## 2020-08-25 NOTE — Chronic Care Management (AMB) (Signed)
Chronic Care Management   Follow Up Note   08/25/2020 Name: Ricky Melton MRN: [id number] DOB: 02/27/1951  Referred by: Guadalupe Maple, MD Reason for referral : Chronic Care Management (RNCM for Chronic Disease Management and Care Coordination Needs)   Ricky Melton is a 69 y.o. year old male who is a primary care patient of Crissman, Jeannette How, MD. The CCM team was consulted for assistance with chronic disease management and care coordination needs.    Review of patient status, including review of consultants reports, relevant laboratory and other test results, and collaboration with appropriate care team members and the patient's provider was performed as part of comprehensive patient evaluation and provision of chronic care management services.    SDOH (Social Determinants of Health) assessments performed: Yes See Care Plan activities for detailed interventions related to [facility])     Outpatient Encounter Medications as of 08/25/2020  Medication Sig  . allopurinol (ZYLOPRIM) 300 MG tablet Take 1 tablet (300 mg total) by mouth daily.  Marland Kitchen amLODipine (NORVASC) 10 MG tablet Take 1 tablet (10 mg total) by mouth daily.  Marland Kitchen aspirin 81 MG tablet Take 81 mg by mouth daily.  . benazepril (LOTENSIN) 40 MG tablet Take 1 tablet (40 mg total) by mouth daily.  . carvedilol (COREG) 25 MG tablet Take 1 tablet (25 mg total) by mouth 2 (two) times daily with a meal.  . chlorthalidone (HYGROTON) 25 MG tablet Take 0.5 tablets (12.5 mg total) by mouth daily.  . clopidogrel (PLAVIX) 75 MG tablet Take 1 tablet (75 mg total) by mouth daily.  Marland Kitchen levothyroxine (SYNTHROID) 50 MCG tablet TAKE 1 TABLET BY MOUTH  DAILY  . predniSONE (DELTASONE) 50 MG tablet Take 1 tablet (50 mg total) by mouth daily with breakfast.  . rosuvastatin (CRESTOR) 40 MG tablet Take 1 tablet (40 mg total) by mouth daily.  . valACYclovir (VALTREX) 1000 MG tablet Take 1 tablet (1,000 mg total) by mouth 2 (two) times daily.   No  facility-administered encounter medications on file as of 08/25/2020.     Objective:  BP Readings from Last 3 Encounters:  06/26/20 (!) 160/77  06/24/20 134/68  12/11/19 138/78    Goals Addressed              This Visit's Progress   .  RNCM: Pt: "I don't add salt to my food" (pt-stated)        CARE PLAN ENTRY (see longtitudinal plan of care for additional care plan information)  Current Barriers:  . Chronic Disease Management support, education, and care coordination needs related to CAD, HTN, HLD, CKD Stage 2, and hypothyroidism  Clinical Goal(s) related to CAD, HTN, HLD, CKD Stage 2, and hypothyroidism  :  Over the next 120 days, patient will:  . Work with the care management team to address educational, disease management, and care coordination needs  . Begin or continue self health monitoring activities as directed today Measure and record blood pressure 3 times per week and adhere to a heart healthy diet . Call provider office for new or worsened signs and symptoms Blood pressure findings outside established parameters, Chest pain, Shortness of breath, and New or worsened symptom related to CAD/HLD/Hypothyroidism/CKD and other chronic conditions . Call care management team with questions or concerns . Verbalize basic understanding of patient centered plan of care established today  Interventions related to CAD, HTN, HLD, CKD Stage 2, and hypothyroidism  :  . Evaluation of current treatment plans and patient's adherence to  plan as established by provider.  The patient endorses compliance with plan of care and recommendations by the provider.  06-22-2020: The patient had a visit from the nurse that is with his insurance company and the patient states that he did not realize he was taking his thyroid medications right nor his cholesterol. He now is taking his thyroid medication 30 minutes before his other medications and taking his cholesterol medications at night. This is working  well for him. 08-25-2020: Saw the ENT recently and they could not find any reason for the Bells Palsy. The patient said he is still having some dizziness and will go back 09-22-2020 to see the ENT. If he is still having this they will do an MRI to evaluate further.  . Assessed patient understanding of disease states.  Has a good understanding of disease processes. Ask good questions. The patient wanted to know about his lab work and if his thyroid levels were good. Review of lab work and what WNL was. The patient verbalized that at one time he saw a kidney specialist but does not now.  Education and support given. 08-25-2020: The patient states he is doing well and not having any new issues at this time. His chronic conditions are stable.  . Assessed patient's education and care coordination needs.  The patient wanted to know why sometimes he got light headed. Talked to the patient about orthostatic hypotension and changing position slowly. The patient will monitor for this more closely. 08-25-2020 The patient states he has not taken his blood pressure in a while but thinks it is doing okay. Education on the benefits of checking blood pressure regularly due to his chronic conditions. States every now and then he will be light headed or dizzy but not lately. Education on orthostatic hypotension and changing positions slowly to prevent injury or falls. The patient endorses having normalized blood pressures, especially when checked at the providers office.  . Provided disease specific education to patient.  Education on heart healthy diet and watching for hidden sodium in foods. The patient states if he is on his feet a lot he does notice swelling in his feet and legs. 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 06-22-2020: the patient states he is not always compliant with a heart healthy diet. Is enjoying fruits and vegetables from the garden.  Reminded the patient to monitor foods  high in sodium and fats.  . Evaluation of recent visit with pcp and ER visit. The patient had called and left a message asking the [facility] for a call back. The patient was seen by Dr. Wynetta Emery on 06-24-2020 and had some fluid build up in his ear. The patient was taking prednisone as prescribed; however had episode of his eye lid dropping and face feeling numb.  The patient went to the ER and was diagnosed with Bells Palsy. The patient wanted to know what this specifically was and if he should be concerned. The patient verbalized understanding of discharge instructions from the ER. He also was having a hard time getting through to the office and was calling the [facility] for assistance. The patient states that he is feeling better today.  Education on watching for worsening sx and sx and to call the office for changes. Will continue to monitor.  . Review of the patients mental status in light of his wife being diagnosed early fall with breast cancer. He is happy to report that she is  doing well and her surgery was successful.  Nash Dimmer with appropriate clinical care team members regarding patient needs.  The patient denies any needs from the pharmacist or the LCSW. Knows they are available if needs arise.  . Evaluation of upcoming provider appointments. The patient states that he has an appointment with the ENT on 09-22-2020. No upcoming appointments with the pcp. Education and support given. The patient has the [facility] number to call if needed. Will continue to monitor.   Patient Self Care Activities related to CAD, HTN, HLD, CKD Stage 2, and hypothryoidism  :  . Patient is unable to independently self-manage chronic health conditions  Please see past updates related to this goal by clicking on the "Past Updates" button in the selected goal         There are no care plans to display for this patient.   Plan:   Telephone follow up appointment with care management team member scheduled for: 10-13-2020 1:45  pm   Noreene Larsson RN, MSN, [facility] Mobile: [phone]

## 2020-08-25 NOTE — Patient Instructions (Signed)
Visit Information  Goals Addressed              This Visit's Progress     RNCM: Pt: "I don't add salt to my food" (pt-stated)        CARE PLAN ENTRY (see longtitudinal plan of care for additional care plan information)  Current Barriers:   Chronic Disease Management support, education, and care coordination needs related to CAD, HTN, HLD, CKD Stage 2, and hypothyroidism  Clinical Goal(s) related to CAD, HTN, HLD, CKD Stage 2, and hypothyroidism  :  Over the next 120 days, patient will:   Work with the care management team to address educational, disease management, and care coordination needs   Begin or continue self health monitoring activities as directed today Measure and record blood pressure 3 times per week and adhere to a heart healthy diet  Call provider office for new or worsened signs and symptoms Blood pressure findings outside established parameters, Chest pain, Shortness of breath, and New or worsened symptom related to CAD/HLD/Hypothyroidism/CKD and other chronic conditions  Call care management team with questions or concerns  Verbalize basic understanding of patient centered plan of care established today  Interventions related to CAD, HTN, HLD, CKD Stage 2, and hypothyroidism  :   Evaluation of current treatment plans and patient's adherence to plan as established by provider.  The patient endorses compliance with plan of care and recommendations by the provider.  06-22-2020: The patient had a visit from the nurse that is with his insurance company and the patient states that he did not realize he was taking his thyroid medications right nor his cholesterol. He now is taking his thyroid medication 30 minutes before his other medications and taking his cholesterol medications at night. This is working well for him. 08-25-2020: Saw the ENT recently and they could not find any reason for the Bells Palsy. The patient said he is still having some dizziness and will go  back 09-22-2020 to see the ENT. If he is still having this they will do an MRI to evaluate further.   Assessed patient understanding of disease states.  Has a good understanding of disease processes. Ask good questions. The patient wanted to know about his lab work and if his thyroid levels were good. Review of lab work and what WNL was. The patient verbalized that at one time he saw a kidney specialist but does not now.  Education and support given. 08-25-2020: The patient states he is doing well and not having any new issues at this time. His chronic conditions are stable.   Assessed patient's education and care coordination needs.  The patient wanted to know why sometimes he got light headed. Talked to the patient about orthostatic hypotension and changing position slowly. The patient will monitor for this more closely. 08-25-2020 The patient states he has not taken his blood pressure in a while but thinks it is doing okay. Education on the benefits of checking blood pressure regularly due to his chronic conditions. States every now and then he will be light headed or dizzy but not lately. Education on orthostatic hypotension and changing positions slowly to prevent injury or falls. The patient endorses having normalized blood pressures, especially when checked at the providers office.   Provided disease specific education to patient.  Education on heart healthy diet and watching for hidden sodium in foods. The patient states if he is on his feet a lot he does notice swelling in his feet and legs. 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 06-22-2020: the patient states he is not always compliant with a heart healthy diet. Is enjoying fruits and vegetables from the garden.  Reminded the patient to monitor foods high in sodium and fats.   Evaluation of recent visit with pcp and ER visit. The patient had called and left a message asking the [facility] for a call back. The patient  was seen by Dr. Wynetta Emery on 06-24-2020 and had some fluid build up in his ear. The patient was taking prednisone as prescribed; however had episode of his eye lid dropping and face feeling numb.  The patient went to the ER and was diagnosed with Bells Palsy. The patient wanted to know what this specifically was and if he should be concerned. The patient verbalized understanding of discharge instructions from the ER. He also was having a hard time getting through to the office and was calling the [facility] for assistance. The patient states that he is feeling better today.  Education on watching for worsening sx and sx and to call the office for changes. Will continue to monitor.   Review of the patients mental status in light of his wife being diagnosed early fall with breast cancer. He is happy to report that she is doing well and her surgery was successful.   Collaborated with appropriate clinical care team members regarding patient needs.  The patient denies any needs from the pharmacist or the LCSW. Knows they are available if needs arise.   Evaluation of upcoming provider appointments. The patient states that he has an appointment with the ENT on 09-22-2020. No upcoming appointments with the pcp. Education and support given. The patient has the [facility] number to call if needed. Will continue to monitor.   Patient Self Care Activities related to CAD, HTN, HLD, CKD Stage 2, and hypothryoidism  :   Patient is unable to independently self-manage chronic health conditions  Please see past updates related to this goal by clicking on the "Past Updates" button in the selected goal         The patient verbalized understanding of instructions, educational materials, and care plan provided today and declined offer to receive copy of patient instructions, educational materials, and care plan.   Telephone follow up appointment with care management team member scheduled for: 10-13-2020 at 1:45 pm  [place], MSN,  [facility] Mobile: [phone]

## 2020-09-14 ENCOUNTER — Telehealth: Payer: Self-pay | Admitting: General Practice

## 2020-09-14 ENCOUNTER — Telehealth (INDEPENDENT_AMBULATORY_CARE_PROVIDER_SITE_OTHER): Payer: Medicare Other | Admitting: Family Medicine

## 2020-09-14 ENCOUNTER — Encounter: Payer: Self-pay | Admitting: Family Medicine

## 2020-09-14 ENCOUNTER — Other Ambulatory Visit: Payer: Self-pay

## 2020-09-14 DIAGNOSIS — Z20822 Contact with and (suspected) exposure to covid-19: Secondary | ICD-10-CM

## 2020-09-14 DIAGNOSIS — J040 Acute laryngitis: Secondary | ICD-10-CM

## 2020-09-14 MED ORDER — PREDNISONE 10 MG PO TABS: ORAL_TABLET | ORAL | 0 refills | Status: DC

## 2020-09-14 NOTE — Telephone Encounter (Cosign Needed)
  Chronic Care Management   Note  09/14/2020 Name: Ricky Melton MRN: [id number] DOB: 05/08/1951  Incoming call from the patient stating he had been hoarse for 3 days and wanted to know if the pcp could call him in something or if he needed to come in for an appointment. Education given to call the office in the future.  A message was sent to the pcp, [facility] admin and clinical staff to please reach out to the patient to meet patient needs. Will continue to follow.   Follow up plan: The care management team will reach out to the patient again over the next 30 to 60 days.   Alto Denver RN, MSN, CCM Community Care Coordinator [place]  [facility] Mobile: [phone]

## 2020-09-14 NOTE — Telephone Encounter (Signed)
Seen today. 

## 2020-09-14 NOTE — Progress Notes (Signed)
There were no vitals taken for this visit.   Subjective:    Patient ID: Ricky Melton, male    DOB: 07-Jul-1951, 70 y.o.   MRN: [id number]  HPI: Ricky Melton is a 70 y.o. male  Chief Complaint  Patient presents with  . Hoarse    Pt states he has been hoarse for over a week    UPPER RESPIRATORY TRACT INFECTION- got his booster shot last week and thought it was just that, but was feeling stuffy and tired then, now just having a hoarse voice Duration: about a week to 10 days Worst symptom: hoarse Fever: no Cough: yes Shortness of breath: no Wheezing: no Chest pain: no Chest tightness: no Chest congestion: no Nasal congestion: yes Runny nose: yes Post nasal drip: yes Sneezing: no Sore throat: yes Swollen glands: no Sinus pressure: no Headache: no Face pain: no Toothache: no Ear pain: no  Ear pressure: no  Eyes red/itching:no Eye drainage/crusting: no  Vomiting: no Rash: no Fatigue: yes Sick contacts: no Strep contacts: no  Context: better Recurrent sinusitis: no Relief with OTC cold/cough medications: no  Treatments attempted: cold/sinus   Relevant past medical, surgical, family and social history reviewed and updated as indicated. Interim medical history since our last visit reviewed. Allergies and medications reviewed and updated.  Review of Systems  Constitutional: Negative.   HENT: Positive for congestion, postnasal drip, sore throat and voice change. Negative for dental problem, drooling, ear discharge, ear pain, facial swelling, hearing loss, mouth sores, nosebleeds, rhinorrhea, sinus pressure, sinus pain, sneezing, tinnitus and trouble swallowing.   Respiratory: Negative.   Cardiovascular: Negative.   Psychiatric/Behavioral: Negative.     Per HPI unless specifically indicated above     Objective:    There were no vitals taken for this visit.  Wt Readings from Last 3 Encounters:  06/26/20 250 lb (113.4 kg)  06/24/20 257 lb (116.6 kg)  12/11/19  249 lb (112.9 kg)    Physical Exam Vitals and nursing note reviewed.  Pulmonary:     Effort: Pulmonary effort is normal. No respiratory distress.     Comments: Speaking in full sentences Neurological:     Mental Status: He is alert.  Psychiatric:        Mood and Affect: Mood normal.        Behavior: Behavior normal.        Thought Content: Thought content normal.        Judgment: Judgment normal.     Results for orders placed or performed during the hospital encounter of 06/26/20  Protime-INR  Result Value Ref Range   Prothrombin Time 12.1 11.4 - 15.2 seconds   INR 0.9 0.8 - 1.2  APTT  Result Value Ref Range   aPTT 31 24 - 36 seconds  CBC  Result Value Ref Range   WBC 11.7 (H) 4.0 - 10.5 K/uL   RBC 4.59 4.22 - 5.81 MIL/uL   Hemoglobin 13.3 13.0 - 17.0 g/dL   HCT 34.1 93.7 - 90.2 %   MCV 86.5 80.0 - 100.0 fL   MCH 29.0 26.0 - 34.0 pg   MCHC 33.5 30.0 - 36.0 g/dL   RDW 40.9 73.5 - 32.9 %   Platelets 238 150 - 400 K/uL   nRBC 0.0 0.0 - 0.2 %  Differential  Result Value Ref Range   Neutrophils Relative % 80 %   Neutro Abs 9.4 (H) 1.7 - 7.7 K/uL   Lymphocytes Relative 17 %   Lymphs Abs  1.9 0.7 - 4.0 K/uL   Monocytes Relative 2 %   Monocytes Absolute 0.2 0.1 - 1.0 K/uL   Eosinophils Relative 0 %   Eosinophils Absolute 0.0 0.0 - 0.5 K/uL   Basophils Relative 0 %   Basophils Absolute 0.0 0.0 - 0.1 K/uL   Immature Granulocytes 1 %   Abs Immature Granulocytes 0.10 (H) 0.00 - 0.07 K/uL  Comprehensive metabolic panel  Result Value Ref Range   Sodium 138 135 - 145 mmol/L   Potassium 4.6 3.5 - 5.1 mmol/L   Chloride 107 98 - 111 mmol/L   CO2 21 (L) 22 - 32 mmol/L   Glucose, Bld 180 (H) 70 - 99 mg/dL   BUN 37 (H) 8 - 23 mg/dL   Creatinine, Ser 1.66 (H) 0.61 - 1.24 mg/dL   Calcium 9.8 8.9 - 10.3 mg/dL   Total Protein 7.7 6.5 - 8.1 g/dL   Albumin 4.4 3.5 - 5.0 g/dL   AST 26 15 - 41 U/L   ALT 21 0 - 44 U/L   Alkaline Phosphatase 50 38 - 126 U/L   Total Bilirubin 0.7 0.3  - 1.2 mg/dL   GFR, Estimated 41 (L) >60 mL/min   Anion gap 10 5 - 15  Glucose, capillary  Result Value Ref Range   Glucose-Capillary 169 (H) 70 - 99 mg/dL      Assessment & Plan:   Problem List Items Addressed This Visit   None   Visit Diagnoses    Laryngitis    -  Primary   Will treat with prednisone. Call if not getting better or getting worse. Testing for COVID. Call with any concerns.    Suspected COVID-19 virus infection       Will get swabbed. Self-quarantine until results are back. Continue to monitor.    Relevant Orders   Novel Coronavirus, NAA ([facility])       Follow up plan: Return if symptoms worsen or fail to improve.   . This visit was completed via telephone due to the restrictions of the COVID-19 pandemic. All issues as above were discussed and addressed but no physical exam was performed. If it was felt that the patient should be evaluated in the office, they were directed there. The patient verbally consented to this visit. Patient was unable to complete an audio/visual visit due to Lack of equipment. Due to the catastrophic nature of the COVID-19 pandemic, this visit was done through audio contact only. . Location of the patient: home . Location of the provider: work . Those involved with this call:  . Provider: Park Liter, DO . CMA: Louanna Raw, CMa . Front Desk/Registration: Barth Kirks  . Time spent on call: 21 minutes on the phone discussing health concerns. 30 minutes total spent in review of patient's record and preparation of their chart.

## 2020-09-16 LAB — NOVEL CORONAVIRUS, NAA: SARS-CoV-2, NAA: NOT DETECTED

## 2020-09-16 LAB — SARS-COV-2, NAA 2 DAY TAT

## 2020-09-22 DIAGNOSIS — K219 Gastro-esophageal reflux disease without esophagitis: Secondary | ICD-10-CM | POA: Diagnosis not present

## 2020-09-22 DIAGNOSIS — G510 Bell's palsy: Secondary | ICD-10-CM | POA: Diagnosis not present

## 2020-09-22 DIAGNOSIS — R0602 Shortness of breath: Secondary | ICD-10-CM | POA: Diagnosis not present

## 2020-10-13 ENCOUNTER — Telehealth: Payer: Self-pay | Admitting: General Practice

## 2020-10-13 ENCOUNTER — Ambulatory Visit (INDEPENDENT_AMBULATORY_CARE_PROVIDER_SITE_OTHER): Payer: Medicare Other | Admitting: General Practice

## 2020-10-13 DIAGNOSIS — I2583 Coronary atherosclerosis due to lipid rich plaque: Secondary | ICD-10-CM

## 2020-10-13 DIAGNOSIS — E785 Hyperlipidemia, unspecified: Secondary | ICD-10-CM

## 2020-10-13 DIAGNOSIS — I10 Essential (primary) hypertension: Secondary | ICD-10-CM

## 2020-10-13 DIAGNOSIS — I2510 Atherosclerotic heart disease of native coronary artery without angina pectoris: Secondary | ICD-10-CM

## 2020-10-13 NOTE — Patient Instructions (Signed)
Visit Information  PATIENT GOALS: Goals Addressed              This Visit's Progress   .  COMPLETED: RNCM: Pt: "I don't add salt to my food" (pt-stated)        CARE PLAN ENTRY (see longtitudinal plan of care for additional care plan information)  Current Barriers: Closing this goal and opening in new ELS . Chronic Disease Management support, education, and care coordination needs related to CAD, HTN, HLD, CKD Stage 2, and hypothyroidism  Clinical Goal(s) related to CAD, HTN, HLD, CKD Stage 2, and hypothyroidism  :  Over the next 120 days, patient will:  . Work with the care management team to address educational, disease management, and care coordination needs  . Begin or continue self health monitoring activities as directed today Measure and record blood pressure 3 times per week and adhere to a heart healthy diet . Call provider office for new or worsened signs and symptoms Blood pressure findings outside established parameters, Chest pain, Shortness of breath, and New or worsened symptom related to CAD/HLD/Hypothyroidism/CKD and other chronic conditions . Call care management team with questions or concerns . Verbalize basic understanding of patient centered plan of care established today  Interventions related to CAD, HTN, HLD, CKD Stage 2, and hypothyroidism  :  . Evaluation of current treatment plans and patient's adherence to plan as established by provider.  The patient endorses compliance with plan of care and recommendations by the provider.  06-22-2020: The patient had a visit from the nurse that is with his insurance company and the patient states that he did not realize he was taking his thyroid medications right nor his cholesterol. He now is taking his thyroid medication 30 minutes before his other medications and taking his cholesterol medications at night. This is working well for him. 08-25-2020: Saw the ENT recently and they could not find any reason for the Bells Palsy.  The patient said he is still having some dizziness and will go back 09-22-2020 to see the ENT. If he is still having this they will do an MRI to evaluate further.  . Assessed patient understanding of disease states.  Has a good understanding of disease processes. Ask good questions. The patient wanted to know about his lab work and if his thyroid levels were good. Review of lab work and what WNL was. The patient verbalized that at one time he saw a kidney specialist but does not now.  Education and support given. 08-25-2020: The patient states he is doing well and not having any new issues at this time. His chronic conditions are stable.  . Assessed patient's education and care coordination needs.  The patient wanted to know why sometimes he got light headed. Talked to the patient about orthostatic hypotension and changing position slowly. The patient will monitor for this more closely. 08-25-2020 The patient states he has not taken his blood pressure in a while but thinks it is doing okay. Education on the benefits of checking blood pressure regularly due to his chronic conditions. States every now and then he will be light headed or dizzy but not lately. Education on orthostatic hypotension and changing positions slowly to prevent injury or falls. The patient endorses having normalized blood pressures, especially when checked at the providers office.  . Provided disease specific education to patient.  Education on heart healthy diet and watching for hidden sodium in foods. The patient states if he is on his feet a lot  he does notice swelling in his feet and legs. 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 06-22-2020: the patient states he is not always compliant with a heart healthy diet. Is enjoying fruits and vegetables from the garden.  Reminded the patient to monitor foods high in sodium and fats.  . Evaluation of recent visit with pcp and ER visit. The patient had called  and left a message asking the [facility] for a call back. The patient was seen by Dr. Wynetta Emery on 06-24-2020 and had some fluid build up in his ear. The patient was taking prednisone as prescribed; however had episode of his eye lid dropping and face feeling numb.  The patient went to the ER and was diagnosed with Bells Palsy. The patient wanted to know what this specifically was and if he should be concerned. The patient verbalized understanding of discharge instructions from the ER. He also was having a hard time getting through to the office and was calling the [facility] for assistance. The patient states that he is feeling better today.  Education on watching for worsening sx and sx and to call the office for changes. Will continue to monitor.  . Review of the patients mental status in light of his wife being diagnosed early fall with breast cancer. He is happy to report that she is doing well and her surgery was successful.  Nash Dimmer with appropriate clinical care team members regarding patient needs.  The patient denies any needs from the pharmacist or the LCSW. Knows they are available if needs arise.  . Evaluation of upcoming provider appointments. The patient states that he has an appointment with the ENT on 09-22-2020. No upcoming appointments with the pcp. Education and support given. The patient has the [facility] number to call if needed. Will continue to monitor.   Patient Self Care Activities related to CAD, HTN, HLD, CKD Stage 2, and hypothryoidism  :  . Patient is unable to independently self-manage chronic health conditions  Please see past updates related to this goal by clicking on the "Past Updates" button in the selected goal         The patient verbalized understanding of instructions, educational materials, and care plan provided today and declined offer to receive copy of patient instructions, educational materials, and care plan.   Telephone follow up appointment with care management team  member scheduled for: 12-15-2020 at 2:15 pm  [place], MSN, [facility] Mobile: [phone]

## 2020-10-13 NOTE — Chronic Care Management (AMB) (Signed)
Chronic Care Management   CCM RN Visit Note  10/13/2020 Name: Ricky Melton MRN: [id number] DOB: 06-03-1951  Subjective: Ricky Melton is a 70 y.o. year old male who is a primary care patient of Crissman, Jeannette How, MD. The care management team was consulted for assistance with disease management and care coordination needs.    Engaged with patient by telephone for follow up visit in response to provider referral for case management and/or care coordination services.   Consent to Services:  The patient was given information about Chronic Care Management services, agreed to services, and gave verbal consent prior to initiation of services.  Please see initial visit note for detailed documentation.   Patient agreed to services and verbal consent obtained.   Assessment: Review of patient past medical history, allergies, medications, health status, including review of consultants reports, laboratory and other test data, was performed as part of comprehensive evaluation and provision of chronic care management services.   SDOH (Social Determinants of Health) assessments and interventions performed:    CCM Care Plan  No Known Allergies  Outpatient Encounter Medications as of 10/13/2020  Medication Sig   allopurinol (ZYLOPRIM) 300 MG tablet Take 1 tablet (300 mg total) by mouth daily.   amLODipine (NORVASC) 10 MG tablet Take 1 tablet (10 mg total) by mouth daily.   aspirin 81 MG tablet Take 81 mg by mouth daily.   benazepril (LOTENSIN) 40 MG tablet Take 1 tablet (40 mg total) by mouth daily.   carvedilol (COREG) 25 MG tablet Take 1 tablet (25 mg total) by mouth 2 (two) times daily with a meal.   chlorthalidone (HYGROTON) 25 MG tablet Take 0.5 tablets (12.5 mg total) by mouth daily.   clopidogrel (PLAVIX) 75 MG tablet Take 1 tablet (75 mg total) by mouth daily.   levothyroxine (SYNTHROID) 50 MCG tablet TAKE 1 TABLET BY MOUTH  DAILY   predniSONE (DELTASONE) 10 MG tablet 6 tabs today and  tomorrow, 5 tabs the next 2 days, decrease by 1 every other day until gone   rosuvastatin (CRESTOR) 40 MG tablet Take 1 tablet (40 mg total) by mouth daily.   valACYclovir (VALTREX) 1000 MG tablet Take 1 tablet (1,000 mg total) by mouth 2 (two) times daily.   No facility-administered encounter medications on file as of 10/13/2020.    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   CAD (coronary artery disease) 03/23/2015   Benign hematuria 03/23/2015   Hypertension 03/23/2015   Hyperlipidemia 03/23/2015   Hypothyroidism 03/23/2015   Gout 03/23/2015    Conditions to be addressed/monitored:CAD, HTN and HLD  Care Plan : RNCM: Coronary Artery Disease (Adult) and HLD  Updates made by Vanita Ingles since 10/13/2020 12:00 AM    Problem: RNCM: Disease Progression (Coronary Artery Disease)   Priority: Medium    Goal: RNCM: Disease Progression Prevented or Minimized   Priority: Medium  Note:   Current Barriers:   Poorly controlled hyperlipidemia, complicated by ENT issue- COVID negative, HTN  Current antihyperlipidemic regimen:Crestor 40 mg   Most recent lipid panel:     Component Value Date/Time   CHOL 106 06/24/2020 0837   CHOL 98 05/14/2019 1300   TRIG 118 06/24/2020 0837   TRIG 205 (H) 05/14/2019 1300   HDL 36 (L) 06/24/2020 5003  CHOLHDL 4.9 10/25/2017 1010   VLDL 41 (H) 05/14/2019 1300   LDLCALC 49 06/24/2020 0837     ASCVD risk enhancing conditions: age >20,  HTN, CKD,  former smoker  Lacks social connections  Does not contact provider office for Health visitor Clinical Goal(s):   Over the next 120 days, patient will work with Consulting civil engineer, providers, and care team towards execution of optimized self-health  management plan  Over the next 120 days, patient will verbalize understanding of plan for effective management of HLD and CAD  Over the next 120 days, patient will work with [facility] and pcp to address needs related to changes in chronic conditions of HLD and CAD  Interventions:  Collaboration with Guadalupe Maple, MD regarding development and update of comprehensive plan of care as evidenced by provider attestation and co-signature  Inter-disciplinary care team collaboration (see longitudinal plan of care)  Medication review performed; medication list updated in electronic medical record.   Inter-disciplinary care team collaboration (see longitudinal plan of care)  Referred to pharmacy team for assistance with HLD medication management  Evaluation of current treatment plan related to CAD and HLD  and patient's adherence to plan as established by provider.  Advised patient to call the office for changes in condition or questions   Provided education to patient re: heart healthy diet, staying safe, monitoring for new concerns   Reviewed medications with patient and discussed compliance   Discussed plans with patient for ongoing care management follow up and provided patient with direct contact information for care management team   Patient Goals/Self-Care Activities:  Over the next 120 days, patient will:   - call for medicine refill 2 or 3 days before it runs out - call if I am sick and can't take my medicine - keep a list of all the medicines I take; vitamins and herbals too - learn to read medicine labels - use a pillbox to sort medicine - use an alarm clock or phone to remind me to take my medicine - drink 6 to 8 glasses of water each day - eat 3 to 5 servings of fruits and vegetables each day - eat 5 or 6 small meals each day - fill half the plate with nonstarchy vegetables - limit fast food meals to no more than 1 per week - manage portion size - prepare main meal at  home 3 to 5 days each week - read food labels for fat, fiber, carbohydrates and portion size - reduce red meat to 2 to 3 times a week - be open to making changes - I can manage, know and watch for signs of a heart attack - if I have chest pain, call for help - learn about small changes that will make a big difference - learn my personal risk factors  - barriers to treatment adherence reviewed and addressed - communicable disease prevention promoted - functional limitation screening reviewed - healthy lifestyle promoted - rescue (action) plan developed - response to pharmacologic therapy monitored - self-awareness of signs/symptoms of worsening disease encouraged  Follow Up Plan: Telephone follow up appointment with care management team member scheduled for:12-15-2020 at 2:15 pm     Task: RNCM: Alleviate Barriers to Coronary Artery Disease Therapy   Note:   Care Management Activities:    - barriers to treatment adherence reviewed and addressed - communicable disease prevention promoted - functional limitation screening reviewed - healthy lifestyle promoted - rescue (action) plan developed - response  to pharmacologic therapy monitored - self-awareness of signs/symptoms of worsening disease encouraged       Care Plan : RNCM: Hypertension (Adult)  Updates made by Vanita Ingles since 10/13/2020 12:00 AM    Problem: RNCM: Hypertension (Hypertension)   Priority: Medium    Goal: RNCM: Hypertension Monitored   Priority: Medium  Note:   Objective:   Last practice recorded BP readings:  BP Readings from Last 3 Encounters:  06/26/20 (!) 160/77  06/24/20 134/68  12/11/19 138/78     Most recent eGFR/CrCl: No results found for: EGFR  No components found for: CRCL Current Barriers:   Knowledge Deficits related to basic understanding of hypertension pathophysiology and self care management  Knowledge Deficits related to understanding of medications prescribed for management of  hypertension  Limited Social Support  Lacks social connections  Does not maintain contact with provider office  Does not contact provider office for questions/concerns Case Manager Clinical Goal(s):   Over the next 120 days, patient will verbalize understanding of plan for hypertension management  Over the next 120 days, patient will demonstrate improved adherence to prescribed treatment plan for hypertension as evidenced by taking all medications as prescribed, monitoring and recording blood pressure as directed, adhering to low sodium/DASH diet  Over the next 120 days, patient will demonstrate improved health management independence as evidenced by checking blood pressure as directed and notifying PCP if SBP>160 or DBP > 90, taking all medications as prescribe, and adhering to a low sodium diet as discussed.  Over the next 120 days, patient will verbalize basic understanding of hypertension disease process and self health management plan as evidenced by compliance with dietary restrictions, medication regimen and working with CCM team to optimize health and well being.  Interventions:   Collaboration with Guadalupe Maple, MD regarding development and update of comprehensive plan of care as evidenced by provider attestation and co-signature  Inter-disciplinary care team collaboration (see longitudinal plan of care)  Evaluation of current treatment plan related to hypertension self management and patient's adherence to plan as established by provider.  Provided education to patient re: stroke prevention, s/s of heart attack and stroke, DASH diet, complications of uncontrolled blood pressure  Reviewed medications with patient and discussed importance of compliance  Discussed plans with patient for ongoing care management follow up and provided patient with direct contact information for care management team  Advised patient, providing education and rationale, to monitor blood pressure  daily and record, calling PCP for findings outside established parameters.  Patient Goals/Self-Care Activities  Over the next 120 days, patient will:  - Self administers medications as prescribed Attends all scheduled provider appointments Calls provider office for new concerns, questions, or BP outside discussed parameters Checks BP and records as discussed Follows a low sodium diet/DASH diet - blood pressure trends reviewed - depression screen reviewed - home or ambulatory blood pressure monitoring encouraged Follow Up Plan: Telephone follow up appointment with care management team member scheduled for: 12-15-2020 at 2:15 pm   Task: RNCM: Identify and Monitor Blood Pressure Elevation   Note:   Care Management Activities:    - blood pressure trends reviewed - depression screen reviewed - home or ambulatory blood pressure monitoring encouraged         Plan:Telephone follow up appointment with care management team member scheduled for:  12-15-2020 at 2:15 pm  [place], MSN, [facility] Mobile: [phone]

## 2020-11-05 ENCOUNTER — Ambulatory Visit: Payer: Self-pay | Admitting: General Practice

## 2020-11-05 DIAGNOSIS — E785 Hyperlipidemia, unspecified: Secondary | ICD-10-CM | POA: Diagnosis not present

## 2020-11-05 DIAGNOSIS — I2583 Coronary atherosclerosis due to lipid rich plaque: Secondary | ICD-10-CM

## 2020-11-05 DIAGNOSIS — I2510 Atherosclerotic heart disease of native coronary artery without angina pectoris: Secondary | ICD-10-CM

## 2020-11-05 DIAGNOSIS — I10 Essential (primary) hypertension: Secondary | ICD-10-CM

## 2020-11-05 NOTE — Chronic Care Management (AMB) (Signed)
Chronic Care Management   CCM RN Visit Note  11/05/2020 Name: Ricky Melton MRN: [id number] DOB: [birth year]  Subjective: Ricky Melton is a 70 y.o. year old male who is a primary care patient of Vigg, Avanti, MD. The care management team was consulted for assistance with disease management and care coordination needs.    Engaged with patient by telephone for follow up visit in response to provider referral for case management and/or care coordination services.   Consent to Services:  The patient was given information about Chronic Care Management services, agreed to services, and gave verbal consent prior to initiation of services.  Please see initial visit note for detailed documentation.   Patient agreed to services and verbal consent obtained.   Assessment: Review of patient past medical history, allergies, medications, health status, including review of consultants reports, laboratory and other test data, was performed as part of comprehensive evaluation and provision of chronic care management services.   SDOH (Social Determinants of Health) assessments and interventions performed:    CCM Care Plan  No Known Allergies  Outpatient Encounter Medications as of 11/05/2020  Medication Sig  . allopurinol (ZYLOPRIM) 300 MG tablet Take 1 tablet (300 mg total) by mouth daily.  Marland Kitchen amLODipine (NORVASC) 10 MG tablet Take 1 tablet (10 mg total) by mouth daily.  Marland Kitchen aspirin 81 MG tablet Take 81 mg by mouth daily.  . benazepril (LOTENSIN) 40 MG tablet Take 1 tablet (40 mg total) by mouth daily.  . carvedilol (COREG) 25 MG tablet Take 1 tablet (25 mg total) by mouth 2 (two) times daily with a meal.  . chlorthalidone (HYGROTON) 25 MG tablet Take 0.5 tablets (12.5 mg total) by mouth daily.  . clopidogrel (PLAVIX) 75 MG tablet Take 1 tablet (75 mg total) by mouth daily.  Marland Kitchen levothyroxine (SYNTHROID) 50 MCG tablet TAKE 1 TABLET BY MOUTH  DAILY  . predniSONE (DELTASONE) 10 MG tablet 6 tabs today and  tomorrow, 5 tabs the next 2 days, decrease by 1 every other day until gone  . rosuvastatin (CRESTOR) 40 MG tablet Take 1 tablet (40 mg total) by mouth daily.  . valACYclovir (VALTREX) 1000 MG tablet Take 1 tablet (1,000 mg total) by mouth 2 (two) times daily.   No facility-administered encounter medications on file as of 11/05/2020.    Patient Active Problem List   Diagnosis Date Noted  . Advanced care planning/counseling discussion 10/25/2017  . Allergy 07/12/2017  . Morbid obesity ([place]) 10/19/2016  . BPH (benign prostatic hyperplasia) 10/19/2016  . 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 CAD (coronary artery disease) 03/23/2015  . Benign hematuria 03/23/2015  . Hypertension 03/23/2015  . Hyperlipidemia 03/23/2015  . Hypothyroidism 03/23/2015  . Gout 03/23/2015    Conditions to be addressed/monitored:CAD, HTN and HLD  Care Plan : RNCM: Coronary Artery Disease (Adult) and HLD  Updates made by Vanita Ingles since 11/05/2020 12:00 AM    Problem: RNCM: Disease Progression (Coronary Artery Disease)   Priority: Medium    Goal: RNCM: Disease Progression Prevented or Minimized   Priority: Medium  Note:   Current Barriers:  . Poorly controlled hyperlipidemia, complicated by ENT issue- COVID negative, HTN . Current antihyperlipidemic regimen:Crestor 40 mg  . Most recent lipid panel:     Component Value Date/Time   CHOL 106 06/24/2020 0837   CHOL 98 05/14/2019 1300   TRIG 118 06/24/2020 0837   TRIG 205 (H) 05/14/2019 1300   HDL 36 (L) 06/24/2020 0569  CHOLHDL 4.9 10/25/2017 1010   VLDL 41 (H) 05/14/2019 1300   LDLCALC 49 06/24/2020 0837 .   Marland Kitchen ASCVD risk enhancing conditions: age >13,  HTN, CKD,  former smoker . Lacks social connections . Does not contact provider office for questions/concerns  RN Care Manager Clinical Goal(s):  Marland Kitchen Over the next 120 days, patient will work with Consulting civil engineer, providers, and care team towards execution of optimized self-health  management plan . Over the next 120 days, patient will verbalize understanding of plan for effective management of HLD and CAD . Over the next 120 days, patient will work with [facility] and pcp to address needs related to changes in chronic conditions of HLD and CAD  Interventions: . Collaboration with Charlynne Cousins, MD regarding development and update of comprehensive plan of care as evidenced by provider attestation and co-signature . Inter-disciplinary care team collaboration (see longitudinal plan of care) . Medication review performed; medication list updated in electronic medical record.  Bertram Savin care team collaboration (see longitudinal plan of care) . Referred to pharmacy team for assistance with HLD medication management . Evaluation of current treatment plan related to CAD and HLD  and patient's adherence to plan as established by provider. 11-05-2020: The patient is doing well but wanted to know if the new provider was available for him to set up a new appointment for his annual physical. In basket message sent to the [facility] administrative staff for assistance with getting an appointment. . Advised patient to call the office for changes in condition or questions  . Provided education to patient re: heart healthy diet, staying safe, monitoring for new concerns  . Reviewed medications with patient and discussed compliance  . Discussed plans with patient for ongoing care management follow up and provided patient with direct contact information for care management team   Patient Goals/Self-Care Activities: . Over the next 120 days, patient will:   - call for medicine refill 2 or 3 days before it runs out - call if I am sick and can't take my medicine - keep a list of all the medicines I take; vitamins and herbals too - learn to read medicine labels - use a pillbox to sort medicine - use an alarm clock or phone to remind me to take my medicine - drink 6 to 8 glasses of water each  day - eat 3 to 5 servings of fruits and vegetables each day - eat 5 or 6 small meals each day - fill half the plate with nonstarchy vegetables - limit fast food meals to no more than 1 per week - manage portion size - prepare main meal at home 3 to 5 days each week - read food labels for fat, fiber, carbohydrates and portion size - reduce red meat to 2 to 3 times a week - be open to making changes - I can manage, know and watch for signs of a heart attack - if I have chest pain, call for help - learn about small changes that will make a big difference - learn my personal risk factors  - barriers to treatment adherence reviewed and addressed - communicable disease prevention promoted - functional limitation screening reviewed - healthy lifestyle promoted - rescue (action) plan developed - response to pharmacologic therapy monitored - self-awareness of signs/symptoms of worsening disease encouraged  Follow Up Plan: Telephone follow up appointment with care management team member scheduled for:12-15-2020 at 2:15 pm     Care Plan : RNCM: Hypertension (Adult)  Updates made by Vanita Ingles since 11/05/2020 12:00 AM    Problem: RNCM: Hypertension (Hypertension)   Priority: Medium    Goal: RNCM: Hypertension Monitored   Priority: Medium  Note:   Objective:  . Last practice recorded BP readings:  . BP Readings from Last 3 Encounters: .  06/26/20 . (!) 160/77 .  06/24/20 . 134/68 .  12/11/19 . 138/78 .    Marland Kitchen Most recent eGFR/CrCl: No results found for: EGFR  No components found for: CRCL Current Barriers:  Marland Kitchen Knowledge Deficits related to basic understanding of hypertension pathophysiology and self care management . Knowledge Deficits related to understanding of medications prescribed for management of hypertension . Limited Social Support . Lacks social connections . Does not maintain contact with provider office . Does not contact provider office for questions/concerns Case  Manager Clinical Goal(s):  Marland Kitchen Over the next 120 days, patient will verbalize understanding of plan for hypertension management . Over the next 120 days, patient will demonstrate improved adherence to prescribed treatment plan for hypertension as evidenced by taking all medications as prescribed, monitoring and recording blood pressure as directed, adhering to low sodium/DASH diet . Over the next 120 days, patient will demonstrate improved health management independence as evidenced by checking blood pressure as directed and notifying PCP if SBP>160 or DBP > 90, taking all medications as prescribe, and adhering to a low sodium diet as discussed. . Over the next 120 days, patient will verbalize basic understanding of hypertension disease process and self health management plan as evidenced by compliance with dietary restrictions, medication regimen and working with CCM team to optimize health and well being.  Interventions:  . Collaboration with Guadalupe Maple, MD regarding development and update of comprehensive plan of care as evidenced by provider attestation and co-signature . Inter-disciplinary care team collaboration (see longitudinal plan of care) . Evaluation of current treatment plan related to hypertension self management and patient's adherence to plan as established by provider. . Provided education to patient re: stroke prevention, s/s of heart attack and stroke, DASH diet, complications of uncontrolled blood pressure. 11-05-2020: Review of diet and regular activities, encouraged compliance with heart healthy diet. . Reviewed medications with patient and discussed importance of compliance . Discussed plans with patient for ongoing care management follow up and provided patient with direct contact information for care management team . Advised patient, providing education and rationale, to monitor blood pressure daily and record, calling PCP for findings outside established parameters.   Patient Goals/Self-Care Activities . Over the next 120 days, patient will:  - Self administers medications as prescribed Attends all scheduled provider appointments Calls provider office for new concerns, questions, or BP outside discussed parameters Checks BP and records as discussed Follows a low sodium diet/DASH diet - blood pressure trends reviewed - depression screen reviewed - home or ambulatory blood pressure monitoring encouraged Follow Up Plan: Telephone follow up appointment with care management team member scheduled for: 12-15-2020 at 2:15 pm     Plan:Telephone follow up appointment with care management team member scheduled for:  12-15-2020 at 43 pm  [place], MSN, [facility] Mobile: [phone]

## 2020-11-05 NOTE — Patient Instructions (Signed)
Visit Information  PATIENT GOALS: Patient Care Plan: RNCM: Coronary Artery Disease (Adult) and HLD    Problem Identified: RNCM: Disease Progression (Coronary Artery Disease)   Priority: Medium    Goal: RNCM: Disease Progression Prevented or Minimized   Priority: Medium  Note:   Current Barriers:  . Poorly controlled hyperlipidemia, complicated by ENT issue- COVID negative, HTN . Current antihyperlipidemic regimen:Crestor 40 mg  . Most recent lipid panel:     Component Value Date/Time   CHOL 106 06/24/2020 0837   CHOL 98 05/14/2019 1300   TRIG 118 06/24/2020 0837   TRIG 205 (H) 05/14/2019 1300   HDL 36 (L) 06/24/2020 0837   CHOLHDL 4.9 10/25/2017 1010   VLDL 41 (H) 05/14/2019 1300   LDLCALC 49 06/24/2020 0837 .   . ASCVD risk enhancing conditions: age >65,  HTN, CKD,  former smoker . Lacks social connections . Does not contact provider office for questions/concerns  RN Care Manager Clinical Goal(s):  . Over the next 120 days, patient will work with RN Care Manager, providers, and care team towards execution of optimized self-health management plan . Over the next 120 days, patient will verbalize understanding of plan for effective management of HLD and CAD . Over the next 120 days, patient will work with RNCM and pcp to address needs related to changes in chronic conditions of HLD and CAD  Interventions: . Collaboration with Vigg, Avanti, MD regarding development and update of comprehensive plan of care as evidenced by provider attestation and co-signature . Inter-disciplinary care team collaboration (see longitudinal plan of care) . Medication review performed; medication list updated in electronic medical record.  . Inter-disciplinary care team collaboration (see longitudinal plan of care) . Referred to pharmacy team for assistance with HLD medication management . Evaluation of current treatment plan related to CAD and HLD  and patient's adherence to plan as established by  provider. 11-05-2020: The patient is doing well but wanted to know if the new provider was available for him to set up a new appointment for his annual physical. In basket message sent to the [facility] administrative staff for assistance with getting an appointment. . Advised patient to call the office for changes in condition or questions  . Provided education to patient re: heart healthy diet, staying safe, monitoring for new concerns  . Reviewed medications with patient and discussed compliance  . Discussed plans with patient for ongoing care management follow up and provided patient with direct contact information for care management team   Patient Goals/Self-Care Activities: . Over the next 120 days, patient will:   - call for medicine refill 2 or 3 days before it runs out - call if I am sick and can't take my medicine - keep a list of all the medicines I take; vitamins and herbals too - learn to read medicine labels - use a pillbox to sort medicine - use an alarm clock or phone to remind me to take my medicine - drink 6 to 8 glasses of water each day - eat 3 to 5 servings of fruits and vegetables each day - eat 5 or 6 small meals each day - fill half the plate with nonstarchy vegetables - limit fast food meals to no more than 1 per week - manage portion size - prepare main meal at home 3 to 5 days each week - read food labels for fat, fiber, carbohydrates and portion size - reduce red meat to 2 to 3 times a week - be   open to making changes - I can manage, know and watch for signs of a heart attack - if I have chest pain, call for help - learn about small changes that will make a big difference - learn my personal risk factors  - barriers to treatment adherence reviewed and addressed - communicable disease prevention promoted - functional limitation screening reviewed - healthy lifestyle promoted - rescue (action) plan developed - response to pharmacologic therapy monitored -  self-awareness of signs/symptoms of worsening disease encouraged  Follow Up Plan: Telephone follow up appointment with care management team member scheduled for:12-15-2020 at 2:15 pm     Task: RNCM: Alleviate Barriers to Coronary Artery Disease Therapy   Note:   Care Management Activities:    - barriers to treatment adherence reviewed and addressed - communicable disease prevention promoted - functional limitation screening reviewed - healthy lifestyle promoted - rescue (action) plan developed - response to pharmacologic therapy monitored - self-awareness of signs/symptoms of worsening disease encouraged       Patient Care Plan: RNCM: Hypertension (Adult)    Problem Identified: RNCM: Hypertension (Hypertension)   Priority: Medium    Goal: RNCM: Hypertension Monitored   Priority: Medium  Note:   Objective:  . Last practice recorded BP readings:  . BP Readings from Last 3 Encounters: .  06/26/20 . (!) 160/77 .  06/24/20 . 134/68 .  12/11/19 . 138/78 .    Marland Kitchen Most recent eGFR/CrCl: No results found for: EGFR  No components found for: CRCL Current Barriers:  Marland Kitchen Knowledge Deficits related to basic understanding of hypertension pathophysiology and self care management . Knowledge Deficits related to understanding of medications prescribed for management of hypertension . Limited Social Support . Lacks social connections . Does not maintain contact with provider office . Does not contact provider office for questions/concerns Case Manager Clinical Goal(s):  Marland Kitchen Over the next 120 days, patient will verbalize understanding of plan for hypertension management . Over the next 120 days, patient will demonstrate improved adherence to prescribed treatment plan for hypertension as evidenced by taking all medications as prescribed, monitoring and recording blood pressure as directed, adhering to low sodium/DASH diet . Over the next 120 days, patient will demonstrate improved health management  independence as evidenced by checking blood pressure as directed and notifying PCP if SBP>160 or DBP > 90, taking all medications as prescribe, and adhering to a low sodium diet as discussed. . Over the next 120 days, patient will verbalize basic understanding of hypertension disease process and self health management plan as evidenced by compliance with dietary restrictions, medication regimen and working with CCM team to optimize health and well being.  Interventions:  . Collaboration with Guadalupe Maple, MD regarding development and update of comprehensive plan of care as evidenced by provider attestation and co-signature . Inter-disciplinary care team collaboration (see longitudinal plan of care) . Evaluation of current treatment plan related to hypertension self management and patient's adherence to plan as established by provider. . Provided education to patient re: stroke prevention, s/s of heart attack and stroke, DASH diet, complications of uncontrolled blood pressure. 11-05-2020: Review of diet and regular activities, encouraged compliance with heart healthy diet. . Reviewed medications with patient and discussed importance of compliance . Discussed plans with patient for ongoing care management follow up and provided patient with direct contact information for care management team . Advised patient, providing education and rationale, to monitor blood pressure daily and record, calling PCP for findings outside established parameters.  Patient  Goals/Self-Care Activities . Over the next 120 days, patient will:  - Self administers medications as prescribed Attends all scheduled provider appointments Calls provider office for new concerns, questions, or BP outside discussed parameters Checks BP and records as discussed Follows a low sodium diet/DASH diet - blood pressure trends reviewed - depression screen reviewed - home or ambulatory blood pressure monitoring encouraged Follow Up Plan:  Telephone follow up appointment with care management team member scheduled for: 12-15-2020 at 2:15 pm   Task: RNCM: Identify and Monitor Blood Pressure Elevation   Note:   Care Management Activities:    - blood pressure trends reviewed - depression screen reviewed - home or ambulatory blood pressure monitoring encouraged         The patient verbalized understanding of instructions, educational materials, and care plan provided today and declined offer to receive copy of patient instructions, educational materials, and care plan.   Telephone follow up appointment with care management team member scheduled for: 12-15-2020 at 215 pm  Noreene Larsson RN, MSN, [facility] Mobile: [phone]

## 2020-11-08 ENCOUNTER — Telehealth: Payer: Self-pay

## 2020-11-08 NOTE — Telephone Encounter (Signed)
-----   Message from Vanita Ingles sent at 11/05/2020 10:16 AM EST ----- Regarding: The patient would like a visit with new pcp for annual physical The patient called and ask if the new pcp was in. He would like to have an early am appointment set up with Dr. Neomia Dear to have his annual physical. Please call and help him get scheduled. Thanks, Pam

## 2020-11-08 NOTE — Telephone Encounter (Signed)
LVM TO SCHEDULE;E APT/

## 2020-11-09 NOTE — Telephone Encounter (Signed)
Pt is calling back to sch with dr Neomia Dear for physical

## 2020-11-25 ENCOUNTER — Ambulatory Visit: Payer: Medicare Other

## 2020-12-03 ENCOUNTER — Ambulatory Visit: Payer: Medicare Other

## 2020-12-06 ENCOUNTER — Telehealth: Payer: Self-pay | Admitting: Internal Medicine

## 2020-12-06 NOTE — Telephone Encounter (Signed)
Copied from [place] [id number]. Topic: Medicare AWV >> Dec 06, 2020  2:10 PM Weston Anna wrote: Reason for CRM:  Tried contacting patient with new AWVS that is scheduled December 20, 2020 @ 1:00pm to be completed by phone not in the office -srs

## 2020-12-10 ENCOUNTER — Other Ambulatory Visit: Payer: Self-pay | Admitting: Family Medicine

## 2020-12-10 DIAGNOSIS — M1000 Idiopathic gout, unspecified site: Secondary | ICD-10-CM

## 2020-12-15 ENCOUNTER — Ambulatory Visit (INDEPENDENT_AMBULATORY_CARE_PROVIDER_SITE_OTHER): Payer: Medicare Other | Admitting: General Practice

## 2020-12-15 ENCOUNTER — Encounter: Payer: Self-pay | Admitting: Internal Medicine

## 2020-12-15 ENCOUNTER — Telehealth: Payer: Self-pay | Admitting: General Practice

## 2020-12-15 ENCOUNTER — Ambulatory Visit (INDEPENDENT_AMBULATORY_CARE_PROVIDER_SITE_OTHER): Payer: Medicare Other | Admitting: Internal Medicine

## 2020-12-15 ENCOUNTER — Other Ambulatory Visit: Payer: Self-pay

## 2020-12-15 VITALS — BP 140/80 | HR 66 | Temp 98.7°F | Ht 67.24 in | Wt 252.4 lb

## 2020-12-15 DIAGNOSIS — K219 Gastro-esophageal reflux disease without esophagitis: Secondary | ICD-10-CM | POA: Diagnosis not present

## 2020-12-15 DIAGNOSIS — I10 Essential (primary) hypertension: Secondary | ICD-10-CM

## 2020-12-15 DIAGNOSIS — M1000 Idiopathic gout, unspecified site: Secondary | ICD-10-CM

## 2020-12-15 DIAGNOSIS — Z125 Encounter for screening for malignant neoplasm of prostate: Secondary | ICD-10-CM | POA: Diagnosis not present

## 2020-12-15 DIAGNOSIS — J440 Chronic obstructive pulmonary disease with acute lower respiratory infection: Secondary | ICD-10-CM | POA: Diagnosis not present

## 2020-12-15 DIAGNOSIS — E785 Hyperlipidemia, unspecified: Secondary | ICD-10-CM | POA: Diagnosis not present

## 2020-12-15 DIAGNOSIS — I2510 Atherosclerotic heart disease of native coronary artery without angina pectoris: Secondary | ICD-10-CM

## 2020-12-15 DIAGNOSIS — I2583 Coronary atherosclerosis due to lipid rich plaque: Secondary | ICD-10-CM

## 2020-12-15 DIAGNOSIS — E039 Hypothyroidism, unspecified: Secondary | ICD-10-CM | POA: Diagnosis not present

## 2020-12-15 DIAGNOSIS — J209 Acute bronchitis, unspecified: Secondary | ICD-10-CM | POA: Diagnosis not present

## 2020-12-15 LAB — URINALYSIS, ROUTINE W REFLEX MICROSCOPIC
Bilirubin, UA: NEGATIVE
Glucose, UA: NEGATIVE
Ketones, UA: NEGATIVE
Leukocytes,UA: NEGATIVE
Nitrite, UA: NEGATIVE
Specific Gravity, UA: 1.015 (ref 1.005–1.030)
Urobilinogen, Ur: 0.2 mg/dL (ref 0.2–1.0)
pH, UA: 5.5 (ref 5.0–7.5)

## 2020-12-15 LAB — MICROSCOPIC EXAMINATION
Bacteria, UA: NONE SEEN
Epithelial Cells (non renal): NONE SEEN /hpf (ref 0–10)
WBC, UA: NONE SEEN /hpf (ref 0–5)

## 2020-12-15 MED ORDER — AMLODIPINE BESYLATE 10 MG PO TABS: 10.0000 mg | ORAL_TABLET | Freq: Every day | ORAL | 1 refills | Status: DC

## 2020-12-15 MED ORDER — CLOPIDOGREL BISULFATE 75 MG PO TABS
75.0000 mg | ORAL_TABLET | Freq: Every day | ORAL | 1 refills | Status: DC
Start: 2020-12-15 — End: 2021-06-27

## 2020-12-15 MED ORDER — ALLOPURINOL 300 MG PO TABS: 300.0000 mg | ORAL_TABLET | Freq: Every day | ORAL | 0 refills | Status: DC

## 2020-12-15 MED ORDER — BENAZEPRIL HCL 40 MG PO TABS: 40.0000 mg | ORAL_TABLET | Freq: Every day | ORAL | 1 refills | Status: DC

## 2020-12-15 MED ORDER — ROSUVASTATIN CALCIUM 40 MG PO TABS: 40.0000 mg | ORAL_TABLET | Freq: Every day | ORAL | 1 refills | Status: DC

## 2020-12-15 NOTE — Progress Notes (Signed)
BP 140/80   Pulse 66   Temp 98.7 F (37.1 C) (Oral)   Ht 5' 7.24" (1.708 m)   Wt 252 lb 6.4 oz (114.5 kg)   SpO2 98%   BMI 39.25 kg/m    Subjective:    Patient ID: Ricky Melton, male    DOB: May 17, 1951, 70 y.o.   MRN: [id number]  HPI: Ricky Melton is a 70 y.o. male presenting on 12/15/2020 for comprehensive medical examination. Current medical complaints include:none  He currently lives with: Interim Problems from his last visit: no  Depression Screen done today and results listed below:  Depression screen [facility] 2/9 12/15/2020 02/10/2020 12/11/2019 11/19/2019 11/14/2018  Decreased Interest 0 0 0 0 0  Down, Depressed, Hopeless 0 0 0 0 0  PHQ - 2 Score 0 0 0 0 0  Altered sleeping 0 - 0 - -  Tired, decreased energy 0 - 0 - -  Change in appetite 0 - 0 - -  Feeling bad or failure about yourself  0 - 0 - -  Trouble concentrating 0 - 0 - -  Moving slowly or fidgety/restless 0 - 0 - -  Suicidal thoughts 0 - 0 - -  PHQ-9 Score 0 - 0 - -  Difficult doing work/chores - - - - -     Past Medical History:  Past Medical History:  Diagnosis Date  . Benign hematuria   . CAD (coronary artery disease)   . Chronic kidney disease   . Hyperlipidemia   . Hypertension     Surgical History:  Past Surgical History:  Procedure Laterality Date  . cardiac stents     x2  . COLONOSCOPY WITH PROPOFOL N/A 05/10/2018   Procedure: COLONOSCOPY WITH PROPOFOL;  Surgeon: Jonathon Bellows, MD;  Location: [facility] ENDOSCOPY;  Service: Gastroenterology;  Laterality: N/A;  . EYE SURGERY Right 2021   cataract sx in [place]     Medications:  Current Outpatient Medications on File Prior to Visit  Medication Sig  . albuterol (VENTOLIN HFA) 108 (90 Base) MCG/ACT inhaler   . aspirin 81 MG tablet Take 81 mg by mouth daily.  . carvedilol (COREG) 25 MG tablet Take 1 tablet (25 mg total) by mouth 2 (two) times daily with a meal.  . chlorthalidone (HYGROTON) 25 MG tablet Take 0.5 tablets (12.5 mg total) by mouth daily.   . pantoprazole (PROTONIX) 40 MG tablet Take 1 tablet by mouth daily.  . valACYclovir (VALTREX) 1000 MG tablet Take 1 tablet (1,000 mg total) by mouth 2 (two) times daily.  Marland Kitchen levothyroxine (SYNTHROID) 50 MCG tablet TAKE 1 TABLET BY MOUTH  DAILY   No current facility-administered medications on file prior to visit.    Allergies:  No Known Allergies  Social History:  Social History   Socioeconomic History  . Marital status: Married    Spouse name: Not on file  . Number of children: Not on file  . Years of education: Not on file  . Highest education level: High school graduate  Occupational History  . Occupation: maintenence     Comment: part time   Tobacco Use  . Smoking status: Never Smoker  . Smokeless tobacco: Former Systems developer    Types: Secondary school teacher  . Vaping Use: Never used  Substance and Sexual Activity  . Alcohol use: No    Alcohol/week: 0.0 standard drinks  . Drug use: No  . Sexual activity: Yes  Other Topics Concern  . Not on file  Social  History Narrative   Plays golf with friends, mows yards    Social Determinants of Health   Financial Resource Strain: Low Risk   . Difficulty of Paying Living Expenses: Not hard at all  Food Insecurity: No Food Insecurity  . Worried About Charity fundraiser in the Last Year: Never true  . Ran Out of Food in the Last Year: Never true  Transportation Needs: No Transportation Needs  . Lack of Transportation (Medical): No  . Lack of Transportation (Non-Medical): No  Physical Activity: Insufficiently Active  . Days of Exercise per Week: 4 days  . Minutes of Exercise per Session: 30 min  Stress: No Stress Concern Present  . Feeling of Stress : Not at all  Social Connections: Socially Integrated  . Frequency of Communication with Friends and Family: More than three times a week  . Frequency of Social Gatherings with Friends and Family: More than three times a week  . Attends Religious Services: More than 4 times per year  .  Active Member of Clubs or Organizations: Yes  . Attends Archivist Meetings: More than 4 times per year  . Marital Status: Married  Human resources officer Violence: Not At Risk  . Fear of Current or Ex-Partner: No  . Emotionally Abused: No  . Physically Abused: No  . Sexually Abused: No   Social History   Tobacco Use  Smoking Status Never Smoker  Smokeless Tobacco Former Systems developer  . Types: Chew   Social History   Substance and Sexual Activity  Alcohol Use No  . Alcohol/week: 0.0 standard drinks    Family History:  Family History  Problem Relation Age of Onset  . Hypertension Mother   . Diabetes Sister   . Diabetes Son     Past medical history, surgical history, medications, allergies, family history and social history reviewed with patient today and changes made to appropriate areas of the chart.   Review of Systems  Constitutional: Negative.   HENT: Negative for hearing loss.   Respiratory: Negative for cough, hemoptysis and sputum production.   Cardiovascular: Negative for chest pain, palpitations, orthopnea, leg swelling and PND.  Gastrointestinal: Negative for heartburn, nausea and vomiting.  Genitourinary: Negative for dysuria, frequency and urgency.  Musculoskeletal: Negative for myalgias and neck pain.  Skin: Negative.   Neurological: Negative for dizziness, sensory change, seizures, weakness and headaches.  Psychiatric/Behavioral: Negative for depression.   All other ROS negative except what is listed above and in the HPI.      Objective:    BP 140/80   Pulse 66   Temp 98.7 F (37.1 C) (Oral)   Ht 5' 7.24" (1.708 m)   Wt 252 lb 6.4 oz (114.5 kg)   SpO2 98%   BMI 39.25 kg/m   Wt Readings from Last 3 Encounters:  12/15/20 252 lb 6.4 oz (114.5 kg)  06/26/20 250 lb (113.4 kg)  06/24/20 257 lb (116.6 kg)    Physical Exam Vitals and nursing note reviewed.  Constitutional:      General: He is not in acute distress.    Appearance: Normal appearance.  He is not ill-appearing or diaphoretic.  HENT:     Head: Normocephalic and atraumatic.     Right Ear: Tympanic membrane and external ear normal. There is no impacted cerumen.     Left Ear: External ear normal.     Nose: No congestion or rhinorrhea.     Mouth/Throat:     Pharynx: No oropharyngeal exudate or posterior  oropharyngeal erythema.  Eyes:     Conjunctiva/sclera: Conjunctivae normal.     Pupils: Pupils are equal, round, and reactive to light.  Cardiovascular:     Rate and Rhythm: Normal rate and regular rhythm.     Heart sounds: No murmur heard. No friction rub. No gallop.   Pulmonary:     Effort: No respiratory distress.     Breath sounds: No stridor. No wheezing or rhonchi.  Chest:     Chest wall: No tenderness.  Abdominal:     General: Abdomen is flat. Bowel sounds are normal. There is no distension.     Palpations: Abdomen is soft. There is no mass.     Tenderness: There is no abdominal tenderness. There is no guarding.  Musculoskeletal:        General: No swelling or deformity.     Cervical back: Normal range of motion and neck supple. No rigidity or tenderness.     Right lower leg: No edema.     Left lower leg: No edema.  Skin:    General: Skin is warm and dry.     Coloration: Skin is not jaundiced.     Findings: No erythema.  Neurological:     Mental Status: He is alert and oriented to person, place, and time. Mental status is at baseline.  Psychiatric:        Mood and Affect: Mood normal.        Behavior: Behavior normal.        Thought Content: Thought content normal.        Judgment: Judgment normal.     Results for orders placed or performed in visit on 09/14/20  Novel Coronavirus, NAA ([facility])   Specimen: Nasopharyngeal(NP) swabs in vial transport medium  Result Value Ref Range   SARS-CoV-2, NAA Not Detected Not Detected  SARS-COV-2, NAA 2 DAY TAT  Result Value Ref Range   SARS-CoV-2, NAA 2 DAY TAT Performed       Assessment & Plan:  Physical  within normal limits we will check labs will check CMP, FLP, CBC,TSH, PSA.  Problem List Items Addressed This Visit      Endocrine   Hypothyroidism   Relevant Orders   TSH     Other   Hyperlipidemia   Relevant Medications   benazepril (LOTENSIN) 40 MG tablet   rosuvastatin (CRESTOR) 40 MG tablet   amLODipine (NORVASC) 10 MG tablet   Other Relevant Orders   Lipid panel   CBC with Differential/Platelet   Comprehensive metabolic panel   Gout   Relevant Medications   allopurinol (ZYLOPRIM) 300 MG tablet    Other Visit Diagnoses    Screening for malignant neoplasm of prostate    -  Primary   Relevant Orders   PSA   Essential hypertension       Relevant Medications   benazepril (LOTENSIN) 40 MG tablet   rosuvastatin (CRESTOR) 40 MG tablet   amLODipine (NORVASC) 10 MG tablet   Other Relevant Orders   Urinalysis, Routine w reflex microscopic        LABORATORY TESTING:  Health maintenance labs ordered today as discussed above.   The natural history of prostate cancer and ongoing controversy regarding screening and potential treatment outcomes of prostate cancer has been discussed with the patient. The meaning of a false positive PSA and a false negative PSA has been discussed. He indicates understanding of the limitations of this screening test and wishes  to proceed with screening PSA testing.  IMMUNIZATIONS:   Healht Maintenence :  PSA : 1.2 - 2021 Cscope : 2019 Pneumonia vaccine :2019 FLu vaccine :2021     PATIENT COUNSELING:    Discussed cessation/primary prevention of drug use and availability of treatment for abuse.   Diet: Encouraged to adjust caloric intake to maintain  or achieve ideal body weight, to reduce intake of dietary saturated fat and total fat, to limit sodium intake by avoiding high sodium foods and not adding table salt, and to maintain adequate dietary potassium and calcium preferably from fresh fruits, vegetables, and low-fat dairy products.     stressed the importance of regular exercise  Injury prevention: Discussed safety belts, safety helmets, smoke detector, smoking near bedding or upholstery.   Dental health: Discussed importance of regular tooth brushing, flossing, and dental visits.   Follow up plan: NEXT PREVENTATIVE PHYSICAL DUE IN 1 YEAR. No follow-ups on file.

## 2020-12-15 NOTE — Patient Instructions (Signed)
Visit Information  PATIENT GOALS: Patient Care Plan: RNCM: Coronary Artery Disease (Adult) and HLD    Problem Identified: RNCM: Disease Progression (Coronary Artery Disease)   Priority: Medium    Goal: RNCM: Disease Progression Prevented or Minimized   Priority: Medium  Note:   Current Barriers:  . Poorly controlled hyperlipidemia, complicated by ENT issue- COVID negative, HTN . Current antihyperlipidemic regimen:Crestor 40 mg  . Most recent lipid panel:     Component Value Date/Time   CHOL 106 06/24/2020 0837   CHOL 98 05/14/2019 1300   TRIG 118 06/24/2020 0837   TRIG 205 (H) 05/14/2019 1300   HDL 36 (L) 06/24/2020 0837   CHOLHDL 4.9 10/25/2017 1010   VLDL 41 (H) 05/14/2019 1300   LDLCALC 49 06/24/2020 0837 .   Marland Kitchen ASCVD risk enhancing conditions: age >89,  HTN, CKD,  former smoker . Lacks social connections . Does not contact provider office for questions/concerns  RN Care Manager Clinical Goal(s):  Marland Kitchen Over the next 120 days, patient will work with Consulting civil engineer, providers, and care team towards execution of optimized self-health management plan . Over the next 120 days, patient will verbalize understanding of plan for effective management of HLD and CAD . Over the next 120 days, patient will work with [facility] and pcp to address needs related to changes in chronic conditions of HLD and CAD  Interventions: . Collaboration with Charlynne Cousins, MD regarding development and update of comprehensive plan of care as evidenced by provider attestation and co-signature . Inter-disciplinary care team collaboration (see longitudinal plan of care) . Medication review performed; medication list updated in electronic medical record.  Bertram Savin care team collaboration (see longitudinal plan of care) . Referred to pharmacy team for assistance with HLD medication management . Evaluation of current treatment plan related to CAD and HLD  and patient's adherence to plan as established by  provider. 11-05-2020: The patient is doing well but wanted to know if the new provider was available for him to set up a new appointment for his annual physical. In basket message sent to the [facility] administrative staff for assistance with getting an appointment. 12-15-2020: The patient saw pcp today and was pleased with his visit. Says that he is waiting for lab work. The patient feels good about the new pcp, but is displeased with getting in touch wit the office. Emotional support given. Will continue to monitor.  . Advised patient to call the office for changes in condition or questions  . Provided education to patient re: heart healthy diet, staying safe, monitoring for new concerns  . Reviewed medications with patient and discussed compliance  . Discussed plans with patient for ongoing care management follow up and provided patient with direct contact information for care management team   Patient Goals/Self-Care Activities: . Over the next 120 days, patient will:   - call for medicine refill 2 or 3 days before it runs out - call if I am sick and can't take my medicine - keep a list of all the medicines I take; vitamins and herbals too - learn to read medicine labels - use a pillbox to sort medicine - use an alarm clock or phone to remind me to take my medicine - drink 6 to 8 glasses of water each day - eat 3 to 5 servings of fruits and vegetables each day - eat 5 or 6 small meals each day - fill half the plate with nonstarchy vegetables - limit fast food meals to  no more than 1 per week - manage portion size - prepare main meal at home 3 to 5 days each week - read food labels for fat, fiber, carbohydrates and portion size - reduce red meat to 2 to 3 times a week - be open to making changes - I can manage, know and watch for signs of a heart attack - if I have chest pain, call for help - learn about small changes that will make a big difference - learn my personal risk factors  -  barriers to treatment adherence reviewed and addressed - communicable disease prevention promoted - functional limitation screening reviewed - healthy lifestyle promoted - rescue (action) plan developed - response to pharmacologic therapy monitored - self-awareness of signs/symptoms of worsening disease encouraged  Follow Up Plan: Telephone follow up appointment with care management team member scheduled for:03-02-2021 at 2:30 pm     Task: RNCM: Alleviate Barriers to Coronary Artery Disease Therapy   Note:   Care Management Activities:    - barriers to treatment adherence reviewed and addressed - communicable disease prevention promoted - functional limitation screening reviewed - healthy lifestyle promoted - rescue (action) plan developed - response to pharmacologic therapy monitored - self-awareness of signs/symptoms of worsening disease encouraged       Patient Care Plan: RNCM: Hypertension (Adult)    Problem Identified: RNCM: Hypertension (Hypertension)   Priority: Medium    Goal: RNCM: Hypertension Monitored   Priority: Medium  Note:   Objective:  . Last practice recorded BP readings:  . BP Readings from Last 3 Encounters: .  12/15/20 . 140/80 .  06/26/20 . (!) 160/77 .  06/24/20 . 134/68 .     Marland Kitchen Most recent eGFR/CrCl: No results found for: EGFR  No components found for: CRCL Current Barriers:  Marland Kitchen Knowledge Deficits related to basic understanding of hypertension pathophysiology and self care management . Knowledge Deficits related to understanding of medications prescribed for management of hypertension . Limited Social Support . Lacks social connections . Does not maintain contact with provider office . Does not contact provider office for questions/concerns Case Manager Clinical Goal(s):  Marland Kitchen Over the next 120 days, patient will verbalize understanding of plan for hypertension management . Over the next 120 days, patient will demonstrate improved adherence to  prescribed treatment plan for hypertension as evidenced by taking all medications as prescribed, monitoring and recording blood pressure as directed, adhering to low sodium/DASH diet . Over the next 120 days, patient will demonstrate improved health management independence as evidenced by checking blood pressure as directed and notifying PCP if SBP>160 or DBP > 90, taking all medications as prescribe, and adhering to a low sodium diet as discussed. . Over the next 120 days, patient will verbalize basic understanding of hypertension disease process and self health management plan as evidenced by compliance with dietary restrictions, medication regimen and working with CCM team to optimize health and well being.  Interventions:  . Collaboration with Loura Pardon, MD regarding development and update of comprehensive plan of care as evidenced by provider attestation and co-signature . Inter-disciplinary care team collaboration (see longitudinal plan of care) . Evaluation of current treatment plan related to hypertension self management and patient's adherence to plan as established by provider. 12-15-2020: Saw the provider today and is pleased with the new pcp.  Is compliant with medications and plan of care.  . Provided education to patient re: stroke prevention, s/s of heart attack and stroke, DASH diet, complications of uncontrolled blood pressure.  12-15-2020: Review of diet and regular activities, encouraged compliance with heart healthy diet. . Reviewed medications with patient and discussed importance of compliance. 12-15-2020: Is compliant with medications  . Discussed plans with patient for ongoing care management follow up and provided patient with direct contact information for care management team . Advised patient, providing education and rationale, to monitor blood pressure daily and record, calling PCP for findings outside established parameters.  Patient Goals/Self-Care Activities . Over the next  120 days, patient will:  - Self administers medications as prescribed Attends all scheduled provider appointments Calls provider office for new concerns, questions, or BP outside discussed parameters Checks BP and records as discussed Follows a low sodium diet/DASH diet - blood pressure trends reviewed - depression screen reviewed - home or ambulatory blood pressure monitoring encouraged Follow Up Plan: Telephone follow up appointment with care management team member scheduled for: 03-02-2021 at 230 pm   Task: RNCM: Identify and Monitor Blood Pressure Elevation   Note:   Care Management Activities:    - blood pressure trends reviewed - depression screen reviewed - home or ambulatory blood pressure monitoring encouraged         The patient verbalized understanding of instructions, educational materials, and care plan provided today and declined offer to receive copy of patient instructions, educational materials, and care plan.   Telephone follow up appointment with care management team member scheduled for: 03-02-2021 at 0230 pm   Bertha, MSN, [facility] Mobile: [phone]

## 2020-12-15 NOTE — Chronic Care Management (AMB) (Signed)
Chronic Care Management   CCM RN Visit Note  12/15/2020 Name: Ricky Melton MRN: [id number] DOB: 03/19/1951  Subjective: Ricky Melton is a 70 y.o. year old male who is a primary care patient of Vigg, Avanti, MD. The care management team was consulted for assistance with disease management and care coordination needs.    Engaged with patient by telephone for follow up visit in response to provider referral for case management and/or care coordination services.   Consent to Services:  The patient was given information about Chronic Care Management services, agreed to services, and gave verbal consent prior to initiation of services.  Please see initial visit note for detailed documentation.   Patient agreed to services and verbal consent obtained.   Assessment: Review of patient past medical history, allergies, medications, health status, including review of consultants reports, laboratory and other test data, was performed as part of comprehensive evaluation and provision of chronic care management services.   SDOH (Social Determinants of Health) assessments and interventions performed:    CCM Care Plan  No Known Allergies  Outpatient Encounter Medications as of 12/15/2020  Medication Sig  . albuterol (VENTOLIN HFA) 108 (90 Base) MCG/ACT inhaler   . allopurinol (ZYLOPRIM) 300 MG tablet Take 1 tablet (300 mg total) by mouth daily.  . amLODipine (NORVASC) 10 MG tablet Take 1 tablet (10 mg total) by mouth daily.  . aspirin 81 MG tablet Take 81 mg by mouth daily.  . benazepril (LOTENSIN) 40 MG tablet Take 1 tablet (40 mg total) by mouth daily.  . carvedilol (COREG) 25 MG tablet Take 1 tablet (25 mg total) by mouth 2 (two) times daily with a meal.  . chlorthalidone (HYGROTON) 25 MG tablet Take 0.5 tablets (12.5 mg total) by mouth daily.  . clopidogrel (PLAVIX) 75 MG tablet Take 1 tablet (75 mg total) by mouth daily.  . levothyroxine (SYNTHROID) 50 MCG tablet TAKE 1 TABLET BY MOUTH  DAILY   . pantoprazole (PROTONIX) 40 MG tablet Take 1 tablet by mouth daily.  . rosuvastatin (CRESTOR) 40 MG tablet Take 1 tablet (40 mg total) by mouth daily.  . valACYclovir (VALTREX) 1000 MG tablet Take 1 tablet (1,000 mg total) by mouth 2 (two) times daily.   No facility-administered encounter medications on file as of 12/15/2020.    Patient Active Problem List   Diagnosis Date Noted  . Advanced care planning/counseling discussion 10/25/2017  . Allergy 07/12/2017  . Morbid obesity (HCC) 10/19/2016  . BPH (benign prostatic hyperplasia) 10/19/2016  . 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 CAD (coronary artery disease) 03/23/2015  . Benign hematuria 03/23/2015  . Hypertension 03/23/2015  . Hyperlipidemia 03/23/2015  . Hypothyroidism 03/23/2015  . Gout 03/23/2015    Conditions to be addressed/monitored:CAD, HTN and HLD  Care Plan : RNCM: Coronary Artery Disease (Adult) and HLD  Updates made by Tate, Pamela J since 12/15/2020 12:00 AM    Problem: RNCM: Disease Progression (Coronary Artery Disease)   Priority: Medium    Goal: RNCM: Disease Progression Prevented or Minimized   Priority: Medium  Note:   Current Barriers:  . Poorly controlled hyperlipidemia, complicated by ENT issue- COVID negative, HTN . Current antihyperlipidemic regimen:Crestor 40 mg  . Most recent lipid panel:     Component Value Date/Time   CHOL 106 06/24/2020 0837   CHOL 98 05/14/2019 1300   TRIG 118 06/24/2020 0837   TRIG 205 (H) 05/14/2019 1300   HDL 36 (L) 06/24/2020 0837   CHOLHDL 4.9   10/25/2017 1010   VLDL 41 (H) 05/14/2019 1300   LDLCALC 49 06/24/2020 0837 .   Marland Kitchen ASCVD risk enhancing conditions: age >27,  HTN, CKD,  former smoker . Lacks social connections . Does not contact provider office for questions/concerns  RN Care Manager Clinical Goal(s):  Marland Kitchen Over the next 120 days, patient will work with Consulting civil engineer, providers, and care team towards execution of optimized self-health  management plan . Over the next 120 days, patient will verbalize understanding of plan for effective management of HLD and CAD . Over the next 120 days, patient will work with [facility] and pcp to address needs related to changes in chronic conditions of HLD and CAD  Interventions: . Collaboration with Charlynne Cousins, MD regarding development and update of comprehensive plan of care as evidenced by provider attestation and co-signature . Inter-disciplinary care team collaboration (see longitudinal plan of care) . Medication review performed; medication list updated in electronic medical record.  Bertram Savin care team collaboration (see longitudinal plan of care) . Referred to pharmacy team for assistance with HLD medication management . Evaluation of current treatment plan related to CAD and HLD  and patient's adherence to plan as established by provider. 11-05-2020: The patient is doing well but wanted to know if the new provider was available for him to set up a new appointment for his annual physical. In basket message sent to the [facility] administrative staff for assistance with getting an appointment. 12-15-2020: The patient saw pcp today and was pleased with his visit. Says that he is waiting for lab work. The patient feels good about the new pcp, but is displeased with getting in touch wit the office. Emotional support given. Will continue to monitor.  . Advised patient to call the office for changes in condition or questions  . Provided education to patient re: heart healthy diet, staying safe, monitoring for new concerns  . Reviewed medications with patient and discussed compliance  . Discussed plans with patient for ongoing care management follow up and provided patient with direct contact information for care management team   Patient Goals/Self-Care Activities: . Over the next 120 days, patient will:   - call for medicine refill 2 or 3 days before it runs out - call if I am sick and can't  take my medicine - keep a list of all the medicines I take; vitamins and herbals too - learn to read medicine labels - use a pillbox to sort medicine - use an alarm clock or phone to remind me to take my medicine - drink 6 to 8 glasses of water each day - eat 3 to 5 servings of fruits and vegetables each day - eat 5 or 6 small meals each day - fill half the plate with nonstarchy vegetables - limit fast food meals to no more than 1 per week - manage portion size - prepare main meal at home 3 to 5 days each week - read food labels for fat, fiber, carbohydrates and portion size - reduce red meat to 2 to 3 times a week - be open to making changes - I can manage, know and watch for signs of a heart attack - if I have chest pain, call for help - learn about small changes that will make a big difference - learn my personal risk factors  - barriers to treatment adherence reviewed and addressed - communicable disease prevention promoted - functional limitation screening reviewed - healthy lifestyle promoted - rescue (action) plan  developed - response to pharmacologic therapy monitored - self-awareness of signs/symptoms of worsening disease encouraged  Follow Up Plan: Telephone follow up appointment with care management team member scheduled for:03-02-2021 at 2:30 pm     Care Plan : RNCM: Hypertension (Adult)  Updates made by Tate, Pamela J since 12/15/2020 12:00 AM    Problem: RNCM: Hypertension (Hypertension)   Priority: Medium    Goal: RNCM: Hypertension Monitored   Priority: Medium  Note:   Objective:  . Last practice recorded BP readings:  . BP Readings from Last 3 Encounters: .  12/15/20 . 140/80 .  06/26/20 . (!) 160/77 .  06/24/20 . 134/68 .     . Most recent eGFR/CrCl: No results found for: EGFR  No components found for: CRCL Current Barriers:  . Knowledge Deficits related to basic understanding of hypertension pathophysiology and self care management . Knowledge  Deficits related to understanding of medications prescribed for management of hypertension . Limited Social Support . Lacks social connections . Does not maintain contact with provider office . Does not contact provider office for questions/concerns Case Manager Clinical Goal(s):  . Over the next 120 days, patient will verbalize understanding of plan for hypertension management . Over the next 120 days, patient will demonstrate improved adherence to prescribed treatment plan for hypertension as evidenced by taking all medications as prescribed, monitoring and recording blood pressure as directed, adhering to low sodium/DASH diet . Over the next 120 days, patient will demonstrate improved health management independence as evidenced by checking blood pressure as directed and notifying PCP if SBP>160 or DBP > 90, taking all medications as prescribe, and adhering to a low sodium diet as discussed. . Over the next 120 days, patient will verbalize basic understanding of hypertension disease process and self health management plan as evidenced by compliance with dietary restrictions, medication regimen and working with CCM team to optimize health and well being.  Interventions:  . Collaboration with Vigg, Avanti, MD regarding development and update of comprehensive plan of care as evidenced by provider attestation and co-signature . Inter-disciplinary care team collaboration (see longitudinal plan of care) . Evaluation of current treatment plan related to hypertension self management and patient's adherence to plan as established by provider. 12-15-2020: Saw the provider today and is pleased with the new pcp.  Is compliant with medications and plan of care.  . Provided education to patient re: stroke prevention, s/s of heart attack and stroke, DASH diet, complications of uncontrolled blood pressure. 12-15-2020: Review of diet and regular activities, encouraged compliance with heart healthy diet. . Reviewed  medications with patient and discussed importance of compliance. 12-15-2020: Is compliant with medications  . Discussed plans with patient for ongoing care management follow up and provided patient with direct contact information for care management team . Advised patient, providing education and rationale, to monitor blood pressure daily and record, calling PCP for findings outside established parameters.  Patient Goals/Self-Care Activities . Over the next 120 days, patient will:  - Self administers medications as prescribed Attends all scheduled provider appointments Calls provider office for new concerns, questions, or BP outside discussed parameters Checks BP and records as discussed Follows a low sodium diet/DASH diet - blood pressure trends reviewed - depression screen reviewed - home or ambulatory blood pressure monitoring encouraged Follow Up Plan: Telephone follow up appointment with care management team member scheduled for: 03-02-2021 at 230 pm     Plan:Telephone follow up appointment with care management team member scheduled for:  03-02-2021 at   230 pm  Pam Tate RN, MSN, CCM Community Care Coordinator [place]  [facility] Mobile: [phone]        

## 2020-12-16 LAB — COMPREHENSIVE METABOLIC PANEL
ALT: 17 IU/L (ref 0–44)
AST: 20 IU/L (ref 0–40)
Albumin/Globulin Ratio: 1.6 (ref 1.2–2.2)
Albumin: 4.5 g/dL (ref 3.8–4.8)
Alkaline Phosphatase: 68 IU/L (ref 44–121)
BUN/Creatinine Ratio: 15 (ref 10–24)
BUN: 24 mg/dL (ref 8–27)
Bilirubin Total: 0.4 mg/dL (ref 0.0–1.2)
CO2: 19 mmol/L — ABNORMAL LOW (ref 20–29)
Calcium: 9.8 mg/dL (ref 8.6–10.2)
Chloride: 105 mmol/L (ref 96–106)
Creatinine, Ser: 1.63 mg/dL — ABNORMAL HIGH (ref 0.76–1.27)
Globulin, Total: 2.8 g/dL (ref 1.5–4.5)
Glucose: 122 mg/dL — ABNORMAL HIGH (ref 65–99)
Potassium: 4.9 mmol/L (ref 3.5–5.2)
Sodium: 142 mmol/L (ref 134–144)
Total Protein: 7.3 g/dL (ref 6.0–8.5)
eGFR: 45 mL/min/{1.73_m2} — ABNORMAL LOW (ref >59)

## 2020-12-16 LAB — CBC WITH DIFFERENTIAL/PLATELET
Basophils Absolute: 0.1 10*3/uL (ref 0.0–0.2)
Basos: 1 %
EOS (ABSOLUTE): 0.3 10*3/uL (ref 0.0–0.4)
Eos: 3 %
Hematocrit: 44.4 % (ref 37.5–51.0)
Hemoglobin: 14.4 g/dL (ref 13.0–17.7)
Immature Grans (Abs): 0 10*3/uL (ref 0.0–0.1)
Immature Granulocytes: 0 %
Lymphocytes Absolute: 2.5 10*3/uL (ref 0.7–3.1)
Lymphs: 29 %
MCH: 28.2 pg (ref 26.6–33.0)
MCHC: 32.4 g/dL (ref 31.5–35.7)
MCV: 87 fL (ref 79–97)
Monocytes Absolute: 0.7 10*3/uL (ref 0.1–0.9)
Monocytes: 9 %
Neutrophils Absolute: 4.9 10*3/uL (ref 1.4–7.0)
Neutrophils: 58 %
Platelets: 265 10*3/uL (ref 150–450)
RBC: 5.11 x10E6/uL (ref 4.14–5.80)
RDW: 14 % (ref 11.6–15.4)
WBC: 8.5 10*3/uL (ref 3.4–10.8)

## 2020-12-16 LAB — PSA: Prostate Specific Ag, Serum: 1.3 ng/mL (ref 0.0–4.0)

## 2020-12-16 LAB — LIPID PANEL
Chol/HDL Ratio: 3.1 ratio (ref 0.0–5.0)
Cholesterol, Total: 116 mg/dL (ref 100–199)
HDL: 37 mg/dL — ABNORMAL LOW (ref >39)
LDL Chol Calc (NIH): 57 mg/dL (ref 0–99)
Triglycerides: 125 mg/dL (ref 0–149)
VLDL Cholesterol Cal: 22 mg/dL (ref 5–40)

## 2020-12-16 LAB — TSH: TSH: 3.12 u[IU]/mL (ref 0.450–4.500)

## 2020-12-16 LAB — URIC ACID: Uric Acid: 4.3 mg/dL (ref 3.8–8.4)

## 2020-12-20 ENCOUNTER — Ambulatory Visit: Payer: Medicare Other

## 2020-12-30 DIAGNOSIS — E782 Mixed hyperlipidemia: Secondary | ICD-10-CM | POA: Diagnosis not present

## 2020-12-30 DIAGNOSIS — I10 Essential (primary) hypertension: Secondary | ICD-10-CM | POA: Diagnosis not present

## 2020-12-30 DIAGNOSIS — Z9861 Coronary angioplasty status: Secondary | ICD-10-CM | POA: Diagnosis not present

## 2020-12-30 DIAGNOSIS — R0602 Shortness of breath: Secondary | ICD-10-CM | POA: Diagnosis not present

## 2021-01-03 ENCOUNTER — Other Ambulatory Visit: Payer: Self-pay

## 2021-01-03 ENCOUNTER — Encounter: Payer: Self-pay | Admitting: Internal Medicine

## 2021-01-03 ENCOUNTER — Ambulatory Visit (INDEPENDENT_AMBULATORY_CARE_PROVIDER_SITE_OTHER): Payer: Medicare Other

## 2021-01-03 ENCOUNTER — Ambulatory Visit (INDEPENDENT_AMBULATORY_CARE_PROVIDER_SITE_OTHER): Payer: Medicare Other | Admitting: Internal Medicine

## 2021-01-03 VITALS — BP 151/77 | HR 64 | Temp 98.3°F | Ht 67.24 in | Wt 256.0 lb

## 2021-01-03 VITALS — Ht 67.0 in | Wt 256.0 lb

## 2021-01-03 DIAGNOSIS — R7301 Impaired fasting glucose: Secondary | ICD-10-CM | POA: Diagnosis not present

## 2021-01-03 DIAGNOSIS — Z0000 Encounter for general adult medical examination without abnormal findings: Secondary | ICD-10-CM

## 2021-01-03 DIAGNOSIS — R7989 Other specified abnormal findings of blood chemistry: Secondary | ICD-10-CM

## 2021-01-03 LAB — BAYER DCA HB A1C WAIVED: HB A1C (BAYER DCA - WAIVED): 6.4 % (ref <7.0)

## 2021-01-03 MED ORDER — FLUTICASONE PROPIONATE 50 MCG/ACT NA SUSP: 2.0000 | Freq: Every day | NASAL | 6 refills | Status: DC

## 2021-01-03 MED ORDER — FEXOFENADINE HCL 180 MG PO TABS: 180.0000 mg | ORAL_TABLET | Freq: Every day | ORAL | 1 refills | Status: DC

## 2021-01-03 NOTE — Progress Notes (Signed)
I connected with Ricky Melton today by telephone and verified that I am speaking with the correct person using two identifiers. Location patient: home Location provider: work Persons participating in the virtual visit: Anacleto Batterman , Acelin Ferdig LPN.   I discussed the limitations, risks, security and privacy concerns of performing an evaluation and management service by telephone and the availability of in person appointments. I also discussed with the patient that there may be a patient responsible charge related to this service. The patient expressed understanding and verbally consented to this telephonic visit.    Interactive audio and video telecommunications were attempted between this provider and patient, however failed, due to patient having technical difficulties OR patient did not have access to video capability.  We continued and completed visit with audio only.     Vital signs may be patient reported or missing.  Subjective:   Ricky Melton is a 70 y.o. male who presents for Medicare Annual/Subsequent preventive examination.  Review of Systems     Cardiac Risk Factors include: advanced age (>48men, >15 women);dyslipidemia;hypertension;male gender;obesity (BMI >30kg/m2);sedentary lifestyle     Objective:    Today's Vitals   01/03/21 1351  Weight: 256 lb (116.1 kg)  Height: 5\' 7"  (1.702 m)   Body mass index is 40.1 kg/m.  Advanced Directives 01/03/2021 06/26/2020 11/19/2019 11/07/2018 05/10/2018 10/25/2017  Does Patient Have a Medical Advance Directive? No No No No No No  Would patient like information on creating a medical advance directive? - - - Yes (MAU/Ambulatory/Procedural Areas - Information given) - Yes (MAU/Ambulatory/Procedural Areas - Information given)    Current Medications (verified) Outpatient Encounter Medications as of 01/03/2021  Medication Sig  . albuterol (VENTOLIN HFA) 108 (90 Base) MCG/ACT inhaler   . allopurinol (ZYLOPRIM) 300 MG tablet Take 1  tablet (300 mg total) by mouth daily.  Marland Kitchen amLODipine (NORVASC) 10 MG tablet Take 1 tablet (10 mg total) by mouth daily.  Marland Kitchen aspirin 81 MG tablet Take 81 mg by mouth daily.  . benazepril (LOTENSIN) 40 MG tablet Take 1 tablet (40 mg total) by mouth daily.  . carvedilol (COREG) 25 MG tablet Take 1 tablet (25 mg total) by mouth 2 (two) times daily with a meal.  . chlorthalidone (HYGROTON) 25 MG tablet Take 0.5 tablets (12.5 mg total) by mouth daily.  . clopidogrel (PLAVIX) 75 MG tablet Take 1 tablet (75 mg total) by mouth daily.  . fexofenadine (ALLEGRA ALLERGY) 180 MG tablet Take 1 tablet (180 mg total) by mouth daily.  . fluticasone (FLONASE) 50 MCG/ACT nasal spray Place 2 sprays into both nostrils daily.  Marland Kitchen levothyroxine (SYNTHROID) 50 MCG tablet TAKE 1 TABLET BY MOUTH  DAILY  . pantoprazole (PROTONIX) 40 MG tablet Take 1 tablet by mouth daily.  . rosuvastatin (CRESTOR) 40 MG tablet Take 1 tablet (40 mg total) by mouth daily.  . valACYclovir (VALTREX) 1000 MG tablet Take 1 tablet (1,000 mg total) by mouth 2 (two) times daily.   No facility-administered encounter medications on file as of 01/03/2021.    Allergies (verified) Patient has no known allergies.   History: Past Medical History:  Diagnosis Date  . Benign hematuria   . CAD (coronary artery disease)   . Chronic kidney disease   . Hyperlipidemia   . Hypertension    Past Surgical History:  Procedure Laterality Date  . cardiac stents     x2  . COLONOSCOPY WITH PROPOFOL N/A 05/10/2018   Procedure: COLONOSCOPY WITH PROPOFOL;  Surgeon: Jonathon Bellows, MD;  Location: [facility] ENDOSCOPY;  Service: Gastroenterology;  Laterality: N/A;  . EYE SURGERY Right 2021   cataract sx in [place]    Family History  Problem Relation Age of Onset  . Hypertension Mother   . Diabetes Sister   . Diabetes Son    Social History   Socioeconomic History  . Marital status: Married    Spouse name: Not on file  . Number of children: Not on file  . Years  of education: Not on file  . Highest education level: High school graduate  Occupational History  . Occupation: maintenence     Comment: part time   Tobacco Use  . Smoking status: Never Smoker  . Smokeless tobacco: Former Systems developer    Types: Secondary school teacher  . Vaping Use: Never used  Substance and Sexual Activity  . Alcohol use: No    Alcohol/week: 0.0 standard drinks  . Drug use: No  . Sexual activity: Yes  Other Topics Concern  . Not on file  Social History Narrative   Plays golf with friends, mows yards    Social Determinants of Health   Financial Resource Strain: Low Risk   . Difficulty of Paying Living Expenses: Not hard at all  Food Insecurity: No Food Insecurity  . Worried About Charity fundraiser in the Last Year: Never true  . Ran Out of Food in the Last Year: Never true  Transportation Needs: No Transportation Needs  . Lack of Transportation (Medical): No  . Lack of Transportation (Non-Medical): No  Physical Activity: Inactive  . Days of Exercise per Week: 0 days  . Minutes of Exercise per Session: 0 min  Stress: No Stress Concern Present  . Feeling of Stress : Not at all  Social Connections: Socially Integrated  . Frequency of Communication with Friends and Family: More than three times a week  . Frequency of Social Gatherings with Friends and Family: More than three times a week  . Attends Religious Services: More than 4 times per year  . Active Member of Clubs or Organizations: Yes  . Attends Archivist Meetings: More than 4 times per year  . Marital Status: Married    Tobacco Counseling Counseling given: Not Answered   Clinical Intake:  Pre-visit preparation completed: Yes  Pain : No/denies pain     Nutritional Status: BMI > 30  Obese Nutritional Risks: None Diabetes: No  How often do you need to have someone help you when you read instructions, pamphlets, or other written materials from your doctor or pharmacy?: 1 - Never What is the  last grade level you completed in school?: 11th grade  Diabetic? no  Interpreter Needed?: No  Information entered by :: NAllen LPN   Activities of Daily Living In your present state of health, do you have any difficulty performing the following activities: 01/03/2021  Hearing? 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 Y  Comment a little trouble with right eye  Difficulty concentrating or making decisions? N  Walking or climbing stairs? N  Dressing or bathing? N  Doing errands, shopping? N  Preparing Food and eating ? N  Using the Toilet? N  In the past six months, have you accidently leaked urine? N  Do you have problems with loss of bowel control? N  Managing your Medications? N  Managing your Finances? N  Housekeeping or managing your Housekeeping? N  Some recent data might be hidden    Patient Care Team: Vigg,  Avanti, MD as PCP - General Dionisio David, MD as Consulting Physician (Cardiology) Vanita Ingles, RN as Case Manager (General Practice)  Indicate any recent Medical Services you may have received from other than [facility] providers in the past year (date may be approximate).     Assessment:   This is a routine wellness examination for [facility].  Hearing/Vision screen  Hearing Screening   125Hz  250Hz  500Hz  1000Hz  2000Hz  3000Hz  4000Hz  6000Hz  8000Hz   Right ear:           Left ear:           Vision Screening Comments: Regular eye exams,   Dietary issues and exercise activities discussed: Current Exercise Habits: The patient does not participate in regular exercise at present  Goals    . DIET - INCREASE WATER INTAKE     Recommend drinking at least 6-8 glasses of water a day     . Patient Stated     01/03/2021, start walking      Depression Screen PHQ 2/9 Scores 01/03/2021 01/03/2021 12/15/2020 02/10/2020 12/11/2019 11/19/2019 11/14/2018  PHQ - 2 Score 0 0 0 0 0 0 0  PHQ- 9 Score - - 0 - 0 - -    Fall Risk Fall Risk  01/03/2021 01/03/2021 12/15/2020 12/11/2019  11/19/2019  Falls in the past year? 0 0 0 0 0  Number falls in past yr: - - - 0 0  Injury with Fall? - 0 - 0 0  Risk for fall due to : Medication side effect - No Fall Risks - -  Follow up Falls evaluation completed;Education provided;Falls prevention discussed - - - -    FALL RISK PREVENTION PERTAINING TO THE HOME:  Any stairs in or around the home? Yes  If so, are there any without handrails? No  Home free of loose throw rugs in walkways, pet beds, electrical cords, etc? Yes  Adequate lighting in your home to reduce risk of falls? Yes   ASSISTIVE DEVICES UTILIZED TO PREVENT FALLS:  Life alert? No  Use of a cane, walker or w/c? No  Grab bars in the bathroom? No  Shower chair or bench in shower? No  Elevated toilet seat or a handicapped toilet? Yes   TIMED UP AND GO:  Was the test performed? No .     Cognitive Function:     6CIT Screen 01/03/2021 11/19/2019 11/07/2018 11/07/2018 10/25/2017  What Year? 0 points 0 points 0 points 0 points 0 points  What month? 0 points 0 points 0 points 0 points 0 points  What time? 0 points 0 points 0 points 0 points 0 points  Count back from 20 0 points 0 points 0 points 0 points 0 points  Months in reverse 0 points 0 points 0 points 0 points 0 points  Repeat phrase 8 points 0 points 2 points 0 points 0 points  Total Score 8 0 2 0 0    Immunizations Immunization History  Administered Date(s) Administered  . Fluad Quad(high Dose 65+) 05/21/2019, 06/07/2020  . Influenza, High Dose Seasonal PF 06/27/2018  . Influenza,inj,Quad PF,6+ Mos 10/14/2015  . Moderna Sars-Covid-2 Vaccination 11/08/2019, 12/06/2019  . Pneumococcal Conjugate-13 04/13/2016  . Pneumococcal Polysaccharide-23 06/07/2006, 10/25/2017  . Td 01/23/2006  . Tdap 04/13/2016  . Zoster 08/21/2011    TDAP status: Up to date  Flu Vaccine status: Up to date  Pneumococcal vaccine status: Up to date  Covid-19 vaccine status: Completed vaccines  Qualifies for Shingles Vaccine?  Yes  Zostavax completed Yes   Shingrix Completed?: No.    Education has been provided regarding the importance of this vaccine. Patient has been advised to call insurance company to determine out of pocket expense if they have not yet received this vaccine. Advised may also receive vaccine at local pharmacy or Health Dept. Verbalized acceptance and understanding.  Screening Tests Health Maintenance  Topic Date Due  . COVID-19 Vaccine (3 - Booster for Moderna series) 06/07/2020  . INFLUENZA VACCINE  04/11/2021  . TETANUS/TDAP  04/13/2026  . COLONOSCOPY (Pts 45-6yrs Insurance coverage will need to be confirmed)  05/10/2028  . Hepatitis C Screening  Completed  . PNA vac Low Risk Adult  Completed  . HPV VACCINES  Aged Out    Health Maintenance  Health Maintenance Due  Topic Date Due  . COVID-19 Vaccine (3 - Booster for Moderna series) 06/07/2020    Colorectal cancer screening: No longer required.   Lung Cancer Screening: (Low Dose CT Chest recommended if Age 6-80 years, 30 pack-year currently smoking OR have quit w/in 15years.) does not qualify.   Lung Cancer Screening Referral: no  Additional Screening:  Hepatitis C Screening: does qualify; Completed 10/14/2015  Vision Screening: Recommended annual ophthalmology exams for early detection of glaucoma and other disorders of the eye. Is the patient up to date with their annual eye exam?  Yes  Who is the provider or what is the name of the office in which the patient attends annual eye exams? Don't remember name If pt is not established with a provider, would they like to be referred to a provider to establish care? No .   Dental Screening: Recommended annual dental exams for proper oral hygiene  Community Resource Referral / Chronic Care Management: CRR required this visit?  No   CCM required this visit?  No      Plan:     I have personally reviewed and noted the following in the patient's chart:   . Medical and social  history . Use of alcohol, tobacco or illicit drugs  . Current medications and supplements . Functional ability and status . Nutritional status . Physical activity . Advanced directives . List of other physicians . Hospitalizations, surgeries, and ER visits in previous 12 months . Vitals . Screenings to include cognitive, depression, and falls . Referrals and appointments  In addition, I have reviewed and discussed with patient certain preventive protocols, quality metrics, and best practice recommendations. A written personalized care plan for preventive services as well as general preventive health recommendations were provided to patient.     PRITHVI MERRIFIELD, LPN   [id number]   Nurse Notes:

## 2021-01-03 NOTE — Patient Instructions (Signed)
Recent Results (from the past 2160 hour(s))  Urinalysis, Routine w reflex microscopic     Status: Abnormal   Collection Time: 12/15/20  8:46 AM  Result Value Ref Range   Specific Gravity, UA 1.015 1.005 - 1.030   pH, UA 5.5 5.0 - 7.5   Color, UA Yellow Yellow   Appearance Ur Clear Clear   Leukocytes,UA Negative Negative   Protein,UA 2+ (A) Negative/Trace   Glucose, UA Negative Negative   Ketones, UA Negative Negative   RBC, UA Trace (A) Negative   Bilirubin, UA Negative Negative   Urobilinogen, Ur 0.2 0.2 - 1.0 mg/dL   Nitrite, UA Negative Negative   Microscopic Examination See below:   Microscopic Examination     Status: None   Collection Time: 12/15/20  8:46 AM   Urine  Result Value Ref Range   WBC, UA None seen 0 - 5 /hpf   RBC 0-2 0 - 2 /hpf   Epithelial Cells (non renal) None seen 0 - 10 /hpf   Bacteria, UA None seen None seen/Few  TSH     Status: None   Collection Time: 12/15/20  8:48 AM  Result Value Ref Range   TSH 3.120 0.450 - 4.500 uIU/mL  PSA     Status: None   Collection Time: 12/15/20  8:48 AM  Result Value Ref Range   Prostate Specific Ag, Serum 1.3 0.0 - 4.0 ng/mL    Comment: Roche ECLIA methodology. According to the American Urological Association, Serum PSA should decrease and remain at undetectable levels after radical prostatectomy. The AUA defines biochemical recurrence as an initial PSA value 0.2 ng/mL or greater followed by a subsequent confirmatory PSA value 0.2 ng/mL or greater. Values obtained with different assay methods or kits cannot be used interchangeably. Results cannot be interpreted as absolute evidence of the presence or absence of malignant disease.   Lipid panel     Status: Abnormal   Collection Time: 12/15/20  8:48 AM  Result Value Ref Range   Cholesterol, Total 116 100 - 199 mg/dL   Triglycerides 125 0 - 149 mg/dL   HDL 37 (L) >39 mg/dL   VLDL Cholesterol Cal 22 5 - 40 mg/dL   LDL Chol Calc (NIH) 57 0 - 99 mg/dL   Chol/HDL  Ratio 3.1 0.0 - 5.0 ratio    Comment:                                   T. Chol/HDL Ratio                                             Men  Women                               1/2 Avg.Risk  3.4    3.3                                   Avg.Risk  5.0    4.4                                2X Avg.Risk  9.6  7.1                                3X Avg.Risk 23.4   11.0   CBC with Differential/Platelet     Status: None   Collection Time: 12/15/20  8:48 AM  Result Value Ref Range   WBC 8.5 3.4 - 10.8 x10E3/uL   RBC 5.11 4.14 - 5.80 x10E6/uL   Hemoglobin 14.4 13.0 - 17.7 g/dL   Hematocrit 44.4 37.5 - 51.0 %   MCV 87 79 - 97 fL   MCH 28.2 26.6 - 33.0 pg   MCHC 32.4 31.5 - 35.7 g/dL   RDW 14.0 11.6 - 15.4 %   Platelets 265 150 - 450 x10E3/uL   Neutrophils 58 Not Estab. %   Lymphs 29 Not Estab. %   Monocytes 9 Not Estab. %   Eos 3 Not Estab. %   Basos 1 Not Estab. %   Neutrophils Absolute 4.9 1.4 - 7.0 x10E3/uL   Lymphocytes Absolute 2.5 0.7 - 3.1 x10E3/uL   Monocytes Absolute 0.7 0.1 - 0.9 x10E3/uL   EOS (ABSOLUTE) 0.3 0.0 - 0.4 x10E3/uL   Basophils Absolute 0.1 0.0 - 0.2 x10E3/uL   Immature Granulocytes 0 Not Estab. %   Immature Grans (Abs) 0.0 0.0 - 0.1 x10E3/uL  Comprehensive metabolic panel     Status: Abnormal   Collection Time: 12/15/20  8:48 AM  Result Value Ref Range   Glucose 122 (H) 65 - 99 mg/dL   BUN 24 8 - 27 mg/dL   Creatinine, Ser 1.63 (H) 0.76 - 1.27 mg/dL   eGFR 45 (L) >59 mL/min/1.73   BUN/Creatinine Ratio 15 10 - 24   Sodium 142 134 - 144 mmol/L   Potassium 4.9 3.5 - 5.2 mmol/L   Chloride 105 96 - 106 mmol/L   CO2 19 (L) 20 - 29 mmol/L   Calcium 9.8 8.6 - 10.2 mg/dL   Total Protein 7.3 6.0 - 8.5 g/dL   Albumin 4.5 3.8 - 4.8 g/dL   Globulin, Total 2.8 1.5 - 4.5 g/dL   Albumin/Globulin Ratio 1.6 1.2 - 2.2   Bilirubin Total 0.4 0.0 - 1.2 mg/dL   Alkaline Phosphatase 68 44 - 121 IU/L   AST 20 0 - 40 IU/L   ALT 17 0 - 44 IU/L  Uric acid     Status: None    Collection Time: 12/15/20  8:48 AM  Result Value Ref Range   Uric Acid 4.3 3.8 - 8.4 mg/dL    Comment:            Therapeutic target for gout patients: <6.0

## 2021-01-03 NOTE — Progress Notes (Signed)
BP (!) 151/77   Pulse 64   Temp 98.3 F (36.8 C) (Oral)   Ht 5' 7.24" (1.708 m)   Wt 256 lb (116.1 kg)   SpO2 98%   BMI 39.80 kg/m    Subjective:    Patient ID: Ricky Melton, male    DOB: Jan 30, 1951, 70 y.o.   MRN: [id number]  HPI: Ricky Melton is a 70 y.o. male  Elevated creatnine and elevated FSBS   Sinus Problem This is a new problem. The current episode started more than 1 year ago. Pertinent negatives include no chills, congestion, coughing, headaches or shortness of breath.  Hypertension This is a chronic problem. The problem is controlled. Pertinent negatives include no chest pain, headaches, malaise/fatigue, palpitations or shortness of breath.  Hyperlipidemia This is a chronic problem. Pertinent negatives include no chest pain or shortness of breath.  Coronary Artery Disease Presents for follow-up visit. Pertinent negatives include no chest pain, chest tightness, dizziness, leg swelling, palpitations or shortness of breath. Risk factors include hyperlipidemia and hypertension.    Chief Complaint  Patient presents with  . Sinus Problem    Started on Friday, just sinus drainage.  . Hypertension  . Hyperlipidemia  . Chronic Kidney Disease  . Coronary Artery Disease    Relevant past medical, surgical, family and social history reviewed and updated as indicated. Interim medical history since our last visit reviewed. Allergies and medications reviewed and updated.  Review of Systems  Constitutional: Negative for activity change, appetite change, chills, fatigue, fever and malaise/fatigue.  HENT: Negative for congestion.   Eyes: Negative for visual disturbance.  Respiratory: Negative for apnea, cough, chest tightness, shortness of breath and wheezing.   Cardiovascular: Negative for chest pain, palpitations and leg swelling.  Gastrointestinal: Negative for abdominal distention, abdominal pain, diarrhea and nausea.  Endocrine: Negative for cold intolerance, heat  intolerance, polydipsia, polyphagia and polyuria.  Genitourinary: Negative for difficulty urinating, dysuria, frequency, hematuria and urgency.  Skin: Negative for color change and rash.  Neurological: Negative for dizziness, speech difficulty, weakness, light-headedness, numbness and headaches.  Psychiatric/Behavioral: Negative for behavioral problems and confusion. The patient is not nervous/anxious.     Per HPI unless specifically indicated above     Objective:    BP (!) 151/77   Pulse 64   Temp 98.3 F (36.8 C) (Oral)   Ht 5' 7.24" (1.708 m)   Wt 256 lb (116.1 kg)   SpO2 98%   BMI 39.80 kg/m   Wt Readings from Last 3 Encounters:  01/03/21 256 lb (116.1 kg)  12/15/20 252 lb 6.4 oz (114.5 kg)  06/26/20 250 lb (113.4 kg)    Physical Exam Vitals and nursing note reviewed.  Constitutional:      General: He is not in acute distress.    Appearance: Normal appearance. He is not ill-appearing or diaphoretic.  HENT:     Head: Normocephalic and atraumatic.     Right Ear: Tympanic membrane and external ear normal. There is no impacted cerumen.     Left Ear: External ear normal.     Nose: No congestion or rhinorrhea.     Mouth/Throat:     Pharynx: No oropharyngeal exudate or posterior oropharyngeal erythema.  Eyes:     Conjunctiva/sclera: Conjunctivae normal.     Pupils: Pupils are equal, round, and reactive to light.  Cardiovascular:     Rate and Rhythm: Normal rate and regular rhythm.     Heart sounds: No murmur heard. No friction rub. No  gallop.   Pulmonary:     Effort: No respiratory distress.     Breath sounds: No stridor. No wheezing or rhonchi.  Chest:     Chest wall: No tenderness.  Abdominal:     General: Abdomen is flat. Bowel sounds are normal.     Palpations: Abdomen is soft. There is no mass.     Tenderness: There is no abdominal tenderness.  Musculoskeletal:     Cervical back: Normal range of motion and neck supple. No rigidity or tenderness.     Left  lower leg: No edema.  Skin:    General: Skin is warm and dry.  Neurological:     Mental Status: He is alert.     Results for orders placed or performed in visit on 12/15/20  Microscopic Examination   Urine  Result Value Ref Range   WBC, UA None seen 0 - 5 /hpf   RBC 0-2 0 - 2 /hpf   Epithelial Cells (non renal) None seen 0 - 10 /hpf   Bacteria, UA None seen None seen/Few  TSH  Result Value Ref Range   TSH 3.120 0.450 - 4.500 uIU/mL  PSA  Result Value Ref Range   Prostate Specific Ag, Serum 1.3 0.0 - 4.0 ng/mL  Lipid panel  Result Value Ref Range   Cholesterol, Total 116 100 - 199 mg/dL   Triglycerides 125 0 - 149 mg/dL   HDL 37 (L) >39 mg/dL   VLDL Cholesterol Cal 22 5 - 40 mg/dL   LDL Chol Calc (NIH) 57 0 - 99 mg/dL   Chol/HDL Ratio 3.1 0.0 - 5.0 ratio  CBC with Differential/Platelet  Result Value Ref Range   WBC 8.5 3.4 - 10.8 x10E3/uL   RBC 5.11 4.14 - 5.80 x10E6/uL   Hemoglobin 14.4 13.0 - 17.7 g/dL   Hematocrit 44.4 37.5 - 51.0 %   MCV 87 79 - 97 fL   MCH 28.2 26.6 - 33.0 pg   MCHC 32.4 31.5 - 35.7 g/dL   RDW 14.0 11.6 - 15.4 %   Platelets 265 150 - 450 x10E3/uL   Neutrophils 58 Not Estab. %   Lymphs 29 Not Estab. %   Monocytes 9 Not Estab. %   Eos 3 Not Estab. %   Basos 1 Not Estab. %   Neutrophils Absolute 4.9 1.4 - 7.0 x10E3/uL   Lymphocytes Absolute 2.5 0.7 - 3.1 x10E3/uL   Monocytes Absolute 0.7 0.1 - 0.9 x10E3/uL   EOS (ABSOLUTE) 0.3 0.0 - 0.4 x10E3/uL   Basophils Absolute 0.1 0.0 - 0.2 x10E3/uL   Immature Granulocytes 0 Not Estab. %   Immature Grans (Abs) 0.0 0.0 - 0.1 x10E3/uL  Comprehensive metabolic panel  Result Value Ref Range   Glucose 122 (H) 65 - 99 mg/dL   BUN 24 8 - 27 mg/dL   Creatinine, Ser 1.63 (H) 0.76 - 1.27 mg/dL   eGFR 45 (L) >59 mL/min/1.73   BUN/Creatinine Ratio 15 10 - 24   Sodium 142 134 - 144 mmol/L   Potassium 4.9 3.5 - 5.2 mmol/L   Chloride 105 96 - 106 mmol/L   CO2 19 (L) 20 - 29 mmol/L   Calcium 9.8 8.6 - 10.2 mg/dL    Total Protein 7.3 6.0 - 8.5 g/dL   Albumin 4.5 3.8 - 4.8 g/dL   Globulin, Total 2.8 1.5 - 4.5 g/dL   Albumin/Globulin Ratio 1.6 1.2 - 2.2   Bilirubin Total 0.4 0.0 - 1.2 mg/dL   Alkaline Phosphatase 68 44 -  121 IU/L   AST 20 0 - 40 IU/L   ALT 17 0 - 44 IU/L  Urinalysis, Routine w reflex microscopic  Result Value Ref Range   Specific Gravity, UA 1.015 1.005 - 1.030   pH, UA 5.5 5.0 - 7.5   Color, UA Yellow Yellow   Appearance Ur Clear Clear   Leukocytes,UA Negative Negative   Protein,UA 2+ (A) Negative/Trace   Glucose, UA Negative Negative   Ketones, UA Negative Negative   RBC, UA Trace (A) Negative   Bilirubin, UA Negative Negative   Urobilinogen, Ur 0.2 0.2 - 1.0 mg/dL   Nitrite, UA Negative Negative   Microscopic Examination See below:   Uric acid  Result Value Ref Range   Uric Acid 4.3 3.8 - 8.4 mg/dL        Current Outpatient Medications:  .  albuterol (VENTOLIN HFA) 108 (90 Base) MCG/ACT inhaler, , Disp: , Rfl:  .  allopurinol (ZYLOPRIM) 300 MG tablet, Take 1 tablet (300 mg total) by mouth daily., Disp: 90 tablet, Rfl: 0 .  amLODipine (NORVASC) 10 MG tablet, Take 1 tablet (10 mg total) by mouth daily., Disp: 90 tablet, Rfl: 1 .  aspirin 81 MG tablet, Take 81 mg by mouth daily., Disp: , Rfl:  .  benazepril (LOTENSIN) 40 MG tablet, Take 1 tablet (40 mg total) by mouth daily., Disp: 90 tablet, Rfl: 1 .  carvedilol (COREG) 25 MG tablet, Take 1 tablet (25 mg total) by mouth 2 (two) times daily with a meal., Disp: 180 tablet, Rfl: 1 .  chlorthalidone (HYGROTON) 25 MG tablet, Take 0.5 tablets (12.5 mg total) by mouth daily., Disp: 135 tablet, Rfl: 1 .  clopidogrel (PLAVIX) 75 MG tablet, Take 1 tablet (75 mg total) by mouth daily., Disp: 90 tablet, Rfl: 1 .  levothyroxine (SYNTHROID) 50 MCG tablet, TAKE 1 TABLET BY MOUTH  DAILY, Disp: 90 tablet, Rfl: 2 .  pantoprazole (PROTONIX) 40 MG tablet, Take 1 tablet by mouth daily., Disp: , Rfl:  .  rosuvastatin (CRESTOR) 40 MG tablet,  Take 1 tablet (40 mg total) by mouth daily., Disp: 90 tablet, Rfl: 1 .  valACYclovir (VALTREX) 1000 MG tablet, Take 1 tablet (1,000 mg total) by mouth 2 (two) times daily., Disp: 14 tablet, Rfl: 0    Assessment & Plan:  1. Chronic sinusitis  Will start pt on allegra Will start pt on flonase.   2. Elevated Blood glucose  Lifestyle modifications advised to pt. A1c at 6.4   Portion control and avoiding high carb low fat diet advised.  Diet plan given to pt   exercise plan given and encouraged.  To increase exercise to 150 mins a week ie 21/2 hours a week. Pt verbalises understanding of the above.   3. Elevated Creatinine;  check GFR   Problem List Items Addressed This Visit   None   Visit Diagnoses    Elevated fasting blood sugar    -  Primary   Relevant Orders   Bayer DCA Hb A1c Waived   Elevated serum creatinine       Relevant Orders   Comprehensive metabolic panel       Follow up plan: No follow-ups on file.

## 2021-01-03 NOTE — Patient Instructions (Signed)
[name] , Thank you for taking time to come for your Medicare Wellness Visit. I appreciate your ongoing commitment to your health goals. Please review the following plan we discussed and let me know if I can assist you in the future.   Screening recommendations/referrals: Colonoscopy: not required Recommended yearly ophthalmology/optometry visit for glaucoma screening and checkup Recommended yearly dental visit for hygiene and checkup  Vaccinations: Influenza vaccine: completed 06/07/2020, due 04/11/2021 Pneumococcal vaccine: completed 10/25/2017 Tdap vaccine: completed 04/13/2016, due 04/13/2026 Shingles vaccine: discussed   Covid-19:  12/06/2019, 11/08/2019  Advanced directives: Advance directive discussed with you today.  Conditions/risks identified: none  Next appointment: Follow up in one year for your annual wellness visit.   Preventive Care 70 Years and Older, Male Preventive care refers to lifestyle choices and visits with your health care provider that can promote health and wellness. What does preventive care include?  A yearly physical exam. This is also called an annual well check.  Dental exams once or twice a year.  Routine eye exams. Ask your health care provider how often you should have your eyes checked.  Personal lifestyle choices, including:  Daily care of your teeth and gums.  Regular physical activity.  Eating a healthy diet.  Avoiding tobacco and drug use.  Limiting alcohol use.  Practicing safe sex.  Taking low doses of aspirin every day.  Taking vitamin and mineral supplements as recommended by your health care provider. What happens during an annual well check? The services and screenings done by your health care provider during your annual well check will depend on your age, overall health, lifestyle risk factors, and family history of disease. Counseling  Your health care provider may ask you questions about your:  Alcohol use.  Tobacco  use.  Drug use.  Emotional well-being.  Home and relationship well-being.  Sexual activity.  Eating habits.  History of falls.  Memory and ability to understand (cognition).  Work and work Statistician. Screening  You may have the following tests or measurements:  Height, weight, and BMI.  Blood pressure.  Lipid and cholesterol levels. These may be checked every 5 years, or more frequently if you are over 70 years old.  Skin check.  Lung cancer screening. You may have this screening every year starting at age 70 if you have a 30-pack-year history of smoking and currently smoke or have quit within the past 15 years.  Fecal occult blood test (FOBT) of the stool. You may have this test every year starting at age 70.  Flexible sigmoidoscopy or colonoscopy. You may have a sigmoidoscopy every 5 years or a colonoscopy every 10 years starting at age 70.  Prostate cancer screening. Recommendations will vary depending on your family history and other risks.  Hepatitis C blood test.  Hepatitis B blood test.  Sexually transmitted disease (STD) testing.  Diabetes screening. This is done by checking your blood sugar (glucose) after you have not eaten for a while (fasting). You may have this done every 1-3 years.  Abdominal aortic aneurysm (AAA) screening. You may need this if you are a current or former smoker.  Osteoporosis. You may be screened starting at age 70 if you are at high risk. Talk with your health care provider about your test results, treatment options, and if necessary, the need for more tests. Vaccines  Your health care provider may recommend certain vaccines, such as:  Influenza vaccine. This is recommended every year.  Tetanus, diphtheria, and acellular pertussis (Tdap, Td) vaccine.  You may need a Td booster every 10 years.  Zoster vaccine. You may need this after age 70.  Pneumococcal 13-valent conjugate (PCV13) vaccine. One dose is recommended after age  70.  Pneumococcal polysaccharide (PPSV23) vaccine. One dose is recommended after age 70. Talk to your health care provider about which screenings and vaccines you need and how often you need them. This information is not intended to replace advice given to you by your health care provider. Make sure you discuss any questions you have with your health care provider. Document Released: 09/24/2015 Document Revised: 05/17/2016 Document Reviewed: 06/29/2015 Elsevier Interactive Patient Education  2017 [place] Prevention in the Home Falls can cause injuries. They can happen to people of all ages. There are many things you can do to make your home safe and to help prevent falls. What can I do on the outside of my home?  Regularly fix the edges of walkways and driveways and fix any cracks.  Remove anything that might make you trip as you walk through a door, such as a raised step or threshold.  Trim any bushes or trees on the path to your home.  Use bright outdoor lighting.  Clear any walking paths of anything that might make someone trip, such as rocks or tools.  Regularly check to see if handrails are loose or broken. Make sure that both sides of any steps have handrails.  Any raised decks and porches should have guardrails on the edges.  Have any leaves, snow, or ice cleared regularly.  Use sand or salt on walking paths during winter.  Clean up any spills in your garage right away. This includes oil or grease spills. What can I do in the bathroom?  Use night lights.  Install grab bars by the toilet and in the tub and shower. Do not use towel bars as grab bars.  Use non-skid mats or decals in the tub or shower.  If you need to sit down in the shower, use a plastic, non-slip stool.  Keep the floor dry. Clean up any water that spills on the floor as soon as it happens.  Remove soap buildup in the tub or shower regularly.  Attach bath mats securely with double-sided  non-slip rug tape.  Do not have throw rugs and other things on the floor that can make you trip. What can I do in the bedroom?  Use night lights.  Make sure that you have a light by your bed that is easy to reach.  Do not use any sheets or blankets that are too big for your bed. They should not hang down onto the floor.  Have a firm chair that has side arms. You can use this for support while you get dressed.  Do not have throw rugs and other things on the floor that can make you trip. What can I do in the kitchen?  Clean up any spills right away.  Avoid walking on wet floors.  Keep items that you use a lot in easy-to-reach places.  If you need to reach something above you, use a strong step stool that has a grab bar.  Keep electrical cords out of the way.  Do not use floor polish or wax that makes floors slippery. If you must use wax, use non-skid floor wax.  Do not have throw rugs and other things on the floor that can make you trip. What can I do with my stairs?  Do not leave any  items on the stairs.  Make sure that there are handrails on both sides of the stairs and use them. Fix handrails that are broken or loose. Make sure that handrails are as long as the stairways.  Check any carpeting to make sure that it is firmly attached to the stairs. Fix any carpet that is loose or worn.  Avoid having throw rugs at the top or bottom of the stairs. If you do have throw rugs, attach them to the floor with carpet tape.  Make sure that you have a light switch at the top of the stairs and the bottom of the stairs. If you do not have them, ask someone to add them for you. What else can I do to help prevent falls?  Wear shoes that:  Do not have high heels.  Have rubber bottoms.  Are comfortable and fit you well.  Are closed at the toe. Do not wear sandals.  If you use a stepladder:  Make sure that it is fully opened. Do not climb a closed stepladder.  Make sure that both  sides of the stepladder are locked into place.  Ask someone to hold it for you, if possible.  Clearly mark and make sure that you can see:  Any grab bars or handrails.  First and last steps.  Where the edge of each step is.  Use tools that help you move around (mobility aids) if they are needed. These include:  Canes.  Walkers.  Scooters.  Crutches.  Turn on the lights when you go into a dark area. Replace any light bulbs as soon as they burn out.  Set up your furniture so you have a clear path. Avoid moving your furniture around.  If any of your floors are uneven, fix them.  If there are any pets around you, be aware of where they are.  Review your medicines with your doctor. Some medicines can make you feel dizzy. This can increase your chance of falling. Ask your doctor what other things that you can do to help prevent falls. This information is not intended to replace advice given to you by your health care provider. Make sure you discuss any questions you have with your health care provider. Document Released: 06/24/2009 Document Revised: 02/03/2016 Document Reviewed: 10/02/2014 Elsevier Interactive Patient Education  2017 [employer].

## 2021-01-04 LAB — COMPREHENSIVE METABOLIC PANEL
ALT: 20 IU/L (ref 0–44)
AST: 29 IU/L (ref 0–40)
Albumin/Globulin Ratio: 1.8 (ref 1.2–2.2)
Albumin: 4.6 g/dL (ref 3.8–4.8)
Alkaline Phosphatase: 76 IU/L (ref 44–121)
BUN/Creatinine Ratio: 14 (ref 10–24)
BUN: 22 mg/dL (ref 8–27)
Bilirubin Total: 0.3 mg/dL (ref 0.0–1.2)
CO2: 14 mmol/L — ABNORMAL LOW (ref 20–29)
Calcium: 9.8 mg/dL (ref 8.6–10.2)
Chloride: 106 mmol/L (ref 96–106)
Creatinine, Ser: 1.55 mg/dL — ABNORMAL HIGH (ref 0.76–1.27)
Globulin, Total: 2.6 g/dL (ref 1.5–4.5)
Glucose: 117 mg/dL — ABNORMAL HIGH (ref 65–99)
Potassium: 5.3 mmol/L — ABNORMAL HIGH (ref 3.5–5.2)
Sodium: 143 mmol/L (ref 134–144)
Total Protein: 7.2 g/dL (ref 6.0–8.5)
eGFR: 48 mL/min/{1.73_m2} — ABNORMAL LOW (ref >59)

## 2021-01-05 ENCOUNTER — Telehealth: Payer: Self-pay

## 2021-01-05 NOTE — Telephone Encounter (Signed)
Copied from [place] [id number]. Topic: General - Other >> Jan 05, 2021 10:24 AM Tessa Lerner A wrote: Reason for CRM: Patient would like to be contacted regarding lab results from 01/03/21   Please contact to further advise when possible

## 2021-01-05 NOTE — Telephone Encounter (Signed)
Please advise 

## 2021-01-06 NOTE — Telephone Encounter (Signed)
Patient aware and verbalized understanding. °

## 2021-01-07 ENCOUNTER — Other Ambulatory Visit: Payer: Self-pay | Admitting: Family Medicine

## 2021-01-07 DIAGNOSIS — E039 Hypothyroidism, unspecified: Secondary | ICD-10-CM

## 2021-01-07 DIAGNOSIS — R0602 Shortness of breath: Secondary | ICD-10-CM | POA: Diagnosis not present

## 2021-01-07 DIAGNOSIS — I10 Essential (primary) hypertension: Secondary | ICD-10-CM

## 2021-01-13 DIAGNOSIS — R55 Syncope and collapse: Secondary | ICD-10-CM | POA: Diagnosis not present

## 2021-01-13 DIAGNOSIS — I10 Essential (primary) hypertension: Secondary | ICD-10-CM | POA: Diagnosis not present

## 2021-01-13 DIAGNOSIS — I340 Nonrheumatic mitral (valve) insufficiency: Secondary | ICD-10-CM | POA: Diagnosis not present

## 2021-01-13 DIAGNOSIS — R0602 Shortness of breath: Secondary | ICD-10-CM | POA: Diagnosis not present

## 2021-01-25 ENCOUNTER — Other Ambulatory Visit: Payer: Self-pay

## 2021-01-25 ENCOUNTER — Other Ambulatory Visit: Payer: Medicare Other

## 2021-01-25 DIAGNOSIS — I10 Essential (primary) hypertension: Secondary | ICD-10-CM

## 2021-01-25 DIAGNOSIS — I2510 Atherosclerotic heart disease of native coronary artery without angina pectoris: Secondary | ICD-10-CM | POA: Diagnosis not present

## 2021-01-25 DIAGNOSIS — I2583 Coronary atherosclerosis due to lipid rich plaque: Secondary | ICD-10-CM

## 2021-01-25 DIAGNOSIS — E785 Hyperlipidemia, unspecified: Secondary | ICD-10-CM

## 2021-01-25 DIAGNOSIS — R7301 Impaired fasting glucose: Secondary | ICD-10-CM | POA: Diagnosis not present

## 2021-01-25 LAB — BAYER DCA HB A1C WAIVED: HB A1C (BAYER DCA - WAIVED): 6.3 % (ref <7.0)

## 2021-01-26 LAB — COMPREHENSIVE METABOLIC PANEL
ALT: 17 IU/L (ref 0–44)
AST: 23 IU/L (ref 0–40)
Albumin/Globulin Ratio: 1.6 (ref 1.2–2.2)
Albumin: 4.4 g/dL (ref 3.8–4.8)
Alkaline Phosphatase: 69 IU/L (ref 44–121)
BUN/Creatinine Ratio: 16 (ref 10–24)
BUN: 28 mg/dL — ABNORMAL HIGH (ref 8–27)
Bilirubin Total: 0.3 mg/dL (ref 0.0–1.2)
CO2: 16 mmol/L — ABNORMAL LOW (ref 20–29)
Calcium: 9.8 mg/dL (ref 8.6–10.2)
Chloride: 102 mmol/L (ref 96–106)
Creatinine, Ser: 1.75 mg/dL — ABNORMAL HIGH (ref 0.76–1.27)
Globulin, Total: 2.8 g/dL (ref 1.5–4.5)
Glucose: 88 mg/dL (ref 65–99)
Potassium: 4.8 mmol/L (ref 3.5–5.2)
Sodium: 142 mmol/L (ref 134–144)
Total Protein: 7.2 g/dL (ref 6.0–8.5)
eGFR: 41 mL/min/{1.73_m2} — ABNORMAL LOW (ref >59)

## 2021-01-26 LAB — LIPID PANEL
Chol/HDL Ratio: 3.2 ratio (ref 0.0–5.0)
Cholesterol, Total: 103 mg/dL (ref 100–199)
HDL: 32 mg/dL — ABNORMAL LOW (ref >39)
LDL Chol Calc (NIH): 52 mg/dL (ref 0–99)
Triglycerides: 101 mg/dL (ref 0–149)
VLDL Cholesterol Cal: 19 mg/dL (ref 5–40)

## 2021-01-26 LAB — CBC WITH DIFFERENTIAL/PLATELET
Basophils Absolute: 0.1 10*3/uL (ref 0.0–0.2)
Basos: 1 %
EOS (ABSOLUTE): 0.2 10*3/uL (ref 0.0–0.4)
Eos: 3 %
Hematocrit: 40.1 % (ref 37.5–51.0)
Hemoglobin: 13.6 g/dL (ref 13.0–17.7)
Immature Grans (Abs): 0 10*3/uL (ref 0.0–0.1)
Immature Granulocytes: 0 %
Lymphocytes Absolute: 2.1 10*3/uL (ref 0.7–3.1)
Lymphs: 28 %
MCH: 28.9 pg (ref 26.6–33.0)
MCHC: 33.9 g/dL (ref 31.5–35.7)
MCV: 85 fL (ref 79–97)
Monocytes Absolute: 0.7 10*3/uL (ref 0.1–0.9)
Monocytes: 9 %
Neutrophils Absolute: 4.4 10*3/uL (ref 1.4–7.0)
Neutrophils: 59 %
Platelets: 221 10*3/uL (ref 150–450)
RBC: 4.7 x10E6/uL (ref 4.14–5.80)
RDW: 13.8 % (ref 11.6–15.4)
WBC: 7.4 10*3/uL (ref 3.4–10.8)

## 2021-02-03 ENCOUNTER — Other Ambulatory Visit: Payer: Self-pay

## 2021-02-03 ENCOUNTER — Encounter: Payer: Self-pay | Admitting: Internal Medicine

## 2021-02-03 ENCOUNTER — Ambulatory Visit (INDEPENDENT_AMBULATORY_CARE_PROVIDER_SITE_OTHER): Payer: Medicare Other | Admitting: Internal Medicine

## 2021-02-03 DIAGNOSIS — N400 Enlarged prostate without lower urinary tract symptoms: Secondary | ICD-10-CM

## 2021-02-03 DIAGNOSIS — R7309 Other abnormal glucose: Secondary | ICD-10-CM | POA: Diagnosis not present

## 2021-02-03 DIAGNOSIS — M1000 Idiopathic gout, unspecified site: Secondary | ICD-10-CM | POA: Diagnosis not present

## 2021-02-03 DIAGNOSIS — E039 Hypothyroidism, unspecified: Secondary | ICD-10-CM

## 2021-02-03 DIAGNOSIS — I2510 Atherosclerotic heart disease of native coronary artery without angina pectoris: Secondary | ICD-10-CM

## 2021-02-03 DIAGNOSIS — E785 Hyperlipidemia, unspecified: Secondary | ICD-10-CM

## 2021-02-03 DIAGNOSIS — I2583 Coronary atherosclerosis due to lipid rich plaque: Secondary | ICD-10-CM

## 2021-02-03 DIAGNOSIS — R7989 Other specified abnormal findings of blood chemistry: Secondary | ICD-10-CM

## 2021-02-03 MED ORDER — CHLORTHALIDONE 25 MG PO TABS: 12.5000 mg | ORAL_TABLET | Freq: Every day | ORAL | 1 refills | Status: DC

## 2021-02-03 NOTE — Progress Notes (Signed)
There were no vitals taken for this visit.   Subjective:    Patient ID: Ricky Melton, male    DOB: 05/18/1951, 70 y.o.   MRN: [id number]  HPI: Ricky Melton is a 70 y.o. male  I connected with SABEN DONIGAN on 02/03/21 by a video enabled telemedicine application and verified that I am speaking with the correct person using two identifiers.  I discussed the limitations of evaluation and management by telemedicine. The patient expressed understanding and agreed to proceed. "I discussed the limitations of evaluation and management by telemedicine and the availability of in person appointments. The patient expressed understanding and agreed to proceed"    This visit was completed via telephone due to the restrictions of the COVID-19 pandemic. All issues as above were discussed and addressed but no physical exam was performed. If it was felt that the patient should be evaluated in the office, they were directed there. The patient verbally consented to this visit. Patient was unable to complete an audio/visual visit due to Technical difficulties. Due to the catastrophic nature of the COVID-19 pandemic, this visit was done through audio contact only. Location of the patient: work Location of the provider: home Those involved with this call:  Provider: Charlynne Cousins, MD CMA: Frazier Butt, Magdalena Desk/Registration: Levert Feinstein  Time spent on call: 10 minutes with patient face to face via video conference. More than 50% of this time was spent in counseling and coordination of care. 10 minutes total spent in review of patient's record and preparation of their chart.    Hyperglycemia This is a chronic problem.    Chief Complaint  Patient presents with  . Hyperglycemia    Relevant past medical, surgical, family and social history reviewed and updated as indicated. Interim medical history since our last visit reviewed. Allergies and medications reviewed and updated.  Review of  Systems  Per HPI unless specifically indicated above     Objective:    There were no vitals taken for this visit.  Wt Readings from Last 3 Encounters:  01/03/21 256 lb (116.1 kg)  01/03/21 256 lb (116.1 kg)  12/15/20 252 lb 6.4 oz (114.5 kg)    Physical Exam Vitals reviewed: unable to perform sec to virtual visit.     Results for orders placed or performed in visit on 01/25/21  Lipid panel  Result Value Ref Range   Cholesterol, Total 103 100 - 199 mg/dL   Triglycerides 101 0 - 149 mg/dL   HDL 32 (L) >39 mg/dL   VLDL Cholesterol Cal 19 5 - 40 mg/dL   LDL Chol Calc (NIH) 52 0 - 99 mg/dL   Chol/HDL Ratio 3.2 0.0 - 5.0 ratio  Bayer DCA Hb A1c Waived  Result Value Ref Range   HB A1C (BAYER DCA - WAIVED) 6.3 <7.0 %  CBC with Differential/Platelet  Result Value Ref Range   WBC 7.4 3.4 - 10.8 x10E3/uL   RBC 4.70 4.14 - 5.80 x10E6/uL   Hemoglobin 13.6 13.0 - 17.7 g/dL   Hematocrit 40.1 37.5 - 51.0 %   MCV 85 79 - 97 fL   MCH 28.9 26.6 - 33.0 pg   MCHC 33.9 31.5 - 35.7 g/dL   RDW 13.8 11.6 - 15.4 %   Platelets 221 150 - 450 x10E3/uL   Neutrophils 59 Not Estab. %   Lymphs 28 Not Estab. %   Monocytes 9 Not Estab. %   Eos 3 Not Estab. %   Basos 1  Not Estab. %   Neutrophils Absolute 4.4 1.4 - 7.0 x10E3/uL   Lymphocytes Absolute 2.1 0.7 - 3.1 x10E3/uL   Monocytes Absolute 0.7 0.1 - 0.9 x10E3/uL   EOS (ABSOLUTE) 0.2 0.0 - 0.4 x10E3/uL   Basophils Absolute 0.1 0.0 - 0.2 x10E3/uL   Immature Granulocytes 0 Not Estab. %   Immature Grans (Abs) 0.0 0.0 - 0.1 x10E3/uL  Comprehensive metabolic panel  Result Value Ref Range   Glucose 88 65 - 99 mg/dL   BUN 28 (H) 8 - 27 mg/dL   Creatinine, Ser 1.75 (H) 0.76 - 1.27 mg/dL   eGFR 41 (L) >59 mL/min/1.73   BUN/Creatinine Ratio 16 10 - 24   Sodium 142 134 - 144 mmol/L   Potassium 4.8 3.5 - 5.2 mmol/L   Chloride 102 96 - 106 mmol/L   CO2 16 (L) 20 - 29 mmol/L   Calcium 9.8 8.6 - 10.2 mg/dL   Total Protein 7.2 6.0 - 8.5 g/dL    Albumin 4.4 3.8 - 4.8 g/dL   Globulin, Total 2.8 1.5 - 4.5 g/dL   Albumin/Globulin Ratio 1.6 1.2 - 2.2   Bilirubin Total 0.3 0.0 - 1.2 mg/dL   Alkaline Phosphatase 69 44 - 121 IU/L   AST 23 0 - 40 IU/L   ALT 17 0 - 44 IU/L        Current Outpatient Medications:  .  albuterol (VENTOLIN HFA) 108 (90 Base) MCG/ACT inhaler, , Disp: , Rfl:  .  allopurinol (ZYLOPRIM) 300 MG tablet, Take 1 tablet (300 mg total) by mouth daily., Disp: 90 tablet, Rfl: 0 .  amLODipine (NORVASC) 10 MG tablet, Take 1 tablet (10 mg total) by mouth daily., Disp: 90 tablet, Rfl: 1 .  aspirin 81 MG tablet, Take 81 mg by mouth daily., Disp: , Rfl:  .  benazepril (LOTENSIN) 40 MG tablet, Take 1 tablet (40 mg total) by mouth daily., Disp: 90 tablet, Rfl: 1 .  carvedilol (COREG) 25 MG tablet, TAKE 1 TABLET BY MOUTH  TWICE DAILY WITH A MEAL, Disp: 180 tablet, Rfl: 1 .  clopidogrel (PLAVIX) 75 MG tablet, Take 1 tablet (75 mg total) by mouth daily., Disp: 90 tablet, Rfl: 1 .  fexofenadine (ALLEGRA ALLERGY) 180 MG tablet, Take 1 tablet (180 mg total) by mouth daily., Disp: 10 tablet, Rfl: 1 .  fluticasone (FLONASE) 50 MCG/ACT nasal spray, Place 2 sprays into both nostrils daily., Disp: 16 g, Rfl: 6 .  levothyroxine (SYNTHROID) 50 MCG tablet, TAKE 1 TABLET BY MOUTH  DAILY, Disp: 90 tablet, Rfl: 3 .  pantoprazole (PROTONIX) 40 MG tablet, Take 1 tablet by mouth daily., Disp: , Rfl:  .  rosuvastatin (CRESTOR) 40 MG tablet, Take 1 tablet (40 mg total) by mouth daily., Disp: 90 tablet, Rfl: 1 .  valACYclovir (VALTREX) 1000 MG tablet, Take 1 tablet (1,000 mg total) by mouth 2 (two) times daily., Disp: 14 tablet, Rfl: 0 .  chlorthalidone (HYGROTON) 25 MG tablet, Take 0.5 tablets (12.5 mg total) by mouth daily., Disp: 135 tablet, Rfl: 1    Assessment & Plan:  Prediabetes:  Lifestyle modifications advised to pt. A1c at   Portion control and avoiding high carb low fat diet advised.  Diet plan given to pt   exercise plan given and  encouraged.  To increase exercise to 150 mins a week ie 21/2 hours a week. Pt verbalises understanding of the above.    Ref. Range 01/03/2021 08:41 01/03/2021 08:43 01/25/2021 08:12 01/25/2021 09:07  HB A1C (BAYER DCA -  WAIVED) Latest Ref Range: <7.0 % 6.4   6.3    CKD: Creatnine HIGHER.  Will refer to nephrology   Ref. Range 12/15/2020 08:48 01/03/2021 08:41 01/03/2021 08:43 01/25/2021 08:12  Creatinine Latest Ref Range: 0.76 - 1.27 mg/dL 1.63 (H)  1.55 (H) 1.75 (H)  Calcium Latest Ref Range: 8.6 - 10.2 mg/dL 9.8  9.8 9.8    Obesity lost about 20 lbs of weight per pt. Got off sodas/ bread/ sweets and more salad.  Lifestyle modifications advised to pt.   Portion control and avoiding high carb low fat diet advised.  Diet plan given to pt   exercise plan given and encouraged.  To increase exercise to 150 mins a week ie 21/2 hours a week. Pt verbalises understanding of the above.   HLD recheck FLP, check LFT's work on diet, SE of meds explained to pt. low fat and high fiber diet explained to pt. On crestor 40 mg  Ref. Range 12/15/2020 08:48 01/03/2021 08:41 01/03/2021 08:43 01/25/2021 08:12  Total CHOL/HDL Ratio Latest Ref Range: 0.0 - 5.0 ratio 3.1   3.2  Cholesterol, Total Latest Ref Range: 100 - 199 mg/dL 116   103  HDL Cholesterol Latest Ref Range: >39 mg/dL 37 (L)   32 (L)  Triglycerides Latest Ref Range: 0 - 149 mg/dL 125   101  VLDL Cholesterol Cal Latest Ref Range: 5 - 40 mg/dL 22   19  LDL Chol Calc (NIH) Latest Ref Range: 0 - 99 mg/dL 57   52   HTN: On norvasc and chlorthalidone. Continue current meds.  Medication compliance emphasised. pt advised to keep Bp logs. Pt verbalised understanding of the same. Pt to have a low salt diet . Exercise to reach a goal of at least 150 mins a week.  lifestyle modifications explained and pt understands importance of the above.   Problem List Items Addressed This Visit   None      Follow up plan: No follow-ups on file.

## 2021-02-23 ENCOUNTER — Telehealth: Payer: Self-pay

## 2021-02-23 NOTE — Telephone Encounter (Signed)
Called pt to schedule appt no answer left vm   Copied from [place] [phone]. Topic: General - Other >> Feb 23, 2021 11:31 AM Keene Breath wrote: Reason for CRM: Patient would like the nurse to call him regarding his BP.  He feels it has been dropping recently.  Please advise and call patient to discuss at [phone]

## 2021-02-24 ENCOUNTER — Ambulatory Visit (INDEPENDENT_AMBULATORY_CARE_PROVIDER_SITE_OTHER): Payer: Medicare Other | Admitting: Internal Medicine

## 2021-02-24 ENCOUNTER — Other Ambulatory Visit: Payer: Self-pay

## 2021-02-24 DIAGNOSIS — I10 Essential (primary) hypertension: Secondary | ICD-10-CM | POA: Diagnosis not present

## 2021-02-24 MED ORDER — AMLODIPINE BESYLATE 5 MG PO TABS: 5.0000 mg | ORAL_TABLET | Freq: Every day | ORAL | 1 refills | Status: DC

## 2021-02-24 NOTE — Telephone Encounter (Signed)
scheduled

## 2021-02-24 NOTE — Progress Notes (Signed)
BP 134/72   Pulse (!) 58   Temp 98.5 F (36.9 C) (Oral)   Ht 5' 7.24" (1.708 m)   Wt 234 lb 3.2 oz (106.2 kg)   BMI 36.42 kg/m    Subjective:    Patient ID: Ricky Melton, male    DOB: 1950-09-13, 70 y.o.   MRN: [id number]  Chief Complaint  Patient presents with  . light headed    When he walks, did not take his BP med today for past 3 to 4 days. Has been on a new diet, not eating like normal. Cut out caffeine cold turkeky.    HPI: Ricky Melton is a 70 y.o. male  Pt lost 9 lbs since last visit. Is walking and is on a diet co lightheadedness has lost about 20 lbs total since he started his diet.    Hypertension This is a chronic problem. The current episode started more than 1 month ago. Associated symptoms include malaise/fatigue. Pertinent negatives include no anxiety, blurred vision, chest pain, headaches, neck pain, orthopnea, palpitations, peripheral edema, PND, shortness of breath or sweats.   Chief Complaint  Patient presents with  . light headed    When he walks, did not take his BP med today for past 3 to 4 days. Has been on a new diet, not eating like normal. Cut out caffeine cold turkeky.    Relevant past medical, surgical, family and social history reviewed and updated as indicated. Interim medical history since our last visit reviewed. Allergies and medications reviewed and updated.  Review of Systems  Constitutional:  Positive for malaise/fatigue.  Eyes:  Negative for blurred vision.  Respiratory:  Negative for shortness of breath.   Cardiovascular:  Negative for chest pain, palpitations, orthopnea and PND.  Musculoskeletal:  Negative for neck pain.  Neurological:  Negative for headaches.   Per HPI unless specifically indicated above     Objective:    BP 134/72   Pulse (!) 58   Temp 98.5 F (36.9 C) (Oral)   Ht 5' 7.24" (1.708 m)   Wt 234 lb 3.2 oz (106.2 kg)   BMI 36.42 kg/m   Wt Readings from Last 3 Encounters:  02/24/21 234 lb 3.2 oz  (106.2 kg)  01/03/21 256 lb (116.1 kg)  01/03/21 256 lb (116.1 kg)    Physical Exam Vitals and nursing note reviewed.  Constitutional:      General: He is not in acute distress.    Appearance: Normal appearance. He is not ill-appearing or diaphoretic.  HENT:     Head: Normocephalic and atraumatic.     Right Ear: Tympanic membrane and external ear normal. There is no impacted cerumen.     Left Ear: External ear normal.     Nose: No congestion or rhinorrhea.     Mouth/Throat:     Pharynx: No oropharyngeal exudate or posterior oropharyngeal erythema.  Eyes:     Conjunctiva/sclera: Conjunctivae normal.     Pupils: Pupils are equal, round, and reactive to light.  Cardiovascular:     Rate and Rhythm: Normal rate and regular rhythm.     Heart sounds: No murmur heard.   No friction rub. No gallop.  Pulmonary:     Effort: No respiratory distress.     Breath sounds: No stridor. No wheezing or rhonchi.  Chest:     Chest wall: No tenderness.  Abdominal:     General: Abdomen is flat. Bowel sounds are normal.     Palpations: Abdomen is  soft. There is no mass.     Tenderness: There is no abdominal tenderness.  Musculoskeletal:     Cervical back: Normal range of motion and neck supple. No rigidity or tenderness.     Left lower leg: No edema.  Skin:    General: Skin is warm and dry.  Neurological:     Mental Status: He is alert.   Results for orders placed or performed in visit on 01/25/21  Lipid panel  Result Value Ref Range   Cholesterol, Total 103 100 - 199 mg/dL   Triglycerides 101 0 - 149 mg/dL   HDL 32 (L) >39 mg/dL   VLDL Cholesterol Cal 19 5 - 40 mg/dL   LDL Chol Calc (NIH) 52 0 - 99 mg/dL   Chol/HDL Ratio 3.2 0.0 - 5.0 ratio  Bayer DCA Hb A1c Waived  Result Value Ref Range   HB A1C (BAYER DCA - WAIVED) 6.3 <7.0 %  CBC with Differential/Platelet  Result Value Ref Range   WBC 7.4 3.4 - 10.8 x10E3/uL   RBC 4.70 4.14 - 5.80 x10E6/uL   Hemoglobin 13.6 13.0 - 17.7 g/dL    Hematocrit 40.1 37.5 - 51.0 %   MCV 85 79 - 97 fL   MCH 28.9 26.6 - 33.0 pg   MCHC 33.9 31.5 - 35.7 g/dL   RDW 13.8 11.6 - 15.4 %   Platelets 221 150 - 450 x10E3/uL   Neutrophils 59 Not Estab. %   Lymphs 28 Not Estab. %   Monocytes 9 Not Estab. %   Eos 3 Not Estab. %   Basos 1 Not Estab. %   Neutrophils Absolute 4.4 1.4 - 7.0 x10E3/uL   Lymphocytes Absolute 2.1 0.7 - 3.1 x10E3/uL   Monocytes Absolute 0.7 0.1 - 0.9 x10E3/uL   EOS (ABSOLUTE) 0.2 0.0 - 0.4 x10E3/uL   Basophils Absolute 0.1 0.0 - 0.2 x10E3/uL   Immature Granulocytes 0 Not Estab. %   Immature Grans (Abs) 0.0 0.0 - 0.1 x10E3/uL  Comprehensive metabolic panel  Result Value Ref Range   Glucose 88 65 - 99 mg/dL   BUN 28 (H) 8 - 27 mg/dL   Creatinine, Ser 1.75 (H) 0.76 - 1.27 mg/dL   eGFR 41 (L) >59 mL/min/1.73   BUN/Creatinine Ratio 16 10 - 24   Sodium 142 134 - 144 mmol/L   Potassium 4.8 3.5 - 5.2 mmol/L   Chloride 102 96 - 106 mmol/L   CO2 16 (L) 20 - 29 mmol/L   Calcium 9.8 8.6 - 10.2 mg/dL   Total Protein 7.2 6.0 - 8.5 g/dL   Albumin 4.4 3.8 - 4.8 g/dL   Globulin, Total 2.8 1.5 - 4.5 g/dL   Albumin/Globulin Ratio 1.6 1.2 - 2.2   Bilirubin Total 0.3 0.0 - 1.2 mg/dL   Alkaline Phosphatase 69 44 - 121 IU/L   AST 23 0 - 40 IU/L   ALT 17 0 - 44 IU/L        Current Outpatient Medications:  .  albuterol (VENTOLIN HFA) 108 (90 Base) MCG/ACT inhaler, , Disp: , Rfl:  .  allopurinol (ZYLOPRIM) 300 MG tablet, Take 1 tablet (300 mg total) by mouth daily., Disp: 90 tablet, Rfl: 0 .  amLODipine (NORVASC) 10 MG tablet, Take 1 tablet (10 mg total) by mouth daily., Disp: 90 tablet, Rfl: 1 .  aspirin 81 MG tablet, Take 81 mg by mouth daily., Disp: , Rfl:  .  benazepril (LOTENSIN) 40 MG tablet, Take 1 tablet (40 mg total) by mouth  daily., Disp: 90 tablet, Rfl: 1 .  carvedilol (COREG) 25 MG tablet, TAKE 1 TABLET BY MOUTH  TWICE DAILY WITH A MEAL, Disp: 180 tablet, Rfl: 1 .  chlorthalidone (HYGROTON) 25 MG tablet, Take 0.5  tablets (12.5 mg total) by mouth daily., Disp: 135 tablet, Rfl: 1 .  clopidogrel (PLAVIX) 75 MG tablet, Take 1 tablet (75 mg total) by mouth daily., Disp: 90 tablet, Rfl: 1 .  fexofenadine (ALLEGRA ALLERGY) 180 MG tablet, Take 1 tablet (180 mg total) by mouth daily., Disp: 10 tablet, Rfl: 1 .  fluticasone (FLONASE) 50 MCG/ACT nasal spray, Place 2 sprays into both nostrils daily., Disp: 16 g, Rfl: 6 .  levothyroxine (SYNTHROID) 50 MCG tablet, TAKE 1 TABLET BY MOUTH  DAILY, Disp: 90 tablet, Rfl: 3 .  pantoprazole (PROTONIX) 40 MG tablet, Take 1 tablet by mouth daily., Disp: , Rfl:  .  rosuvastatin (CRESTOR) 40 MG tablet, Take 1 tablet (40 mg total) by mouth daily., Disp: 90 tablet, Rfl: 1    Assessment & Plan:  Lost abotu 20 lbs since he started seeing me  Walking about 4 miles a day  Vitals with BMI 02/24/2021 01/03/2021  Weight 234 lbs 3 oz 256 lbs  BMI 36.41 40.09    Htn redcue amlodipine to 5 mg stop chlorthalidone. Fu with me x 1 week Continue current meds.  Medication compliance emphasised. pt advised to keep Bp logs. Pt verbalised understanding of the same. Pt to have a low salt diet . Exercise to reach a goal of at least 150 mins a week.  lifestyle modifications explained and pt understands importance of the above.   CKD: to fu with nephrology for such   Problem List Items Addressed This Visit   None    No orders of the defined types were placed in this encounter.    No orders of the defined types were placed in this encounter.    Follow up plan: No follow-ups on file.

## 2021-02-25 ENCOUNTER — Encounter: Payer: Self-pay | Admitting: Internal Medicine

## 2021-03-02 ENCOUNTER — Telehealth: Payer: Self-pay

## 2021-03-02 ENCOUNTER — Telehealth: Payer: Self-pay | Admitting: General Practice

## 2021-03-02 NOTE — Telephone Encounter (Signed)
  Care Management   Follow Up Note   03/02/2021 Name: Ricky Melton MRN: [id number] DOB: [birth year]   Referred by: Charlynne Cousins, MD Reason for referral : Chronic Care Management (RNCM: Follow up for Chronic Disease Management and Care Coordination Needs )   An unsuccessful telephone outreach was attempted today. The patient was referred to the case management team for assistance with care management and care coordination.   Follow Up Plan: A HIPPA compliant phone message was left for the patient providing contact information and requesting a return call.  Noreene Larsson RN, MSN, [facility] Mobile: [phone]

## 2021-03-03 ENCOUNTER — Other Ambulatory Visit: Payer: Self-pay

## 2021-03-03 ENCOUNTER — Ambulatory Visit (INDEPENDENT_AMBULATORY_CARE_PROVIDER_SITE_OTHER): Payer: Medicare Other | Admitting: Internal Medicine

## 2021-03-03 VITALS — BP 130/75 | HR 55 | Temp 98.2°F | Ht 67.24 in | Wt 234.2 lb

## 2021-03-03 DIAGNOSIS — E785 Hyperlipidemia, unspecified: Secondary | ICD-10-CM

## 2021-03-03 DIAGNOSIS — N400 Enlarged prostate without lower urinary tract symptoms: Secondary | ICD-10-CM

## 2021-03-03 DIAGNOSIS — E039 Hypothyroidism, unspecified: Secondary | ICD-10-CM

## 2021-03-03 DIAGNOSIS — R7309 Other abnormal glucose: Secondary | ICD-10-CM | POA: Diagnosis not present

## 2021-03-03 DIAGNOSIS — I10 Essential (primary) hypertension: Secondary | ICD-10-CM

## 2021-03-03 MED ORDER — BENAZEPRIL HCL 20 MG PO TABS
20.0000 mg | ORAL_TABLET | Freq: Every day | ORAL | 1 refills | Status: DC
Start: 2021-03-03 — End: 2021-07-22

## 2021-03-03 NOTE — Progress Notes (Signed)
BP 130/75   Pulse (!) 55   Temp 98.2 F (36.8 C) (Oral)   Ht 5' 7.24" (1.708 m)   Wt 234 lb 3.2 oz (106.2 kg)   SpO2 97%   BMI 36.42 kg/m    Subjective:    Patient ID: Ricky Melton, male    DOB: 08/04/1951, 70 y.o.   MRN: [id number]  Chief Complaint  Patient presents with  . Hypertension    HPI: Ricky Melton is a 70 y.o. male  Pt is here for a fu on his Htn / bp logs he is currenlt on lower doses of his bp meds sec to about 20 lbs of weight loss, feels better , has had some high numbers x 2 in last 2 weeks otherwise better readings over all  Hypertension This is a chronic (bp logs at home better - has a lot of bp readings 134/ 72 mm hg - 146/72 mm hg.) problem. The problem has been gradually improving since onset. The problem is controlled. Pertinent negatives include no anxiety, blurred vision, chest pain, headaches, malaise/fatigue, neck pain, orthopnea, palpitations, peripheral edema, PND or shortness of breath.   Chief Complaint  Patient presents with  . Hypertension    Relevant past medical, surgical, family and social history reviewed and updated as indicated. Interim medical history since our last visit reviewed. Allergies and medications reviewed and updated.  Review of Systems  Constitutional:  Negative for malaise/fatigue.  Eyes:  Negative for blurred vision.  Respiratory:  Negative for shortness of breath.   Cardiovascular:  Negative for chest pain, palpitations, orthopnea and PND.  Musculoskeletal:  Negative for neck pain.  Neurological:  Negative for headaches.   Per HPI unless specifically indicated above     Objective:    BP 130/75   Pulse (!) 55   Temp 98.2 F (36.8 C) (Oral)   Ht 5' 7.24" (1.708 m)   Wt 234 lb 3.2 oz (106.2 kg)   SpO2 97%   BMI 36.42 kg/m   Wt Readings from Last 3 Encounters:  03/03/21 234 lb 3.2 oz (106.2 kg)  02/24/21 234 lb 3.2 oz (106.2 kg)  01/03/21 256 lb (116.1 kg)    Physical Exam Vitals and nursing note  reviewed.  Constitutional:      General: He is not in acute distress.    Appearance: Normal appearance. He is not ill-appearing or diaphoretic.  HENT:     Head: Normocephalic and atraumatic.     Right Ear: Tympanic membrane and external ear normal. There is no impacted cerumen.     Left Ear: External ear normal.     Nose: No congestion or rhinorrhea.     Mouth/Throat:     Pharynx: No oropharyngeal exudate or posterior oropharyngeal erythema.  Eyes:     Conjunctiva/sclera: Conjunctivae normal.     Pupils: Pupils are equal, round, and reactive to light.  Cardiovascular:     Rate and Rhythm: Normal rate and regular rhythm.     Heart sounds: No murmur heard.   No friction rub. No gallop.  Pulmonary:     Effort: No respiratory distress.     Breath sounds: No stridor. No wheezing or rhonchi.  Chest:     Chest wall: No tenderness.  Abdominal:     General: Abdomen is flat. Bowel sounds are normal.     Palpations: Abdomen is soft. There is no mass.     Tenderness: There is no abdominal tenderness.  Musculoskeletal:  Cervical back: Normal range of motion and neck supple. No rigidity or tenderness.     Left lower leg: No edema.  Skin:    General: Skin is warm and dry.  Neurological:     Mental Status: He is alert.  Psychiatric:        Mood and Affect: Mood normal.        Thought Content: Thought content normal.   Results for orders placed or performed in visit on 01/25/21  Lipid panel  Result Value Ref Range   Cholesterol, Total 103 100 - 199 mg/dL   Triglycerides 101 0 - 149 mg/dL   HDL 32 (L) >39 mg/dL   VLDL Cholesterol Cal 19 5 - 40 mg/dL   LDL Chol Calc (NIH) 52 0 - 99 mg/dL   Chol/HDL Ratio 3.2 0.0 - 5.0 ratio  Bayer DCA Hb A1c Waived  Result Value Ref Range   HB A1C (BAYER DCA - WAIVED) 6.3 <7.0 %  CBC with Differential/Platelet  Result Value Ref Range   WBC 7.4 3.4 - 10.8 x10E3/uL   RBC 4.70 4.14 - 5.80 x10E6/uL   Hemoglobin 13.6 13.0 - 17.7 g/dL   Hematocrit  40.1 37.5 - 51.0 %   MCV 85 79 - 97 fL   MCH 28.9 26.6 - 33.0 pg   MCHC 33.9 31.5 - 35.7 g/dL   RDW 13.8 11.6 - 15.4 %   Platelets 221 150 - 450 x10E3/uL   Neutrophils 59 Not Estab. %   Lymphs 28 Not Estab. %   Monocytes 9 Not Estab. %   Eos 3 Not Estab. %   Basos 1 Not Estab. %   Neutrophils Absolute 4.4 1.4 - 7.0 x10E3/uL   Lymphocytes Absolute 2.1 0.7 - 3.1 x10E3/uL   Monocytes Absolute 0.7 0.1 - 0.9 x10E3/uL   EOS (ABSOLUTE) 0.2 0.0 - 0.4 x10E3/uL   Basophils Absolute 0.1 0.0 - 0.2 x10E3/uL   Immature Granulocytes 0 Not Estab. %   Immature Grans (Abs) 0.0 0.0 - 0.1 x10E3/uL  Comprehensive metabolic panel  Result Value Ref Range   Glucose 88 65 - 99 mg/dL   BUN 28 (H) 8 - 27 mg/dL   Creatinine, Ser 1.75 (H) 0.76 - 1.27 mg/dL   eGFR 41 (L) >59 mL/min/1.73   BUN/Creatinine Ratio 16 10 - 24   Sodium 142 134 - 144 mmol/L   Potassium 4.8 3.5 - 5.2 mmol/L   Chloride 102 96 - 106 mmol/L   CO2 16 (L) 20 - 29 mmol/L   Calcium 9.8 8.6 - 10.2 mg/dL   Total Protein 7.2 6.0 - 8.5 g/dL   Albumin 4.4 3.8 - 4.8 g/dL   Globulin, Total 2.8 1.5 - 4.5 g/dL   Albumin/Globulin Ratio 1.6 1.2 - 2.2   Bilirubin Total 0.3 0.0 - 1.2 mg/dL   Alkaline Phosphatase 69 44 - 121 IU/L   AST 23 0 - 40 IU/L   ALT 17 0 - 44 IU/L        Current Outpatient Medications:  .  albuterol (VENTOLIN HFA) 108 (90 Base) MCG/ACT inhaler, , Disp: , Rfl:  .  allopurinol (ZYLOPRIM) 300 MG tablet, Take 1 tablet (300 mg total) by mouth daily., Disp: 90 tablet, Rfl: 0 .  amLODipine (NORVASC) 5 MG tablet, Take 1 tablet (5 mg total) by mouth daily., Disp: 30 tablet, Rfl: 1 .  aspirin 81 MG tablet, Take 81 mg by mouth daily., Disp: , Rfl:  .  benazepril (LOTENSIN) 40 MG tablet, Take 1 tablet (  40 mg total) by mouth daily., Disp: 90 tablet, Rfl: 1 .  carvedilol (COREG) 25 MG tablet, TAKE 1 TABLET BY MOUTH  TWICE DAILY WITH A MEAL, Disp: 180 tablet, Rfl: 1 .  clopidogrel (PLAVIX) 75 MG tablet, Take 1 tablet (75 mg total) by  mouth daily., Disp: 90 tablet, Rfl: 1 .  fexofenadine (ALLEGRA ALLERGY) 180 MG tablet, Take 1 tablet (180 mg total) by mouth daily., Disp: 10 tablet, Rfl: 1 .  fluticasone (FLONASE) 50 MCG/ACT nasal spray, Place 2 sprays into both nostrils daily., Disp: 16 g, Rfl: 6 .  levothyroxine (SYNTHROID) 50 MCG tablet, TAKE 1 TABLET BY MOUTH  DAILY, Disp: 90 tablet, Rfl: 3 .  pantoprazole (PROTONIX) 40 MG tablet, Take 1 tablet by mouth daily., Disp: , Rfl:  .  rosuvastatin (CRESTOR) 40 MG tablet, Take 1 tablet (40 mg total) by mouth daily., Disp: 90 tablet, Rfl: 1    Assessment & Plan:  HTN : is on 3 meds lotensin cut back to 20 mg, norvasc 5 mg and coreg bid HTN : Continue current meds.  Medication compliance emphasised. pt advised to keep Bp logs. Pt verbalised understanding of the same. Pt to have a low salt diet . Exercise to reach a goal of at least 150 mins a week.  lifestyle modifications explained and pt understands importance of the above.  Problem List Items Addressed This Visit   None    No orders of the defined types were placed in this encounter.    Meds ordered this encounter  Medications  . benazepril (LOTENSIN) 20 MG tablet    Sig: Take 1 tablet (20 mg total) by mouth daily.    Dispense:  90 tablet    Refill:  1    Requesting 1 year supply     Follow up plan: No follow-ups on file.

## 2021-03-04 ENCOUNTER — Encounter: Payer: Self-pay | Admitting: Internal Medicine

## 2021-03-05 ENCOUNTER — Other Ambulatory Visit: Payer: Self-pay | Admitting: Internal Medicine

## 2021-03-05 DIAGNOSIS — M1000 Idiopathic gout, unspecified site: Secondary | ICD-10-CM

## 2021-03-06 NOTE — Telephone Encounter (Signed)
Requested medication (s) are due for refill today: yes  Requested medication (s) are on the active medication list: yes  Last refill:  12/15/20  Future visit scheduled: yes  Notes to clinic:  last Cr elevated 1.75 on 01/25/21   Requested Prescriptions  Pending Prescriptions Disp Refills   allopurinol (ZYLOPRIM) 300 MG tablet [Pharmacy Med Name: Allopurinol 300 MG Oral Tablet] 90 tablet 3    Sig: TAKE 1 TABLET BY MOUTH  DAILY      Endocrinology:  Gout Agents Failed - 03/05/2021 10:53 PM      Failed - Cr in normal range and within 360 days    Creatinine, Ser  Date Value Ref Range Status  01/25/2021 1.75 (H) 0.76 - 1.27 mg/dL Final          Passed - Uric Acid in normal range and within 360 days    Uric Acid  Date Value Ref Range Status  12/15/2020 4.3 3.8 - 8.4 mg/dL Final    Comment:               Therapeutic target for gout patients: <6.0          Passed - Valid encounter within last 12 months    Recent Outpatient Visits           3 days ago Hypothyroidism, unspecified type   [facility] Vigg, Avanti, MD   1 week ago Essential hypertension   [place] Vigg, Avanti, MD   1 month ago Elevated serum creatinine   [facility] Vigg, Avanti, MD   2 months ago Elevated fasting blood sugar   [place] Vigg, Avanti, MD   2 months ago Screening for malignant neoplasm of prostate   [place] Vigg, Avanti, MD       Future Appointments             In 2 months Vigg, Avanti, MD [facility], [facility]   In 51 months  [facility], [place]

## 2021-03-07 NOTE — Telephone Encounter (Signed)
Pt is scheduled 9/22

## 2021-03-10 ENCOUNTER — Other Ambulatory Visit: Payer: Self-pay | Admitting: Internal Medicine

## 2021-03-10 NOTE — Telephone Encounter (Signed)
   Notes to clinic:  REQUEST FOR 90 DAYS PRESCRIPTION.   Requested Prescriptions  Pending Prescriptions Disp Refills   amLODipine (NORVASC) 5 MG tablet [Pharmacy Med Name: AMLODIPINE BESYLATE 5 MG TAB] 90 tablet 1    Sig: Take 1 tablet (5 mg total) by mouth daily.      Cardiovascular:  Calcium Channel Blockers Passed - 03/10/2021  8:23 AM      Passed - Last BP in normal range    BP Readings from Last 1 Encounters:  03/03/21 130/75          Passed - Valid encounter within last 6 months    Recent Outpatient Visits           1 week ago Hypothyroidism, unspecified type   [facility] Vigg, Avanti, MD   2 weeks ago Essential hypertension   [place] Vigg, Avanti, MD   1 month ago Elevated serum creatinine   [facility] Vigg, Avanti, MD   2 months ago Elevated fasting blood sugar   [facility] Vigg, Avanti, MD   2 months ago Screening for malignant neoplasm of prostate   [place] Vigg, Avanti, MD       Future Appointments             In 2 months Vigg, Avanti, MD [facility], [facility]   In 10 months  [facility], [place]

## 2021-03-10 NOTE — Telephone Encounter (Signed)
Pt is scheduled 9/22

## 2021-03-18 ENCOUNTER — Telehealth: Payer: Self-pay

## 2021-03-18 NOTE — Chronic Care Management (AMB) (Signed)
  Care Management   Note  03/18/2021 Name: Ricky Melton MRN: [id number] DOB: 26-Apr-1951  Ricky Melton is a 70 y.o. year old male who is a primary care patient of Vigg, Avanti, MD and is actively engaged with the care management team. I reached out to Curt Bears by phone today to assist with re-scheduling a follow up visit with the RN Case Manager  Follow up plan: Unsuccessful telephone outreach attempt made. A HIPAA compliant phone message was left for the patient providing contact information and requesting a return call.  The care management team will reach out to the patient again over the next 7 days.  If patient returns call to provider office, please advise to call [facility]  at [place], [place], [place], [place] [address] Direct Dial: [phone] [email or web address] Website: [email or web address]

## 2021-03-24 ENCOUNTER — Other Ambulatory Visit: Payer: Self-pay | Admitting: Nephrology

## 2021-03-24 ENCOUNTER — Other Ambulatory Visit (HOSPITAL_COMMUNITY): Payer: Self-pay | Admitting: Nephrology

## 2021-03-24 DIAGNOSIS — N1832 Chronic kidney disease, stage 3b: Secondary | ICD-10-CM | POA: Diagnosis not present

## 2021-03-24 DIAGNOSIS — M1000 Idiopathic gout, unspecified site: Secondary | ICD-10-CM | POA: Diagnosis not present

## 2021-03-24 DIAGNOSIS — R7303 Prediabetes: Secondary | ICD-10-CM | POA: Diagnosis not present

## 2021-03-24 DIAGNOSIS — I10 Essential (primary) hypertension: Secondary | ICD-10-CM | POA: Diagnosis not present

## 2021-03-24 DIAGNOSIS — R809 Proteinuria, unspecified: Secondary | ICD-10-CM | POA: Diagnosis not present

## 2021-03-24 DIAGNOSIS — R8290 Unspecified abnormal findings in urine: Secondary | ICD-10-CM | POA: Diagnosis not present

## 2021-04-08 ENCOUNTER — Telehealth: Payer: Medicare Other | Admitting: General Practice

## 2021-04-08 ENCOUNTER — Telehealth: Payer: Self-pay | Admitting: General Practice

## 2021-04-08 ENCOUNTER — Ambulatory Visit (INDEPENDENT_AMBULATORY_CARE_PROVIDER_SITE_OTHER): Payer: Medicare Other | Admitting: General Practice

## 2021-04-08 DIAGNOSIS — I2510 Atherosclerotic heart disease of native coronary artery without angina pectoris: Secondary | ICD-10-CM | POA: Diagnosis not present

## 2021-04-08 DIAGNOSIS — I10 Essential (primary) hypertension: Secondary | ICD-10-CM | POA: Diagnosis not present

## 2021-04-08 DIAGNOSIS — N182 Chronic kidney disease, stage 2 (mild): Secondary | ICD-10-CM

## 2021-04-08 DIAGNOSIS — I2583 Coronary atherosclerosis due to lipid rich plaque: Secondary | ICD-10-CM | POA: Diagnosis not present

## 2021-04-08 DIAGNOSIS — E785 Hyperlipidemia, unspecified: Secondary | ICD-10-CM | POA: Diagnosis not present

## 2021-04-08 NOTE — Patient Instructions (Signed)
Visit Information  PATIENT GOALS:  Goals Addressed             This Visit's Progress    RNCM: Manage My Diet       Timeframe:  Long-Range Goal Priority:  High Start Date:   04-08-2021                          Expected End Date:    04-08-2022                   Follow Up Date 06/10/2021    - ask for help if I have trouble affording healthy foods - choose foods low in fat and sugar - choose foods that are low in sodium (salt) - eat 2 servings of fruit/vegetables every day - eat 3 to 5 servings of fruits and vegetables each day - keep a food diary - prepare or eat main meal at home 3 to 5 days each week - keep healthy snacks on hand - meet with dietitian - read food labels for sodium (salt), fat and sugar content - take my supplements as prescribed - track weight in diary - watch for swelling in feet, ankles and legs every day    Why is this important?   A healthy diet is important for mental and physical health.  Healthy food helps repair damaged body tissue and maintains strong bones and muscles.  No single food is just right so eating a variety of proteins, fruits, vegetables and grains is best.  You may need to change what you eat or drink to manage kidney disease.  A dietitian is the best person to guide you.     Notes: 04-08-2021: The patient is losing weight and has cut out sodas. The patient states he is taking ownership of his health and well being.      RNCM: Track and Manage My Symptoms-Chronic Kidney       Timeframe:  Long-Range Goal Priority:  High Start Date:    04-08-2021                         Expected End Date:       04-08-2022                Follow Up Date 06/10/2021    - balance periods of rest with activity as needed - begin a symptom diary - bring symptom diary to all appointments - develop a new routine to improve sleep - don't eat or exercise right before bedtime - exercise at least 2 to 3 times per week - get outdoors every day (weather  permitting) - join a support group - keep all lab appointments - keep room cool and dark - make shared treatment decisions with doctor - pace activity allowing for rest - take a warm shower or bath before bed - take medicine as prescribed - use a fan or white noise in bedroom - use mild soap and lotion on itchy skin - watch for early signs of feeling worse    Why is this important?   Keeping track of symptoms can help you feel the best. It also helps the doctor stay on top of any changes to the disease. It may also help keep your disease from getting worse.  Taking simple steps can help you cope with symptoms like feeling very tired or itchy skin.     Notes: 04-08-2021: The patient  is watching his dietary habits and exercising now. He wants to make positive changes with his health and well being. The patient will have an ultrasound next week. Had labwork form specialist this week. GFR is 54%. Education and support given.         The patient verbalized understanding of instructions, educational materials, and care plan provided today and declined offer to receive copy of patient instructions, educational materials, and care plan.   Telephone follow up appointment with care management team member scheduled for: 06-10-2021 at 73 pm  Noreene Larsson RN, MSN, [facility] Mobile: [phone]

## 2021-04-08 NOTE — Chronic Care Management (AMB) (Signed)
Chronic Care Management   CCM RN Visit Note  04/08/2021 Name: Ricky Melton MRN: [id number] DOB: 1950/11/24  Subjective: Ricky Melton is a 70 y.o. year old male who is a primary care patient of Vigg, Avanti, MD. The care management team was consulted for assistance with disease management and care coordination needs.    Engaged with patient by telephone for follow up visit in response to provider referral for case management and/or care coordination services.   Consent to Services:  The patient was given information about Chronic Care Management services, agreed to services, and gave verbal consent prior to initiation of services.  Please see initial visit note for detailed documentation.   Patient agreed to services and verbal consent obtained.   Assessment: Review of patient past medical history, allergies, medications, health status, including review of consultants reports, laboratory and other test data, was performed as part of comprehensive evaluation and provision of chronic care management services.   SDOH (Social Determinants of Health) assessments and interventions performed:  SDOH Interventions    Flowsheet Row Most Recent Value  SDOH Interventions   Physical Activity Interventions Other (Comments)  [Wife and patient are walking daily]        CCM Care Plan  No Known Allergies  Outpatient Encounter Medications as of 04/08/2021  Medication Sig   albuterol (VENTOLIN HFA) 108 (90 Base) MCG/ACT inhaler    allopurinol (ZYLOPRIM) 300 MG tablet TAKE 1 TABLET BY MOUTH  DAILY   amLODipine (NORVASC) 5 MG tablet TAKE 1 TABLET (5 MG TOTAL) BY MOUTH DAILY.   aspirin 81 MG tablet Take 81 mg by mouth daily.   benazepril (LOTENSIN) 20 MG tablet Take 1 tablet (20 mg total) by mouth daily.   carvedilol (COREG) 25 MG tablet TAKE 1 TABLET BY MOUTH  TWICE DAILY WITH A MEAL   clopidogrel (PLAVIX) 75 MG tablet Take 1 tablet (75 mg total) by mouth daily.   fexofenadine (ALLEGRA ALLERGY)  180 MG tablet Take 1 tablet (180 mg total) by mouth daily.   fluticasone (FLONASE) 50 MCG/ACT nasal spray Place 2 sprays into both nostrils daily.   levothyroxine (SYNTHROID) 50 MCG tablet TAKE 1 TABLET BY MOUTH  DAILY   pantoprazole (PROTONIX) 40 MG tablet Take 1 tablet by mouth daily.   rosuvastatin (CRESTOR) 40 MG tablet Take 1 tablet (40 mg total) by mouth daily.   No facility-administered encounter medications on file as of 04/08/2021.    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   CAD (coronary artery disease) 03/23/2015   Benign hematuria 03/23/2015   Hypertension 03/23/2015   Hyperlipidemia 03/23/2015   Hypothyroidism 03/23/2015   Gout 03/23/2015    Conditions to be addressed/monitored:CAD, HTN, and HLD  Care Plan : RNCM: Coronary Artery Disease (Adult) and HLD  Updates made by Vanita Ingles since 04/08/2021 12:00 AM     Problem: RNCM: Disease Progression (Coronary Artery Disease)   Priority: Medium     Long-Range Goal: RNCM: Disease Progression Prevented or Minimized   Start Date: 10/13/2020  Expected End Date: 04/03/2022  This Visit's Progress: On track  Priority: Medium  Note:   Current Barriers:  Poorly controlled hyperlipidemia, complicated by ENT issue- COVID negative, HTN Current antihyperlipidemic regimen:Crestor 40 mg  Most recent lipid panel:     Component  Value Date/Time   CHOL 103 01/25/2021 0812   CHOL 98 05/14/2019 1300   TRIG 101 01/25/2021 0812   TRIG 205 (H) 05/14/2019 1300   HDL 32 (L) 01/25/2021 0812   CHOLHDL 3.2 01/25/2021 0812   VLDL 41 (H) 05/14/2019 1300   LDLCALC 52 01/25/2021 0812   ASCVD risk enhancing conditions: age >59, pre-DM, HTN, CKD, former smoker Unable to independently CAD and HLD Lacks social  connections Does not contact provider office for questions/concerns RN Care Manager Clinical Goal(s):  patient Ricky work with Consulting civil engineer, providers, and care team towards execution of optimized self-health management plan patient Ricky verbalize understanding of plan for effective management of CAD and HLD patient Ricky work with [facility] and pcp to address needs related to effective management of HLD and CAD patient Ricky demonstrate a decrease in CAD and HLD  exacerbations patient Ricky take all medications exactly as prescribed and Ricky call provider for medication related questions patient Ricky attend all scheduled medical appointments: 06-02-2021 at 0820 am patient Ricky demonstrate improved health management independence patient Ricky demonstrate understanding of rationale for each prescribed medication the patient Ricky demonstrate ongoing self health care management ability Interventions: Collaboration with Charlynne Cousins, MD regarding development and update of comprehensive plan of care as evidenced by provider attestation and co-signature Inter-disciplinary care team collaboration (see longitudinal plan of care) Medication review performed; medication list updated in electronic medical record.  Inter-disciplinary care team collaboration (see longitudinal plan of care) Referred to pharmacy team for assistance with HLD and CAD medication management Evaluation of current treatment plan related to CAD and HLD  and patient's adherence to plan as established by provider. Advised patient to call the office for changes in condition, questions, or new concerns Reviewed medications with patient and discussed compliance  Reviewed scheduled/upcoming provider appointments including: 06-02-2021 at 0820 am Discussed plans with patient for ongoing care management follow up and provided patient with direct contact information for care management team Patient Goals/Self-Care Activities: - call for medicine  refill 2 or 3 days before it runs out - call if I am sick and can't take my medicine - keep a list of all the medicines I take; vitamins and herbals too - learn to read medicine labels - use a pillbox to sort medicine - use an alarm clock or phone to remind me to take my medicine - change to whole grain breads, cereal, pasta - drink 6 to 8 glasses of water each day - eat 3 to 5 servings of fruits and vegetables each day - eat 5 or 6 small meals each day - eat fish at least once per week - fill half the plate with nonstarchy vegetables - limit fast food meals to no more than 1 per week - manage portion size - prepare main meal at home 3 to 5 days each week - read food labels for fat, fiber, carbohydrates and portion size - reduce red meat to 2 to 3 times a week - switch to sugar-free drinks - be open to making changes - I can manage, know and watch for signs of a heart attack - if I have chest pain, call for help - learn about small changes that Ricky make a big difference - learn my personal risk factors  Follow Up Plan: Telephone follow up appointment with care management team member scheduled for: 06-10-2021 at 230 pm      Task: RNCM: Alleviate Barriers to Coronary Artery Disease Therapy Completed 04/08/2021  Outcome: Positive  Note:   Care Management Activities:    - barriers to treatment adherence reviewed and addressed - communicable disease prevention promoted - functional limitation screening reviewed - healthy lifestyle promoted - rescue (action) plan developed - response to pharmacologic therapy monitored - self-awareness of signs/symptoms of worsening disease encouraged        Care Plan : RNCM: Hypertension (Adult)  Updates made by Vanita Ingles since 04/08/2021 12:00 AM     Problem: RNCM: Hypertension (Hypertension)   Priority: Medium     Long-Range Goal: RNCM: Hypertension Monitored   Start Date: 10/13/2020  Expected End Date: 04/03/2022  This Visit's  Progress: On track  Priority: Medium  Note:   Objective:  Last practice recorded BP readings:  BP Readings from Last 3 Encounters:  03/03/21 130/75  02/24/21 134/72  01/03/21 (!) 151/77   Most recent eGFR/CrCl:  Lab Results  Component Value Date   EGFR 41 (L) 01/25/2021    No components found for: CRCL Current Barriers:  Knowledge Deficits related to basic understanding of hypertension pathophysiology and self care management Knowledge Deficits related to understanding of medications prescribed for management of hypertension Unable to independently to effectively manage HTN Does not contact provider office for questions/concerns Case Manager Clinical Goal(s):  patient Ricky verbalize understanding of plan for hypertension management patient Ricky attend all scheduled medical appointments: 06-02-2021 at 0820 am patient Ricky demonstrate improved adherence to prescribed treatment plan for hypertension as evidenced by taking all medications as prescribed, monitoring and recording blood pressure as directed, adhering to low sodium/DASH diet patient Ricky demonstrate improved health management independence as evidenced by checking blood pressure as directed and notifying PCP if SBP>160 or DBP > 90, taking all medications as prescribe, and adhering to a low sodium diet as discussed. patient Ricky verbalize basic understanding of hypertension disease process and self health management plan as evidenced by compliance with medications, compliance with heart healthy diet, and working with the CCM team to effectively manage health and well being.  Interventions:  Collaboration with Charlynne Cousins, MD regarding development and update of comprehensive plan of care as evidenced by provider attestation and co-signature Inter-disciplinary care team collaboration (see longitudinal plan of care) Evaluation of current treatment plan related to hypertension self management and patient's adherence to plan as  established by provider. Provided education to patient re: stroke prevention, s/s of heart attack and stroke, DASH diet, complications of uncontrolled blood pressure Reviewed medications with patient and discussed importance of compliance Discussed plans with patient for ongoing care management follow up and provided patient with direct contact information for care management team Advised patient, providing education and rationale, to monitor blood pressure daily and record, calling PCP for findings outside established parameters.  Reviewed scheduled/upcoming provider appointments including: 06-02-2021 at 0820 am Self-Care Activities: - Self administers medications as prescribed Attends all scheduled provider appointments Calls provider office for new concerns, questions, or BP outside discussed parameters Checks BP and records as discussed Follows a low sodium diet/DASH diet Patient Goals: - check blood pressure 3 times per week - choose a place to take my blood pressure (home, clinic or office, retail store) - write blood pressure results in a log or diary - agree on reward when goals are met - agree to work together to make changes - ask questions to understand - have a family meeting to talk about healthy habits - learn about high blood pressure  Follow Up Plan: Telephone follow up appointment with care management team member scheduled for:  06-10-2021 at 230 pm    Task: RNCM: Identify and Monitor Blood Pressure Elevation Completed 04/08/2021  Outcome: Positive  Note:   Care Management Activities:    - blood pressure trends reviewed - depression screen reviewed - home or ambulatory blood pressure monitoring encouraged         Plan:Telephone follow up appointment with care management team member scheduled for:  06-10-2021 at 230 pm  Wiggins, MSN, [facility] Mobile: [phone]

## 2021-04-08 NOTE — Telephone Encounter (Signed)
  Chronic Care Management   Note  04/08/2021 Name: Ricky Melton MRN: [id number] DOB: Jul 29, 1951  Incoming call back from the patient. See new encounter.   Follow up plan: Telephone follow up appointment with care management team member scheduled for: 06-10-2021  Noreene Larsson RN, MSN, [facility] Mobile: [phone]

## 2021-04-13 ENCOUNTER — Other Ambulatory Visit: Payer: Self-pay

## 2021-04-13 ENCOUNTER — Ambulatory Visit
Admission: RE | Admit: 2021-04-13 | Discharge: 2021-04-13 | Disposition: A | Discharge status: Home / Self Care | Payer: Medicare Other | Source: Ambulatory Visit | Attending: Nephrology | Admitting: Nephrology

## 2021-04-13 DIAGNOSIS — N281 Cyst of kidney, acquired: Secondary | ICD-10-CM | POA: Diagnosis not present

## 2021-04-13 DIAGNOSIS — N1832 Chronic kidney disease, stage 3b: Secondary | ICD-10-CM

## 2021-05-03 NOTE — Chronic Care Management (AMB) (Signed)
  Care Management   Note  05/03/2021 Name: ANZIO OREJEL MRN: [id number] DOB: 1951-03-24  Ricky Melton is a 70 y.o. year old male who is a primary care patient of Vigg, Avanti, MD and is actively engaged with the care management team. I reached out to Curt Bears by phone today to assist with re-scheduling a follow up visit with the RN Case Manager  Follow up plan: Telephone appointment with care management team member scheduled for:06/17/2021  Noreene Larsson, [place], [place], [place] [address] Direct Dial: [phone] [email or web address] Website: [email or web address]

## 2021-05-24 ENCOUNTER — Other Ambulatory Visit: Payer: Self-pay

## 2021-05-24 ENCOUNTER — Other Ambulatory Visit: Payer: Medicare Other

## 2021-05-24 DIAGNOSIS — E785 Hyperlipidemia, unspecified: Secondary | ICD-10-CM

## 2021-05-24 DIAGNOSIS — E039 Hypothyroidism, unspecified: Secondary | ICD-10-CM | POA: Diagnosis not present

## 2021-05-24 DIAGNOSIS — M1000 Idiopathic gout, unspecified site: Secondary | ICD-10-CM

## 2021-05-24 DIAGNOSIS — I10 Essential (primary) hypertension: Secondary | ICD-10-CM

## 2021-05-24 DIAGNOSIS — R7309 Other abnormal glucose: Secondary | ICD-10-CM | POA: Diagnosis not present

## 2021-05-24 DIAGNOSIS — N400 Enlarged prostate without lower urinary tract symptoms: Secondary | ICD-10-CM

## 2021-05-24 LAB — BAYER DCA HB A1C WAIVED: HB A1C (BAYER DCA - WAIVED): 5.5 % (ref 4.8–5.6)

## 2021-05-24 LAB — MICROALBUMIN, URINE WAIVED
Creatinine, Urine Waived: 100 mg/dL (ref 10–300)
Microalb, Ur Waived: 150 mg/L — ABNORMAL HIGH (ref 0–19)
Microalb/Creat Ratio: >300 mg/g — ABNORMAL HIGH (ref <30)

## 2021-05-25 LAB — COMPREHENSIVE METABOLIC PANEL
ALT: 14 IU/L (ref 0–44)
AST: 21 IU/L (ref 0–40)
Albumin/Globulin Ratio: 2 (ref 1.2–2.2)
Albumin: 4.5 g/dL (ref 3.8–4.8)
Alkaline Phosphatase: 68 IU/L (ref 44–121)
BUN/Creatinine Ratio: 18 (ref 10–24)
BUN: 24 mg/dL (ref 8–27)
Bilirubin Total: 0.4 mg/dL (ref 0.0–1.2)
CO2: 22 mmol/L (ref 20–29)
Calcium: 9.6 mg/dL (ref 8.6–10.2)
Chloride: 104 mmol/L (ref 96–106)
Creatinine, Ser: 1.37 mg/dL — ABNORMAL HIGH (ref 0.76–1.27)
Globulin, Total: 2.2 g/dL (ref 1.5–4.5)
Glucose: 86 mg/dL (ref 65–99)
Potassium: 4.9 mmol/L (ref 3.5–5.2)
Sodium: 139 mmol/L (ref 134–144)
Total Protein: 6.7 g/dL (ref 6.0–8.5)
eGFR: 55 mL/min/{1.73_m2} — ABNORMAL LOW (ref >59)

## 2021-05-25 LAB — CBC WITH DIFFERENTIAL/PLATELET
Basophils Absolute: 0.1 10*3/uL (ref 0.0–0.2)
Basos: 1 %
EOS (ABSOLUTE): 0.2 10*3/uL (ref 0.0–0.4)
Eos: 3 %
Hematocrit: 43.6 % (ref 37.5–51.0)
Hemoglobin: 14 g/dL (ref 13.0–17.7)
Immature Grans (Abs): 0 10*3/uL (ref 0.0–0.1)
Immature Granulocytes: 0 %
Lymphocytes Absolute: 2.3 10*3/uL (ref 0.7–3.1)
Lymphs: 33 %
MCH: 28.1 pg (ref 26.6–33.0)
MCHC: 32.1 g/dL (ref 31.5–35.7)
MCV: 88 fL (ref 79–97)
Monocytes Absolute: 0.7 10*3/uL (ref 0.1–0.9)
Monocytes: 9 %
Neutrophils Absolute: 3.7 10*3/uL (ref 1.4–7.0)
Neutrophils: 54 %
Platelets: 200 10*3/uL (ref 150–450)
RBC: 4.98 x10E6/uL (ref 4.14–5.80)
RDW: 13.9 % (ref 11.6–15.4)
WBC: 7 10*3/uL (ref 3.4–10.8)

## 2021-05-25 LAB — THYROID PANEL WITH TSH
Free Thyroxine Index: 2.2 (ref 1.2–4.9)
T3 Uptake Ratio: 28 % (ref 24–39)
T4, Total: 8 ug/dL (ref 4.5–12.0)
TSH: 2.83 u[IU]/mL (ref 0.450–4.500)

## 2021-05-25 LAB — LIPID PANEL
Chol/HDL Ratio: 2.9 ratio (ref 0.0–5.0)
Cholesterol, Total: 97 mg/dL — ABNORMAL LOW (ref 100–199)
HDL: 34 mg/dL — ABNORMAL LOW (ref >39)
LDL Chol Calc (NIH): 46 mg/dL (ref 0–99)
Triglycerides: 86 mg/dL (ref 0–149)
VLDL Cholesterol Cal: 17 mg/dL (ref 5–40)

## 2021-05-25 LAB — URIC ACID: Uric Acid: 4.7 mg/dL (ref 3.8–8.4)

## 2021-05-25 LAB — PSA TOTAL+% FREE (SERIAL)
PSA, Free Pct: 27.6 %
PSA, Free: 0.58 ng/mL
Prostate Specific Ag, Serum: 2.1 ng/mL (ref 0.0–4.0)

## 2021-06-02 ENCOUNTER — Ambulatory Visit: Payer: Medicare Other | Admitting: Internal Medicine

## 2021-06-02 ENCOUNTER — Telehealth: Payer: Self-pay

## 2021-06-02 ENCOUNTER — Other Ambulatory Visit: Payer: Self-pay

## 2021-06-02 DIAGNOSIS — I10 Essential (primary) hypertension: Secondary | ICD-10-CM | POA: Diagnosis not present

## 2021-06-02 DIAGNOSIS — M1000 Idiopathic gout, unspecified site: Secondary | ICD-10-CM | POA: Diagnosis not present

## 2021-06-02 DIAGNOSIS — R8290 Unspecified abnormal findings in urine: Secondary | ICD-10-CM | POA: Diagnosis not present

## 2021-06-02 DIAGNOSIS — N1832 Chronic kidney disease, stage 3b: Secondary | ICD-10-CM | POA: Diagnosis not present

## 2021-06-02 NOTE — Telephone Encounter (Signed)
Patient called stating he had lab work done on about a week or two ago and he has not heard from the provider with his results and he would like to have them reviewed and resulted if possible. Patient is concerned due to not hearing back from anyone. Would any of you be able to review them and result them and I can reach back out to the patient or he said we could even mail his results once resulted. Please advise?

## 2021-06-02 NOTE — Telephone Encounter (Signed)
Copied from [place] [id number]. Topic: General - Other >> Jun 02, 2021  2:38 PM Pawlus, Brayton Layman A wrote: Reason for CRM: Pt requested a call back to go over her most recent lab results, please advise.

## 2021-06-02 NOTE — Telephone Encounter (Signed)
His kidney function has improved. His creatinine is down to 1.37. His cholesterol is very well controlled. His thyroid, prostate, and blood counts are all normal. His labs look great overall.

## 2021-06-02 NOTE — Telephone Encounter (Signed)
Patient notified and verbalized understanding and has no further questions at this time.

## 2021-06-08 DIAGNOSIS — N281 Cyst of kidney, acquired: Secondary | ICD-10-CM | POA: Diagnosis not present

## 2021-06-08 DIAGNOSIS — R809 Proteinuria, unspecified: Secondary | ICD-10-CM | POA: Diagnosis not present

## 2021-06-08 DIAGNOSIS — I10 Essential (primary) hypertension: Secondary | ICD-10-CM | POA: Diagnosis not present

## 2021-06-08 DIAGNOSIS — M1000 Idiopathic gout, unspecified site: Secondary | ICD-10-CM | POA: Diagnosis not present

## 2021-06-08 DIAGNOSIS — N1832 Chronic kidney disease, stage 3b: Secondary | ICD-10-CM | POA: Diagnosis not present

## 2021-06-08 DIAGNOSIS — R8290 Unspecified abnormal findings in urine: Secondary | ICD-10-CM | POA: Diagnosis not present

## 2021-06-10 ENCOUNTER — Telehealth: Payer: Self-pay

## 2021-06-16 DIAGNOSIS — I2510 Atherosclerotic heart disease of native coronary artery without angina pectoris: Secondary | ICD-10-CM | POA: Diagnosis not present

## 2021-06-16 DIAGNOSIS — I10 Essential (primary) hypertension: Secondary | ICD-10-CM | POA: Diagnosis not present

## 2021-06-16 DIAGNOSIS — E782 Mixed hyperlipidemia: Secondary | ICD-10-CM | POA: Diagnosis not present

## 2021-06-17 ENCOUNTER — Telehealth: Payer: Medicare Other | Admitting: General Practice

## 2021-06-17 ENCOUNTER — Ambulatory Visit (INDEPENDENT_AMBULATORY_CARE_PROVIDER_SITE_OTHER): Payer: Medicare Other

## 2021-06-17 DIAGNOSIS — I2510 Atherosclerotic heart disease of native coronary artery without angina pectoris: Secondary | ICD-10-CM

## 2021-06-17 DIAGNOSIS — E785 Hyperlipidemia, unspecified: Secondary | ICD-10-CM

## 2021-06-17 DIAGNOSIS — I2583 Coronary atherosclerosis due to lipid rich plaque: Secondary | ICD-10-CM

## 2021-06-17 DIAGNOSIS — I10 Essential (primary) hypertension: Secondary | ICD-10-CM

## 2021-06-17 NOTE — Patient Instructions (Signed)
Visit Information  PATIENT GOALS:  Goals Addressed             This Visit's Progress    RNCM: Manage My Diet       Timeframe:  Long-Range Goal Priority:  High Start Date:   04-08-2021                          Expected End Date:    04-08-2022                   Follow Up Date 08/26/2021    - ask for help if I have trouble affording healthy foods - choose foods low in fat and sugar - choose foods that are low in sodium (salt) - eat 2 servings of fruit/vegetables every day - eat 3 to 5 servings of fruits and vegetables each day - keep a food diary - prepare or eat main meal at home 3 to 5 days each week - keep healthy snacks on hand - meet with dietitian - read food labels for sodium (salt), fat and sugar content - take my supplements as prescribed - track weight in diary - watch for swelling in feet, ankles and legs every day    Why is this important?   A healthy diet is important for mental and physical health.  Healthy food helps repair damaged body tissue and maintains strong bones and muscles.  No single food is just right so eating a variety of proteins, fruits, vegetables and grains is best.  You may need to change what you eat or drink to manage kidney disease.  A dietitian is the best person to guide you.     Notes: 04-08-2021: The patient is losing weight and has cut out sodas. The patient states he is taking ownership of his health and well being. 06-17-2021: The patient has lost down to 213 from 257. The patient walks 1 to 5 miles daily. States he is not drinking sodas and drinking water only. He is watching what he eats. Denies any acute distress. States he feels really good. He knows he has made positive changes for his health and well being.      RNCM: Track and Manage My Symptoms-Chronic Kidney       Timeframe:  Long-Range Goal Priority:  High Start Date:    04-08-2021                         Expected End Date:       04-08-2022                Follow Up Date  08/26/2021    - balance periods of rest with activity as needed - begin a symptom diary - bring symptom diary to all appointments - develop a new routine to improve sleep - don't eat or exercise right before bedtime - exercise at least 2 to 3 times per week - get outdoors every day (weather permitting) - join a support group - keep all lab appointments - keep room cool and dark - make shared treatment decisions with doctor - pace activity allowing for rest - take a warm shower or bath before bed - take medicine as prescribed - use a fan or white noise in bedroom - use mild soap and lotion on itchy skin - watch for early signs of feeling worse    Why is this important?   Keeping track of  symptoms can help you feel the best. It also helps the doctor stay on top of any changes to the disease. It may also help keep your disease from getting worse.  Taking simple steps can help you cope with symptoms like feeling very tired or itchy skin.     Notes: 04-08-2021: The patient is watching his dietary habits and exercising now. He wants to make positive changes with his health and well being. The patient will have an ultrasound next week. Had labwork form specialist this week. GFR is 54%. Education and support given. 06-17-2021: Saw kidney specialist recently and got a good report. Lab work from [employer]. The patient did have elevation of blood pressure in the office but after he sat in the office for a while he said his blood pressure was 130/70. The patient states he gets a little excited about his doctor visits sometimes. The same thing happened recently when he went to the cardiologist. Denies any acute findings.         The patient verbalized understanding of instructions, educational materials, and care plan provided today and declined offer to receive copy of patient instructions, educational materials, and care plan.   Telephone follow up appointment with care management team member  scheduled for: 08-26-2021 at 145 pm  Noreene Larsson RN, MSN, [facility] Mobile: [phone]

## 2021-06-17 NOTE — Chronic Care Management (AMB) (Signed)
Chronic Care Management   CCM RN Visit Note  06/17/2021 Name: Ricky Melton MRN: [id number] DOB: Jun 13, 1951  Subjective: Ricky Melton is a 70 y.o. year old male who is a primary care patient of Vigg, Avanti, MD. The care management team was consulted for assistance with disease management and care coordination needs.    Engaged with patient by telephone for follow up visit in response to provider referral for case management and/or care coordination services.   Consent to Services:  The patient was given information about Chronic Care Management services, agreed to services, and gave verbal consent prior to initiation of services.  Please see initial visit note for detailed documentation.   Patient agreed to services and verbal consent obtained.   Assessment: Review of patient past medical history, allergies, medications, health status, including review of consultants reports, laboratory and other test data, was performed as part of comprehensive evaluation and provision of chronic care management services.   SDOH (Social Determinants of Health) assessments and interventions performed:    CCM Care Plan  No Known Allergies  Outpatient Encounter Medications as of 06/17/2021  Medication Sig   albuterol (VENTOLIN HFA) 108 (90 Base) MCG/ACT inhaler    allopurinol (ZYLOPRIM) 300 MG tablet TAKE 1 TABLET BY MOUTH  DAILY   amLODipine (NORVASC) 5 MG tablet TAKE 1 TABLET (5 MG TOTAL) BY MOUTH DAILY.   aspirin 81 MG tablet Take 81 mg by mouth daily.   benazepril (LOTENSIN) 20 MG tablet Take 1 tablet (20 mg total) by mouth daily.   carvedilol (COREG) 25 MG tablet TAKE 1 TABLET BY MOUTH  TWICE DAILY WITH A MEAL   clopidogrel (PLAVIX) 75 MG tablet Take 1 tablet (75 mg total) by mouth daily.   fexofenadine (ALLEGRA ALLERGY) 180 MG tablet Take 1 tablet (180 mg total) by mouth daily.   fluticasone (FLONASE) 50 MCG/ACT nasal spray Place 2 sprays into both nostrils daily.   levothyroxine (SYNTHROID)  50 MCG tablet TAKE 1 TABLET BY MOUTH  DAILY   pantoprazole (PROTONIX) 40 MG tablet Take 1 tablet by mouth daily.   rosuvastatin (CRESTOR) 40 MG tablet Take 1 tablet (40 mg total) by mouth daily.   No facility-administered encounter medications on file as of 06/17/2021.    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   CAD (coronary artery disease) 03/23/2015   Benign hematuria 03/23/2015   Hypertension 03/23/2015   Hyperlipidemia 03/23/2015   Hypothyroidism 03/23/2015   Gout 03/23/2015    Conditions to be addressed/monitored:CAD, HTN, and HLD  Care Plan : RNCM: Coronary Artery Disease (Adult) and HLD  Updates made by Vanita Ingles, RN since 06/17/2021 12:00 AM     Problem: RNCM: Disease Progression (Coronary Artery Disease)   Priority: Medium     Long-Range Goal: RNCM: Disease Progression Prevented or Minimized   Start Date: 10/13/2020  Expected End Date: 04/03/2022  This Visit's Progress: On track  Recent Progress: On track  Priority: Medium  Note:   Current Barriers:  Poorly controlled hyperlipidemia, complicated by ENT issue- COVID negative, HTN Current antihyperlipidemic regimen:Crestor 40 mg  Most recent lipid panel:     Component Value Date/Time   CHOL 97 (L) 05/24/2021 0823   CHOL 98 05/14/2019 1300   TRIG 86 05/24/2021 0823   TRIG 205 (H)  05/14/2019 1300   HDL 34 (L) 05/24/2021 0823   CHOLHDL 2.9 05/24/2021 0823   VLDL 41 (H) 05/14/2019 1300   LDLCALC 46 05/24/2021 0823   ASCVD risk enhancing conditions: age >13, pre-DM, HTN, CKD, former smoker Unable to independently CAD and HLD Lacks social connections Does not contact provider office for questions/concerns RN Care Manager Clinical Goal(s):  patient will work with Consulting civil engineer,  providers, and care team towards execution of optimized self-health management plan patient will verbalize understanding of plan for effective management of CAD and HLD patient will work with [facility] and pcp to address needs related to effective management of HLD and CAD patient will demonstrate a decrease in CAD and HLD  exacerbations patient will take all medications exactly as prescribed and will call provider for medication related questions patient will attend all scheduled medical appointments: 07-07-2021 at 0820 am patient will demonstrate improved health management independence patient will demonstrate understanding of rationale for each prescribed medication the patient will demonstrate ongoing self health care management ability Interventions: Collaboration with Charlynne Cousins, MD regarding development and update of comprehensive plan of care as evidenced by provider attestation and co-signature Inter-disciplinary care team collaboration (see longitudinal plan of care) Medication review performed; medication list updated in electronic medical record. 06-17-2021: The patient states that he is doing well and denies any issues with his medications at this time.  Inter-disciplinary care team collaboration (see longitudinal plan of care) Referred to pharmacy team for assistance with HLD and CAD medication management Evaluation of current treatment plan related to CAD and HLD  and patient's adherence to plan as established by provider. 06-17-2021: The patient states he is doing well. He has changed his dietary habits and cut out sodas and sweets. He is walking 1 to 5 miles a day and has lost down to 213 pounds. He is sleeping well and feels much better. Praised the patient for positive changes in his health and well being. Will continue to monitor for changes.  Advised patient to call the office for changes in condition, questions, or new concerns Reviewed medications with patient and discussed  compliance. 06-17-2021: Is compliant with medications. Reviewed scheduled/upcoming provider appointments including: 07-07-2021 at 0820 am Discussed plans with patient for ongoing care management follow up and provided patient with direct contact information for care management team Patient Goals/Self-Care Activities: - call for medicine refill 2 or 3 days before it runs out - call if I am sick and can't take my medicine - keep a list of all the medicines I take; vitamins and herbals too - learn to read medicine labels - use a pillbox to sort medicine - use an alarm clock or phone to remind me to take my medicine - change to whole grain breads, cereal, pasta - drink 6 to 8 glasses of water each day - eat 3 to 5 servings of fruits and vegetables each day - eat 5 or 6 small meals each day - eat fish at least once per week - fill half the plate with nonstarchy vegetables - limit fast food meals to no more than 1 per week - manage portion size - prepare main meal at home 3 to 5 days each week - read food labels for fat, fiber, carbohydrates and portion size - reduce red meat to 2 to 3 times a week - switch to sugar-free drinks - be open to making changes - I can manage, know and watch for signs of a heart attack - if I  have chest pain, call for help - learn about small changes that will make a big difference - learn my personal risk factors  Follow Up Plan: Telephone follow up appointment with care management team member scheduled for: 08-26-2021 at 145 pm      Care Plan : RNCM: Hypertension (Adult)  Updates made by Vanita Ingles, RN since 06/17/2021 12:00 AM     Problem: RNCM: Hypertension (Hypertension)   Priority: Medium     Long-Range Goal: RNCM: Hypertension Monitored   Start Date: 10/13/2020  Expected End Date: 04/03/2022  This Visit's Progress: On track  Recent Progress: On track  Priority: Medium  Note:   Objective:  Last practice recorded BP readings:  BP Readings  from Last 3 Encounters:  03/03/21 130/75  02/24/21 134/72  01/03/21 (!) 151/77  06-08-2021 164/82 at kidney specialist but after being there a while retook and it was 130/70 Most recent eGFR/CrCl:  Lab Results  Component Value Date   EGFR 55 (L) 05/24/2021    No components found for: CRCL Current Barriers:  Knowledge Deficits related to basic understanding of hypertension pathophysiology and self care management Knowledge Deficits related to understanding of medications prescribed for management of hypertension Unable to independently to effectively manage HTN Does not contact provider office for questions/concerns Case Manager Clinical Goal(s):  patient will verbalize understanding of plan for hypertension management patient will attend all scheduled medical appointments: 07-07-2021 at 0820 am patient will demonstrate improved adherence to prescribed treatment plan for hypertension as evidenced by taking all medications as prescribed, monitoring and recording blood pressure as directed, adhering to low sodium/DASH diet patient will demonstrate improved health management independence as evidenced by checking blood pressure as directed and notifying PCP if SBP>160 or DBP > 90, taking all medications as prescribe, and adhering to a low sodium diet as discussed. patient will verbalize basic understanding of hypertension disease process and self health management plan as evidenced by compliance with medications, compliance with heart healthy diet, and working with the CCM team to effectively manage health and well being.  Interventions:  Collaboration with Charlynne Cousins, MD regarding development and update of comprehensive plan of care as evidenced by provider attestation and co-signature Inter-disciplinary care team collaboration (see longitudinal plan of care) Evaluation of current treatment plan related to hypertension self management and patient's adherence to plan as established by provider.  06-17-2021: The patient is doing well with his blood pressures. States that they are a little elevated when he to the doctor but after being there they come down. He is feeling great and denies any new concerns Provided education to patient re: stroke prevention, s/s of heart attack and stroke, DASH diet, complications of uncontrolled blood pressure. 06-17-2021: Making positive changes in his health and well being. Has lost weight, is walking daily, is eating healthier, cut out sodas, and sweets. The patient states he is very happy with his current health and well being. Praised for accomplishments.  Reviewed medications with patient and discussed importance of compliance. 06-17-2021: Is compliant with medications.  Discussed plans with patient for ongoing care management follow up and provided patient with direct contact information for care management team Advised patient, providing education and rationale, to monitor blood pressure daily and record, calling PCP for findings outside established parameters.  Reviewed scheduled/upcoming provider appointments including: 07-07-2021 at 0840 am Self-Care Activities: - Self administers medications as prescribed Attends all scheduled provider appointments Calls provider office for new concerns, questions, or BP outside discussed parameters Checks BP  and records as discussed Follows a low sodium diet/DASH diet Patient Goals: - check blood pressure 3 times per week - choose a place to take my blood pressure (home, clinic or office, retail store) - write blood pressure results in a log or diary - agree on reward when goals are met - agree to work together to make changes - ask questions to understand - have a family meeting to talk about healthy habits - learn about high blood pressure  Follow Up Plan: Telephone follow up appointment with care management team member scheduled for: 08-26-2021 at 145  pm     Plan:Telephone follow up appointment with care  management team member scheduled for:  08-26-2021 at 145 pm  [place], MSN, [facility] Mobile: [phone]

## 2021-06-26 ENCOUNTER — Other Ambulatory Visit: Payer: Self-pay | Admitting: Internal Medicine

## 2021-06-26 DIAGNOSIS — E785 Hyperlipidemia, unspecified: Secondary | ICD-10-CM

## 2021-06-26 DIAGNOSIS — I10 Essential (primary) hypertension: Secondary | ICD-10-CM

## 2021-06-27 NOTE — Telephone Encounter (Signed)
Upcoming appointment 07/07/21 Requested Prescriptions  Pending Prescriptions Disp Refills  . clopidogrel (PLAVIX) 75 MG tablet [Pharmacy Med Name: Clopidogrel Bisulfate 75 MG Oral Tablet] 90 tablet 0    Sig: TAKE 1 TABLET BY MOUTH  DAILY     Hematology: Antiplatelets - clopidogrel Failed - 06/26/2021 11:04 PM      Failed - Evaluate AST, ALT within 2 months of therapy initiation.      Passed - ALT in normal range and within 360 days    ALT  Date Value Ref Range Status  05/24/2021 14 0 - 44 IU/L Final   ALT (SGPT) Piccolo, Waived  Date Value Ref Range Status  05/14/2019 22 10 - 47 U/L Final         Passed - AST in normal range and within 360 days    AST  Date Value Ref Range Status  05/24/2021 21 0 - 40 IU/L Final   AST (SGOT) Piccolo, Waived  Date Value Ref Range Status  05/14/2019 33 11 - 38 U/L Final         Passed - HCT in normal range and within 180 days    Hematocrit  Date Value Ref Range Status  05/24/2021 43.6 37.5 - 51.0 % Final         Passed - HGB in normal range and within 180 days    Hemoglobin  Date Value Ref Range Status  05/24/2021 14.0 13.0 - 17.7 g/dL Final         Passed - PLT in normal range and within 180 days    Platelets  Date Value Ref Range Status  05/24/2021 200 150 - 450 x10E3/uL Final         Passed - Valid encounter within last 6 months    Recent Outpatient Visits          3 months ago Hypothyroidism, unspecified type   [facility] Vigg, Avanti, MD   4 months ago Essential hypertension   [place] Vigg, Avanti, MD   4 months ago Elevated serum creatinine   [facility] Vigg, Avanti, MD   5 months ago Elevated fasting blood sugar   [facility] Vigg, Avanti, MD   6 months ago Screening for malignant neoplasm of prostate   [place], MD      Future Appointments            In 1 week Vigg, Avanti, MD [place], [facility]   In 6 months   [facility], [place]           . rosuvastatin (CRESTOR) 40 MG tablet [Pharmacy Med Name: Rosuvastatin Calcium 40 MG Oral Tablet] 90 tablet 0    Sig: TAKE 1 TABLET BY MOUTH  DAILY     Cardiovascular:  Antilipid - Statins Failed - 06/26/2021 11:04 PM      Failed - Total Cholesterol in normal range and within 360 days    Cholesterol, Total  Date Value Ref Range Status  05/24/2021 97 (L) 100 - 199 mg/dL Final   Cholesterol Piccolo, Waived  Date Value Ref Range Status  05/14/2019 98 <200 mg/dL Final    Comment:                            Desirable                <200  Borderline High      200- 239                         High                     >239          Failed - HDL in normal range and within 360 days    HDL  Date Value Ref Range Status  05/24/2021 34 (L) >39 mg/dL Final         Passed - LDL in normal range and within 360 days    LDL Chol Calc (NIH)  Date Value Ref Range Status  05/24/2021 46 0 - 99 mg/dL Final         Passed - Triglycerides in normal range and within 360 days    Triglycerides  Date Value Ref Range Status  05/24/2021 86 0 - 149 mg/dL Final   Triglycerides Piccolo,Waived  Date Value Ref Range Status  05/14/2019 205 (H) <150 mg/dL Final    Comment:                            Normal                   <150                         Borderline High     150 - 199                         High                200 - 499                         Very High                >499          Passed - Patient is not pregnant      Passed - Valid encounter within last 12 months    Recent Outpatient Visits          3 months ago Hypothyroidism, unspecified type   [facility] Vigg, Avanti, MD   4 months ago Essential hypertension   [place] Vigg, Avanti, MD   4 months ago Elevated serum creatinine   [facility] Vigg, Avanti, MD   5 months ago Elevated fasting blood sugar   [facility] Vigg, Avanti, MD   6 months ago Screening for malignant neoplasm of prostate   [facility] Vigg, Avanti, MD      Future Appointments            In 1 week Vigg, Avanti, MD [place], [facility]   In 6 months  [facility], [facility]           . carvedilol (COREG) 25 MG tablet [Pharmacy Med Name: Carvedilol 25 MG Oral Tablet] 180 tablet 0    Sig: TAKE 1 TABLET BY MOUTH  TWICE DAILY WITH A MEAL     Cardiovascular:  Beta Blockers Passed - 06/26/2021 11:04 PM      Passed - Last BP in normal range    BP Readings from Last 1 Encounters:  03/03/21 130/75  Passed - Last Heart Rate in normal range    Pulse Readings from Last 1 Encounters:  03/03/21 (!) 55         Passed - Valid encounter within last 6 months    Recent Outpatient Visits          3 months ago Hypothyroidism, unspecified type   [facility] Vigg, Avanti, MD   4 months ago Essential hypertension   [place] Vigg, Avanti, MD   4 months ago Elevated serum creatinine   [facility] Vigg, Avanti, MD   5 months ago Elevated fasting blood sugar   [place] Vigg, Avanti, MD   6 months ago Screening for malignant neoplasm of prostate   [place] Vigg, Avanti, MD      Future Appointments            In 1 week Vigg, Avanti, MD [facility], [place]   In 6 months  [facility], [place]

## 2021-07-07 ENCOUNTER — Encounter: Payer: Self-pay | Admitting: Internal Medicine

## 2021-07-07 ENCOUNTER — Ambulatory Visit (INDEPENDENT_AMBULATORY_CARE_PROVIDER_SITE_OTHER): Payer: Medicare Other | Admitting: Internal Medicine

## 2021-07-07 ENCOUNTER — Other Ambulatory Visit: Payer: Self-pay

## 2021-07-07 VITALS — BP 128/60 | HR 55 | Temp 98.4°F | Ht 67.0 in | Wt 220.2 lb

## 2021-07-07 DIAGNOSIS — R7303 Prediabetes: Secondary | ICD-10-CM | POA: Diagnosis not present

## 2021-07-07 DIAGNOSIS — I10 Essential (primary) hypertension: Secondary | ICD-10-CM | POA: Diagnosis not present

## 2021-07-07 DIAGNOSIS — Z23 Encounter for immunization: Secondary | ICD-10-CM | POA: Insufficient documentation

## 2021-07-07 MED ORDER — FEXOFENADINE HCL 180 MG PO TABS: 180.0000 mg | ORAL_TABLET | Freq: Every day | ORAL | 1 refills | Status: DC

## 2021-07-07 MED ORDER — AMLODIPINE BESYLATE 10 MG PO TABS: 10.0000 mg | ORAL_TABLET | Freq: Every evening | ORAL | 3 refills | Status: DC

## 2021-07-07 NOTE — Progress Notes (Signed)
BP 128/60   Pulse (!) 55   Temp 98.4 F (36.9 C) (Oral)   Ht _0  (1.702 m)   Wt 220 lb 4 oz (99.9 kg)   SpO2 98%   BMI 34.50 kg/m    Subjective:    Patient ID: Ricky Melton, male    DOB: September 22, 1950, 70 y.o.   MRN: [id number]  Chief Complaint  Patient presents with   Hyperlipidemia   Hypothyroidism   Hypertension    Increased the Norvasc to 40m once a day.     HPI: Ricky Melton a 70y.o. male  Kidney function improving since last visit 01/25/2021 08:12 BUN: 28 (H) Creatinine: 1.75 (H)  01/25/2021 09:07  04/13/2021 14:25  05/24/2021 08:17  05/24/2021 08:23 BUN: 24 Creatinine: 1.37 (H)   Hyperlipidemia This is a chronic problem. The problem is controlled.  Hypertension This is a chronic (Blood pressure stable on current medications) problem. The problem is controlled. Pertinent negatives include no anxiety, blurred vision, malaise/fatigue, neck pain or orthopnea.   Chief Complaint  Patient presents with   Hyperlipidemia   Hypothyroidism   Hypertension    Increased the Norvasc to 148monce a day.     Relevant past medical, surgical, family and social history reviewed and updated as indicated. Interim medical history since our last visit reviewed. Allergies and medications reviewed and updated.  Review of Systems  Constitutional:  Negative for malaise/fatigue.  Eyes:  Negative for blurred vision.  Cardiovascular:  Negative for orthopnea.  Musculoskeletal:  Negative for neck pain.   Per HPI unless specifically indicated above     Objective:    BP 128/60   Pulse (!) 55   Temp 98.4 F (36.9 C) (Oral)   Ht _1  (1.702 m)   Wt 220 lb 4 oz (99.9 kg)   SpO2 98%   BMI 34.50 kg/m   Wt Readings from Last 3 Encounters:  07/07/21 220 lb 4 oz (99.9 kg)  03/03/21 234 lb 3.2 oz (106.2 kg)  02/24/21 234 lb 3.2 oz (106.2 kg)    Physical Exam Vitals and nursing note reviewed.  Constitutional:      General: He is not in acute distress.     Appearance: Normal appearance. He is not ill-appearing or diaphoretic.  HENT:     Head: Normocephalic and atraumatic.     Right Ear: Tympanic membrane and external ear normal. There is no impacted cerumen.     Left Ear: External ear normal.     Nose: No congestion or rhinorrhea.     Mouth/Throat:     Pharynx: No oropharyngeal exudate or posterior oropharyngeal erythema.  Eyes:     Conjunctiva/sclera: Conjunctivae normal.     Pupils: Pupils are equal, round, and reactive to light.  Cardiovascular:     Rate and Rhythm: Normal rate and regular rhythm.     Heart sounds: No murmur heard.   No friction rub. No gallop.  Pulmonary:     Effort: No respiratory distress.     Breath sounds: No stridor. No wheezing or rhonchi.  Chest:     Chest wall: No tenderness.  Abdominal:     General: Abdomen is flat. Bowel sounds are normal. There is no distension.     Palpations: Abdomen is soft. There is no mass.     Tenderness: There is no abdominal tenderness. There is no guarding.  Musculoskeletal:        General: No swelling or deformity.  Cervical back: Normal range of motion and neck supple. No rigidity or tenderness.     Right lower leg: No edema.     Left lower leg: No edema.  Skin:    General: Skin is warm and dry.     Coloration: Skin is not jaundiced.     Findings: No erythema.  Neurological:     Mental Status: He is alert and oriented to person, place, and time. Mental status is at baseline.  Psychiatric:        Mood and Affect: Mood normal.        Behavior: Behavior normal.        Thought Content: Thought content normal.        Judgment: Judgment normal.    Results for orders placed or performed in visit on 05/24/21  Lipid panel  Result Value Ref Range   Cholesterol, Total 97 (L) 100 - 199 mg/dL   Triglycerides 86 0 - 149 mg/dL   HDL 34 (L) >39 mg/dL   VLDL Cholesterol Cal 17 5 - 40 mg/dL   LDL Chol Calc (NIH) 46 0 - 99 mg/dL   Chol/HDL Ratio 2.9 0.0 - 5.0 ratio  Bayer  DCA Hb A1c Waived  Result Value Ref Range   HB A1C (BAYER DCA - WAIVED) 5.5 4.8 - 5.6 %  Thyroid Panel With TSH  Result Value Ref Range   TSH 2.830 0.450 - 4.500 uIU/mL   T4, Total 8.0 4.5 - 12.0 ug/dL   T3 Uptake Ratio 28 24 - 39 %   Free Thyroxine Index 2.2 1.2 - 4.9  CBC with Differential/Platelet  Result Value Ref Range   WBC 7.0 3.4 - 10.8 x10E3/uL   RBC 4.98 4.14 - 5.80 x10E6/uL   Hemoglobin 14.0 13.0 - 17.7 g/dL   Hematocrit 43.6 37.5 - 51.0 %   MCV 88 79 - 97 fL   MCH 28.1 26.6 - 33.0 pg   MCHC 32.1 31.5 - 35.7 g/dL   RDW 13.9 11.6 - 15.4 %   Platelets 200 150 - 450 x10E3/uL   Neutrophils 54 Not Estab. %   Lymphs 33 Not Estab. %   Monocytes 9 Not Estab. %   Eos 3 Not Estab. %   Basos 1 Not Estab. %   Neutrophils Absolute 3.7 1.4 - 7.0 x10E3/uL   Lymphocytes Absolute 2.3 0.7 - 3.1 x10E3/uL   Monocytes Absolute 0.7 0.1 - 0.9 x10E3/uL   EOS (ABSOLUTE) 0.2 0.0 - 0.4 x10E3/uL   Basophils Absolute 0.1 0.0 - 0.2 x10E3/uL   Immature Granulocytes 0 Not Estab. %   Immature Grans (Abs) 0.0 0.0 - 0.1 x10E3/uL  Comprehensive metabolic panel  Result Value Ref Range   Glucose 86 65 - 99 mg/dL   BUN 24 8 - 27 mg/dL   Creatinine, Ser 1.37 (H) 0.76 - 1.27 mg/dL   eGFR 55 (L) >59 mL/min/1.73   BUN/Creatinine Ratio 18 10 - 24   Sodium 139 134 - 144 mmol/L   Potassium 4.9 3.5 - 5.2 mmol/L   Chloride 104 96 - 106 mmol/L   CO2 22 20 - 29 mmol/L   Calcium 9.6 8.6 - 10.2 mg/dL   Total Protein 6.7 6.0 - 8.5 g/dL   Albumin 4.5 3.8 - 4.8 g/dL   Globulin, Total 2.2 1.5 - 4.5 g/dL   Albumin/Globulin Ratio 2.0 1.2 - 2.2   Bilirubin Total 0.4 0.0 - 1.2 mg/dL   Alkaline Phosphatase 68 44 - 121 IU/L   AST 21 0 -  40 IU/L   ALT 14 0 - 44 IU/L  PSA Total+%Free (Serial)  Result Value Ref Range   Prostate Specific Ag, Serum 2.1 0.0 - 4.0 ng/mL   PSA, Free 0.58 N/A ng/mL   PSA, Free Pct 27.6 %  Microalbumin, Urine Waived  Result Value Ref Range   Microalb, Ur Waived 150 (H) 0 - 19 mg/L    Creatinine, Urine Waived 100 10 - 300 mg/dL   Microalb/Creat Ratio >300 (H) <30 mg/g  Uric acid  Result Value Ref Range   Uric Acid 4.7 3.8 - 8.4 mg/dL        Current Outpatient Medications:    albuterol (VENTOLIN HFA) 108 (90 Base) MCG/ACT inhaler, , Disp: , Rfl:    allopurinol (ZYLOPRIM) 300 MG tablet, TAKE 1 TABLET BY MOUTH  DAILY, Disp: 90 tablet, Rfl: 3   amLODipine (NORVASC) 10 MG tablet, Take 10 mg by mouth daily., Disp: , Rfl:    aspirin 81 MG tablet, Take 81 mg by mouth daily., Disp: , Rfl:    benazepril (LOTENSIN) 20 MG tablet, Take 1 tablet (20 mg total) by mouth daily., Disp: 90 tablet, Rfl: 1   carvedilol (COREG) 25 MG tablet, TAKE 1 TABLET BY MOUTH  TWICE DAILY WITH A MEAL, Disp: 180 tablet, Rfl: 0   clopidogrel (PLAVIX) 75 MG tablet, TAKE 1 TABLET BY MOUTH  DAILY, Disp: 90 tablet, Rfl: 0   fexofenadine (ALLEGRA ALLERGY) 180 MG tablet, Take 1 tablet (180 mg total) by mouth daily., Disp: 10 tablet, Rfl: 1   fluticasone (FLONASE) 50 MCG/ACT nasal spray, Place 2 sprays into both nostrils daily., Disp: 16 g, Rfl: 6   levothyroxine (SYNTHROID) 50 MCG tablet, TAKE 1 TABLET BY MOUTH  DAILY, Disp: 90 tablet, Rfl: 3   pantoprazole (PROTONIX) 40 MG tablet, Take 1 tablet by mouth daily., Disp: , Rfl:    rosuvastatin (CRESTOR) 40 MG tablet, TAKE 1 TABLET BY MOUTH  DAILY (Patient taking differently: Take 40 mg by mouth daily.), Disp: 90 tablet, Rfl: 0    Assessment & Plan:  HTN is on noravac is on 10 mg , benazapril and coreg bid.  Continue current meds.  Medication compliance emphasised. pt advised to keep Bp logs. Pt verbalised understanding of the same. Pt to have a low salt diet . Exercise to reach a goal of at least 150 mins a week.  lifestyle modifications explained and pt understands importance of the above.  CAD s/p 2 stents.  sees Dr. Humphrey Rolls   3. Prediabetes better since weight loss.  Lifestyle modifications advised to pt. A1c at   Portion control and avoiding high carb low  fat diet advised.  Diet plan given to pt   exercise plan given and encouraged.  To increase exercise to 150 mins a week ie 21/2 hours a week. Pt verbalises understanding of the above.   4. Allergic rhinitis.  Will continue allegra.   Problem List Items Addressed This Visit   None Visit Diagnoses     Need for influenza vaccination    -  Primary   Relevant Orders   Flu Vaccine QUAD High Dose(Fluad)        Orders Placed This Encounter  Procedures   Flu Vaccine QUAD High Dose(Fluad)     No orders of the defined types were placed in this encounter.    Follow up plan: No follow-ups on file.

## 2021-07-11 DIAGNOSIS — I2510 Atherosclerotic heart disease of native coronary artery without angina pectoris: Secondary | ICD-10-CM

## 2021-07-11 DIAGNOSIS — R7303 Prediabetes: Secondary | ICD-10-CM | POA: Insufficient documentation

## 2021-07-11 DIAGNOSIS — I10 Essential (primary) hypertension: Secondary | ICD-10-CM | POA: Diagnosis not present

## 2021-07-11 DIAGNOSIS — I2583 Coronary atherosclerosis due to lipid rich plaque: Secondary | ICD-10-CM | POA: Diagnosis not present

## 2021-07-11 DIAGNOSIS — E785 Hyperlipidemia, unspecified: Secondary | ICD-10-CM

## 2021-07-14 ENCOUNTER — Ambulatory Visit (INDEPENDENT_AMBULATORY_CARE_PROVIDER_SITE_OTHER): Payer: Medicare Other

## 2021-07-14 ENCOUNTER — Other Ambulatory Visit: Payer: Self-pay

## 2021-07-14 DIAGNOSIS — Z23 Encounter for immunization: Secondary | ICD-10-CM | POA: Diagnosis not present

## 2021-07-14 NOTE — Progress Notes (Signed)
Verified insurance information with patient and advised patient that he may get a bill for this vaccine as Medicare prefers administration at the pharmacy. Patient verbalized understanding of this and wanted the vaccine anyway. Vaccine given to the patient, he tolerated it will.

## 2021-07-18 DIAGNOSIS — E782 Mixed hyperlipidemia: Secondary | ICD-10-CM | POA: Diagnosis not present

## 2021-07-18 DIAGNOSIS — I10 Essential (primary) hypertension: Secondary | ICD-10-CM | POA: Diagnosis not present

## 2021-07-18 DIAGNOSIS — R0602 Shortness of breath: Secondary | ICD-10-CM | POA: Diagnosis not present

## 2021-07-18 DIAGNOSIS — I2510 Atherosclerotic heart disease of native coronary artery without angina pectoris: Secondary | ICD-10-CM | POA: Diagnosis not present

## 2021-07-18 DIAGNOSIS — Z9861 Coronary angioplasty status: Secondary | ICD-10-CM | POA: Diagnosis not present

## 2021-07-21 ENCOUNTER — Other Ambulatory Visit: Payer: Self-pay | Admitting: Internal Medicine

## 2021-07-21 DIAGNOSIS — I10 Essential (primary) hypertension: Secondary | ICD-10-CM

## 2021-07-22 NOTE — Telephone Encounter (Signed)
Requested Prescriptions  Pending Prescriptions Disp Refills  . benazepril (LOTENSIN) 20 MG tablet [Pharmacy Med Name: Benazepril HCl 20 MG Oral Tablet] 90 tablet 1    Sig: TAKE 1 TABLET BY MOUTH  DAILY     Cardiovascular:  ACE Inhibitors Failed - 07/21/2021 11:21 PM      Failed - Cr in normal range and within 180 days    Creatinine, Ser  Date Value Ref Range Status  05/24/2021 1.37 (H) 0.76 - 1.27 mg/dL Final         Passed - K in normal range and within 180 days    Potassium  Date Value Ref Range Status  05/24/2021 4.9 3.5 - 5.2 mmol/L Final         Passed - Patient is not pregnant      Passed - Last BP in normal range    BP Readings from Last 1 Encounters:  07/07/21 128/60         Passed - Valid encounter within last 6 months    Recent Outpatient Visits          2 weeks ago Need for influenza vaccination   [facility] Vigg, Avanti, MD   4 months ago Hypothyroidism, unspecified type   [facility] Vigg, Avanti, MD   4 months ago Essential hypertension   [place] Vigg, Avanti, MD   5 months ago Elevated serum creatinine   [facility] Vigg, Avanti, MD   6 months ago Elevated fasting blood sugar   [place], MD      Future Appointments            In 3 months Vigg, Avanti, MD [facility], [place]   In 5 months  [employer], [place]

## 2021-08-15 ENCOUNTER — Ambulatory Visit (INDEPENDENT_AMBULATORY_CARE_PROVIDER_SITE_OTHER): Payer: Medicare Other

## 2021-08-15 ENCOUNTER — Other Ambulatory Visit: Payer: Self-pay

## 2021-08-15 DIAGNOSIS — I10 Essential (primary) hypertension: Secondary | ICD-10-CM

## 2021-08-15 DIAGNOSIS — I2510 Atherosclerotic heart disease of native coronary artery without angina pectoris: Secondary | ICD-10-CM

## 2021-08-15 DIAGNOSIS — E785 Hyperlipidemia, unspecified: Secondary | ICD-10-CM

## 2021-08-15 MED ORDER — FEXOFENADINE HCL 180 MG PO TABS: 180.0000 mg | ORAL_TABLET | Freq: Every day | ORAL | 2 refills | Status: DC

## 2021-08-15 NOTE — Chronic Care Management (AMB) (Signed)
Chronic Care Management   CCM RN Visit Note  08/15/2021 Name: Ricky Melton MRN: [id number] DOB: August 13, 1951  Subjective: Ricky Melton is a 70 y.o. year old male who is a primary care patient of Vigg, Avanti, MD. The care management team was consulted for assistance with disease management and care coordination needs.    Engaged with patient by telephone for follow up visit in response to provider referral for case management and/or care coordination services.   Consent to Services:  The patient was given information about Chronic Care Management services, agreed to services, and gave verbal consent prior to initiation of services.  Please see initial visit note for detailed documentation.   Patient agreed to services and verbal consent obtained.   Assessment: Review of patient past medical history, allergies, medications, health status, including review of consultants reports, laboratory and other test data, was performed as part of comprehensive evaluation and provision of chronic care management services.   SDOH (Social Determinants of Health) assessments and interventions performed:    CCM Care Plan  No Known Allergies  Outpatient Encounter Medications as of 08/15/2021  Medication Sig   albuterol (VENTOLIN HFA) 108 (90 Base) MCG/ACT inhaler    allopurinol (ZYLOPRIM) 300 MG tablet TAKE 1 TABLET BY MOUTH  DAILY   amLODipine (NORVASC) 10 MG tablet Take 1 tablet (10 mg total) by mouth every evening.   aspirin 81 MG tablet Take 81 mg by mouth daily.   benazepril (LOTENSIN) 20 MG tablet TAKE 1 TABLET BY MOUTH  DAILY   carvedilol (COREG) 25 MG tablet TAKE 1 TABLET BY MOUTH  TWICE DAILY WITH A MEAL   clopidogrel (PLAVIX) 75 MG tablet TAKE 1 TABLET BY MOUTH  DAILY   fexofenadine (ALLEGRA ALLERGY) 180 MG tablet Take 1 tablet (180 mg total) by mouth daily.   fluticasone (FLONASE) 50 MCG/ACT nasal spray Place 2 sprays into both nostrils daily.   levothyroxine (SYNTHROID) 50 MCG tablet  TAKE 1 TABLET BY MOUTH  DAILY   pantoprazole (PROTONIX) 40 MG tablet Take 1 tablet by mouth daily.   rosuvastatin (CRESTOR) 40 MG tablet TAKE 1 TABLET BY MOUTH  DAILY (Patient taking differently: Take 40 mg by mouth daily.)   [DISCONTINUED] fexofenadine (ALLEGRA ALLERGY) 180 MG tablet Take 1 tablet (180 mg total) by mouth daily.   No facility-administered encounter medications on file as of 08/15/2021.    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   CAD (coronary artery disease) 03/23/2015   Benign hematuria 03/23/2015   Hypertension 03/23/2015   Hyperlipidemia 03/23/2015   Hypothyroidism 03/23/2015   Gout 03/23/2015    Conditions to be addressed/monitored:CAD, HTN, and HLD  Care Plan : RNCM: Coronary Artery Disease (Adult) and HLD  Updates made by Vanita Ingles, RN since 08/15/2021 12:00 AM     Problem: RNCM: Disease Progression (Coronary Artery Disease)   Priority: Medium     Long-Range Goal: RNCM: Disease Progression Prevented or Minimized   Start Date: 10/13/2020  Expected End Date: 04/03/2022  Recent Progress: On track  Priority: Medium  Note:   Current Barriers:  Poorly controlled hyperlipidemia, complicated by ENT issue- COVID negative, HTN Current antihyperlipidemic regimen:Crestor 40 mg  Most recent lipid panel:  Component Value Date/Time   CHOL 97 (L) 05/24/2021 0823   CHOL 98 05/14/2019 1300   TRIG 86 05/24/2021 0823   TRIG 205 (H) 05/14/2019 1300   HDL 34 (L) 05/24/2021 0823   CHOLHDL 2.9 05/24/2021 0823   VLDL 41 (H) 05/14/2019 1300   LDLCALC 46 05/24/2021 0823   ASCVD risk enhancing conditions: age >73, pre-DM, HTN, CKD, former smoker Unable to independently CAD  and HLD Lacks social connections Does not contact provider office for questions/concerns RN Care Manager Clinical Goal(s):  patient will work with Consulting civil engineer, providers, and care team towards execution of optimized self-health management plan patient will verbalize understanding of plan for effective management of CAD and HLD patient will work with RNCM and pcp to address needs related to effective management of HLD and CAD patient will demonstrate a decrease in CAD and HLD  exacerbations patient will take all medications exactly as prescribed and will call provider for medication related questions patient will attend all scheduled medical appointments:as scheduled with the pcp patient will demonstrate improved health management independence patient will demonstrate understanding of rationale for each prescribed medication the patient will demonstrate ongoing self health care management ability Interventions: Collaboration with Vigg, Avanti, MD regarding development and update of comprehensive plan of care as evidenced by provider attestation and co-signature Inter-disciplinary care team collaboration (see longitudinal plan of care) Medication review performed; medication list updated in electronic medical record. 06-17-2021: The patient states that he is doing well and denies any issues with his medications at this time. 08-15-2021: Incoming call from the patient today asking for help with a refill for his fexofenadine. Secure message sent to the pcp and clinical staff asking for assistance with medications refill.  Inter-disciplinary care team collaboration (see longitudinal plan of care) Referred to pharmacy team for assistance with HLD and CAD medication management Evaluation of current treatment plan related to CAD and HLD  and patient's adherence to plan as established by provider. 06-17-2021: The patient states he is doing well. He has changed his dietary habits and cut out sodas and  sweets. He is walking 1 to 5 miles a day and has lost down to 213 pounds. He is sleeping well and feels much better. Praised the patient for positive changes in his health and well being. Will continue to monitor for changes. 08-15-2021: The patient denies any issue with his CAD and HLD health at this time.  Advised patient to call the office for changes in condition, questions, or new concerns. Reviewed medications with patient and discussed compliance. 06-17-2021: Is compliant with medications. 08-15-2021: Ask for help with his medications refill to [facility], needs fexofenadine 180 mg refilled.  Reviewed scheduled/upcoming provider appointments including: as scheduled Discussed plans with patient for ongoing care management follow up and provided patient with direct contact information for care management team Patient Goals/Self-Care Activities: - call for medicine refill 2 or 3 days before it runs out - call if I am sick and can't take my medicine - keep a list of all the medicines I take; vitamins and herbals too - learn to read medicine labels - use a pillbox to sort medicine - use an alarm clock or phone to remind me to take my medicine - change to whole grain breads, cereal, pasta - drink 6 to 8 glasses of water each day - eat 3 to 5 servings of fruits and vegetables each day - eat 5 or 6 small meals each day - eat fish at least  once per week - fill half the plate with nonstarchy vegetables - limit fast food meals to no more than 1 per week - manage portion size - prepare main meal at home 3 to 5 days each week - read food labels for fat, fiber, carbohydrates and portion size - reduce red meat to 2 to 3 times a week - switch to sugar-free drinks - be open to making changes - I can manage, know and watch for signs of a heart attack - if I have chest pain, call for help - learn about small changes that will make a big difference - learn my personal risk factors  Follow Up Plan:  Telephone follow up appointment with care management team member scheduled for: 08-26-2021 at 145 pm      Care Plan : RNCM: Hypertension (Adult)  Updates made by Vanita Ingles, RN since 08/15/2021 12:00 AM     Problem: RNCM: Hypertension (Hypertension)   Priority: Medium     Long-Range Goal: RNCM: Hypertension Monitored   Start Date: 10/13/2020  Expected End Date: 04/03/2022  This Visit's Progress: On track  Recent Progress: On track  Priority: Medium  Note:   Objective:  Last practice recorded BP readings:  BP Readings from Last 3 Encounters:  07/07/21 128/60  03/03/21 130/75  02/24/21 134/72  06-08-2021 164/82 at kidney specialist but after being there a while retook and it was 130/70 Most recent eGFR/CrCl:  Lab Results  Component Value Date   EGFR 55 (L) 05/24/2021    No components found for: CRCL Current Barriers:  Knowledge Deficits related to basic understanding of hypertension pathophysiology and self care management Knowledge Deficits related to understanding of medications prescribed for management of hypertension Unable to independently to effectively manage HTN Does not contact provider office for questions/concerns Case Manager Clinical Goal(s):  patient will verbalize understanding of plan for hypertension management patient will attend all scheduled medical appointments: as scheduled patient will demonstrate improved adherence to prescribed treatment plan for hypertension as evidenced by taking all medications as prescribed, monitoring and recording blood pressure as directed, adhering to low sodium/DASH diet patient will demonstrate improved health management independence as evidenced by checking blood pressure as directed and notifying PCP if SBP>160 or DBP > 90, taking all medications as prescribe, and adhering to a low sodium diet as discussed. patient will verbalize basic understanding of hypertension disease process and self health management plan as  evidenced by compliance with medications, compliance with heart healthy diet, and working with the CCM team to effectively manage health and well being.  Interventions:  Collaboration with Charlynne Cousins, MD regarding development and update of comprehensive plan of care as evidenced by provider attestation and co-signature Inter-disciplinary care team collaboration (see longitudinal plan of care) Evaluation of current treatment plan related to hypertension self management and patient's adherence to plan as established by provider. 06-17-2021: The patient is doing well with his blood pressures. States that they are a little elevated when he to the doctor but after being there they come down. He is feeling great and denies any new concerns Provided education to patient re: stroke prevention, s/s of heart attack and stroke, DASH diet, complications of uncontrolled blood pressure. 06-17-2021: Making positive changes in his health and well being. Has lost weight, is walking daily, is eating healthier, cut out sodas, and sweets. The patient states he is very happy with his current health and well being. Praised for accomplishments. 08-15-2021: Is doing well. No acute changes  Reviewed  medications with patient and discussed importance of compliance. 08-15-2021: Is compliant with medications.  Discussed plans with patient for ongoing care management follow up and provided patient with direct contact information for care management team Advised patient, providing education and rationale, to monitor blood pressure daily and record, calling PCP for findings outside established parameters.  Reviewed scheduled/upcoming provider appointments including: as scheduled  Self-Care Activities: - Self administers medications as prescribed Attends all scheduled provider appointments Calls provider office for new concerns, questions, or BP outside discussed parameters Checks BP and records as discussed Follows a low sodium  diet/DASH diet Patient Goals: - check blood pressure 3 times per week - choose a place to take my blood pressure (home, clinic or office, retail store) - write blood pressure results in a log or diary - agree on reward when goals are met - agree to work together to make changes - ask questions to understand - have a family meeting to talk about healthy habits - learn about high blood pressure  Follow Up Plan: Telephone follow up appointment with care management team member scheduled for: 08-26-2021 at 145  pm     Plan:Telephone follow up appointment with care management team member scheduled for:  08-26-2021 at 145 pm  [place], MSN, [facility] Mobile: [phone]

## 2021-08-15 NOTE — Patient Instructions (Signed)
Visit Information  Thank you for taking time to visit with me today. Please don't hesitate to contact me if I can be of assistance to you before our next scheduled telephone appointment.  Following are the goals we discussed today:  Conditions to be addressed/monitored:CAD, HTN, and HLD   Care Plan : RNCM: Coronary Artery Disease (Adult) and HLD  Updates made by Vanita Ingles, RN since 08/15/2021 12:00 AM       Problem: RNCM: Disease Progression (Coronary Artery Disease)    Priority: Medium       Long-Range Goal: RNCM: Disease Progression Prevented or Minimized    Start Date: 10/13/2020  Expected End Date: 04/03/2022  Recent Progress: On track  Priority: Medium  Note:   Current Barriers:  Poorly controlled hyperlipidemia, complicated by ENT issue- COVID negative, HTN Current antihyperlipidemic regimen:Crestor 40 mg  Most recent lipid panel:          Component Value Date/Time    CHOL 97 (L) 05/24/2021 0823    CHOL 98 05/14/2019 1300    TRIG 86 05/24/2021 0823    TRIG 205 (H) 05/14/2019 1300    HDL 34 (L) 05/24/2021 0823    CHOLHDL 2.9 05/24/2021 0823    VLDL 41 (H) 05/14/2019 1300    LDLCALC 46 05/24/2021 0823    ASCVD risk enhancing conditions: age >48, pre-DM, HTN, CKD, former smoker Unable to independently CAD and HLD Lacks social connections Does not contact provider office for questions/concerns RN Care Manager Clinical Goal(s):  patient will work with Consulting civil engineer, providers, and care team towards execution of optimized self-health management plan patient will verbalize understanding of plan for effective management of CAD and HLD patient will work with RNCM and pcp to address needs related to effective management of HLD and CAD patient will demonstrate a decrease in CAD and HLD  exacerbations patient will take all medications exactly as prescribed and will call provider for medication related questions patient will attend all scheduled medical appointments:as  scheduled with the pcp patient will demonstrate improved health management independence patient will demonstrate understanding of rationale for each prescribed medication the patient will demonstrate ongoing self health care management ability Interventions: Collaboration with Vigg, Avanti, MD regarding development and update of comprehensive plan of care as evidenced by provider attestation and co-signature Inter-disciplinary care team collaboration (see longitudinal plan of care) Medication review performed; medication list updated in electronic medical record. 06-17-2021: The patient states that he is doing well and denies any issues with his medications at this time. 08-15-2021: Incoming call from the patient today asking for help with a refill for his fexofenadine. Secure message sent to the pcp and clinical staff asking for assistance with medications refill.  Inter-disciplinary care team collaboration (see longitudinal plan of care) Referred to pharmacy team for assistance with HLD and CAD medication management Evaluation of current treatment plan related to CAD and HLD  and patient's adherence to plan as established by provider. 06-17-2021: The patient states he is doing well. He has changed his dietary habits and cut out sodas and sweets. He is walking 1 to 5 miles a day and has lost down to 213 pounds. He is sleeping well and feels much better. Praised the patient for positive changes in his health and well being. Will continue to monitor for changes. 08-15-2021: The patient denies any issue with his CAD and HLD health at this time.  Advised patient to call the office for changes in condition, questions, or new concerns. Reviewed  medications with patient and discussed compliance. 06-17-2021: Is compliant with medications. 08-15-2021: Ask for help with his medications refill to [facility], needs fexofenadine 180 mg refilled.  Reviewed scheduled/upcoming provider appointments including: as  scheduled Discussed plans with patient for ongoing care management follow up and provided patient with direct contact information for care management team Patient Goals/Self-Care Activities: - call for medicine refill 2 or 3 days before it runs out - call if I am sick and can't take my medicine - keep a list of all the medicines I take; vitamins and herbals too - learn to read medicine labels - use a pillbox to sort medicine - use an alarm clock or phone to remind me to take my medicine - change to whole grain breads, cereal, pasta - drink 6 to 8 glasses of water each day - eat 3 to 5 servings of fruits and vegetables each day - eat 5 or 6 small meals each day - eat fish at least once per week - fill half the plate with nonstarchy vegetables - limit fast food meals to no more than 1 per week - manage portion size - prepare main meal at home 3 to 5 days each week - read food labels for fat, fiber, carbohydrates and portion size - reduce red meat to 2 to 3 times a week - switch to sugar-free drinks - be open to making changes - I can manage, know and watch for signs of a heart attack - if I have chest pain, call for help - learn about small changes that will make a big difference - learn my personal risk factors   Follow Up Plan: Telephone follow up appointment with care management team member scheduled for: 08-26-2021 at 145 pm        Care Plan : RNCM: Hypertension (Adult)  Updates made by Vanita Ingles, RN since 08/15/2021 12:00 AM       Problem: RNCM: Hypertension (Hypertension)    Priority: Medium       Long-Range Goal: RNCM: Hypertension Monitored    Start Date: 10/13/2020  Expected End Date: 04/03/2022  This Visit's Progress: On track  Recent Progress: On track  Priority: Medium  Note:   Objective:  Last practice recorded BP readings:     BP Readings from Last 3 Encounters:  07/07/21 128/60  03/03/21 130/75  02/24/21 134/72  06-08-2021 164/82 at kidney specialist  but after being there a while retook and it was 130/70 Most recent eGFR/CrCl:       Lab Results  Component Value Date    EGFR 55 (L) 05/24/2021    No components found for: CRCL Current Barriers:  Knowledge Deficits related to basic understanding of hypertension pathophysiology and self care management Knowledge Deficits related to understanding of medications prescribed for management of hypertension Unable to independently to effectively manage HTN Does not contact provider office for questions/concerns Case Manager Clinical Goal(s):  patient will verbalize understanding of plan for hypertension management patient will attend all scheduled medical appointments: as scheduled patient will demonstrate improved adherence to prescribed treatment plan for hypertension as evidenced by taking all medications as prescribed, monitoring and recording blood pressure as directed, adhering to low sodium/DASH diet patient will demonstrate improved health management independence as evidenced by checking blood pressure as directed and notifying PCP if SBP>160 or DBP > 90, taking all medications as prescribe, and adhering to a low sodium diet as discussed. patient will verbalize basic understanding of hypertension disease process and self health  management plan as evidenced by compliance with medications, compliance with heart healthy diet, and working with the CCM team to effectively manage health and well being.  Interventions:  Collaboration with Charlynne Cousins, MD regarding development and update of comprehensive plan of care as evidenced by provider attestation and co-signature Inter-disciplinary care team collaboration (see longitudinal plan of care) Evaluation of current treatment plan related to hypertension self management and patient's adherence to plan as established by provider. 06-17-2021: The patient is doing well with his blood pressures. States that they are a little elevated when he to the doctor  but after being there they come down. He is feeling great and denies any new concerns Provided education to patient re: stroke prevention, s/s of heart attack and stroke, DASH diet, complications of uncontrolled blood pressure. 06-17-2021: Making positive changes in his health and well being. Has lost weight, is walking daily, is eating healthier, cut out sodas, and sweets. The patient states he is very happy with his current health and well being. Praised for accomplishments. 08-15-2021: Is doing well. No acute changes  Reviewed medications with patient and discussed importance of compliance. 08-15-2021: Is compliant with medications.  Discussed plans with patient for ongoing care management follow up and provided patient with direct contact information for care management team Advised patient, providing education and rationale, to monitor blood pressure daily and record, calling PCP for findings outside established parameters.  Reviewed scheduled/upcoming provider appointments including: as scheduled  Self-Care Activities: - Self administers medications as prescribed Attends all scheduled provider appointments Calls provider office for new concerns, questions, or BP outside discussed parameters Checks BP and records as discussed Follows a low sodium diet/DASH diet Patient Goals: - check blood pressure 3 times per week - choose a place to take my blood pressure (home, clinic or office, retail store) - write blood pressure results in a log or diary - agree on reward when goals are met - agree to work together to make changes - ask questions to understand - have a family meeting to talk about healthy habits - learn about high blood pressure    Our next appointment is by telephone on 08-26-2021 at 145 pm  Please call the care guide team at [phone] if you need to cancel or reschedule your appointment.   If you are experiencing a Mental Health or [place] or need someone to  talk to, please call the Suicide and Crisis Lifeline: 988 call the [place] National Suicide Prevention Lifeline: 929 517 5823 or TTY: 613-498-9490 TTY 4071230921) to talk to a trained counselor call 1-800-273-TALK (toll free, 24 hour hotline)   The patient verbalized understanding of instructions, educational materials, and care plan provided today and declined offer to receive copy of patient instructions, educational materials, and care plan.   Noreene Larsson RN, MSN, [facility] Mobile: [phone]

## 2021-08-26 ENCOUNTER — Telehealth: Payer: Self-pay

## 2021-08-26 ENCOUNTER — Telehealth: Payer: Medicare Other

## 2021-08-26 ENCOUNTER — Ambulatory Visit: Payer: Self-pay

## 2021-08-26 DIAGNOSIS — E785 Hyperlipidemia, unspecified: Secondary | ICD-10-CM

## 2021-08-26 DIAGNOSIS — I2583 Coronary atherosclerosis due to lipid rich plaque: Secondary | ICD-10-CM

## 2021-08-26 DIAGNOSIS — I2510 Atherosclerotic heart disease of native coronary artery without angina pectoris: Secondary | ICD-10-CM

## 2021-08-26 DIAGNOSIS — I10 Essential (primary) hypertension: Secondary | ICD-10-CM

## 2021-08-26 NOTE — Telephone Encounter (Signed)
°  Care Management   Follow Up Note   08/26/2021 Name: Ricky Melton MRN: [id number] DOB: 04/20/1951   Referred by: Charlynne Cousins, MD Reason for referral : Chronic Care Management (RNCM: Follow up for Chronic Disease Management and Care Coordination Needs )   Incoming call from the patient. See new encounter.   Follow Up Plan: Telephone follow up appointment with care management team member scheduled for: 10-28-2021 at 230 pm  Noreene Larsson RN, MSN, [facility] Mobile: [phone]

## 2021-08-26 NOTE — Patient Instructions (Signed)
Visit Information  Thank you for taking time to visit with me today. Please don't hesitate to contact me if I can be of assistance to you before our next scheduled telephone appointment.  Following are the goals we discussed today:  RNCM Clinical Goal(s):  Patient will verbalize basic understanding of CAD, HTN, and HLD disease process and self health management plan as evidenced by keeping appointments, following the plan of care and working with the pcp and CCM team to optimize plan of care for health and well being  demonstrate understanding of rationale for each prescribed medication as evidenced by compliance and calling for refills before running out of medications     attend all scheduled medical appointments: 11-10-2021 at 0820 am as evidenced by keeping appointments and calling the office for needed scheduled changes         demonstrate improved and ongoing health management independence as evidenced by stable conditions, stable VS and not acute exacerbations of conditions         demonstrate a decrease in CAD, HTN, and HLD exacerbations  as evidenced by managed chronic conditions and effective management of chronic conditions  demonstrate ongoing self health care management ability effectively manage chronic conditions  as evidenced by   working with the CCM team through collaboration with Consulting civil engineer, provider, and care team.    Interventions: 1:1 collaboration with primary care provider regarding development and update of comprehensive plan of care as evidenced by provider attestation and co-signature Inter-disciplinary care team collaboration (see longitudinal plan of care) Evaluation of current treatment plan related to  self management and patient's adherence to plan as established by provider     CAD  (Status: Goal on Track (progressing): YES.) Long Term Goal  Assessed understanding of CAD diagnosis Medications reviewed including medications utilized in CAD treatment  plan Provided education on importance of blood pressure control in management of CAD; Provided education on Importance of limiting foods high in cholesterol; Counseled on importance of regular laboratory monitoring as prescribed; Counseled on the importance of exercise goals with target of 150 minutes per week Reviewed Importance of taking all medications as prescribed Reviewed Importance of attending all scheduled provider appointments Advised to report any changes in symptoms or exercise tolerance Screening for signs and symptoms of depression related to chronic disease state;  Assessed social determinant of health barriers;    Hyperlipidemia:  (Status: Goal on Track (progressing): YES.) Long Term Goal       Lab Results  Component Value Date    CHOL 97 (L) 05/24/2021    HDL 34 (L) 05/24/2021    LDLCALC 46 05/24/2021    TRIG 86 05/24/2021    CHOLHDL 2.9 05/24/2021      Medication review performed; medication list updated in electronic medical record.  Provider established cholesterol goals reviewed; Counseled on importance of regular laboratory monitoring as prescribed; Provided HLD educational materials; Reviewed role and benefits of statin for ASCVD risk reduction; Discussed strategies to manage statin-induced myalgias; Reviewed importance of limiting foods high in cholesterol; Reviewed exercise goals and target of 150 minutes per week;   Hypertension: (Status: Goal on Track (progressing): YES.) Last practice recorded BP readings:     BP Readings from Last 3 Encounters:  07/07/21 128/60  03/03/21 130/75  02/24/21 134/72  Most recent eGFR/CrCl:       Lab Results  Component Value Date    EGFR 55 (L) 05/24/2021    No components found for: CRCL   Evaluation of current  treatment plan related to hypertension self management and patient's adherence to plan as established by provider;   Provided education to patient re: stroke prevention, s/s of heart attack and  stroke; Reviewed prescribed diet heart healthy diet  Reviewed medications with patient and discussed importance of compliance;  Discussed plans with patient for ongoing care management follow up and provided patient with direct contact information for care management team; Advised patient, providing education and rationale, to monitor blood pressure daily and record, calling PCP for findings outside established parameters;  Advised patient to discuss blood pressure trends with provider; Provided education on prescribed diet heart healthy ;  Discussed complications of poorly controlled blood pressure such as heart disease, stroke, circulatory complications, vision complications, kidney impairment, sexual dysfunction;    Patient Goals/Self-Care Activities: Take medications as prescribed   Attend all scheduled provider appointments Call pharmacy for medication refills 3-7 days in advance of running out of medications Attend church or other social activities Perform all self care activities independently  Perform IADL's (shopping, preparing meals, housekeeping, managing finances) independently Call provider office for new concerns or questions  Work with the social worker to address care coordination needs and will continue to work with the clinical team to address health care and disease management related needs call the Suicide and Crisis Lifeline: 988 call the [place] National Suicide Prevention Lifeline: 3060150438 or TTY: 709-194-0585 TTY 215-218-0668) to talk to a trained counselor call 1-800-273-TALK (toll free, 24 hour hotline) if experiencing a Mental Health or [place]  check blood pressure weekly choose a place to take my blood pressure (home, clinic or office, retail store) write blood pressure results in a log or diary learn about high blood pressure keep a blood pressure log take blood pressure log to all doctor appointments call doctor for signs and symptoms of  high blood pressure develop an action plan for high blood pressure keep all doctor appointments take medications for blood pressure exactly as prescribed report new symptoms to your doctor eat more whole grains, fruits and vegetables, lean meats and healthy fats - call for medicine refill 2 or 3 days before it runs out - take all medications exactly as prescribed - call doctor with any symptoms you believe are related to your medicine - call doctor when you experience any new symptoms - go to all doctor appointments as scheduled - adhere to prescribed diet: heart healthy      Our next appointment is by telephone on 10-28-2021  at 230 pm  Please call the care guide team at [phone] if you need to cancel or reschedule your appointment.   If you are experiencing a Mental Health or [place] or need someone to talk to, please call the Suicide and Crisis Lifeline: 988 call the [place] National Suicide Prevention Lifeline: 872-257-4717 or TTY: 9344103353 TTY 941-248-1365) to talk to a trained counselor call 1-800-273-TALK (toll free, 24 hour hotline)   The patient verbalized understanding of instructions, educational materials, and care plan provided today and declined offer to receive copy of patient instructions, educational materials, and care plan.   Noreene Larsson RN, MSN, [facility] Mobile: [phone]

## 2021-08-26 NOTE — Telephone Encounter (Signed)
Pt was returning Pamela's call, please advise.

## 2021-08-26 NOTE — Chronic Care Management (AMB) (Signed)
Chronic Care Management   CCM RN Visit Note  08/26/2021 Name: Ricky Melton MRN: [id number] DOB: 07/09/1951  Subjective: Ricky Melton is a 70 y.o. year old male who is a primary care patient of Vigg, Avanti, MD. The care management team was consulted for assistance with disease management and care coordination needs.    Engaged with patient by telephone for follow up visit in response to provider referral for case management and/or care coordination services.   Consent to Services:  The patient was given information about Chronic Care Management services, agreed to services, and gave verbal consent prior to initiation of services.  Please see initial visit note for detailed documentation.   Patient agreed to services and verbal consent obtained.   Assessment: Review of patient past medical history, allergies, medications, health status, including review of consultants reports, laboratory and other test data, was performed as part of comprehensive evaluation and provision of chronic care management services.   SDOH (Social Determinants of Health) assessments and interventions performed:    CCM Care Plan  No Known Allergies  Outpatient Encounter Medications as of 08/26/2021  Medication Sig   albuterol (VENTOLIN HFA) 108 (90 Base) MCG/ACT inhaler    allopurinol (ZYLOPRIM) 300 MG tablet TAKE 1 TABLET BY MOUTH  DAILY   amLODipine (NORVASC) 10 MG tablet Take 1 tablet (10 mg total) by mouth every evening.   aspirin 81 MG tablet Take 81 mg by mouth daily.   benazepril (LOTENSIN) 20 MG tablet TAKE 1 TABLET BY MOUTH  DAILY   carvedilol (COREG) 25 MG tablet TAKE 1 TABLET BY MOUTH  TWICE DAILY WITH A MEAL   clopidogrel (PLAVIX) 75 MG tablet TAKE 1 TABLET BY MOUTH  DAILY   fexofenadine (ALLEGRA ALLERGY) 180 MG tablet Take 1 tablet (180 mg total) by mouth daily.   fluticasone (FLONASE) 50 MCG/ACT nasal spray Place 2 sprays into both nostrils daily.   levothyroxine (SYNTHROID) 50 MCG tablet  TAKE 1 TABLET BY MOUTH  DAILY   pantoprazole (PROTONIX) 40 MG tablet Take 1 tablet by mouth daily.   rosuvastatin (CRESTOR) 40 MG tablet TAKE 1 TABLET BY MOUTH  DAILY (Patient taking differently: Take 40 mg by mouth daily.)   No facility-administered encounter medications on file as of 08/26/2021.    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   CAD (coronary artery disease) 03/23/2015   Benign hematuria 03/23/2015   Hypertension 03/23/2015   Hyperlipidemia 03/23/2015   Hypothyroidism 03/23/2015   Gout 03/23/2015    Conditions to be addressed/monitored:CAD, HTN, and HLD  Care Plan : RNCM: Coronary Artery Disease (Adult) and HLD  Updates made by Vanita Ingles, RN since 08/26/2021 12:00 AM  Completed 08/26/2021   Problem: RNCM: Disease Progression (Coronary Artery Disease) Resolved 08/26/2021  Priority: Medium     Long-Range Goal: RNCM: Disease Progression Prevented or Minimized Completed 08/26/2021  Start Date: 10/13/2020  Expected End Date: 04/03/2022  Recent Progress: On track  Priority: Medium  Note:   Current Barriers: Resolving, duplicate goal  Poorly controlled hyperlipidemia, complicated by ENT issue- COVID negative, HTN Current antihyperlipidemic regimen:Crestor 40 mg  Most recent lipid panel:     Component Value Date/Time   CHOL 97 (L) 05/24/2021 0102  CHOL 98 05/14/2019 1300   TRIG 86 05/24/2021 0823   TRIG 205 (H) 05/14/2019 1300   HDL 34 (L) 05/24/2021 0823   CHOLHDL 2.9 05/24/2021 0823   VLDL 41 (H) 05/14/2019 1300   LDLCALC 46 05/24/2021 0823   ASCVD risk enhancing conditions: age >76, pre-DM, HTN, CKD, former smoker Unable to independently CAD and HLD Lacks social  connections Does not contact provider office for questions/concerns RN Care Manager Clinical Goal(s):  patient will work with Consulting civil engineer, providers, and care team towards execution of optimized self-health management plan patient will verbalize understanding of plan for effective management of CAD and HLD patient will work with RNCM and pcp to address needs related to effective management of HLD and CAD patient will demonstrate a decrease in CAD and HLD  exacerbations patient will take all medications exactly as prescribed and will call provider for medication related questions patient will attend all scheduled medical appointments:as scheduled with the pcp patient will demonstrate improved health management independence patient will demonstrate understanding of rationale for each prescribed medication the patient will demonstrate ongoing self health care management ability Interventions: Collaboration with Vigg, Avanti, MD regarding development and update of comprehensive plan of care as evidenced by provider attestation and co-signature Inter-disciplinary care team collaboration (see longitudinal plan of care) Medication review performed; medication list updated in electronic medical record. 06-17-2021: The patient states that he is doing well and denies any issues with his medications at this time. 08-15-2021: Incoming call from the patient today asking for help with a refill for his fexofenadine. Secure message sent to the pcp and clinical staff asking for assistance with medications refill.  Inter-disciplinary care team collaboration (see longitudinal plan of care) Referred to pharmacy team for assistance with HLD and CAD medication management Evaluation of current treatment plan related to CAD and HLD  and patient's adherence to plan as established by provider. 06-17-2021: The patient states he is doing well. He has changed his dietary habits and cut out sodas and sweets. He is walking 1 to  5 miles a day and has lost down to 213 pounds. He is sleeping well and feels much better. Praised the patient for positive changes in his health and well being. Will continue to monitor for changes. 08-15-2021: The patient denies any issue with his CAD and HLD health at this time.  Advised patient to call the office for changes in condition, questions, or new concerns. Reviewed medications with patient and discussed compliance. 06-17-2021: Is compliant with medications. 08-15-2021: Ask for help with his medications refill to [facility], needs fexofenadine 180 mg refilled.  Reviewed scheduled/upcoming provider appointments including: as scheduled Discussed plans with patient for ongoing care management follow up and provided patient with direct contact information for care management team Patient Goals/Self-Care Activities: - call for medicine refill 2 or 3 days before it runs out - call if I am sick and can't take my medicine - keep a list of all the medicines I take; vitamins and herbals too - learn to read medicine labels - use a pillbox to sort medicine - use an alarm clock or phone to remind me to take my medicine - change to whole grain breads, cereal, pasta - drink 6 to 8 glasses of water each day - eat 3 to 5 servings of fruits and vegetables each day - eat 5 or 6 small meals each day - eat fish at least once per week - fill half the plate with nonstarchy vegetables -  limit fast food meals to no more than 1 per week - manage portion size - prepare main meal at home 3 to 5 days each week - read food labels for fat, fiber, carbohydrates and portion size - reduce red meat to 2 to 3 times a week - switch to sugar-free drinks - be open to making changes - I can manage, know and watch for signs of a heart attack - if I have chest pain, call for help - learn about small changes that will make a big difference - learn my personal risk factors  Follow Up Plan: Telephone follow up appointment  with care management team member scheduled for: 08-26-2021 at 145 pm      Care Plan : RNCM: Hypertension (Adult)  Updates made by Vanita Ingles, RN since 08/26/2021 12:00 AM  Completed 08/26/2021   Problem: RNCM: Hypertension (Hypertension) Resolved 08/26/2021  Priority: Medium     Long-Range Goal: RNCM: Hypertension Monitored Completed 08/26/2021  Start Date: 10/13/2020  Expected End Date: 04/03/2022  Recent Progress: On track  Priority: Medium  Note:   Objective: Resolving, duplicate goal  Last practice recorded BP readings:  BP Readings from Last 3 Encounters:  07/07/21 128/60  03/03/21 130/75  02/24/21 134/72  06-08-2021 164/82 at kidney specialist but after being there a while retook and it was 130/70 Most recent eGFR/CrCl:  Lab Results  Component Value Date   EGFR 55 (L) 05/24/2021    No components found for: CRCL Current Barriers:  Knowledge Deficits related to basic understanding of hypertension pathophysiology and self care management Knowledge Deficits related to understanding of medications prescribed for management of hypertension Unable to independently to effectively manage HTN Does not contact provider office for questions/concerns Case Manager Clinical Goal(s):  patient will verbalize understanding of plan for hypertension management patient will attend all scheduled medical appointments: as scheduled patient will demonstrate improved adherence to prescribed treatment plan for hypertension as evidenced by taking all medications as prescribed, monitoring and recording blood pressure as directed, adhering to low sodium/DASH diet patient will demonstrate improved health management independence as evidenced by checking blood pressure as directed and notifying PCP if SBP>160 or DBP > 90, taking all medications as prescribe, and adhering to a low sodium diet as discussed. patient will verbalize basic understanding of hypertension disease process and self health  management plan as evidenced by compliance with medications, compliance with heart healthy diet, and working with the CCM team to effectively manage health and well being.  Interventions:  Collaboration with Charlynne Cousins, MD regarding development and update of comprehensive plan of care as evidenced by provider attestation and co-signature Inter-disciplinary care team collaboration (see longitudinal plan of care) Evaluation of current treatment plan related to hypertension self management and patient's adherence to plan as established by provider. 06-17-2021: The patient is doing well with his blood pressures. States that they are a little elevated when he to the doctor but after being there they come down. He is feeling great and denies any new concerns Provided education to patient re: stroke prevention, s/s of heart attack and stroke, DASH diet, complications of uncontrolled blood pressure. 06-17-2021: Making positive changes in his health and well being. Has lost weight, is walking daily, is eating healthier, cut out sodas, and sweets. The patient states he is very happy with his current health and well being. Praised for accomplishments. 08-15-2021: Is doing well. No acute changes  Reviewed medications with patient and discussed importance of compliance. 08-15-2021: Is compliant with  medications.  Discussed plans with patient for ongoing care management follow up and provided patient with direct contact information for care management team Advised patient, providing education and rationale, to monitor blood pressure daily and record, calling PCP for findings outside established parameters.  Reviewed scheduled/upcoming provider appointments including: as scheduled  Self-Care Activities: - Self administers medications as prescribed Attends all scheduled provider appointments Calls provider office for new concerns, questions, or BP outside discussed parameters Checks BP and records as discussed Follows  a low sodium diet/DASH diet Patient Goals: - check blood pressure 3 times per week - choose a place to take my blood pressure (home, clinic or office, retail store) - write blood pressure results in a log or diary - agree on reward when goals are met - agree to work together to make changes - ask questions to understand - have a family meeting to talk about healthy habits - learn about high blood pressure  Follow Up Plan: Telephone follow up appointment with care management team member scheduled for: 08-26-2021 at 145  pm    Care Plan : RNCM: General Plan of Care (Adult) for Chronic Disease Management and Care Coordination Needs  Updates made by Vanita Ingles, RN since 08/26/2021 12:00 AM     Problem: RNCM: Development of Plan of Care for Chronic Disease Management (CAD, HLD, HTN)   Priority: High     Long-Range Goal: RNCM: Development of Plan of Care for Chronic Disease Management (CAD, HLD, HTN)   Start Date: 08/26/2021  Expected End Date: 08/26/2022  Note:   Current Barriers:  Knowledge Deficits related to plan of care for management of CAD, HTN, and HLD  Chronic Disease Management support and education needs related to CAD, HTN, and HLD  RNCM Clinical Goal(s):  Patient will verbalize basic understanding of CAD, HTN, and HLD disease process and self health management plan as evidenced by keeping appointments, following the plan of care and working with the pcp and CCM team to optimize plan of care for health and well being  demonstrate understanding of rationale for each prescribed medication as evidenced by compliance and calling for refills before running out of medications     attend all scheduled medical appointments: 11-10-2021 at 0820 am as evidenced by keeping appointments and calling the office for needed scheduled changes         demonstrate improved and ongoing health management independence as evidenced by stable conditions, stable VS and not acute exacerbations of  conditions         demonstrate a decrease in CAD, HTN, and HLD exacerbations  as evidenced by managed chronic conditions and effective management of chronic conditions  demonstrate ongoing self health care management ability effectively manage chronic conditions  as evidenced by   working with the CCM team through collaboration with Consulting civil engineer, provider, and care team.   Interventions: 1:1 collaboration with primary care provider regarding development and update of comprehensive plan of care as evidenced by provider attestation and co-signature Inter-disciplinary care team collaboration (see longitudinal plan of care) Evaluation of current treatment plan related to  self management and patient's adherence to plan as established by provider   CAD  (Status: Goal on Track (progressing): YES.) Long Term Goal  Assessed understanding of CAD diagnosis Medications reviewed including medications utilized in CAD treatment plan Provided education on importance of blood pressure control in management of CAD; Provided education on Importance of limiting foods high in cholesterol; Counseled on importance of regular laboratory monitoring  as prescribed; Counseled on the importance of exercise goals with target of 150 minutes per week Reviewed Importance of taking all medications as prescribed Reviewed Importance of attending all scheduled provider appointments Advised to report any changes in symptoms or exercise tolerance Screening for signs and symptoms of depression related to chronic disease state;  Assessed social determinant of health barriers;   Hyperlipidemia:  (Status: Goal on Track (progressing): YES.) Long Term Goal  Lab Results  Component Value Date   CHOL 97 (L) 05/24/2021   HDL 34 (L) 05/24/2021   LDLCALC 46 05/24/2021   TRIG 86 05/24/2021   CHOLHDL 2.9 05/24/2021     Medication review performed; medication list updated in electronic medical record.  Provider established  cholesterol goals reviewed; Counseled on importance of regular laboratory monitoring as prescribed; Provided HLD educational materials; Reviewed role and benefits of statin for ASCVD risk reduction; Discussed strategies to manage statin-induced myalgias; Reviewed importance of limiting foods high in cholesterol; Reviewed exercise goals and target of 150 minutes per week;  Hypertension: (Status: Goal on Track (progressing): YES.) Last practice recorded BP readings:  BP Readings from Last 3 Encounters:  07/07/21 128/60  03/03/21 130/75  02/24/21 134/72  Most recent eGFR/CrCl:  Lab Results  Component Value Date   EGFR 55 (L) 05/24/2021    No components found for: CRCL  Evaluation of current treatment plan related to hypertension self management and patient's adherence to plan as established by provider;   Provided education to patient re: stroke prevention, s/s of heart attack and stroke; Reviewed prescribed diet heart healthy diet  Reviewed medications with patient and discussed importance of compliance;  Discussed plans with patient for ongoing care management follow up and provided patient with direct contact information for care management team; Advised patient, providing education and rationale, to monitor blood pressure daily and record, calling PCP for findings outside established parameters;  Advised patient to discuss blood pressure trends with provider; Provided education on prescribed diet heart healthy ;  Discussed complications of poorly controlled blood pressure such as heart disease, stroke, circulatory complications, vision complications, kidney impairment, sexual dysfunction;   Patient Goals/Self-Care Activities: Take medications as prescribed   Attend all scheduled provider appointments Call pharmacy for medication refills 3-7 days in advance of running out of medications Attend church or other social activities Perform all self care activities independently   Perform IADL's (shopping, preparing meals, housekeeping, managing finances) independently Call provider office for new concerns or questions  Work with the social worker to address care coordination needs and will continue to work with the clinical team to address health care and disease management related needs call the Suicide and Crisis Lifeline: 988 call the [place] National Suicide Prevention Lifeline: 3163006712 or TTY: 657-339-3112 TTY 204-225-1673) to talk to a trained counselor call 1-800-273-TALK (toll free, 24 hour hotline) if experiencing a Mental Health or [place]  check blood pressure weekly choose a place to take my blood pressure (home, clinic or office, retail store) write blood pressure results in a log or diary learn about high blood pressure keep a blood pressure log take blood pressure log to all doctor appointments call doctor for signs and symptoms of high blood pressure develop an action plan for high blood pressure keep all doctor appointments take medications for blood pressure exactly as prescribed report new symptoms to your doctor eat more whole grains, fruits and vegetables, lean meats and healthy fats - call for medicine refill 2 or 3 days before it runs  out - take all medications exactly as prescribed - call doctor with any symptoms you believe are related to your medicine - call doctor when you experience any new symptoms - go to all doctor appointments as scheduled - adhere to prescribed diet: heart healthy       Plan:Telephone follow up appointment with care management team member scheduled for:  10-28-2021 at 73 pm  Starr, MSN, [facility] Mobile: [phone]

## 2021-09-10 DIAGNOSIS — I2510 Atherosclerotic heart disease of native coronary artery without angina pectoris: Secondary | ICD-10-CM

## 2021-09-10 DIAGNOSIS — E785 Hyperlipidemia, unspecified: Secondary | ICD-10-CM

## 2021-09-10 DIAGNOSIS — I2583 Coronary atherosclerosis due to lipid rich plaque: Secondary | ICD-10-CM

## 2021-09-10 DIAGNOSIS — I10 Essential (primary) hypertension: Secondary | ICD-10-CM

## 2021-09-16 ENCOUNTER — Other Ambulatory Visit: Payer: Self-pay

## 2021-09-16 ENCOUNTER — Ambulatory Visit (INDEPENDENT_AMBULATORY_CARE_PROVIDER_SITE_OTHER): Payer: Medicare Other | Admitting: Internal Medicine

## 2021-09-16 ENCOUNTER — Encounter: Payer: Self-pay | Admitting: Internal Medicine

## 2021-09-16 VITALS — BP 150/80 | HR 69 | Temp 98.0°F | Ht 67.01 in | Wt 226.6 lb

## 2021-09-16 DIAGNOSIS — R0989 Other specified symptoms and signs involving the circulatory and respiratory systems: Secondary | ICD-10-CM | POA: Insufficient documentation

## 2021-09-16 DIAGNOSIS — R051 Acute cough: Secondary | ICD-10-CM | POA: Insufficient documentation

## 2021-09-16 DIAGNOSIS — I10 Essential (primary) hypertension: Secondary | ICD-10-CM | POA: Insufficient documentation

## 2021-09-16 LAB — VERITOR FLU A/B WAIVED
Influenza A: NEGATIVE
Influenza B: NEGATIVE

## 2021-09-16 MED ORDER — ALENDRONATE SODIUM 70 MG PO TABS: 70.0000 mg | ORAL_TABLET | ORAL | 11 refills | Status: DC

## 2021-09-16 MED ORDER — AMOXICILLIN-POT CLAVULANATE 875-125 MG PO TABS: 1.0000 | ORAL_TABLET | Freq: Two times a day (BID) | ORAL | 0 refills | Status: DC

## 2021-09-16 MED ORDER — FEXOFENADINE HCL 180 MG PO TABS: 180.0000 mg | ORAL_TABLET | Freq: Every day | ORAL | 0 refills | Status: DC

## 2021-09-16 MED ORDER — BENAZEPRIL HCL 40 MG PO TABS: 40.0000 mg | ORAL_TABLET | Freq: Every day | ORAL | 4 refills | Status: DC

## 2021-09-16 NOTE — Progress Notes (Signed)
BP (!) 150/80    Pulse 69    Temp 98 F (36.7 C) (Oral)    Ht 5' 7.01" (1.702 m)    Wt 226 lb 9.6 oz (102.8 kg)    SpO2 98%    BMI 35.48 kg/m    Subjective:    Patient ID: Ricky Melton, male    DOB: [birth year], 71 y.o.   MRN: [id number]  Chief Complaint  Patient presents with   Cough    Started about 2 days ago, has been taking OTC cold and flu alka seltzer med, cough med for HBP   Sinus Problem   chest congestion    HPI: Ricky Melton is a 71 y.o. male  Cough This is a new (x 2 days ago, coughing up yellow phelgm has  aheadache as well.) problem. The current episode started in the past 7 days. The problem has been gradually worsening. Associated symptoms include headaches and nasal congestion. Pertinent negatives include no chest pain, chills, ear congestion, ear pain, heartburn, hemoptysis, myalgias, rhinorrhea, sore throat, shortness of breath, sweats or weight loss.  Sinus Problem This is a new problem. There has been no fever. Associated symptoms include congestion, coughing, headaches and sinus pressure. Pertinent negatives include no chills, diaphoresis, ear pain, hoarse voice, neck pain, shortness of breath, sneezing, sore throat or swollen glands.   Chief Complaint  Patient presents with   Cough    Started about 2 days ago, has been taking OTC cold and flu alka seltzer med, cough med for HBP   Sinus Problem   chest congestion    Relevant past medical, surgical, family and social history reviewed and updated as indicated. Interim medical history since our last visit reviewed. Allergies and medications reviewed and updated.  Review of Systems  Constitutional:  Negative for chills, diaphoresis and weight loss.  HENT:  Positive for congestion and sinus pressure. Negative for ear pain, hoarse voice, rhinorrhea, sneezing and sore throat.   Respiratory:  Positive for cough. Negative for hemoptysis and shortness of breath.   Cardiovascular:  Negative for chest pain.   Gastrointestinal:  Negative for heartburn.  Musculoskeletal:  Negative for myalgias and neck pain.  Neurological:  Positive for headaches.   Per HPI unless specifically indicated above     Objective:    BP (!) 150/80    Pulse 69    Temp 98 F (36.7 C) (Oral)    Ht 5' 7.01" (1.702 m)    Wt 226 lb 9.6 oz (102.8 kg)    SpO2 98%    BMI 35.48 kg/m   Wt Readings from Last 3 Encounters:  09/16/21 226 lb 9.6 oz (102.8 kg)  07/07/21 220 lb 4 oz (99.9 kg)  03/03/21 234 lb 3.2 oz (106.2 kg)    Physical Exam  Results for orders placed or performed in visit on 09/16/21  Novel Coronavirus, NAA ([facility])   Specimen: Nasopharyngeal(NP) swabs in vial transport medium  Result Value Ref Range   SARS-CoV-2, NAA Not Detected Not Detected  SARS-COV-2, NAA 2 DAY TAT  Result Value Ref Range   SARS-CoV-2, NAA 2 DAY TAT Performed   Veritor Flu A/B Waived  Result Value Ref Range   Influenza A Negative Negative   Influenza B Negative Negative        Current Outpatient Medications:    albuterol (VENTOLIN HFA) 108 (90 Base) MCG/ACT inhaler, , Disp: , Rfl:    allopurinol (ZYLOPRIM) 300 MG tablet, TAKE 1 TABLET BY MOUTH  DAILY,  Disp: 90 tablet, Rfl: 3   amLODipine (NORVASC) 10 MG tablet, Take 1 tablet (10 mg total) by mouth every evening., Disp: 90 tablet, Rfl: 3   amoxicillin-clavulanate (AUGMENTIN) 875-125 MG tablet, Take 1 tablet by mouth 2 (two) times daily., Disp: 20 tablet, Rfl: 0   aspirin 81 MG tablet, Take 81 mg by mouth daily., Disp: , Rfl:    fluticasone (FLONASE) 50 MCG/ACT nasal spray, Place 2 sprays into both nostrils daily., Disp: 16 g, Rfl: 6   levothyroxine (SYNTHROID) 50 MCG tablet, TAKE 1 TABLET BY MOUTH  DAILY, Disp: 90 tablet, Rfl: 3   pantoprazole (PROTONIX) 40 MG tablet, Take 1 tablet by mouth daily., Disp: , Rfl:    benazepril (LOTENSIN) 40 MG tablet, Take 1 tablet (40 mg total) by mouth daily., Disp: 30 tablet, Rfl: 4   carvedilol (COREG) 25 MG tablet, TAKE 1 TABLET BY MOUTH   TWICE DAILY WITH A MEAL, Disp: 180 tablet, Rfl: 0   clopidogrel (PLAVIX) 75 MG tablet, TAKE 1 TABLET BY MOUTH  DAILY, Disp: 90 tablet, Rfl: 2   fexofenadine (ALLEGRA ALLERGY) 180 MG tablet, Take 1 tablet (180 mg total) by mouth daily., Disp: 10 tablet, Rfl: 0   rosuvastatin (CRESTOR) 40 MG tablet, TAKE 1 TABLET BY MOUTH  DAILY, Disp: 90 tablet, Rfl: 2    Assessment & Plan:  URI: Flu , COVID and strep ordered at this visit,all negative Will start pt on augmenitn for acute URI/. pt advised to take Tylenol q 4- 6 hourly as needed. pt to take allegra q pm as needed and to call office if symptoms worsened pt verbalised understanding of such.   This visit was completed via video visit through MyChart due to the restrictions of the COVID-19 pandemic. All issues as above were discussed and addressed. Physical exam was done as above through visual confirmation on video through MyChart. If it was felt that the patient should be evaluated in the office, they were directed there. The patient verbally consented to this visit. Location of the patient: home Location of the provider: work Those involved with this call:  Provider: Charlynne Cousins, MD CMA: Frazier Butt, [place] Desk/Registration: [employer]  Time spent on call:  10 minutes with patient face to face via video conference. More than 50% of this time was spent in counseling and coordination of care. 10 minutes total spent in review of patient's record and preparation of their chart.   Problem List Items Addressed This Visit       Cardiovascular and Mediastinum   Essential hypertension   Relevant Medications   benazepril (LOTENSIN) 40 MG tablet     Respiratory   Chest congestion   Relevant Orders   Novel Coronavirus, NAA ([facility]) (Completed)   Veritor Flu A/B Waived (Completed)     Other   Acute cough - Primary   Relevant Orders   Novel Coronavirus, NAA ([facility]) (Completed)   Veritor Flu A/B Waived (Completed)     Orders  Placed This Encounter  Procedures   Novel Coronavirus, NAA ([facility])   SARS-COV-2, NAA 2 DAY TAT   Veritor Flu A/B Waived     Meds ordered this encounter  Medications   amoxicillin-clavulanate (AUGMENTIN) 875-125 MG tablet    Sig: Take 1 tablet by mouth 2 (two) times daily.    Dispense:  20 tablet    Refill:  0   benazepril (LOTENSIN) 40 MG tablet    Sig: Take 1 tablet (40 mg total) by mouth daily.  Dispense:  30 tablet    Refill:  4    Requesting 1 year supply   fexofenadine (ALLEGRA ALLERGY) 180 MG tablet    Sig: Take 1 tablet (180 mg total) by mouth daily.    Dispense:  10 tablet    Refill:  0   DISCONTD: alendronate (FOSAMAX) 70 MG tablet    Sig: Take 1 tablet (70 mg total) by mouth every 7 (seven) days. Take with a full glass of water on an empty stomach.    Dispense:  4 tablet    Refill:  11     Follow up plan: Return in about 3 months (around 12/15/2021).  \

## 2021-09-17 LAB — NOVEL CORONAVIRUS, NAA: SARS-CoV-2, NAA: NOT DETECTED

## 2021-09-17 LAB — SARS-COV-2, NAA 2 DAY TAT

## 2021-09-19 ENCOUNTER — Other Ambulatory Visit: Payer: Self-pay | Admitting: Internal Medicine

## 2021-09-19 ENCOUNTER — Ambulatory Visit (INDEPENDENT_AMBULATORY_CARE_PROVIDER_SITE_OTHER): Payer: Medicare Other

## 2021-09-19 DIAGNOSIS — E785 Hyperlipidemia, unspecified: Secondary | ICD-10-CM

## 2021-09-19 DIAGNOSIS — I10 Essential (primary) hypertension: Secondary | ICD-10-CM

## 2021-09-19 DIAGNOSIS — R0989 Other specified symptoms and signs involving the circulatory and respiratory systems: Secondary | ICD-10-CM

## 2021-09-19 DIAGNOSIS — R051 Acute cough: Secondary | ICD-10-CM

## 2021-09-19 DIAGNOSIS — I2510 Atherosclerotic heart disease of native coronary artery without angina pectoris: Secondary | ICD-10-CM

## 2021-09-19 DIAGNOSIS — I2583 Coronary atherosclerosis due to lipid rich plaque: Secondary | ICD-10-CM

## 2021-09-19 NOTE — Progress Notes (Signed)
Please let pt know this was normal.

## 2021-09-19 NOTE — Patient Instructions (Signed)
Visit Information  Thank you for taking time to visit with me today. Please don't hesitate to contact me if I can be of assistance to you before our next scheduled telephone appointment.  Following are the goals we discussed today:  RNCM Clinical Goal(s):  Patient will verbalize basic understanding of CAD, HTN, and HLD disease process and self health management plan as evidenced by keeping appointments, following the plan of care and working with the pcp and CCM team to optimize plan of care for health and well being  demonstrate understanding of rationale for each prescribed medication as evidenced by compliance and calling for refills before running out of medications     attend all scheduled medical appointments: 11-10-2021 at 0820 am as evidenced by keeping appointments and calling the office for needed scheduled changes         demonstrate improved and ongoing health management independence as evidenced by stable conditions, stable VS and not acute exacerbations of conditions         demonstrate a decrease in CAD, HTN, and HLD exacerbations  as evidenced by managed chronic conditions and effective management of chronic conditions  demonstrate ongoing self health care management ability effectively manage chronic conditions  as evidenced by   working with the CCM team through collaboration with Consulting civil engineer, provider, and care team.    Interventions: 1:1 collaboration with primary care provider regarding development and update of comprehensive plan of care as evidenced by provider attestation and co-signature Inter-disciplinary care team collaboration (see longitudinal plan of care) Evaluation of current treatment plan related to  self management and patient's adherence to plan as established by provider     CAD  (Status: Goal on Track (progressing): YES.) Long Term Goal  Assessed understanding of CAD diagnosis Medications reviewed including medications utilized in CAD treatment  plan Provided education on importance of blood pressure control in management of CAD; Provided education on Importance of limiting foods high in cholesterol. 09-19-2021: Review and education provided Counseled on importance of regular laboratory monitoring as prescribed; Counseled on the importance of exercise goals with target of 150 minutes per week Reviewed Importance of taking all medications as prescribed Reviewed Importance of attending all scheduled provider appointments. 09-19-2021: Saw pcp last week and had COVID and Flu testing. The patient sees the pcp again in 2 weeks Advised to report any changes in symptoms or exercise tolerance Screening for signs and symptoms of depression related to chronic disease state;  Assessed social determinant of health barriers;    Hyperlipidemia:  (Status: Goal on Track (progressing): YES.) Long Term Goal       Lab Results  Component Value Date    CHOL 97 (L) 05/24/2021    HDL 34 (L) 05/24/2021    LDLCALC 46 05/24/2021    TRIG 86 05/24/2021    CHOLHDL 2.9 05/24/2021      Medication review performed; medication list updated in electronic medical record.  Provider established cholesterol goals reviewed; Counseled on importance of regular laboratory monitoring as prescribed. 09-19-2021: Education and review of regular lab testing; Provided HLD educational materials; Reviewed role and benefits of statin for ASCVD risk reduction; Discussed strategies to manage statin-induced myalgias; Reviewed importance of limiting foods high in cholesterol; Reviewed exercise goals and target of 150 minutes per week;   Hypertension: (Status: Goal on Track (progressing): YES.) Last practice recorded BP readings:     BP Readings from Last 3 Encounters:  09/16/21 (!) 150/80  07/07/21 128/60  03/03/21 130/75  Most  recent eGFR/CrCl:       Lab Results  Component Value Date    EGFR 55 (L) 05/24/2021    No components found for: CRCL   Evaluation of current  treatment plan related to hypertension self management and patient's adherence to plan as established by provider. 09-19-2021: The patient states that his blood pressures were likely up because he was not feeling the best on Friday. He was calling today to see about test results and let the RNCM know he was feeling better. Will continue to monitor for changes and new needs.    Provided education to patient re: stroke prevention, s/s of heart attack and stroke; Reviewed prescribed diet heart healthy diet  Reviewed medications with patient and discussed importance of compliance. 09-19-2021: Review and the patient is compliant with medications  Discussed plans with patient for ongoing care management follow up and provided patient with direct contact information for care management team; Advised patient, providing education and rationale, to monitor blood pressure daily and record, calling PCP for findings outside established parameters;  Advised patient to discuss blood pressure trends with provider; Provided education on prescribed diet heart healthy ;  Discussed complications of poorly controlled blood pressure such as heart disease, stroke, circulatory complications, vision complications, kidney impairment, sexual dysfunction;      Acute cough and respiratory sx and sx   (Status: Goal on Track (progressing): YES.) Short Term Goal  Evaluation of current treatment plan related to  acute cough and respiratory sx and sx ,  self-management and patient's adherence to plan as established by provider. 09-19-2021: Seen in the office on 09-16-2021. The patient placed an incoming call to the [facility] inquiring about his COVID testing from 09-16-2021. He states he is feeling better but had not received information from the office on the results of his COVID test. Review of results given to the patient. COVID testing from the chart is negative. In-basket message sent to pcp and clinical staff letting them know the patient had  been given the results and that he is feeling better. The patient states he will follow up with the pcp in 2 weeks.  Discussed plans with patient for ongoing care management follow up and provided patient with direct contact information for care management team Advised patient to call the office for new sx and sx of infection, questions, or concerns; Provided education to patient re: practicing good hygiene, wearing a mask when out in public, and following the recommendations of the provider.  The patient states his wife has been sick also and she had to go to her pcp and get medications as well. Education on the many respiratory and viral illnesses going on currently in the area. The patient will call for changes or needs. ; Discussed plans with patient for ongoing care management follow up and provided patient with direct contact information for care management team;    Patient Goals/Self-Care Activities: Take medications as prescribed   Attend all scheduled provider appointments Call pharmacy for medication refills 3-7 days in advance of running out of medications Attend church or other social activities Perform all self care activities independently  Perform IADL's (shopping, preparing meals, housekeeping, managing finances) independently Call provider office for new concerns or questions  Work with the social worker to address care coordination needs and will continue to work with the clinical team to address health care and disease management related needs call the Suicide and Crisis Lifeline: 988 call the [place] National Suicide Prevention Lifeline: (267) 434-1748 or  TTY: 7-116-579-0 TTY 925-271-5522) to talk to a trained counselor call 1-800-273-TALK (toll free, 24 hour hotline) if experiencing a Mental Health or [place]  check blood pressure weekly choose a place to take my blood pressure (home, clinic or office, retail store) write blood pressure results in a log or  diary learn about high blood pressure keep a blood pressure log take blood pressure log to all doctor appointments call doctor for signs and symptoms of high blood pressure develop an action plan for high blood pressure keep all doctor appointments take medications for blood pressure exactly as prescribed report new symptoms to your doctor eat more whole grains, fruits and vegetables, lean meats and healthy fats - call for medicine refill 2 or 3 days before it runs out - take all medications exactly as prescribed - call doctor with any symptoms you believe are related to your medicine - call doctor when you experience any new symptoms - go to all doctor appointments as scheduled - adhere to prescribed diet: heart healthy  Our next appointment is by telephone on 10-28-2021 at 230 pm  Please call the care guide team at [phone] if you need to cancel or reschedule your appointment.   If you are experiencing a Mental Health or [place] or need someone to talk to, please call the Suicide and Crisis Lifeline: 988 call the [place] National Suicide Prevention Lifeline: 602 238 9020 or TTY: 605-690-0050 TTY 548-203-6689) to talk to a trained counselor call 1-800-273-TALK (toll free, 24 hour hotline)   The patient verbalized understanding of instructions, educational materials, and care plan provided today and declined offer to receive copy of patient instructions, educational materials, and care plan.   Noreene Larsson RN, MSN, [facility] Mobile: [phone]

## 2021-09-19 NOTE — Chronic Care Management (AMB) (Signed)
Chronic Care Management   CCM RN Visit Note  09/19/2021 Name: Ricky Melton MRN: [id number] DOB: Jan 18, 1951  Subjective: Ricky Melton is a 71 y.o. year old male who is a primary care patient of Vigg, Avanti, MD. The care management team was consulted for assistance with disease management and care coordination needs.    Engaged with patient by telephone for follow up visit in response to provider referral for case management and/or care coordination services.   Consent to Services:  The patient was given information about Chronic Care Management services, agreed to services, and gave verbal consent prior to initiation of services.  Please see initial visit note for detailed documentation.   Patient agreed to services and verbal consent obtained.   Assessment: Review of patient past medical history, allergies, medications, health status, including review of consultants reports, laboratory and other test data, was performed as part of comprehensive evaluation and provision of chronic care management services.   SDOH (Social Determinants of Health) assessments and interventions performed:    CCM Care Plan  No Known Allergies  Outpatient Encounter Medications as of 09/19/2021  Medication Sig   albuterol (VENTOLIN HFA) 108 (90 Base) MCG/ACT inhaler    allopurinol (ZYLOPRIM) 300 MG tablet TAKE 1 TABLET BY MOUTH  DAILY   amLODipine (NORVASC) 10 MG tablet Take 1 tablet (10 mg total) by mouth every evening.   amoxicillin-clavulanate (AUGMENTIN) 875-125 MG tablet Take 1 tablet by mouth 2 (two) times daily.   aspirin 81 MG tablet Take 81 mg by mouth daily.   benazepril (LOTENSIN) 40 MG tablet Take 1 tablet (40 mg total) by mouth daily.   carvedilol (COREG) 25 MG tablet TAKE 1 TABLET BY MOUTH  TWICE DAILY WITH A MEAL   clopidogrel (PLAVIX) 75 MG tablet TAKE 1 TABLET BY MOUTH  DAILY   fexofenadine (ALLEGRA ALLERGY) 180 MG tablet Take 1 tablet (180 mg total) by mouth daily.   fluticasone  (FLONASE) 50 MCG/ACT nasal spray Place 2 sprays into both nostrils daily.   levothyroxine (SYNTHROID) 50 MCG tablet TAKE 1 TABLET BY MOUTH  DAILY   pantoprazole (PROTONIX) 40 MG tablet Take 1 tablet by mouth daily.   rosuvastatin (CRESTOR) 40 MG tablet TAKE 1 TABLET BY MOUTH  DAILY (Patient taking differently: Take 40 mg by mouth daily.)   No facility-administered encounter medications on file as of 09/19/2021.    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   CAD (coronary artery disease) 03/23/2015   Benign hematuria 03/23/2015   Hypertension 03/23/2015   Hyperlipidemia 03/23/2015   Hypothyroidism 03/23/2015   Gout 03/23/2015    Conditions to be addressed/monitored:CAD, HTN, HLD, and cough and upper respiratory sx and sx  Care Plan : RNCM: General Plan of Care (Adult) for Chronic Disease Management and Care Coordination Needs  Updates made by Vanita Ingles, RN since 09/19/2021 12:00 AM     Problem: RNCM: Development of Plan of Care for Chronic Disease Management (CAD, HLD, HTN)   Priority: High     Long-Range Goal: RNCM: Development of Plan of Care for Chronic Disease Management (CAD, HLD, HTN)   Start Date: 08/26/2021  Expected End Date: 08/26/2022  Note:   Current Barriers:  Knowledge Deficits related to plan of care for management of CAD, HTN, and HLD  Chronic Disease Management support and education needs related to CAD, HTN, and HLD  RNCM Clinical Goal(s):  Patient will verbalize basic understanding of CAD, HTN, and HLD disease process and self health management plan as evidenced by keeping  appointments, following the plan of care and working with the pcp and CCM team to optimize plan of care for health and well being  demonstrate understanding of rationale for each prescribed medication as evidenced by compliance and calling for refills before running out of medications     attend all scheduled medical appointments: 11-10-2021 at 0820 am as evidenced by keeping appointments and calling the office for needed scheduled changes         demonstrate improved and ongoing health management independence as evidenced by stable conditions, stable VS and not acute exacerbations of conditions         demonstrate a decrease in CAD, HTN, and HLD exacerbations  as evidenced by managed chronic conditions and effective management of chronic conditions  demonstrate ongoing self health care management ability effectively manage chronic conditions  as evidenced by   working with the CCM team through collaboration with Consulting civil engineer, provider, and care team.   Interventions: 1:1 collaboration with primary care provider regarding development and update of comprehensive plan of care as evidenced by provider attestation and co-signature Inter-disciplinary care team collaboration (see longitudinal plan of care) Evaluation of current treatment plan related to  self management and patient's adherence to plan as established by provider   CAD  (Status: Goal on Track (progressing): YES.) Long Term Goal  Assessed understanding of CAD diagnosis Medications reviewed including medications utilized in CAD treatment plan Provided education on importance of blood pressure control in management of CAD; Provided education on Importance of limiting foods high in cholesterol. 09-19-2021: Review and education provided Counseled on importance of regular laboratory monitoring as prescribed; Counseled on the importance of exercise goals with target of 150 minutes per week Reviewed Importance of taking all medications as  prescribed Reviewed Importance of attending all scheduled provider appointments. 09-19-2021: Saw pcp last week and had COVID and Flu testing. The patient sees the pcp again in 2 weeks Advised to report any changes in symptoms or exercise tolerance Screening for signs and symptoms of depression related to chronic disease state;  Assessed social determinant of health barriers;   Hyperlipidemia:  (Status: Goal on Track (progressing): YES.) Long Term Goal  Lab Results  Component Value Date   CHOL 97 (L) 05/24/2021   HDL 34 (L) 05/24/2021   LDLCALC 46 05/24/2021   TRIG 86 05/24/2021   CHOLHDL 2.9 05/24/2021     Medication review performed; medication list updated in electronic medical record.  Provider established cholesterol goals reviewed; Counseled on importance of regular laboratory monitoring as prescribed. 09-19-2021: Education and review of regular lab testing; Provided HLD educational materials; Reviewed role and benefits of statin for ASCVD risk reduction; Discussed strategies to manage statin-induced myalgias; Reviewed importance of limiting foods high in cholesterol; Reviewed exercise goals and target of 150 minutes per week;  Hypertension: (Status: Goal on Track (progressing): YES.) Last practice recorded BP readings:  BP Readings from Last 3 Encounters:  09/16/21 (!) 150/80  07/07/21 128/60  03/03/21 130/75  Most recent eGFR/CrCl:  Lab Results  Component Value Date   EGFR 55 (L) 05/24/2021    No components  found for: CRCL  Evaluation of current treatment plan related to hypertension self management and patient's adherence to plan as established by provider. 09-19-2021: The patient states that his blood pressures were likely up because he was not feeling the best on Friday. He was calling today to see about test results and let the RNCM know he was feeling better. Will continue to monitor for changes and new needs.    Provided education to patient re: stroke prevention, s/s of  heart attack and stroke; Reviewed prescribed diet heart healthy diet  Reviewed medications with patient and discussed importance of compliance. 09-19-2021: Review and the patient is compliant with medications  Discussed plans with patient for ongoing care management follow up and provided patient with direct contact information for care management team; Advised patient, providing education and rationale, to monitor blood pressure daily and record, calling PCP for findings outside established parameters;  Advised patient to discuss blood pressure trends with provider; Provided education on prescribed diet heart healthy ;  Discussed complications of poorly controlled blood pressure such as heart disease, stroke, circulatory complications, vision complications, kidney impairment, sexual dysfunction;    Acute cough and respiratory sx and sx   (Status: Goal on Track (progressing): YES.) Short Term Goal  Evaluation of current treatment plan related to  acute cough and respiratory sx and sx ,  self-management and patient's adherence to plan as established by provider. 09-19-2021: Seen in the office on 09-16-2021. The patient placed an incoming call to the [facility] inquiring about his COVID testing from 09-16-2021. He states he is feeling better but had not received information from the office on the results of his COVID test. Review of results given to the patient. COVID testing from the chart is negative. In-basket message sent to pcp and clinical staff letting them know the patient had been given the results and that he is feeling better. The patient states he will follow up with the pcp in 2 weeks.  Discussed plans with patient for ongoing care management follow up and provided patient with direct contact information for care management team Advised patient to call the office for new sx and sx of infection, questions, or concerns; Provided education to patient re: practicing good hygiene, wearing a mask when out in  public, and following the recommendations of the provider.  The patient states his wife has been sick also and she had to go to her pcp and get medications as well. Education on the many respiratory and viral illnesses going on currently in the area. The patient will call for changes or needs. ; Discussed plans with patient for ongoing care management follow up and provided patient with direct contact information for care management team;   Patient Goals/Self-Care Activities: Take medications as prescribed   Attend all scheduled provider appointments Call pharmacy for medication refills 3-7 days in advance of running out of medications Attend church or other social activities Perform all self care activities independently  Perform IADL's (shopping, preparing meals, housekeeping, managing finances) independently Call provider office for new concerns or questions  Work with the social worker to address care coordination needs and will continue to work with the clinical team to address health care and disease management related needs call the Suicide and Crisis Lifeline: 988 call the [place] National Suicide Prevention Lifeline: 938-835-5323 or TTY: (386)526-5505 TTY 3400933153) to talk to a trained counselor call 1-800-273-TALK (toll free, 24 hour hotline) if experiencing a Mental Health or [place]  check blood pressure  weekly choose a place to take my blood pressure (home, clinic or office, retail store) write blood pressure results in a log or diary learn about high blood pressure keep a blood pressure log take blood pressure log to all doctor appointments call doctor for signs and symptoms of high blood pressure develop an action plan for high blood pressure keep all doctor appointments take medications for blood pressure exactly as prescribed report new symptoms to your doctor eat more whole grains, fruits and vegetables, lean meats and healthy fats - call for medicine  refill 2 or 3 days before it runs out - take all medications exactly as prescribed - call doctor with any symptoms you believe are related to your medicine - call doctor when you experience any new symptoms - go to all doctor appointments as scheduled - adhere to prescribed diet: heart healthy       Plan:Telephone follow up appointment with care management team member scheduled for:  10-28-2021 at 230 pm  [place], MSN, [facility] Mobile: [phone]

## 2021-09-20 NOTE — Telephone Encounter (Signed)
Requested Prescriptions  Pending Prescriptions Disp Refills   clopidogrel (PLAVIX) 75 MG tablet [Pharmacy Med Name: Clopidogrel Bisulfate 75 MG Oral Tablet] 90 tablet 2    Sig: TAKE 1 TABLET BY MOUTH  DAILY     Hematology: Antiplatelets - clopidogrel Failed - 09/19/2021 10:18 PM      Failed - Evaluate AST, ALT within 2 months of therapy initiation.      Passed - ALT in normal range and within 360 days    ALT  Date Value Ref Range Status  05/24/2021 14 0 - 44 IU/L Final   ALT (SGPT) Piccolo, Waived  Date Value Ref Range Status  05/14/2019 22 10 - 47 U/L Final         Passed - AST in normal range and within 360 days    AST  Date Value Ref Range Status  05/24/2021 21 0 - 40 IU/L Final   AST (SGOT) Piccolo, Waived  Date Value Ref Range Status  05/14/2019 33 11 - 38 U/L Final         Passed - HCT in normal range and within 180 days    Hematocrit  Date Value Ref Range Status  05/24/2021 43.6 37.5 - 51.0 % Final         Passed - HGB in normal range and within 180 days    Hemoglobin  Date Value Ref Range Status  05/24/2021 14.0 13.0 - 17.7 g/dL Final         Passed - PLT in normal range and within 180 days    Platelets  Date Value Ref Range Status  05/24/2021 200 150 - 450 x10E3/uL Final         Passed - Valid encounter within last 6 months    Recent Outpatient Visits          4 days ago Acute cough   [place] Vigg, Avanti, MD   2 months ago Need for influenza vaccination   [facility] Vigg, Avanti, MD   6 months ago Hypothyroidism, unspecified type   [facility] Vigg, Avanti, MD   6 months ago Essential hypertension   [place] Vigg, Avanti, MD   7 months ago Elevated serum creatinine   [facility] Vigg, Avanti, MD      Future Appointments            In 1 week Vigg, Avanti, MD [place], [facility]   In 1 month Vigg, Avanti, MD [facility], [facility]   In 3 months  [place]            rosuvastatin (CRESTOR) 40 MG tablet [Pharmacy Med Name: Rosuvastatin Calcium 40 MG Oral Tablet] 90 tablet 2    Sig: TAKE 1 TABLET BY MOUTH  DAILY     Cardiovascular:  Antilipid - Statins Failed - 09/19/2021 10:18 PM      Failed - Total Cholesterol in normal range and within 360 days    Cholesterol, Total  Date Value Ref Range Status  05/24/2021 97 (L) 100 - 199 mg/dL Final   Cholesterol Piccolo, Waived  Date Value Ref Range Status  05/14/2019 98 <200 mg/dL Final    Comment:                            Desirable                <200  Borderline High      200- 239                         High                     >239          Failed - HDL in normal range and within 360 days    HDL  Date Value Ref Range Status  05/24/2021 34 (L) >39 mg/dL Final         Passed - LDL in normal range and within 360 days    LDL Chol Calc (NIH)  Date Value Ref Range Status  05/24/2021 46 0 - 99 mg/dL Final         Passed - Triglycerides in normal range and within 360 days    Triglycerides  Date Value Ref Range Status  05/24/2021 86 0 - 149 mg/dL Final   Triglycerides Piccolo,Waived  Date Value Ref Range Status  05/14/2019 205 (H) <150 mg/dL Final    Comment:                            Normal                   <150                         Borderline High     150 - 199                         High                200 - 499                         Very High                >499          Passed - Patient is not pregnant      Passed - Valid encounter within last 12 months    Recent Outpatient Visits          4 days ago Acute cough   [facility] Vigg, Avanti, MD   2 months ago Need for influenza vaccination   [facility] Vigg, Avanti, MD   6 months ago Hypothyroidism, unspecified type   [facility] Vigg, Avanti, MD   6 months ago Essential hypertension   [place] Vigg, Avanti,  MD   7 months ago Elevated serum creatinine   [facility] Vigg, Avanti, MD      Future Appointments            In 1 week Vigg, Avanti, MD [place], [facility]   In 1 month Vigg, Avanti, MD [facility], [facility]   In 3 months  [facility], [facility]            carvedilol (COREG) 25 MG tablet [employer] Med Name: Carvedilol 25 MG Oral Tablet] 180 tablet 0    Sig: TAKE 1 TABLET BY MOUTH  TWICE DAILY WITH A MEAL     Cardiovascular:  Beta Blockers Failed - 09/19/2021 10:18 PM      Failed - Last BP in normal range    BP Readings from Last 1 Encounters:  09/16/21 (!) 150/80         Passed - Last Heart Rate in normal range    Pulse Readings from Last 1 Encounters:  09/16/21 69         Passed - Valid encounter within last 6 months    Recent Outpatient Visits          4 days ago Acute cough   [facility] Vigg, Avanti, MD   2 months ago Need for influenza vaccination   [facility] Vigg, Avanti, MD   6 months ago Hypothyroidism, unspecified type   [facility] Vigg, Avanti, MD   6 months ago Essential hypertension   [place] Vigg, Avanti, MD   7 months ago Elevated serum creatinine   [place], MD      Future Appointments            In 1 week Vigg, Avanti, MD [facility], [place]   In 1 month Vigg, Avanti, MD [facility], [place]   In 3 months  [employer], [place]

## 2021-09-29 DIAGNOSIS — N1832 Chronic kidney disease, stage 3b: Secondary | ICD-10-CM | POA: Diagnosis not present

## 2021-09-29 DIAGNOSIS — R8290 Unspecified abnormal findings in urine: Secondary | ICD-10-CM | POA: Diagnosis not present

## 2021-09-29 DIAGNOSIS — M1000 Idiopathic gout, unspecified site: Secondary | ICD-10-CM | POA: Diagnosis not present

## 2021-09-29 DIAGNOSIS — I10 Essential (primary) hypertension: Secondary | ICD-10-CM | POA: Diagnosis not present

## 2021-09-29 DIAGNOSIS — R809 Proteinuria, unspecified: Secondary | ICD-10-CM | POA: Diagnosis not present

## 2021-09-30 ENCOUNTER — Ambulatory Visit (INDEPENDENT_AMBULATORY_CARE_PROVIDER_SITE_OTHER): Payer: Medicare Other | Admitting: Nurse Practitioner

## 2021-09-30 ENCOUNTER — Other Ambulatory Visit: Payer: Self-pay

## 2021-09-30 ENCOUNTER — Encounter: Payer: Self-pay | Admitting: Nurse Practitioner

## 2021-09-30 ENCOUNTER — Ambulatory Visit: Payer: Medicare Other | Admitting: Internal Medicine

## 2021-09-30 VITALS — BP 158/82 | HR 64 | Temp 98.2°F | Wt 222.8 lb

## 2021-09-30 DIAGNOSIS — I10 Essential (primary) hypertension: Secondary | ICD-10-CM

## 2021-09-30 NOTE — Assessment & Plan Note (Signed)
Chronic. Not well controlled.  Has only been on increased dose of Benzepril for 2 weeks.  Will give patient blood pressure log to keep track at home. States he is waking up feeling a little whoozy.  Has appointment in 6 weeks with PCP. Discussed that he should call the office sooner if blood pressure is >140/90 consistently.  Discussed s/s related to blood pressure.

## 2021-09-30 NOTE — Progress Notes (Signed)
BP (!) 158/82    Pulse 64    Temp 98.2 F (36.8 C) (Oral)    Wt 222 lb 12.8 oz (101.1 kg)    SpO2 98%    BMI 34.89 kg/m    Subjective:    Patient ID: Ricky Melton, male    DOB: 29-Aug-1951, 71 y.o.   MRN: [id number]  HPI: Ricky Melton is a 71 y.o. male  Chief Complaint  Patient presents with   Hypertension    2 week f/up    HYPERTENSION Hypertension status: uncontrolled  Satisfied with current treatment? no Duration of hypertension: years BP monitoring frequency:  not checking BP range:  BP medication side effects:  no Medication compliance: excellent compliance Previous BP meds:amlodipine, benazepril, and carvedilol Aspirin: yes Recurrent headaches: no Visual changes: no Palpitations: no Dyspnea: no Chest pain: no Lower extremity edema: no Dizzy/lightheaded: no  Relevant past medical, surgical, family and social history reviewed and updated as indicated. Interim medical history since our last visit reviewed. Allergies and medications reviewed and updated.  Review of Systems  Eyes:  Negative for visual disturbance.  Respiratory:  Negative for chest tightness and shortness of breath.   Cardiovascular:  Negative for chest pain, palpitations and leg swelling.  Neurological:  Negative for dizziness, light-headedness and headaches.   Per HPI unless specifically indicated above     Objective:    BP (!) 158/82    Pulse 64    Temp 98.2 F (36.8 C) (Oral)    Wt 222 lb 12.8 oz (101.1 kg)    SpO2 98%    BMI 34.89 kg/m   Wt Readings from Last 3 Encounters:  09/30/21 222 lb 12.8 oz (101.1 kg)  09/16/21 226 lb 9.6 oz (102.8 kg)  07/07/21 220 lb 4 oz (99.9 kg)    Physical Exam Vitals and nursing note reviewed.  Constitutional:      General: He is not in acute distress.    Appearance: Normal appearance. He is not ill-appearing, toxic-appearing or diaphoretic.  HENT:     Head: Normocephalic.     Right Ear: External ear normal.     Left Ear: External ear normal.      Nose: Nose normal. No congestion or rhinorrhea.     Mouth/Throat:     Mouth: Mucous membranes are moist.  Eyes:     General:        Right eye: No discharge.        Left eye: No discharge.     Extraocular Movements: Extraocular movements intact.     Conjunctiva/sclera: Conjunctivae normal.     Pupils: Pupils are equal, round, and reactive to light.  Cardiovascular:     Rate and Rhythm: Normal rate and regular rhythm.     Heart sounds: No murmur heard. Pulmonary:     Effort: Pulmonary effort is normal. No respiratory distress.     Breath sounds: Normal breath sounds. No wheezing, rhonchi or rales.  Abdominal:     General: Abdomen is flat. Bowel sounds are normal.  Musculoskeletal:     Cervical back: Normal range of motion and neck supple.  Skin:    General: Skin is warm and dry.     Capillary Refill: Capillary refill takes less than 2 seconds.  Neurological:     General: No focal deficit present.     Mental Status: He is alert and oriented to person, place, and time.  Psychiatric:        Mood and Affect: Mood normal.  Behavior: Behavior normal.        Thought Content: Thought content normal.        Judgment: Judgment normal.    Results for orders placed or performed in visit on 09/16/21  Novel Coronavirus, NAA ([facility])   Specimen: Nasopharyngeal(NP) swabs in vial transport medium  Result Value Ref Range   SARS-CoV-2, NAA Not Detected Not Detected  SARS-COV-2, NAA 2 DAY TAT  Result Value Ref Range   SARS-CoV-2, NAA 2 DAY TAT Performed   Veritor Flu A/B Waived  Result Value Ref Range   Influenza A Negative Negative   Influenza B Negative Negative      Assessment & Plan:   Problem List Items Addressed This Visit       Cardiovascular and Mediastinum   Essential hypertension - Primary    Chronic. Not well controlled.  Has only been on increased dose of Benzepril for 2 weeks.  Will give patient blood pressure log to keep track at home. States he is waking up  feeling a little whoozy.  Has appointment in 6 weeks with PCP. Discussed that he should call the office sooner if blood pressure is >140/90 consistently.  Discussed s/s related to blood pressure.         Follow up plan: Return if symptoms worsen or fail to improve.

## 2021-10-06 DIAGNOSIS — I10 Essential (primary) hypertension: Secondary | ICD-10-CM | POA: Diagnosis not present

## 2021-10-06 DIAGNOSIS — R809 Proteinuria, unspecified: Secondary | ICD-10-CM | POA: Diagnosis not present

## 2021-10-06 DIAGNOSIS — N281 Cyst of kidney, acquired: Secondary | ICD-10-CM | POA: Diagnosis not present

## 2021-10-06 DIAGNOSIS — M1000 Idiopathic gout, unspecified site: Secondary | ICD-10-CM | POA: Diagnosis not present

## 2021-10-06 DIAGNOSIS — N1832 Chronic kidney disease, stage 3b: Secondary | ICD-10-CM | POA: Diagnosis not present

## 2021-10-11 DIAGNOSIS — I2583 Coronary atherosclerosis due to lipid rich plaque: Secondary | ICD-10-CM

## 2021-10-11 DIAGNOSIS — I2510 Atherosclerotic heart disease of native coronary artery without angina pectoris: Secondary | ICD-10-CM

## 2021-10-11 DIAGNOSIS — I10 Essential (primary) hypertension: Secondary | ICD-10-CM

## 2021-10-11 DIAGNOSIS — E785 Hyperlipidemia, unspecified: Secondary | ICD-10-CM

## 2021-10-13 ENCOUNTER — Ambulatory Visit (INDEPENDENT_AMBULATORY_CARE_PROVIDER_SITE_OTHER): Payer: Medicare Other | Admitting: Family Medicine

## 2021-10-13 ENCOUNTER — Ambulatory Visit: Payer: Self-pay | Admitting: *Deleted

## 2021-10-13 ENCOUNTER — Other Ambulatory Visit: Payer: Self-pay

## 2021-10-13 ENCOUNTER — Encounter: Payer: Self-pay | Admitting: Family Medicine

## 2021-10-13 VITALS — Temp 98.0°F | Wt 225.6 lb

## 2021-10-13 DIAGNOSIS — I10 Essential (primary) hypertension: Secondary | ICD-10-CM

## 2021-10-13 DIAGNOSIS — M545 Low back pain, unspecified: Secondary | ICD-10-CM

## 2021-10-13 DIAGNOSIS — R001 Bradycardia, unspecified: Secondary | ICD-10-CM

## 2021-10-13 DIAGNOSIS — R42 Dizziness and giddiness: Secondary | ICD-10-CM

## 2021-10-13 MED ORDER — CARVEDILOL 12.5 MG PO TABS: 12.5000 mg | ORAL_TABLET | Freq: Two times a day (BID) | ORAL | 1 refills | Status: DC

## 2021-10-13 MED ORDER — BENAZEPRIL HCL 20 MG PO TABS: 20.0000 mg | ORAL_TABLET | Freq: Every day | ORAL | 1 refills | Status: DC

## 2021-10-13 MED ORDER — HYDROCHLOROTHIAZIDE 25 MG PO TABS: 25.0000 mg | ORAL_TABLET | Freq: Every day | ORAL | 3 refills | Status: DC

## 2021-10-13 MED ORDER — MECLIZINE HCL 25 MG PO TABS: 25.0000 mg | ORAL_TABLET | Freq: Three times a day (TID) | ORAL | 0 refills | Status: DC | PRN

## 2021-10-13 NOTE — Telephone Encounter (Signed)
°  Chief Complaint: dizziness Symptoms: dizziness, doesn't feel clear in his head- patient reports recent change in BP medication- low pulse readings 59-61 Frequency: 1 week Pertinent Negatives: Patient denies fever, chest pain, vomiting, diarrhea, bleeding Disposition: [] ED /[] Urgent Care (no appt availability in office) / [x] Appointment(In office/virtual)/ []  [place] [facility]/ [] Home Care/ [] Refused Recommended Disposition /[] [place] [facility]/ []  Follow-up with PCP Additional Notes: Call to office- Ubaldo Glassing- permission to book with other provider- request message for PCP review

## 2021-10-13 NOTE — Telephone Encounter (Signed)
Reason for Disposition  Taking a medicine that could cause dizziness (e.g., blood pressure medications, diuretics)  Answer Assessment - Initial Assessment Questions 1. DESCRIPTION: "Describe your dizziness."     Feels lightheaded 2. LIGHTHEADED: "Do you feel lightheaded?" (e.g., somewhat faint, woozy, weak upon standing)     Off balance 3. VERTIGO: "Do you feel like either you or the room is spinning or tilting?" (i.e. vertigo)     no 4. SEVERITY: "How bad is it?"  "Do you feel like you are going to faint?" "Can you stand and walk?"   - MILD: Feels slightly dizzy, but walking normally.   - MODERATE: Feels unsteady when walking, but not falling; interferes with normal activities (e.g., school, work).   - SEVERE: Unable to walk without falling, or requires assistance to walk without falling; feels like passing out now.      Mild 5. ONSET:  "When did the dizziness begin?"     1 week 6. AGGRAVATING FACTORS: "Does anything make it worse?" (e.g., standing, change in head position)     Moving, driving 7. HEART RATE: "Can you tell me your heart rate?" "How many beats in 15 seconds?"  (Note: not all patients can do this)       59-60's 8. CAUSE: "What do you think is causing the dizziness?"     Change in BP medication 9. RECURRENT SYMPTOM: "Have you had dizziness before?" If Yes, ask: "When was the last time?" "What happened that time?"     *No Answer* 10. OTHER SYMPTOMS: "Do you have any other symptoms?" (e.g., fever, chest pain, vomiting, diarrhea, bleeding)       Low L side pain  11. PREGNANCY: "Is there any chance you are pregnant?" "When was your last menstrual period?"       *No Answer*  Protocols used: Dizziness - Lightheadedness-A-AH

## 2021-10-13 NOTE — Assessment & Plan Note (Addendum)
Still running high. Feeling bad on 40mg  benazepril. Already on carvedilol and amlodipine. Will drop him back to 20mg  and add hctz. Watch kidney function closely. May need hydralazine rather than HCTZ if BP not controlled.

## 2021-10-13 NOTE — Progress Notes (Signed)
BP: 158/73 146/81 standing, 163/87 supine Temp 98 F (36.7 C)    Wt 225 lb 9.6 oz (102.3 kg)    SpO2 99%    BMI 35.33 kg/m    Subjective:    Patient ID: Ricky Melton, male    DOB: Apr 07, 1951, 71 y.o.   MRN: [id number]  HPI: Ricky Melton is a 71 y.o. male  Chief Complaint  Patient presents with   Hypertension    Patient states he is still feeling whoozy, he feels unbalanced. Patient states some days his BP is high and some days normal.    DIZZINESS Duration: about a month ago when he started the benazepril Description of symptoms: lightheaded Duration of episode:  mainly with driving Dizziness frequency: with driving and moving his head Provoking factors: moving his head Triggered by rolling over in bed:  unsure Triggered by bending over: yes Aggravated by head movement: yes Aggravated by exertion, coughing, loud noises: no Recent head injury: no Recent or current viral symptoms: yes History of vasovagal episodes: no Nausea: no Vomiting: no Tinnitus: no Hearing loss: chronic Aural fullness: no Headache: no Photophobia/phonophobia: no Unsteady gait: no Postural instability: no Diplopia: yes Dysarthria, dysphagia or weakness: no Related to exertion: no Pallor: no Diaphoresis: no Dyspnea: no Chest pain: no  HYPERTENSION Hypertension status: uncontrolled  Satisfied with current treatment? no Duration of hypertension: chronic BP monitoring frequency:  a few times a week BP range:  BP medication side effects:  yes Medication compliance: good compliance Previous BP meds: amlodipine, benazepril, carvedilol Aspirin: no Recurrent headaches: no Visual changes: no Palpitations: no Dyspnea: no Chest pain: no Lower extremity edema: no Dizzy/lightheaded: yes   Relevant past medical, surgical, family and social history reviewed and updated as indicated. Interim medical history since our last visit reviewed. Allergies and medications reviewed and  updated.  Review of Systems  Constitutional: Negative.   Respiratory: Negative.    Cardiovascular: Negative.   Gastrointestinal: Negative.   Musculoskeletal:  Positive for back pain and myalgias. Negative for arthralgias, gait problem, joint swelling, neck pain and neck stiffness.  Skin: Negative.   Neurological:  Positive for dizziness and light-headedness. Negative for tremors, seizures, syncope, facial asymmetry, speech difficulty, weakness, numbness and headaches.  Psychiatric/Behavioral: Negative.     Per HPI unless specifically indicated above     Objective:   BP: 158/73 146/81 standing, 163/87 supine Temp 98 F (36.7 C)    Wt 225 lb 9.6 oz (102.3 kg)    SpO2 99%    BMI 35.33 kg/m   Wt Readings from Last 3 Encounters:  10/13/21 225 lb 9.6 oz (102.3 kg)  09/30/21 222 lb 12.8 oz (101.1 kg)  09/16/21 226 lb 9.6 oz (102.8 kg)    Physical Exam Vitals and nursing note reviewed.  Constitutional:      General: He is not in acute distress.    Appearance: Normal appearance. He is not ill-appearing, toxic-appearing or diaphoretic.  HENT:     Head: Normocephalic and atraumatic.     Right Ear: External ear normal.     Left Ear: External ear normal.     Nose: Nose normal.     Mouth/Throat:     Mouth: Mucous membranes are moist.     Pharynx: Oropharynx is clear.  Eyes:     General: No scleral icterus.       Right eye: No discharge.        Left eye: No discharge.     Extraocular Movements: Extraocular  movements intact.     Left eye: Nystagmus present.     Conjunctiva/sclera: Conjunctivae normal.     Pupils: Pupils are equal, round, and reactive to light.  Cardiovascular:     Rate and Rhythm: Normal rate and regular rhythm.     Pulses: Normal pulses.     Heart sounds: Normal heart sounds. No murmur heard.   No friction rub. No gallop.  Pulmonary:     Effort: Pulmonary effort is normal. No respiratory distress.     Breath sounds: Normal breath sounds. No stridor. No wheezing,  rhonchi or rales.  Chest:     Chest wall: No tenderness.  Musculoskeletal:        General: Normal range of motion.     Cervical back: Normal range of motion and neck supple.  Skin:    General: Skin is warm and dry.     Capillary Refill: Capillary refill takes less than 2 seconds.     Coloration: Skin is not jaundiced or pale.     Findings: No bruising, erythema, lesion or rash.  Neurological:     General: No focal deficit present.     Mental Status: He is alert and oriented to person, place, and time. Mental status is at baseline.  Psychiatric:        Mood and Affect: Mood normal.        Behavior: Behavior normal.        Thought Content: Thought content normal.        Judgment: Judgment normal.    Results for orders placed or performed in visit on 09/16/21  Novel Coronavirus, NAA ([facility])   Specimen: Nasopharyngeal(NP) swabs in vial transport medium  Result Value Ref Range   SARS-CoV-2, NAA Not Detected Not Detected  SARS-COV-2, NAA 2 DAY TAT  Result Value Ref Range   SARS-CoV-2, NAA 2 DAY TAT Performed   Veritor Flu A/B Waived  Result Value Ref Range   Influenza A Negative Negative   Influenza B Negative Negative      Assessment & Plan:   Problem List Items Addressed This Visit       Cardiovascular and Mediastinum   Essential hypertension    Still running high. Feeling bad on 40mg  benazepril. Already on carvedilol and amlodipine. Will drop him back to 20mg  and add hctz. Watch kidney function closely. May need hydralazine rather than HCTZ if BP not controlled.       Relevant Medications   benazepril (LOTENSIN) 20 MG tablet   hydrochlorothiazide (HYDRODIURIL) 25 MG tablet   carvedilol (COREG) 12.5 MG tablet   Other Relevant Orders   EKG 12-Lead (Completed)   Other Visit Diagnoses     Vertigo    -  Primary   + nystagmus. Will start epley manuvers and meclizine. If not better in 1 week, will refer to PT. Continue to monitor.    Relevant Orders   EKG 12-Lead  (Completed)   Acute left-sided low back pain without sciatica       Due to lifting something yesterday- stretches given. Call if not improving.    Dizziness       Not orthostatic. + brady, EKG NSR, + nystagmus. No other neurologic signs. Will treat for vertigo- if not better in 1 wk consider imaging to r/o occult stroke.   Bradycardia       Will cut his carvedilol in 1/2 and recheck 1 month.        Follow up plan: Return 2 weeks with me or  Dr. Neomia Dear.

## 2021-10-13 NOTE — Progress Notes (Signed)
Interpreted by me on 10/13/21. Bradycardia at 55bpm, no ST segment changes

## 2021-10-14 ENCOUNTER — Ambulatory Visit: Payer: Medicare Other | Admitting: Internal Medicine

## 2021-10-27 ENCOUNTER — Other Ambulatory Visit: Payer: Self-pay

## 2021-10-27 ENCOUNTER — Encounter: Payer: Self-pay | Admitting: Family Medicine

## 2021-10-27 ENCOUNTER — Ambulatory Visit (INDEPENDENT_AMBULATORY_CARE_PROVIDER_SITE_OTHER): Payer: Medicare Other | Admitting: Family Medicine

## 2021-10-27 VITALS — BP 136/72 | HR 62 | Temp 97.9°F | Wt 222.0 lb

## 2021-10-27 DIAGNOSIS — F419 Anxiety disorder, unspecified: Secondary | ICD-10-CM

## 2021-10-27 DIAGNOSIS — I10 Essential (primary) hypertension: Secondary | ICD-10-CM

## 2021-10-27 DIAGNOSIS — R42 Dizziness and giddiness: Secondary | ICD-10-CM

## 2021-10-27 MED ORDER — FEXOFENADINE HCL 180 MG PO TABS: 180.0000 mg | ORAL_TABLET | Freq: Every day | ORAL | 3 refills | Status: DC

## 2021-10-27 MED ORDER — LORAZEPAM 0.5 MG PO TABS: 0.5000 mg | ORAL_TABLET | Freq: Two times a day (BID) | ORAL | 1 refills | Status: DC | PRN

## 2021-10-27 NOTE — Assessment & Plan Note (Signed)
Doing well off HCTZ at this time. Will leave him on current regimen. Continue to monitor. Checking BMP today.

## 2021-10-27 NOTE — Progress Notes (Signed)
BP 136/72    Pulse 62    Temp 97.9 F (36.6 C)    Wt 222 lb (100.7 kg)    SpO2 100%    BMI 34.76 kg/m    Subjective:    Patient ID: Ricky Melton, male    DOB: 30-Jun-1951, 71 y.o.   MRN: [id number]  HPI: Ricky Melton is a 70 y.o. male  Chief Complaint  Patient presents with   Hypertension    Patient quit taking HCTZ, states it was making him feel anxious and like his heart was racing.    Dizziness   ANXIETY/STRESS- Mom is going home on hospice in the next couple of days. He is very anxious. Not feeling good Duration: few weeks Status:exacerbated Anxious mood: yes  Excessive worrying: yes Irritability: no  Sweating: no Nausea: no Palpitations:yes Hyperventilation: no Panic attacks: no Agoraphobia: no  Obscessions/compulsions: no Depressed mood: no Depression screen [facility] 2/9 10/27/2021 10/13/2021 09/30/2021 09/16/2021 07/07/2021  Decreased Interest 0 0 0 0 0  Down, Depressed, Hopeless 0 0 0 0 0  PHQ - 2 Score 0 0 0 0 0  Altered sleeping 0 0 0 0 0  Tired, decreased energy 0 0 0 0 0  Change in appetite 0 0 0 0 0  Feeling bad or failure about yourself  0 0 0 0 0  Trouble concentrating 0 0 0 0 0  Moving slowly or fidgety/restless 0 0 0 0 0  Suicidal thoughts 0 0 0 0 0  PHQ-9 Score 0 0 0 0 0  Difficult doing work/chores - - Not difficult at all - Not difficult at all  Some recent data might be hidden   Anhedonia: no Weight changes: no Insomnia: no   Hypersomnia: no Fatigue/loss of energy: yes Feelings of worthlessness: no Feelings of guilt: yes Impaired concentration/indecisiveness: no Suicidal ideations: no  Crying spells: yes Recent Stressors/Life Changes: yes   Relationship problems: no   Family stress: yes     Financial stress: no    Job stress: no    Recent death/loss: no  HYPERTENSION Hypertension status: stable  Satisfied with current treatment? yes Duration of hypertension: chronic BP monitoring frequency:  a few times a day BP medication side  effects:  no Medication compliance: good compliance Previous BP meds:benazepril, amlodipine, HCTZ, carvedilol Aspirin: yes Recurrent headaches: no Visual changes: no Palpitations: no Dyspnea: no Chest pain: no Lower extremity edema: no Dizzy/lightheaded: yes   Relevant past medical, surgical, family and social history reviewed and updated as indicated. Interim medical history since our last visit reviewed. Allergies and medications reviewed and updated.  Review of Systems  Constitutional: Negative.   Respiratory: Negative.    Cardiovascular: Negative.   Gastrointestinal: Negative.   Musculoskeletal: Negative.   Skin: Negative.   Neurological:  Positive for dizziness. Negative for tremors, seizures, syncope, facial asymmetry, speech difficulty, weakness, light-headedness, numbness and headaches.  Hematological: Negative.   Psychiatric/Behavioral:  Negative for agitation, behavioral problems, confusion, decreased concentration, dysphoric mood, hallucinations, self-injury, sleep disturbance and suicidal ideas. The patient is nervous/anxious. The patient is not hyperactive.    Per HPI unless specifically indicated above     Objective:    BP 136/72    Pulse 62    Temp 97.9 F (36.6 C)    Wt 222 lb (100.7 kg)    SpO2 100%    BMI 34.76 kg/m   Wt Readings from Last 3 Encounters:  10/27/21 222 lb (100.7 kg)  10/13/21 225 lb 9.6 oz (  102.3 kg)  09/30/21 222 lb 12.8 oz (101.1 kg)    Physical Exam Vitals and nursing note reviewed.  Constitutional:      General: He is not in acute distress.    Appearance: Normal appearance. He is not ill-appearing, toxic-appearing or diaphoretic.  HENT:     Head: Normocephalic and atraumatic.     Right Ear: External ear normal.     Left Ear: External ear normal.     Nose: Nose normal.     Mouth/Throat:     Mouth: Mucous membranes are moist.     Pharynx: Oropharynx is clear.  Eyes:     General: No scleral icterus.       Right eye: No  discharge.        Left eye: No discharge.     Extraocular Movements: Extraocular movements intact.     Conjunctiva/sclera: Conjunctivae normal.     Pupils: Pupils are equal, round, and reactive to light.  Cardiovascular:     Rate and Rhythm: Normal rate and regular rhythm.     Pulses: Normal pulses.     Heart sounds: Normal heart sounds. No murmur heard.   No friction rub. No gallop.  Pulmonary:     Effort: Pulmonary effort is normal. No respiratory distress.     Breath sounds: Normal breath sounds. No stridor. No wheezing, rhonchi or rales.  Chest:     Chest wall: No tenderness.  Musculoskeletal:        General: Normal range of motion.     Cervical back: Normal range of motion and neck supple.  Skin:    General: Skin is warm and dry.     Capillary Refill: Capillary refill takes less than 2 seconds.     Coloration: Skin is not jaundiced or pale.     Findings: No bruising, erythema, lesion or rash.  Neurological:     General: No focal deficit present.     Mental Status: He is alert and oriented to person, place, and time. Mental status is at baseline.  Psychiatric:        Mood and Affect: Mood normal.        Behavior: Behavior normal.        Thought Content: Thought content normal.        Judgment: Judgment normal.    Results for orders placed or performed in visit on 09/16/21  Novel Coronavirus, NAA ([facility])   Specimen: Nasopharyngeal(NP) swabs in vial transport medium  Result Value Ref Range   SARS-CoV-2, NAA Not Detected Not Detected  SARS-COV-2, NAA 2 DAY TAT  Result Value Ref Range   SARS-CoV-2, NAA 2 DAY TAT Performed   Veritor Flu A/B Waived  Result Value Ref Range   Influenza A Negative Negative   Influenza B Negative Negative      Assessment & Plan:   Problem List Items Addressed This Visit       Cardiovascular and Mediastinum   Essential hypertension    Doing well off HCTZ at this time. Will leave him on current regimen. Continue to monitor. Checking  BMP today.      Relevant Orders   Basic metabolic panel   Other Visit Diagnoses     Acute anxiety    -  Primary   Mother is dying. Will send PRN lorazepam. Continue to monitor. Call with any concerns. Recheck 1 month.   Relevant Medications   LORazepam (ATIVAN) 0.5 MG tablet   Vertigo       Doing OK  right now. Will let us know if getting worse and we'll get him into PT.         Follow up plan: Return in about 4 weeks (around 11/24/2021) for OK with me.

## 2021-10-28 ENCOUNTER — Telehealth: Payer: Medicare Other

## 2021-10-28 ENCOUNTER — Ambulatory Visit (INDEPENDENT_AMBULATORY_CARE_PROVIDER_SITE_OTHER): Payer: Medicare Other

## 2021-10-28 DIAGNOSIS — E785 Hyperlipidemia, unspecified: Secondary | ICD-10-CM

## 2021-10-28 DIAGNOSIS — I2510 Atherosclerotic heart disease of native coronary artery without angina pectoris: Secondary | ICD-10-CM

## 2021-10-28 DIAGNOSIS — I2583 Coronary atherosclerosis due to lipid rich plaque: Secondary | ICD-10-CM

## 2021-10-28 DIAGNOSIS — I10 Essential (primary) hypertension: Secondary | ICD-10-CM

## 2021-10-28 LAB — BASIC METABOLIC PANEL
BUN/Creatinine Ratio: 19 (ref 10–24)
BUN: 28 mg/dL — ABNORMAL HIGH (ref 8–27)
CO2: 22 mmol/L (ref 20–29)
Calcium: 10.1 mg/dL (ref 8.6–10.2)
Chloride: 102 mmol/L (ref 96–106)
Creatinine, Ser: 1.44 mg/dL — ABNORMAL HIGH (ref 0.76–1.27)
Glucose: 101 mg/dL — ABNORMAL HIGH (ref 70–99)
Potassium: 5.2 mmol/L (ref 3.5–5.2)
Sodium: 144 mmol/L (ref 134–144)
eGFR: 52 mL/min/{1.73_m2} — ABNORMAL LOW (ref >59)

## 2021-10-28 NOTE — Chronic Care Management (AMB) (Signed)
Chronic Care Management   CCM RN Visit Note  10/28/2021 Name: Ricky Melton MRN: [id number] DOB: 29-Sep-1950  Subjective: Ricky Melton is a 71 y.o. year old male who is a primary care patient of Valerie Roys, DO. The care management team was consulted for assistance with disease management and care coordination needs.    Engaged with patient by telephone for follow up visit in response to provider referral for case management and/or care coordination services.   Consent to Services:  The patient was given information about Chronic Care Management services, agreed to services, and gave verbal consent prior to initiation of services.  Please see initial visit note for detailed documentation.   Patient agreed to services and verbal consent obtained.   Assessment: Review of patient past medical history, allergies, medications, health status, including review of consultants reports, laboratory and other test data, was performed as part of comprehensive evaluation and provision of chronic care management services.   SDOH (Social Determinants of Health) assessments and interventions performed:    CCM Care Plan  Allergies  Allergen Reactions   Hydrochlorothiazide Other (See Comments)    dizziness    Outpatient Encounter Medications as of 10/28/2021  Medication Sig   albuterol (VENTOLIN HFA) 108 (90 Base) MCG/ACT inhaler  (Patient not taking: Reported on 10/27/2021)   alendronate (FOSAMAX) 70 MG tablet Take 70 mg by mouth once a week. (Patient not taking: Reported on 10/27/2021)   allopurinol (ZYLOPRIM) 300 MG tablet TAKE 1 TABLET BY MOUTH  DAILY   amLODipine (NORVASC) 10 MG tablet Take 1 tablet (10 mg total) by mouth every evening.   aspirin 81 MG tablet Take 81 mg by mouth daily.   benazepril (LOTENSIN) 20 MG tablet Take 1 tablet (20 mg total) by mouth daily.   carvedilol (COREG) 12.5 MG tablet Take 1 tablet (12.5 mg total) by mouth 2 (two) times daily with a meal.   clopidogrel  (PLAVIX) 75 MG tablet TAKE 1 TABLET BY MOUTH  DAILY   fexofenadine (ALLEGRA) 180 MG tablet Take 1 tablet (180 mg total) by mouth daily.   fluticasone (FLONASE) 50 MCG/ACT nasal spray Place 2 sprays into both nostrils daily.   levothyroxine (SYNTHROID) 50 MCG tablet TAKE 1 TABLET BY MOUTH  DAILY   LORazepam (ATIVAN) 0.5 MG tablet Take 1 tablet (0.5 mg total) by mouth 2 (two) times daily as needed for anxiety.   meclizine (ANTIVERT) 25 MG tablet Take 1 tablet (25 mg total) by mouth 3 (three) times daily as needed for dizziness.   rosuvastatin (CRESTOR) 40 MG tablet TAKE 1 TABLET BY MOUTH  DAILY   No facility-administered encounter medications on file as of 10/28/2021.    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   CAD (coronary artery disease) 03/23/2015   Benign hematuria 03/23/2015   Hyperlipidemia 03/23/2015   Hypothyroidism 03/23/2015   Gout 03/23/2015    Conditions to be addressed/monitored:CAD, HTN, and HLD  Care Plan : RNCM: General Plan of Care (Adult) for Chronic Disease Management and Care Coordination Needs  Updates made by Vanita Ingles, RN since 10/28/2021 12:00 AM     Problem: RNCM: Development of Plan of Care for Chronic Disease Management (CAD, HLD, HTN)   Priority: High  Long-Range Goal: RNCM: Development of Plan of Care for Chronic Disease Management (CAD, HLD, HTN)   Start Date: 08/26/2021  Expected End Date: 08/26/2022  Priority: High  Note:   Current Barriers:  Knowledge Deficits related to plan of care for management of CAD, HTN, and HLD  Chronic Disease Management support and education needs related to CAD, HTN, and HLD  RNCM Clinical Goal(s):  Patient will verbalize  basic understanding of CAD, HTN, and HLD disease process and self health management plan as evidenced by keeping appointments, following the plan of care and working with the pcp and CCM team to optimize plan of care for health and well being  demonstrate understanding of rationale for each prescribed medication as evidenced by compliance and calling for refills before running out of medications     attend all scheduled medical appointments: 12-01-2021 at 1120 am as evidenced by keeping appointments and calling the office for needed scheduled changes         demonstrate improved and ongoing health management independence as evidenced by stable conditions, stable VS and not acute exacerbations of conditions         demonstrate a decrease in CAD, HTN, and HLD exacerbations  as evidenced by managed chronic conditions and effective management of chronic conditions  demonstrate ongoing self health care management ability effectively manage chronic conditions  as evidenced by   working with the CCM team through collaboration with Consulting civil engineer, provider, and care team.   Interventions: 1:1 collaboration with primary care provider regarding development and update of comprehensive plan of care as evidenced by provider attestation and co-signature Inter-disciplinary care team collaboration (see longitudinal plan of care) Evaluation of current treatment plan related to  self management and patient's adherence to plan as established by provider   CAD  (Status: Goal on Track (progressing): YES.) Long Term Goal  BP Readings from Last 3 Encounters:  10/27/21 136/72  09/30/21 (!) 158/82  09/16/21 (!) 150/80    Lab Results  Component Value Date   CHOL 97 (L) 05/24/2021   HDL 34 (L) 05/24/2021   [place] 46 05/24/2021   TRIG 86 05/24/2021   CHOLHDL 2.9 05/24/2021    Assessed understanding of CAD diagnosis Medications reviewed including medications utilized in CAD treatment plan. 10-28-2021: The patient is  compliant with medications and treatment plan of CAD Provided education on importance of blood pressure control in management of CAD; Provided education on Importance of limiting foods high in cholesterol. 10-28-2021: Review and education provided Counseled on importance of regular laboratory monitoring as prescribed; Counseled on the importance of exercise goals with target of 150 minutes per week Reviewed Importance of taking all medications as prescribed Reviewed Importance of attending all scheduled provider appointments. 09-19-2021: Saw pcp last week and had COVID and Flu testing. The patient sees the pcp again in 2 weeks. Next appointment with the pcp is 12-01-2021 at 1120 am Advised to report any changes in symptoms or exercise tolerance Screening for signs and symptoms of depression related to chronic disease state;  Assessed social determinant of health barriers;   Hyperlipidemia:  (Status: Goal on Track (progressing): YES.) Long Term Goal  Lab Results  Component Value Date   CHOL 97 (L) 05/24/2021   HDL 34 (L) 05/24/2021   LDLCALC 46 05/24/2021   TRIG 86 05/24/2021   CHOLHDL 2.9 05/24/2021     Medication review performed; medication list updated in electronic medical record. 10-28-2021: The patient is compliant with Crestor 40 mg QD Provider  established cholesterol goals reviewed; Counseled on importance of regular laboratory monitoring as prescribed. 10-28-2021: Education and review of regular lab testing; Provided HLD educational materials; Reviewed role and benefits of statin for ASCVD risk reduction; Discussed strategies to manage statin-induced myalgias; Reviewed importance of limiting foods high in cholesterol; Reviewed exercise goals and target of 150 minutes per week;  Hypertension: (Status: Goal on Track (progressing): YES.) Last practice recorded BP readings:  BP Readings from Last 3 Encounters:  10/27/21 136/72  09/30/21 (!) 158/82  09/16/21 (!) 150/80  Most recent  eGFR/CrCl:  Lab Results  Component Value Date   EGFR 52 (L) 10/27/2021    No components found for: CRCL  Evaluation of current treatment plan related to hypertension self management and patient's adherence to plan as established by provider. 09-19-2021: The patient states that his blood pressures were likely up because he was not feeling the best on Friday. He was calling today to see about test results and let the RNCM know he was feeling better. Will continue to monitor for changes and new needs.   10-28-2021: The patients blood pressures are stabilizing out. He has seen the pcp several times recently. He is a little anxious right now as his elderly mother, who is 27 is likely transitioning. He and his sisters are with her around the clock. The patient states he is doing the best that he can right now. Empathetic listening and support given. Will continue to monitor for changes or new needs.  Provided education to patient re: stroke prevention, s/s of heart attack and stroke; Reviewed prescribed diet heart healthy diet. 10-28-2021: Review of taking care of self during this time when the patients mother is not doing well and also due to the patients chronic conditions.  Reviewed medications with patient and discussed importance of compliance. 10-28-2021: Review and the patient is compliant with medications  Discussed plans with patient for ongoing care management follow up and provided patient with direct contact information for care management team; Advised patient, providing education and rationale, to monitor blood pressure daily and record, calling PCP for findings outside established parameters;  Advised patient to discuss blood pressure trends with provider; Provided education on prescribed diet heart healthy ;  Discussed complications of poorly controlled blood pressure such as heart disease, stroke, circulatory complications, vision complications, kidney impairment, sexual dysfunction;    Acute  cough and respiratory sx and sx   (Status: Goal Met.) Short Term Goal  Evaluation of current treatment plan related to  acute cough and respiratory sx and sx ,  self-management and patient's adherence to plan as established by provider. 09-19-2021: Seen in the office on 09-16-2021. The patient placed an incoming call to the [facility] inquiring about his COVID testing from 09-16-2021. He states he is feeling better but had not received information from the office on the results of his COVID test. Review of results given to the patient. COVID testing from the chart is negative. In-basket message sent to pcp and clinical staff letting them know the patient had been given the results and that he is feeling better. The patient states he will follow up with the pcp in 2 weeks.  Discussed plans with patient for ongoing care management follow up and provided patient with direct contact information for care management team Advised patient to call the office for new sx and sx of infection, questions, or concerns; Provided education to patient re: practicing good hygiene, wearing a mask when out in public, and following the recommendations  of the provider.  The patient states his wife has been sick also and she had to go to her pcp and get medications as well. Education on the many respiratory and viral illnesses going on currently in the area. The patient will call for changes or needs. ; Discussed plans with patient for ongoing care management follow up and provided patient with direct contact information for care management team;   Patient Goals/Self-Care Activities: Take medications as prescribed   Attend all scheduled provider appointments Call pharmacy for medication refills 3-7 days in advance of running out of medications Attend church or other social activities Perform all self care activities independently  Perform IADL's (shopping, preparing meals, housekeeping, managing finances) independently Call provider  office for new concerns or questions  Work with the social worker to address care coordination needs and will continue to work with the clinical team to address health care and disease management related needs call the Suicide and Crisis Lifeline: 988 call the [place] National Suicide Prevention Lifeline: 986-335-4800 or TTY: (832)586-8285 TTY 614-581-3487) to talk to a trained counselor call 1-800-273-TALK (toll free, 24 hour hotline) if experiencing a Mental Health or [place]  check blood pressure weekly choose a place to take my blood pressure (home, clinic or office, retail store) write blood pressure results in a log or diary learn about high blood pressure keep a blood pressure log take blood pressure log to all doctor appointments call doctor for signs and symptoms of high blood pressure develop an action plan for high blood pressure keep all doctor appointments take medications for blood pressure exactly as prescribed report new symptoms to your doctor eat more whole grains, fruits and vegetables, lean meats and healthy fats - call for medicine refill 2 or 3 days before it runs out - take all medications exactly as prescribed - call doctor with any symptoms you believe are related to your medicine - call doctor when you experience any new symptoms - go to all doctor appointments as scheduled - adhere to prescribed diet: heart healthy       Plan:Telephone follow up appointment with care management team member scheduled for:  12-09-2021 at 230 pm  [place], MSN, [facility] Mobile: [phone]

## 2021-10-28 NOTE — Patient Instructions (Signed)
Visit Information  Thank you for taking time to visit with me today. Please don't hesitate to contact me if I can be of assistance to you before our next scheduled telephone appointment.  Following are the goals we discussed today:  RNCM Clinical Goal(s):  Patient will verbalize basic understanding of CAD, HTN, and HLD disease process and self health management plan as evidenced by keeping appointments, following the plan of care and working with the pcp and CCM team to optimize plan of care for health and well being  demonstrate understanding of rationale for each prescribed medication as evidenced by compliance and calling for refills before running out of medications     attend all scheduled medical appointments: 12-01-2021 at 1120 am as evidenced by keeping appointments and calling the office for needed scheduled changes         demonstrate improved and ongoing health management independence as evidenced by stable conditions, stable VS and not acute exacerbations of conditions         demonstrate a decrease in CAD, HTN, and HLD exacerbations  as evidenced by managed chronic conditions and effective management of chronic conditions  demonstrate ongoing self health care management ability effectively manage chronic conditions  as evidenced by   working with the CCM team through collaboration with Consulting civil engineer, provider, and care team.    Interventions: 1:1 collaboration with primary care provider regarding development and update of comprehensive plan of care as evidenced by provider attestation and co-signature Inter-disciplinary care team collaboration (see longitudinal plan of care) Evaluation of current treatment plan related to  self management and patient's adherence to plan as established by provider     CAD  (Status: Goal on Track (progressing): YES.) Long Term Goal     BP Readings from Last 3 Encounters:  10/27/21 136/72  09/30/21 (!) 158/82  09/16/21 (!) 150/80         Lab  Results  Component Value Date    CHOL 97 (L) 05/24/2021    HDL 34 (L) 05/24/2021    [place] 46 05/24/2021    TRIG 86 05/24/2021    CHOLHDL 2.9 05/24/2021    Assessed understanding of CAD diagnosis Medications reviewed including medications utilized in CAD treatment plan. 10-28-2021: The patient is compliant with medications and treatment plan of CAD Provided education on importance of blood pressure control in management of CAD; Provided education on Importance of limiting foods high in cholesterol. 10-28-2021: Review and education provided Counseled on importance of regular laboratory monitoring as prescribed; Counseled on the importance of exercise goals with target of 150 minutes per week Reviewed Importance of taking all medications as prescribed Reviewed Importance of attending all scheduled provider appointments. 09-19-2021: Saw pcp last week and had COVID and Flu testing. The patient sees the pcp again in 2 weeks. Next appointment with the pcp is 12-01-2021 at 1120 am Advised to report any changes in symptoms or exercise tolerance Screening for signs and symptoms of depression related to chronic disease state;  Assessed social determinant of health barriers;    Hyperlipidemia:  (Status: Goal on Track (progressing): YES.) Long Term Goal       Lab Results  Component Value Date    CHOL 97 (L) 05/24/2021    HDL 34 (L) 05/24/2021    LDLCALC 46 05/24/2021    TRIG 86 05/24/2021    CHOLHDL 2.9 05/24/2021      Medication review performed; medication list updated in electronic medical record. 10-28-2021: The patient is compliant with  Crestor 40 mg QD Provider established cholesterol goals reviewed; Counseled on importance of regular laboratory monitoring as prescribed. 10-28-2021: Education and review of regular lab testing; Provided HLD educational materials; Reviewed role and benefits of statin for ASCVD risk reduction; Discussed strategies to manage statin-induced myalgias; Reviewed  importance of limiting foods high in cholesterol; Reviewed exercise goals and target of 150 minutes per week;   Hypertension: (Status: Goal on Track (progressing): YES.) Last practice recorded BP readings:     BP Readings from Last 3 Encounters:  10/27/21 136/72  09/30/21 (!) 158/82  09/16/21 (!) 150/80  Most recent eGFR/CrCl:       Lab Results  Component Value Date    EGFR 52 (L) 10/27/2021    No components found for: CRCL   Evaluation of current treatment plan related to hypertension self management and patient's adherence to plan as established by provider. 09-19-2021: The patient states that his blood pressures were likely up because he was not feeling the best on Friday. He was calling today to see about test results and let the RNCM know he was feeling better. Will continue to monitor for changes and new needs.   10-28-2021: The patients blood pressures are stabilizing out. He has seen the pcp several times recently. He is a little anxious right now as his elderly mother, who is 32 is likely transitioning. He and his sisters are with her around the clock. The patient states he is doing the best that he can right now. Empathetic listening and support given. Will continue to monitor for changes or new needs.  Provided education to patient re: stroke prevention, s/s of heart attack and stroke; Reviewed prescribed diet heart healthy diet. 10-28-2021: Review of taking care of self during this time when the patients mother is not doing well and also due to the patients chronic conditions.  Reviewed medications with patient and discussed importance of compliance. 10-28-2021: Review and the patient is compliant with medications  Discussed plans with patient for ongoing care management follow up and provided patient with direct contact information for care management team; Advised patient, providing education and rationale, to monitor blood pressure daily and record, calling PCP for findings outside  established parameters;  Advised patient to discuss blood pressure trends with provider; Provided education on prescribed diet heart healthy ;  Discussed complications of poorly controlled blood pressure such as heart disease, stroke, circulatory complications, vision complications, kidney impairment, sexual dysfunction;      Acute cough and respiratory sx and sx   (Status: Goal Met.) Short Term Goal  Evaluation of current treatment plan related to  acute cough and respiratory sx and sx ,  self-management and patient's adherence to plan as established by provider. 09-19-2021: Seen in the office on 09-16-2021. The patient placed an incoming call to the [facility] inquiring about his COVID testing from 09-16-2021. He states he is feeling better but had not received information from the office on the results of his COVID test. Review of results given to the patient. COVID testing from the chart is negative. In-basket message sent to pcp and clinical staff letting them know the patient had been given the results and that he is feeling better. The patient states he will follow up with the pcp in 2 weeks.  Discussed plans with patient for ongoing care management follow up and provided patient with direct contact information for care management team Advised patient to call the office for new sx and sx of infection, questions, or concerns; Provided  education to patient re: practicing good hygiene, wearing a mask when out in public, and following the recommendations of the provider.  The patient states his wife has been sick also and she had to go to her pcp and get medications as well. Education on the many respiratory and viral illnesses going on currently in the area. The patient will call for changes or needs. ; Discussed plans with patient for ongoing care management follow up and provided patient with direct contact information for care management team;    Patient Goals/Self-Care Activities: Take medications as  prescribed   Attend all scheduled provider appointments Call pharmacy for medication refills 3-7 days in advance of running out of medications Attend church or other social activities Perform all self care activities independently  Perform IADL's (shopping, preparing meals, housekeeping, managing finances) independently Call provider office for new concerns or questions  Work with the social worker to address care coordination needs and will continue to work with the clinical team to address health care and disease management related needs call the Suicide and Crisis Lifeline: 988 call the [place] National Suicide Prevention Lifeline: 731-149-8992 or TTY: (218)692-3213 TTY 8162737744) to talk to a trained counselor call 1-800-273-TALK (toll free, 24 hour hotline) if experiencing a Mental Health or [place]  check blood pressure weekly choose a place to take my blood pressure (home, clinic or office, retail store) write blood pressure results in a log or diary learn about high blood pressure keep a blood pressure log take blood pressure log to all doctor appointments call doctor for signs and symptoms of high blood pressure develop an action plan for high blood pressure keep all doctor appointments take medications for blood pressure exactly as prescribed report new symptoms to your doctor eat more whole grains, fruits and vegetables, lean meats and healthy fats - call for medicine refill 2 or 3 days before it runs out - take all medications exactly as prescribed - call doctor with any symptoms you believe are related to your medicine - call doctor when you experience any new symptoms - go to all doctor appointments as scheduled - adhere to prescribed diet: heart healthy    Our next appointment is by telephone on 12-09-2021 at 230 pm  Please call the care guide team at [phone] if you need to cancel or reschedule your appointment.   If you are experiencing a  Mental Health or [place] or need someone to talk to, please call the Suicide and Crisis Lifeline: 988 call the [place] National Suicide Prevention Lifeline: (414)033-9250 or TTY: 917-491-3122 TTY 9303096883) to talk to a trained counselor call 1-800-273-TALK (toll free, 24 hour hotline)   The patient verbalized understanding of instructions, educational materials, and care plan provided today and declined offer to receive copy of patient instructions, educational materials, and care plan.   Noreene Larsson RN, MSN, [facility] Mobile: [phone]

## 2021-11-08 ENCOUNTER — Ambulatory Visit: Payer: Self-pay | Admitting: *Deleted

## 2021-11-08 ENCOUNTER — Other Ambulatory Visit: Payer: Self-pay

## 2021-11-08 ENCOUNTER — Ambulatory Visit (INDEPENDENT_AMBULATORY_CARE_PROVIDER_SITE_OTHER): Payer: Medicare Other | Admitting: Unknown Physician Specialty

## 2021-11-08 ENCOUNTER — Encounter: Payer: Self-pay | Admitting: Unknown Physician Specialty

## 2021-11-08 VITALS — BP 138/68 | HR 67 | Temp 98.0°F | Wt 229.8 lb

## 2021-11-08 DIAGNOSIS — I2583 Coronary atherosclerosis due to lipid rich plaque: Secondary | ICD-10-CM

## 2021-11-08 DIAGNOSIS — M25562 Pain in left knee: Secondary | ICD-10-CM | POA: Diagnosis not present

## 2021-11-08 DIAGNOSIS — I2510 Atherosclerotic heart disease of native coronary artery without angina pectoris: Secondary | ICD-10-CM

## 2021-11-08 DIAGNOSIS — E785 Hyperlipidemia, unspecified: Secondary | ICD-10-CM

## 2021-11-08 DIAGNOSIS — I10 Essential (primary) hypertension: Secondary | ICD-10-CM

## 2021-11-08 MED ORDER — METHYLPREDNISOLONE 4 MG PO TBPK: ORAL_TABLET | ORAL | 0 refills | Status: DC

## 2021-11-08 NOTE — Progress Notes (Signed)
BP 138/68    Pulse 67    Temp 98 F (36.7 C) (Oral)    Wt 229 lb 12.8 oz (104.2 kg)    SpO2 97%    BMI 35.98 kg/m    Subjective:    Patient ID: Ricky Melton, male    DOB: 11-28-1950, 71 y.o.   MRN: [id number]  HPI: Ricky Melton is a 71 y.o. male  Chief Complaint  Patient presents with   Knee Pain    Pt states he has been having left knee pain since last week. States he does not remember injuring his knee, just started hurting after yard work. States the pain is only when putting weight on the knee    Knee Pain  Incident onset: 1 week. Incident location: yard work. There was no injury mechanism. The pain is present in the left knee. The quality of the pain is described as aching. The pain is moderate. Associated symptoms comments: Worse with weight bearing. He reports no foreign bodies present. The symptoms are aggravated by weight bearing. Treatments tried: Bioflex. The treatment provided no relief.   Relevant past medical, surgical, family and social history reviewed and updated as indicated. Interim medical history since our last visit reviewed. Allergies and medications reviewed and updated.  Review of Systems  Constitutional: Negative.   Respiratory: Negative.    Cardiovascular: Negative.   Neurological: Negative.    Per HPI unless specifically indicated above     Objective:    BP 138/68    Pulse 67    Temp 98 F (36.7 C) (Oral)    Wt 229 lb 12.8 oz (104.2 kg)    SpO2 97%    BMI 35.98 kg/m   Wt Readings from Last 3 Encounters:  11/08/21 229 lb 12.8 oz (104.2 kg)  10/27/21 222 lb (100.7 kg)  10/13/21 225 lb 9.6 oz (102.3 kg)    Physical Exam Constitutional:      General: He is not in acute distress.    Appearance: Normal appearance. He is well-developed.  HENT:     Head: Normocephalic and atraumatic.  Eyes:     General: Lids are normal. No scleral icterus.       Right eye: No discharge.        Left eye: No discharge.     Conjunctiva/sclera: Conjunctivae  normal.  Neck:     Vascular: No carotid bruit or JVD.  Cardiovascular:     Rate and Rhythm: Normal rate and regular rhythm.     Heart sounds: Normal heart sounds.  Pulmonary:     Effort: Pulmonary effort is normal. No respiratory distress.     Breath sounds: Normal breath sounds.  Abdominal:     Palpations: There is no hepatomegaly or splenomegaly.  Musculoskeletal:        General: Normal range of motion.     Cervical back: Normal range of motion and neck supple.     Left knee: No swelling, erythema or crepitus. No tenderness.  Skin:    General: Skin is warm and dry.     Coloration: Skin is not pale.     Findings: No rash.  Neurological:     Mental Status: He is alert and oriented to person, place, and time.  Psychiatric:        Behavior: Behavior normal.        Thought Content: Thought content normal.        Judgment: Judgment normal.    Results for orders  or performed in visit on 10/27/21  °Basic metabolic panel  °Result Value Ref Range  ° Glucose 101 (H) 70 - 99 mg/dL  ° BUN 28 (H) 8 - 27 mg/dL  ° Creatinine, Ser 1.44 (H) 0.76 - 1.27 mg/dL  ° eGFR 52 (L) >59 mL/min/1.73  ° BUN/Creatinine Ratio 19 10 - 24  ° Sodium 144 134 - 144 mmol/L  ° Potassium 5.2 3.5 - 5.2 mmol/L  ° Chloride 102 96 - 106 mmol/L  ° CO2 22 20 - 29 mmol/L  ° Calcium 10.1 8.6 - 10.2 mg/dL  ° °   °Assessment & Plan:  ° °Problem List Items Addressed This Visit   °None °Visit Diagnoses   ° ° Acute pain of left knee    -  Primary  ° Ligament or tendon pain. Encouraged extra strength Tylenol, 3-4 times/day.  Ice.  Rx for medrol dose pack.  NSAIDs not recommended due to CKD, htn  ° °  °  °Will not do an x-ray as I suspect it will not change treatment plan ° °Follow up plan: °Return if symptoms worsen or fail to improve. ° ° ° ° ° °

## 2021-11-08 NOTE — Telephone Encounter (Signed)
Patient being seen today @ 3pm

## 2021-11-08 NOTE — Telephone Encounter (Signed)
°  Chief Complaint: Left knee "Aches" Symptoms: "Nagging ache left knee only with walking." Noted after working in yard 9 days ago. Frequency: 9 days Pertinent Negatives: Patient denies redness, swelling, fever, calf pain. Tylenol and "Pain cream" helps. Disposition: [] ED /[] Urgent Care (no appt availability in office) / [x] Appointment(In office/virtual)/ []  [facility]/ [] Home Care/ [] Refused Recommended Disposition /[] [facility]/ []  Follow-up with PCP Additional Notes: Pt has appt scheduled for 3/2 23. Requesting earlier appt. "Want to come by today if I can, just look at it." Assured pt NT would route to practice for PCPs review and final disposition. Home care advise given, verbalizes understanding.      Reason for Disposition  [1] MILD pain (e.g., does not interfere with normal activities) AND [2] present > 7 days  Answer Assessment - Initial Assessment Questions 1. LOCATION and RADIATION: "Where is the pain located?"      Left knee 2. QUALITY: "What does the pain feel like?"  (e.g., sharp, dull, aching, burning)     "Like a toothache" Ache. Only with walking 3. SEVERITY: "How bad is the pain?" "What does it keep you from doing?"   (Scale 1-10; or mild, moderate, severe)   -  MILD (1-3): doesn't interfere with normal activities    -  MODERATE (4-7): interferes with normal activities (e.g., work or school) or awakens from sleep, limping    -  SEVERE (8-10): excruciating pain, unable to do any normal activities, unable to walk     "Nagging ache" 4. ONSET: "When did the pain start?" "Does it come and go, or is it there all the time?"    9 days ago 5. RECURRENT: "Have you had this pain before?" If Yes, ask: "When, and what happened then?"     no 6. SETTING: "Has there been any recent work, exercise or other activity that involved that part of the body?"      Working in yard, felt ache 7. AGGRAVATING FACTORS: "What makes the knee pain worse?" (e.g., walking,  climbing stairs, running)     Walking 8. ASSOCIATED SYMPTOMS: "Is there any swelling or redness of the knee?"     no 9. OTHER SYMPTOMS: "Do you have any other symptoms?" (e.g., chest pain, difficulty breathing, fever, calf pain)     no  Protocols used: Knee Pain-A-AH

## 2021-11-10 ENCOUNTER — Ambulatory Visit: Payer: Medicare Other | Admitting: Family Medicine

## 2021-11-10 ENCOUNTER — Ambulatory Visit: Payer: Medicare Other | Admitting: Internal Medicine

## 2021-11-24 ENCOUNTER — Ambulatory Visit: Payer: Medicare Other | Admitting: Internal Medicine

## 2021-12-01 ENCOUNTER — Encounter: Payer: Self-pay | Admitting: Family Medicine

## 2021-12-01 ENCOUNTER — Other Ambulatory Visit: Payer: Self-pay

## 2021-12-01 ENCOUNTER — Ambulatory Visit (INDEPENDENT_AMBULATORY_CARE_PROVIDER_SITE_OTHER): Payer: Medicare Other | Admitting: Family Medicine

## 2021-12-01 VITALS — BP 136/72 | HR 61 | Temp 98.2°F | Wt 227.0 lb

## 2021-12-01 DIAGNOSIS — M25562 Pain in left knee: Secondary | ICD-10-CM

## 2021-12-01 DIAGNOSIS — F419 Anxiety disorder, unspecified: Secondary | ICD-10-CM | POA: Diagnosis not present

## 2021-12-01 MED ORDER — DICLOFENAC SODIUM 1 % EX GEL: 4.0000 g | Freq: Four times a day (QID) | CUTANEOUS | 3 refills | Status: DC

## 2021-12-01 NOTE — Progress Notes (Signed)
? ?BP 136/72   Pulse 61   Temp 98.2 ?F (36.8 ?C)   Wt 227 lb (103 kg)   SpO2 98%   BMI 35.54 kg/m?   ? ?Subjective:  ? ? Patient ID: Ricky Melton, male    DOB: 01/13/1951, 71 y.o.   MRN: [id number] ? ?HPI: ?Ricky Melton is a 71 y.o. male ? ?Chief Complaint  ?Patient presents with  ? Anxiety  ? ?Mom passed 2 days after his last visit. Took some lorazepam, which helped, but doing better now.  ? ?KNEE PAIN ?Duration:  2-3 weeks ?Involved knee: left ?Mechanism of injury: unknown ?Location:medial ?Onset: sudden ?Severity: moderate  ?Quality:  aching and sore ?Frequency:  with walking on it ?Radiation: no ?Aggravating factors: weight bearing, walking, and stairs  ?Alleviating factors: medicine  ?Status: better ?Treatments attempted: medrol dosepak  ?Relief with NSAIDs?:  No NSAIDs Taken ?Weakness with weight bearing or walking: yes ?Sensation of giving way: yes ?Locking: no ?Popping: no ?Bruising: no ?Swelling: no ?Redness: no ?Paresthesias/decreased sensation: no ?Fevers: no ? ? ?Relevant past medical, surgical, family and social history reviewed and updated as indicated. Interim medical history since our last visit reviewed. ?Allergies and medications reviewed and updated. ? ?Review of Systems  ?Constitutional: Negative.   ?Respiratory: Negative.    ?Cardiovascular: Negative.   ?Gastrointestinal: Negative.   ?Musculoskeletal:  Positive for arthralgias. Negative for back pain, gait problem, joint swelling, myalgias, neck pain and neck stiffness.  ?Skin: Negative.   ?Psychiatric/Behavioral: Negative.    ? ?Per HPI unless specifically indicated above ? ?   ?Objective:  ?  ?BP 136/72   Pulse 61   Temp 98.2 ?F (36.8 ?C)   Wt 227 lb (103 kg)   SpO2 98%   BMI 35.54 kg/m?   ?Wt Readings from Last 3 Encounters:  ?12/01/21 227 lb (103 kg)  ?11/08/21 229 lb 12.8 oz (104.2 kg)  ?10/27/21 222 lb (100.7 kg)  ?  ?Physical Exam ?Vitals and nursing note reviewed.  ?Constitutional:   ?   General: He is not in acute  distress. ?   Appearance: Normal appearance. He is not ill-appearing, toxic-appearing or diaphoretic.  ?HENT:  ?   Head: Normocephalic and atraumatic.  ?   Right Ear: External ear normal.  ?   Left Ear: External ear normal.  ?   Nose: Nose normal.  ?   Mouth/Throat:  ?   Mouth: Mucous membranes are moist.  ?   Pharynx: Oropharynx is clear.  ?Eyes:  ?   General: No scleral icterus.    ?   Right eye: No discharge.     ?   Left eye: No discharge.  ?   Extraocular Movements: Extraocular movements intact.  ?   Conjunctiva/sclera: Conjunctivae normal.  ?   Pupils: Pupils are equal, round, and reactive to light.  ?Cardiovascular:  ?   Rate and Rhythm: Normal rate and regular rhythm.  ?   Pulses: Normal pulses.  ?   Heart sounds: Normal heart sounds. No murmur heard. ?  No friction rub. No gallop.  ?Pulmonary:  ?   Effort: Pulmonary effort is normal. No respiratory distress.  ?   Breath sounds: Normal breath sounds. No stridor. No wheezing, rhonchi or rales.  ?Chest:  ?   Chest wall: No tenderness.  ?Musculoskeletal:     ?   General: Swelling and tenderness (at joint line of L knee) present. Normal range of motion.  ?   Cervical back: Normal range of  motion and neck supple.  ?Skin: ?   General: Skin is warm and dry.  ?   Capillary Refill: Capillary refill takes less than 2 seconds.  ?   Coloration: Skin is not jaundiced or pale.  ?   Findings: No bruising, erythema, lesion or rash.  ?Neurological:  ?   General: No focal deficit present.  ?   Mental Status: He is alert and oriented to person, place, and time. Mental status is at baseline.  ?Psychiatric:     ?   Mood and Affect: Mood normal.     ?   Behavior: Behavior normal.     ?   Thought Content: Thought content normal.     ?   Judgment: Judgment normal.  ? ? ?Results for orders placed or performed in visit on 10/27/21  ?Basic metabolic panel  ?Result Value Ref Range  ? Glucose 101 (H) 70 - 99 mg/dL  ? BUN 28 (H) 8 - 27 mg/dL  ? Creatinine, Ser 1.44 (H) 0.76 - 1.27 mg/dL   ? eGFR 52 (L) >59 mL/min/1.73  ? BUN/Creatinine Ratio 19 10 - 24  ? Sodium 144 134 - 144 mmol/L  ? Potassium 5.2 3.5 - 5.2 mmol/L  ? Chloride 102 96 - 106 mmol/L  ? CO2 22 20 - 29 mmol/L  ? Calcium 10.1 8.6 - 10.2 mg/dL  ? ?   ?Assessment & Plan:  ? ?Problem List Items Addressed This Visit   ?None ?Visit Diagnoses   ? ? Acute pain of left knee    -  Primary  ? Will treat with voltaren and obtain x-ray. Return for recheck in 2 weeks, if not improving, will consider shot.   ? Relevant Orders  ? DG Knee Complete 4 Views Left  ? Acute anxiety      ? Improved. No longer on lorazepam. Call with any concerns.   ? ?  ?  ? ?Follow up plan: ?Return in about 2 weeks (around 12/15/2021). ? ? ? ? ? ?

## 2021-12-05 ENCOUNTER — Ambulatory Visit: Payer: Medicare Other

## 2021-12-08 ENCOUNTER — Ambulatory Visit
Admission: RE | Admit: 2021-12-08 | Discharge: 2021-12-08 | Disposition: A | Discharge status: Home / Self Care | Payer: Medicare Other | Source: Ambulatory Visit | Attending: Family Medicine | Admitting: Family Medicine

## 2021-12-08 ENCOUNTER — Ambulatory Visit
Admission: RE | Admit: 2021-12-08 | Discharge: 2021-12-08 | Disposition: A | Discharge status: Home / Self Care | Payer: Medicare Other | Attending: Family Medicine | Admitting: Family Medicine

## 2021-12-08 DIAGNOSIS — M25562 Pain in left knee: Secondary | ICD-10-CM

## 2021-12-08 DIAGNOSIS — I340 Nonrheumatic mitral (valve) insufficiency: Secondary | ICD-10-CM | POA: Diagnosis not present

## 2021-12-08 DIAGNOSIS — I350 Nonrheumatic aortic (valve) stenosis: Secondary | ICD-10-CM | POA: Diagnosis not present

## 2021-12-08 DIAGNOSIS — I10 Essential (primary) hypertension: Secondary | ICD-10-CM | POA: Diagnosis not present

## 2021-12-08 DIAGNOSIS — R55 Syncope and collapse: Secondary | ICD-10-CM | POA: Diagnosis not present

## 2021-12-08 DIAGNOSIS — I2510 Atherosclerotic heart disease of native coronary artery without angina pectoris: Secondary | ICD-10-CM | POA: Diagnosis not present

## 2021-12-08 DIAGNOSIS — E782 Mixed hyperlipidemia: Secondary | ICD-10-CM | POA: Diagnosis not present

## 2021-12-09 ENCOUNTER — Telehealth: Payer: Self-pay

## 2021-12-09 ENCOUNTER — Telehealth: Payer: Medicare Other

## 2021-12-09 ENCOUNTER — Ambulatory Visit (INDEPENDENT_AMBULATORY_CARE_PROVIDER_SITE_OTHER): Payer: Medicare Other

## 2021-12-09 DIAGNOSIS — M25562 Pain in left knee: Secondary | ICD-10-CM

## 2021-12-09 DIAGNOSIS — I10 Essential (primary) hypertension: Secondary | ICD-10-CM

## 2021-12-09 DIAGNOSIS — E785 Hyperlipidemia, unspecified: Secondary | ICD-10-CM

## 2021-12-09 DIAGNOSIS — I2510 Atherosclerotic heart disease of native coronary artery without angina pectoris: Secondary | ICD-10-CM

## 2021-12-09 DIAGNOSIS — I2583 Coronary atherosclerosis due to lipid rich plaque: Secondary | ICD-10-CM

## 2021-12-09 NOTE — Patient Instructions (Signed)
Visit Information ? ?Thank you for taking time to visit with me today. Please don't hesitate to contact me if I can be of assistance to you before our next scheduled telephone appointment. ? ?Following are the goals we discussed today:  ?RNCM Clinical Goal(s):  ?Patient will verbalize basic understanding of CAD, HTN, and HLD disease process and self health management plan as evidenced by keeping appointments, following the plan of care and working with the pcp and CCM team to optimize plan of care for health and well being  ?demonstrate understanding of rationale for each prescribed medication as evidenced by compliance and calling for refills before running out of medications     ?attend all scheduled medical appointments: 12-01-2021 at 1120 am as evidenced by keeping appointments and calling the office for needed scheduled changes         ?demonstrate improved and ongoing health management independence as evidenced by stable conditions, stable VS and not acute exacerbations of conditions         ?demonstrate a decrease in CAD, HTN, and HLD exacerbations  as evidenced by managed chronic conditions and effective management of chronic conditions  ?demonstrate ongoing self health care management ability effectively manage chronic conditions  as evidenced by   working with the CCM team through collaboration with Consulting civil engineer, provider, and care team.  ?  ?Interventions: ?1:1 collaboration with primary care provider regarding development and update of comprehensive plan of care as evidenced by provider attestation and co-signature ?Inter-disciplinary care team collaboration (see longitudinal plan of care) ?Evaluation of current treatment plan related to  self management and patient's adherence to plan as established by provider ?  ?  ?CAD  (Status: Goal on Track (progressing): YES.) Long Term Goal  ?   ?BP Readings from Last 3 Encounters:  ?12/01/21 136/72  ?11/08/21 138/68  ?10/27/21 136/72  ?  ?     ?Lab Results   ?Component Value Date  ?  CHOL 97 (L) 05/24/2021  ?  HDL 34 (L) 05/24/2021  ?  LDLCALC 46 05/24/2021  ?  TRIG 86 05/24/2021  ?  CHOLHDL 2.9 05/24/2021  ?  ?Assessed understanding of CAD diagnosis. 12-09-2021: The patient states that he understands his chronic conditions and is compliant with the plan of care.  ?Medications reviewed including medications utilized in CAD treatment plan. 12-09-2021: The patient is compliant with medications and treatment plan of CAD ?Provided education on importance of blood pressure control in management of CAD; ?Provided education on Importance of limiting foods high in cholesterol. 12-09-2021: Review and education provided ?Counseled on importance of regular laboratory monitoring as prescribed. 12-09-2021: The patient has regular lab work; ?Counseled on the importance of exercise goals with target of 150 minutes per week ?Reviewed Importance of taking all medications as prescribed. 12-09-2021: The patient endorses compliance with medications ?Reviewed Importance of attending all scheduled provider appointments. 12-22-2021 with the pcp ?Advised to report any changes in symptoms or exercise tolerance. 12-09-2021: The patient played golf today and is active. Denies any acute findings today.  ?Screening for signs and symptoms of depression related to chronic disease state;  ?Assessed social determinant of health barriers;  ?  ?Hyperlipidemia:  (Status: Goal on Track (progressing): YES.) Long Term Goal  ?     ?Lab Results  ?Component Value Date  ?  CHOL 97 (L) 05/24/2021  ?  HDL 34 (L) 05/24/2021  ?  LDLCALC 46 05/24/2021  ?  TRIG 86 05/24/2021  ?  CHOLHDL 2.9 05/24/2021  ?  ?  ?  Medication review performed; medication list updated in electronic medical record. 12-09-2021: The patient is compliant with Crestor 40 mg QD ?Provider established cholesterol goals reviewed; ?Counseled on importance of regular laboratory monitoring as prescribed. 12-09-2021: Education and review of regular lab  testing; ?Provided HLD educational materials; ?Reviewed role and benefits of statin for ASCVD risk reduction; ?Discussed strategies to manage statin-induced myalgias; ?Reviewed importance of limiting foods high in cholesterol. 12-09-2021: The patient is mindful of dietary restrictions and follows the plan of care; ?Reviewed exercise goals and target of 150 minutes per week; ?  ?Hypertension: (Status: Goal on Track (progressing): YES.) ?Last practice recorded BP readings:  ?   ?BP Readings from Last 3 Encounters:  ?12/01/21 136/72  ?11/08/21 138/68  ?10/27/21 136/72  ?Most recent eGFR/CrCl:  ?     ?Lab Results  ?Component Value Date  ?  EGFR 52 (L) 10/27/2021  ?  No components found for: CRCL ?  ?Evaluation of current treatment plan related to hypertension self management and patient's adherence to plan as established by provider. 09-19-2021: The patient states that his blood pressures were likely up because he was not feeling the best on Friday. He was calling today to see about test results and let the RNCM know he was feeling better. Will continue to monitor for changes and new needs.   10-28-2021: The patients blood pressures are stabilizing out. He has seen the pcp several times recently. He is a little anxious right now as his elderly mother, who is 21 is likely transitioning. He and his sisters are with her around the clock. The patient states he is doing the best that he can right now. Empathetic listening and support given. Will continue to monitor for changes or new needs. 12-09-2021: The patient states that his blood sugars have been more stable. The patient states he is writing down the values and taking them with him to his provider visits. The patient states that his mother passed away on 11-13-21. Condolences expressed. The patient states he is doing pretty good except for some knee pain.  ?Provided education to patient re: stroke prevention, s/s of heart attack and stroke; ?Reviewed prescribed diet heart  healthy diet. 10-28-2021: Review of taking care of self during this time when the patients mother is not doing well and also due to the patients chronic conditions. 12-09-2021: The patient reflected on the loss of his mother. He is doing well since she passed away. Reflective listening and support given.  ?Reviewed medications with patient and discussed importance of compliance. 12-09-2021: Review and the patient is compliant with medications  ?Discussed plans with patient for ongoing care management follow up and provided patient with direct contact information for care management team; ?Advised patient, providing education and rationale, to monitor blood pressure daily and record, calling PCP for findings outside established parameters;  ?Advised patient to discuss blood pressure trends with provider; ?Provided education on prescribed diet heart healthy ;  ?Discussed complications of poorly controlled blood pressure such as heart disease, stroke, circulatory complications, vision complications, kidney impairment, sexual dysfunction;  ?  ?  ?Acute cough and respiratory sx and sx   (Status: Goal Met.) Short Term Goal  ?Evaluation of current treatment plan related to  acute cough and respiratory sx and sx ,  self-management and patient's adherence to plan as established by provider. 09-19-2021: Seen in the office on 09-16-2021. The patient placed an incoming call to the [facility] inquiring about his COVID testing from 09-16-2021. He states he is feeling  better but had not received information from the office on the results of his COVID test. Review of results given to the patient. COVID testing from the chart is negative. In-basket message sent to pcp and clinical staff letting them know the patient had been given the results and that he is feeling better. The patient states he will follow up with the pcp in 2 weeks.  ?Discussed plans with patient for ongoing care management follow up and provided patient with direct contact  information for care management team ?Advised patient to call the office for new sx and sx of infection, questions, or concerns; ?Provided education to patient re: practicing good hygiene, wearing a mask when out in p

## 2021-12-09 NOTE — Telephone Encounter (Signed)
?  Care Management  ? ?Follow Up Note ? ? ?12/09/2021 ?Name: Ricky Melton MRN: [id number] DOB: 1951-06-08 ? ? ?Referred by: Valerie Roys, DO ?Reason for referral : Chronic Care Management (RNCM: Follow up for Chronic Disease Management and Care Coordination Needs ) ? ? ?The patient returned the call to the [facility] and call was completed. See new encounter for details.  ? ?Follow Up Plan: Telephone follow up appointment with care management team member scheduled for: 02-03-2022 at 230 pm ? ?Noreene Larsson RN, MSN, CCM ?Community Care Coordinator ?[facility] ?[place] ?Mobile: [phone]  ?

## 2021-12-09 NOTE — Chronic Care Management (AMB) (Signed)
?Chronic Care Management  ? ?CCM RN Visit Note ? ?12/09/2021 ?Name: Ricky Melton MRN: [id number] DOB: 01-Nov-1950 ? ?Subjective: ?Ricky Melton is a 71 y.o. year old male who is a primary care patient of Valerie Roys, DO. The care management team was consulted for assistance with disease management and care coordination needs.   ? ?Engaged with patient by telephone for follow up visit in response to provider referral for case management and/or care coordination services.  ? ?Consent to Services:  ?The patient was given information about Chronic Care Management services, agreed to services, and gave verbal consent prior to initiation of services.  Please see initial visit note for detailed documentation.  ? ?Patient agreed to services and verbal consent obtained.  ? ?Assessment: Review of patient past medical history, allergies, medications, health status, including review of consultants reports, laboratory and other test data, was performed as part of comprehensive evaluation and provision of chronic care management services.  ? ?SDOH (Social Determinants of Health) assessments and interventions performed:   ? ?CCM Care Plan ? ?Allergies  ?Allergen Reactions  ? Hydrochlorothiazide Other (See Comments)  ?  dizziness  ? ? ?Outpatient Encounter Medications as of 12/09/2021  ?Medication Sig  ? allopurinol (ZYLOPRIM) 300 MG tablet TAKE 1 TABLET BY MOUTH  DAILY  ? amLODipine (NORVASC) 10 MG tablet Take 1 tablet (10 mg total) by mouth every evening.  ? aspirin 81 MG tablet Take 81 mg by mouth daily.  ? benazepril (LOTENSIN) 20 MG tablet Take 1 tablet (20 mg total) by mouth daily.  ? carvedilol (COREG) 12.5 MG tablet Take 1 tablet (12.5 mg total) by mouth 2 (two) times daily with a meal.  ? clopidogrel (PLAVIX) 75 MG tablet TAKE 1 TABLET BY MOUTH  DAILY  ? diclofenac Sodium (VOLTAREN) 1 % GEL Apply 4 g topically 4 (four) times daily.  ? fluticasone (FLONASE) 50 MCG/ACT nasal spray Place 2 sprays into both nostrils daily.   ? levothyroxine (SYNTHROID) 50 MCG tablet TAKE 1 TABLET BY MOUTH  DAILY  ? meclizine (ANTIVERT) 25 MG tablet Take 1 tablet (25 mg total) by mouth 3 (three) times daily as needed for dizziness.  ? rosuvastatin (CRESTOR) 40 MG tablet TAKE 1 TABLET BY MOUTH  DAILY  ? ?No facility-administered encounter medications on file as of 12/09/2021.  ? ? ?Patient Active Problem List  ? Diagnosis Date Noted  ? Essential hypertension 09/16/2021  ? Prediabetes 07/11/2021  ? Advanced care planning/counseling discussion 10/25/2017  ? Allergy 07/12/2017  ? Morbid obesity ([place]) 10/19/2016  ? BPH (benign prostatic hyperplasia) 10/19/2016  ? 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 CAD (coronary artery disease) 03/23/2015  ? Benign hematuria 03/23/2015  ? Hyperlipidemia 03/23/2015  ? Hypothyroidism 03/23/2015  ? Gout 03/23/2015  ? ? ?Conditions to be addressed/monitored:CAD, HTN, HLD, and Chronic pain  ? ?Care Plan : RNCM: General Plan of Care (Adult) for Chronic Disease Management and Care Coordination Needs  ?Updates made by Vanita Ingles, RN since 12/09/2021 12:00 AM  ?  ? ?Problem: RNCM: Development of Plan of Care for Chronic Disease Management (CAD, HLD, HTN)   ?Priority: High  ?  ? ?Long-Range Goal: RNCM: Development of Plan of Care for Chronic Disease Management (CAD, HLD, HTN)   ?Start Date: 08/26/2021  ?Expected End Date: 08/26/2022  ?Priority: High  ?Note:   ?Current Barriers:  ?Knowledge Deficits related to plan of care for management of CAD, HTN, and HLD  ?Chronic Disease Management support and  education needs related to CAD, HTN, and HLD ? ?RNCM Clinical Goal(s):  ?Patient will verbalize basic understanding of CAD, HTN, and HLD disease process and self health management plan as evidenced by keeping appointments, following the plan of care and working with the pcp and CCM team to optimize plan of care for health and well being  ?demonstrate understanding of rationale for each prescribed medication as evidenced by  compliance and calling for refills before running out of medications     ?attend all scheduled medical appointments: 12-01-2021 at 1120 am as evidenced by keeping appointments and calling the office for needed scheduled changes         ?demonstrate improved and ongoing health management independence as evidenced by stable conditions, stable VS and not acute exacerbations of conditions         ?demonstrate a decrease in CAD, HTN, and HLD exacerbations  as evidenced by managed chronic conditions and effective management of chronic conditions  ?demonstrate ongoing self health care management ability effectively manage chronic conditions  as evidenced by   working with the CCM team through collaboration with Consulting civil engineer, provider, and care team.  ? ?Interventions: ?1:1 collaboration with primary care provider regarding development and update of comprehensive plan of care as evidenced by provider attestation and co-signature ?Inter-disciplinary care team collaboration (see longitudinal plan of care) ?Evaluation of current treatment plan related to  self management and patient's adherence to plan as established by provider ? ? ?CAD  (Status: Goal on Track (progressing): YES.) Long Term Goal  ?BP Readings from Last 3 Encounters:  ?12/01/21 136/72  ?11/08/21 138/68  ?10/27/21 136/72  ?  ?Lab Results  ?Component Value Date  ? CHOL 97 (L) 05/24/2021  ? HDL 34 (L) 05/24/2021  ? LDLCALC 46 05/24/2021  ? TRIG 86 05/24/2021  ? CHOLHDL 2.9 05/24/2021  ?  ?Assessed understanding of CAD diagnosis. 12-09-2021: The patient states that he understands his chronic conditions and is compliant with the plan of care.  ?Medications reviewed including medications utilized in CAD treatment plan. 12-09-2021: The patient is compliant with medications and treatment plan of CAD ?Provided education on importance of blood pressure control in management of CAD; ?Provided education on Importance of limiting foods high in cholesterol. 12-09-2021:  Review and education provided ?Counseled on importance of regular laboratory monitoring as prescribed. 12-09-2021: The patient has regular lab work; ?Counseled on the importance of exercise goals with target of 150 minutes per week ?Reviewed Importance of taking all medications as prescribed. 12-09-2021: The patient endorses compliance with medications ?Reviewed Importance of attending all scheduled provider appointments. 12-22-2021 with the pcp ?Advised to report any changes in symptoms or exercise tolerance. 12-09-2021: The patient played golf today and is active. Denies any acute findings today.  ?Screening for signs and symptoms of depression related to chronic disease state;  ?Assessed social determinant of health barriers;  ? ?Hyperlipidemia:  (Status: Goal on Track (progressing): YES.) Long Term Goal  ?Lab Results  ?Component Value Date  ? CHOL 97 (L) 05/24/2021  ? HDL 34 (L) 05/24/2021  ? LDLCALC 46 05/24/2021  ? TRIG 86 05/24/2021  ? CHOLHDL 2.9 05/24/2021  ?  ? ?Medication review performed; medication list updated in electronic medical record. 12-09-2021: The patient is compliant with Crestor 40 mg QD ?Provider established cholesterol goals reviewed; ?Counseled on importance of regular laboratory monitoring as prescribed. 12-09-2021: Education and review of regular lab testing; ?Provided HLD educational materials; ?Reviewed role and benefits of statin for ASCVD risk reduction; ?  Discussed strategies to manage statin-induced myalgias; ?Reviewed importance of limiting foods high in cholesterol. 12-09-2021: The patient is mindful of dietary restrictions and follows the plan of care; ?Reviewed exercise goals and target of 150 minutes per week; ? ?Hypertension: (Status: Goal on Track (progressing): YES.) ?Last practice recorded BP readings:  ?BP Readings from Last 3 Encounters:  ?12/01/21 136/72  ?11/08/21 138/68  ?10/27/21 136/72  ?Most recent eGFR/CrCl:  ?Lab Results  ?Component Value Date  ? EGFR 52 (L) 10/27/2021   ?  No components found for: CRCL ? ?Evaluation of current treatment plan related to hypertension self management and patient's adherence to plan as established by provider. 09-19-2021: The patient states t

## 2021-12-14 ENCOUNTER — Other Ambulatory Visit: Payer: Self-pay | Admitting: Internal Medicine

## 2021-12-14 DIAGNOSIS — E039 Hypothyroidism, unspecified: Secondary | ICD-10-CM

## 2021-12-14 DIAGNOSIS — I10 Essential (primary) hypertension: Secondary | ICD-10-CM

## 2021-12-15 NOTE — Telephone Encounter (Signed)
Carvedilol dose is 12.5 mg.  ? ?Requested Prescriptions  ?Pending Prescriptions Disp Refills  ?? levothyroxine (SYNTHROID) 50 MCG tablet [Pharmacy Med Name: Levothyroxine Sodium 50 MCG Oral Tablet] 90 tablet 3  ?  Sig: TAKE 1 TABLET BY MOUTH  DAILY  ?  ? Endocrinology:  Hypothyroid Agents Passed - 12/14/2021  6:49 AM  ?  ?  Passed - TSH in normal range and within 360 days  ?  TSH  ?Date Value Ref Range Status  ?05/24/2021 2.830 0.450 - 4.500 uIU/mL Final  ?   ?  ?  Passed - Valid encounter within last 12 months  ?  Recent Outpatient Visits   ?      ? 2 weeks ago Acute pain of left knee  ? [place] P, DO  ? 1 month ago Acute pain of left knee  ? [facility] Kathrine Haddock, NP  ? 1 month ago Acute anxiety  ? [place], Megan P, DO  ? 2 months ago Vertigo  ? [place], Megan P, DO  ? 2 months ago Essential hypertension  ? [facility] Jon Billings, NP  ?  ?  ?Future Appointments   ?        ? In 1 week Wynetta Emery, Barb Merino, DO [place], [facility]  ? In 3 weeks  [place], [facility]  ?  ? ?  ?  ?  ?? carvedilol (COREG) 25 MG tablet [Pharmacy Med Name: Carvedilol 25 MG Oral Tablet] 180 tablet   ?  Sig: TAKE 1 TABLET BY MOUTH TWICE  DAILY WITH A MEAL  ?  ? Cardiovascular: Beta Blockers 3 Failed - 12/14/2021  6:49 AM  ?  ?  Failed - Cr in normal range and within 360 days  ?  Creatinine, Ser  ?Date Value Ref Range Status  ?10/27/2021 1.44 (H) 0.76 - 1.27 mg/dL Final  ?   ?  ?  Passed - AST in normal range and within 360 days  ?  AST  ?Date Value Ref Range Status  ?05/24/2021 21 0 - 40 IU/L Final  ? ?AST (SGOT) [place], Waived  ?Date Value Ref Range Status  ?05/14/2019 33 11 - 38 U/L Final  ?   ?  ?  Passed - ALT in normal range and within 360 days  ?  ALT  ?Date Value Ref Range Status  ?05/24/2021 14 0 - 44 IU/L Final  ? ?ALT (SGPT) [place], Waived  ?Date Value Ref Range Status  ?05/14/2019 22 10 - 47 U/L  Final  ?   ?  ?  Passed - Last BP in normal range  ?  BP Readings from Last 1 Encounters:  ?12/01/21 136/72  ?   ?  ?  Passed - Last Heart Rate in normal range  ?  Pulse Readings from Last 1 Encounters:  ?12/01/21 61  ?   ?  ?  Passed - Valid encounter within last 6 months  ?  Recent Outpatient Visits   ?      ? 2 weeks ago Acute pain of left knee  ? [place] P, DO  ? 1 month ago Acute pain of left knee  ? [facility] Kathrine Haddock, NP  ? 1 month ago Acute anxiety  ? [place], Megan P, DO  ? 2 months ago Vertigo  ? [place], [place], DO  ?  2 months ago Essential hypertension  ? [facility] Jon Billings, NP  ?  ?  ?Future Appointments   ?        ? In 1 week Wynetta Emery, Barb Merino, DO [place], [facility]  ? In 3 weeks  [place], [facility]  ?  ? ?  ?  ?  ? ? ?

## 2021-12-19 ENCOUNTER — Ambulatory Visit: Payer: Medicare Other | Admitting: Family Medicine

## 2021-12-19 DIAGNOSIS — I2510 Atherosclerotic heart disease of native coronary artery without angina pectoris: Secondary | ICD-10-CM | POA: Diagnosis not present

## 2021-12-19 DIAGNOSIS — E782 Mixed hyperlipidemia: Secondary | ICD-10-CM | POA: Diagnosis not present

## 2021-12-19 DIAGNOSIS — I340 Nonrheumatic mitral (valve) insufficiency: Secondary | ICD-10-CM | POA: Diagnosis not present

## 2021-12-19 DIAGNOSIS — R55 Syncope and collapse: Secondary | ICD-10-CM | POA: Diagnosis not present

## 2021-12-19 DIAGNOSIS — I350 Nonrheumatic aortic (valve) stenosis: Secondary | ICD-10-CM | POA: Diagnosis not present

## 2021-12-19 DIAGNOSIS — I10 Essential (primary) hypertension: Secondary | ICD-10-CM | POA: Diagnosis not present

## 2021-12-21 DIAGNOSIS — E782 Mixed hyperlipidemia: Secondary | ICD-10-CM | POA: Diagnosis not present

## 2021-12-21 DIAGNOSIS — I350 Nonrheumatic aortic (valve) stenosis: Secondary | ICD-10-CM | POA: Diagnosis not present

## 2021-12-21 DIAGNOSIS — R55 Syncope and collapse: Secondary | ICD-10-CM | POA: Diagnosis not present

## 2021-12-22 ENCOUNTER — Ambulatory Visit (INDEPENDENT_AMBULATORY_CARE_PROVIDER_SITE_OTHER): Payer: Medicare Other | Admitting: Family Medicine

## 2021-12-22 ENCOUNTER — Encounter: Payer: Self-pay | Admitting: Family Medicine

## 2021-12-22 VITALS — BP 123/64 | HR 56 | Temp 97.9°F | Wt 231.4 lb

## 2021-12-22 DIAGNOSIS — R7303 Prediabetes: Secondary | ICD-10-CM | POA: Diagnosis not present

## 2021-12-22 DIAGNOSIS — N400 Enlarged prostate without lower urinary tract symptoms: Secondary | ICD-10-CM

## 2021-12-22 DIAGNOSIS — M1000 Idiopathic gout, unspecified site: Secondary | ICD-10-CM

## 2021-12-22 DIAGNOSIS — I2510 Atherosclerotic heart disease of native coronary artery without angina pectoris: Secondary | ICD-10-CM

## 2021-12-22 DIAGNOSIS — N182 Chronic kidney disease, stage 2 (mild): Secondary | ICD-10-CM

## 2021-12-22 DIAGNOSIS — E785 Hyperlipidemia, unspecified: Secondary | ICD-10-CM | POA: Diagnosis not present

## 2021-12-22 DIAGNOSIS — N029 Recurrent and persistent hematuria with unspecified morphologic changes: Secondary | ICD-10-CM | POA: Diagnosis not present

## 2021-12-22 DIAGNOSIS — M1712 Unilateral primary osteoarthritis, left knee: Secondary | ICD-10-CM | POA: Insufficient documentation

## 2021-12-22 DIAGNOSIS — I10 Essential (primary) hypertension: Secondary | ICD-10-CM

## 2021-12-22 DIAGNOSIS — E039 Hypothyroidism, unspecified: Secondary | ICD-10-CM

## 2021-12-22 DIAGNOSIS — I2583 Coronary atherosclerosis due to lipid rich plaque: Secondary | ICD-10-CM | POA: Diagnosis not present

## 2021-12-22 DIAGNOSIS — M170 Bilateral primary osteoarthritis of knee: Secondary | ICD-10-CM | POA: Insufficient documentation

## 2021-12-22 LAB — URINALYSIS, ROUTINE W REFLEX MICROSCOPIC
Bilirubin, UA: NEGATIVE
Glucose, UA: NEGATIVE
Ketones, UA: NEGATIVE
Leukocytes,UA: NEGATIVE
Nitrite, UA: NEGATIVE
Specific Gravity, UA: 1.015 (ref 1.005–1.030)
Urobilinogen, Ur: 0.2 mg/dL (ref 0.2–1.0)
pH, UA: 5.5 (ref 5.0–7.5)

## 2021-12-22 LAB — MICROSCOPIC EXAMINATION
Bacteria, UA: NONE SEEN
Epithelial Cells (non renal): NONE SEEN /hpf (ref 0–10)
WBC, UA: NONE SEEN /hpf (ref 0–5)

## 2021-12-22 LAB — BAYER DCA HB A1C WAIVED: HB A1C (BAYER DCA - WAIVED): 5.6 % (ref 4.8–5.6)

## 2021-12-22 MED ORDER — CARVEDILOL 12.5 MG PO TABS: 12.5000 mg | ORAL_TABLET | Freq: Two times a day (BID) | ORAL | 0 refills | Status: DC

## 2021-12-22 MED ORDER — AMLODIPINE BESYLATE 10 MG PO TABS: 10.0000 mg | ORAL_TABLET | Freq: Every evening | ORAL | 0 refills | Status: DC

## 2021-12-22 MED ORDER — ALLOPURINOL 300 MG PO TABS: 300.0000 mg | ORAL_TABLET | Freq: Every day | ORAL | 0 refills | Status: DC

## 2021-12-22 MED ORDER — ROSUVASTATIN CALCIUM 40 MG PO TABS: 40.0000 mg | ORAL_TABLET | Freq: Every day | ORAL | 0 refills | Status: DC

## 2021-12-22 MED ORDER — FLUTICASONE PROPIONATE 50 MCG/ACT NA SUSP: 2.0000 | Freq: Every day | NASAL | 6 refills | Status: DC

## 2021-12-22 MED ORDER — CLOPIDOGREL BISULFATE 75 MG PO TABS: 75.0000 mg | ORAL_TABLET | Freq: Every day | ORAL | 0 refills | Status: DC

## 2021-12-22 MED ORDER — DICLOFENAC SODIUM 1 % EX GEL: 4.0000 g | Freq: Four times a day (QID) | CUTANEOUS | 3 refills | Status: DC

## 2021-12-22 MED ORDER — BENAZEPRIL HCL 20 MG PO TABS: 20.0000 mg | ORAL_TABLET | Freq: Every day | ORAL | 0 refills | Status: DC

## 2021-12-22 NOTE — Progress Notes (Signed)
? ?BP 123/64 Comment: home blood pressure  Pulse (!) 56   Temp 97.9 ?F (36.6 ?C)   Wt 231 lb 6.4 oz (105 kg)   SpO2 97%   BMI 36.23 kg/m?   ? ?Subjective:  ? ? Patient ID: Ricky Melton, male    DOB: 1951/03/22, 71 y.o.   MRN: [id number] ? ?HPI: ?Ricky Melton is a 71 y.o. male ? ?Chief Complaint  ?Patient presents with  ? Knee Pain  ?  Patient states his knee is a little better, but states he has been trying to keep weight off his knee and its making his right hip hurt  ? ?HYPERTENSION / HYPERLIPIDEMIA ?Satisfied with current treatment? yes ?Duration of hypertension: chronic ?BP monitoring frequency: not checking ?BP medication side effects: no ?Past BP meds: amlodipine, benazepril, carvedilol ?Duration of hyperlipidemia: chronic ?Cholesterol medication side effects: no ?Cholesterol supplements: none ?Past cholesterol medications: crestor ?Medication compliance: excellent compliance ?Aspirin: yes ?Recent stressors: no ?Recurrent headaches: no ?Visual changes: no ?Palpitations: no ?Dyspnea: no ?Chest pain: no ?Lower extremity edema: no ?Dizzy/lightheaded: no ? ?Impaired Fasting Glucose ?HbA1C:  ?Lab Results  ?Component Value Date  ? HGBA1C 5.6 12/22/2021  ? ?Duration of elevated blood sugar: chronic ?Polydipsia: no ?Polyuria: no ?Weight change: no ?Visual disturbance: no ?Glucose Monitoring: no ?   Accucheck frequency: Not Checking ?Diabetic Education: Not Completed ?Family history of diabetes: yes ? ?No gout flares. Tolerating the medicine well.  ? ?HYPOTHYROIDISM ?Thyroid control status:controlled ?Satisfied with current treatment? yes ?Medication side effects: no ?Medication compliance: excellent compliance ?Recent dose adjustment:no ?Fatigue: no ?Cold intolerance: no ?Heat intolerance: no ?Weight gain: no ?Weight loss: no ?Constipation: no ?Diarrhea/loose stools: no ?Palpitations: no ?Lower extremity edema: no ?Anxiety/depressed mood: no ? ?KNEE PAIN ?Duration: chronic ?Involved knee: left ?Mechanism of  injury: unknown ?Location:diffuse ?Onset: gradual ?Severity: moderate  ?Quality:  aching and sore ?Frequency: constant ?Radiation: no ?Aggravating factors: weight bearing, walking, running, stairs, and bending  ?Alleviating factors: voltaren, medrol dose pak  ?Status: better- but not much ?Treatments attempted: rest, ice, heat, APAP, ibuprofen, aleve, and HEP  ?Relief with NSAIDs?:  mild ?Weakness with weight bearing or walking: yes ?Sensation of giving way: no ?Locking: no ?Popping: no ?Bruising: no ?Swelling: no ?Redness: no ?Paresthesias/decreased sensation: no ?Fevers: no ? ? ?Relevant past medical, surgical, family and social history reviewed and updated as indicated. Interim medical history since our last visit reviewed. ?Allergies and medications reviewed and updated. ? ?Review of Systems  ?Constitutional: Negative.   ?Respiratory: Negative.    ?Cardiovascular: Negative.   ?Gastrointestinal: Negative.   ?Musculoskeletal:  Positive for arthralgias. Negative for back pain, gait problem, joint swelling, myalgias, neck pain and neck stiffness.  ?Neurological: Negative.   ?Psychiatric/Behavioral: Negative.    ? ?Per HPI unless specifically indicated above ? ?   ?Objective:  ?  ?BP 123/64 Comment: home blood pressure  Pulse (!) 56   Temp 97.9 ?F (36.6 ?C)   Wt 231 lb 6.4 oz (105 kg)   SpO2 97%   BMI 36.23 kg/m?   ?Wt Readings from Last 3 Encounters:  ?12/22/21 231 lb 6.4 oz (105 kg)  ?12/01/21 227 lb (103 kg)  ?11/08/21 229 lb 12.8 oz (104.2 kg)  ?  ?Physical Exam ?Vitals and nursing note reviewed.  ?Constitutional:   ?   General: He is not in acute distress. ?   Appearance: Normal appearance. He is not ill-appearing, toxic-appearing or diaphoretic.  ?HENT:  ?   Head: Normocephalic and atraumatic.  ?  Right Ear: External ear normal.  ?   Left Ear: External ear normal.  ?   Nose: Nose normal.  ?   Mouth/Throat:  ?   Mouth: Mucous membranes are moist.  ?   Pharynx: Oropharynx is clear.  ?Eyes:  ?   General: No  scleral icterus.    ?   Right eye: No discharge.     ?   Left eye: No discharge.  ?   Extraocular Movements: Extraocular movements intact.  ?   Conjunctiva/sclera: Conjunctivae normal.  ?   Pupils: Pupils are equal, round, and reactive to light.  ?Cardiovascular:  ?   Rate and Rhythm: Normal rate and regular rhythm.  ?   Pulses: Normal pulses.  ?   Heart sounds: Normal heart sounds. No murmur heard. ?  No friction rub. No gallop.  ?Pulmonary:  ?   Effort: Pulmonary effort is normal. No respiratory distress.  ?   Breath sounds: Normal breath sounds. No stridor. No wheezing, rhonchi or rales.  ?Chest:  ?   Chest wall: No tenderness.  ?Musculoskeletal:     ?   General: Tenderness (along joint like of L knee) present.  ?   Cervical back: Normal range of motion and neck supple.  ?Skin: ?   General: Skin is warm and dry.  ?   Capillary Refill: Capillary refill takes less than 2 seconds.  ?   Coloration: Skin is not jaundiced or pale.  ?   Findings: No bruising, erythema, lesion or rash.  ?Neurological:  ?   General: No focal deficit present.  ?   Mental Status: He is alert and oriented to person, place, and time. Mental status is at baseline.  ?Psychiatric:     ?   Mood and Affect: Mood normal.     ?   Behavior: Behavior normal.     ?   Thought Content: Thought content normal.     ?   Judgment: Judgment normal.  ? ? ?Results for orders placed or performed in visit on 12/22/21  ?Microscopic Examination  ? BLD  ?Result Value Ref Range  ? WBC, UA None seen 0 - 5 /hpf  ? RBC 0-2 0 - 2 /hpf  ? Epithelial Cells (non renal) None seen 0 - 10 /hpf  ? Mucus, UA Present (A) Not Estab.  ? Bacteria, UA None seen None seen/Few  ?Bayer DCA Hb A1c Waived  ?Result Value Ref Range  ? HB A1C (BAYER DCA - WAIVED) 5.6 4.8 - 5.6 %  ?Urinalysis, Routine w reflex microscopic  ?Result Value Ref Range  ? Specific Gravity, UA 1.015 1.005 - 1.030  ? pH, UA 5.5 5.0 - 7.5  ? Color, UA Yellow Yellow  ? Appearance Ur Clear Clear  ? Leukocytes,UA  Negative Negative  ? Protein,UA 1+ (A) Negative/Trace  ? Glucose, UA Negative Negative  ? Ketones, UA Negative Negative  ? RBC, UA Trace (A) Negative  ? Bilirubin, UA Negative Negative  ? Urobilinogen, Ur 0.2 0.2 - 1.0 mg/dL  ? Nitrite, UA Negative Negative  ? Microscopic Examination See below:   ? ?   ?Assessment & Plan:  ? ?Problem List Items Addressed This Visit   ? ?  ? Cardiovascular and Mediastinum  ? CAD (coronary artery disease)  ?  Will keep BP and cholesterol under good control. Continue current regimen. Call with any concerns. Continue to monitor.  ?  ?  ? Essential hypertension  ?  Under good control on current  regimen. Continue current regimen. Continue to monitor. Call with any concerns. Refills given. Labs drawn today.  ? ?  ?  ? Relevant Orders  ? Comprehensive metabolic panel  ? CBC with Differential/Platelet  ?  ? Endocrine  ? Hypothyroidism  ?  Rechecking labs today. Await results. Treat as needed.  ?  ?  ? Relevant Orders  ? Comprehensive metabolic panel  ? CBC with Differential/Platelet  ? TSH  ?  ? Musculoskeletal and Integument  ? Arthritis of left knee - Primary  ?  ? Genitourinary  ? CKD (chronic kidney disease), stage II  ?  Rechecking labs today. Await results. Treat as needed.  ?  ?  ? Benign hematuria  ?  Rechecking labs today. Await results. Treat as needed.  ?  ?  ? Relevant Orders  ? Comprehensive metabolic panel  ? CBC with Differential/Platelet  ? Urinalysis, Routine w reflex microscopic (Completed)  ? BPH (benign prostatic hyperplasia)  ?  Under good control on current regimen. Continue current regimen. Continue to monitor. Call with any concerns. Refills given. Labs drawn today.  ? ?  ?  ?  ? Other  ? Hyperlipidemia  ?  Under good control on current regimen. Continue current regimen. Continue to monitor. Call with any concerns. Refills given. Labs drawn today.  ? ?  ?  ? Relevant Orders  ? Comprehensive metabolic panel  ? CBC with Differential/Platelet  ? Lipid Panel w/o Chol/HDL  Ratio  ? Gout  ? Relevant Orders  ? Comprehensive metabolic panel  ? CBC with Differential/Platelet  ? Uric acid  ? Morbid obesity ([place])  ?  Encouraged diet and exercise with goal of losing 1-2lbs per week. Continue to

## 2021-12-22 NOTE — Assessment & Plan Note (Signed)
Under good control on current regimen. Continue current regimen. Continue to monitor. Call with any concerns. Refills given. Labs drawn today.   

## 2021-12-22 NOTE — Assessment & Plan Note (Signed)
Rechecking labs today. Await results. Treat as needed.  °

## 2021-12-22 NOTE — Assessment & Plan Note (Signed)
Will keep BP and cholesterol under good control. Continue current regimen. Call with any concerns. Continue to monitor.  ?

## 2021-12-22 NOTE — Assessment & Plan Note (Signed)
Encouraged diet and exercise with goal of losing 1-2 lbs per week. Continue to monitor.  

## 2021-12-23 ENCOUNTER — Encounter: Payer: Self-pay | Admitting: Family Medicine

## 2021-12-23 DIAGNOSIS — E782 Mixed hyperlipidemia: Secondary | ICD-10-CM | POA: Diagnosis not present

## 2021-12-23 DIAGNOSIS — I2510 Atherosclerotic heart disease of native coronary artery without angina pectoris: Secondary | ICD-10-CM | POA: Diagnosis not present

## 2021-12-23 DIAGNOSIS — R55 Syncope and collapse: Secondary | ICD-10-CM | POA: Diagnosis not present

## 2021-12-23 DIAGNOSIS — I10 Essential (primary) hypertension: Secondary | ICD-10-CM | POA: Diagnosis not present

## 2021-12-23 DIAGNOSIS — I340 Nonrheumatic mitral (valve) insufficiency: Secondary | ICD-10-CM | POA: Diagnosis not present

## 2021-12-23 LAB — LIPID PANEL W/O CHOL/HDL RATIO
Cholesterol, Total: 111 mg/dL (ref 100–199)
HDL: 40 mg/dL (ref >39)
LDL Chol Calc (NIH): 53 mg/dL (ref 0–99)
Triglycerides: 92 mg/dL (ref 0–149)
VLDL Cholesterol Cal: 18 mg/dL (ref 5–40)

## 2021-12-23 LAB — CBC WITH DIFFERENTIAL/PLATELET
Basophils Absolute: 0.1 10*3/uL (ref 0.0–0.2)
Basos: 1 %
EOS (ABSOLUTE): 0.2 10*3/uL (ref 0.0–0.4)
Eos: 3 %
Hematocrit: 45.4 % (ref 37.5–51.0)
Hemoglobin: 15.2 g/dL (ref 13.0–17.7)
Immature Grans (Abs): 0 10*3/uL (ref 0.0–0.1)
Immature Granulocytes: 0 %
Lymphocytes Absolute: 2.8 10*3/uL (ref 0.7–3.1)
Lymphs: 36 %
MCH: 28.7 pg (ref 26.6–33.0)
MCHC: 33.5 g/dL (ref 31.5–35.7)
MCV: 86 fL (ref 79–97)
Monocytes Absolute: 0.8 10*3/uL (ref 0.1–0.9)
Monocytes: 10 %
Neutrophils Absolute: 3.8 10*3/uL (ref 1.4–7.0)
Neutrophils: 50 %
Platelets: 206 10*3/uL (ref 150–450)
RBC: 5.29 x10E6/uL (ref 4.14–5.80)
RDW: 13.6 % (ref 11.6–15.4)
WBC: 7.6 10*3/uL (ref 3.4–10.8)

## 2021-12-23 LAB — COMPREHENSIVE METABOLIC PANEL
ALT: 14 IU/L (ref 0–44)
AST: 21 IU/L (ref 0–40)
Albumin/Globulin Ratio: 2 (ref 1.2–2.2)
Albumin: 4.7 g/dL (ref 3.7–4.7)
Alkaline Phosphatase: 61 IU/L (ref 44–121)
BUN/Creatinine Ratio: 13 (ref 10–24)
BUN: 19 mg/dL (ref 8–27)
Bilirubin Total: 0.5 mg/dL (ref 0.0–1.2)
CO2: 22 mmol/L (ref 20–29)
Calcium: 9.8 mg/dL (ref 8.6–10.2)
Chloride: 104 mmol/L (ref 96–106)
Creatinine, Ser: 1.44 mg/dL — ABNORMAL HIGH (ref 0.76–1.27)
Globulin, Total: 2.3 g/dL (ref 1.5–4.5)
Glucose: 91 mg/dL (ref 70–99)
Potassium: 5 mmol/L (ref 3.5–5.2)
Sodium: 145 mmol/L — ABNORMAL HIGH (ref 134–144)
Total Protein: 7 g/dL (ref 6.0–8.5)
eGFR: 52 mL/min/{1.73_m2} — ABNORMAL LOW (ref >59)

## 2021-12-23 LAB — TSH: TSH: 2.26 u[IU]/mL (ref 0.450–4.500)

## 2021-12-23 LAB — URIC ACID: Uric Acid: 5.3 mg/dL (ref 3.8–8.4)

## 2022-01-05 ENCOUNTER — Ambulatory Visit (INDEPENDENT_AMBULATORY_CARE_PROVIDER_SITE_OTHER): Payer: Medicare Other | Admitting: *Deleted

## 2022-01-05 DIAGNOSIS — Z0000 Encounter for general adult medical examination without abnormal findings: Secondary | ICD-10-CM

## 2022-01-05 NOTE — Progress Notes (Signed)
? ?Subjective:  ? Ricky Melton is a 71 y.o. male who presents for Medicare Annual/Subsequent preventive examination. ? ?I connected with  Curt Bears on 01/05/22 by a telephone enabled telemedicine application and verified that I am speaking with the correct person using two identifiers. ?  ?I discussed the limitations of evaluation and management by telemedicine. The patient expressed understanding and agreed to proceed. ? ?Patient location: home ? ?Provider location: tele-Health not in office ? ? ? ?Review of Systems    ? ?Cardiac Risk Factors include: advanced age (>51mn, >>35women);hypertension;male gender;obesity (BMI >30kg/m2) ? ?   ?Objective:  ?  ?Today's Vitals  ? ?There is no height or weight on file to calculate BMI. ? ? ?  01/05/2022  ? 12:03 PM 01/03/2021  ?  1:56 PM 06/26/2020  ?  1:22 PM 11/19/2019  ? 10:33 AM 11/07/2018  ? 11:10 AM 05/10/2018  ?  9:49 AM 10/25/2017  ?  8:28 AM  ?Advanced Directives  ?Does Patient Have a Medical Advance Directive? No No No No No No No  ?Would patient like information on creating a medical advance directive? No - Patient declined    Yes (MAU/Ambulatory/Procedural Areas - Information given)  Yes (MAU/Ambulatory/Procedural Areas - Information given)  ? ? ?Current Medications (verified) ?Outpatient Encounter Medications as of 01/05/2022  ?Medication Sig  ? allopurinol (ZYLOPRIM) 300 MG tablet Take 1 tablet (300 mg total) by mouth daily.  ? amLODipine (NORVASC) 10 MG tablet Take 1 tablet (10 mg total) by mouth every evening.  ? aspirin 81 MG tablet Take 81 mg by mouth daily.  ? benazepril (LOTENSIN) 20 MG tablet Take 1 tablet (20 mg total) by mouth daily.  ? carvedilol (COREG) 12.5 MG tablet Take 1 tablet (12.5 mg total) by mouth 2 (two) times daily with a meal.  ? clopidogrel (PLAVIX) 75 MG tablet Take 1 tablet (75 mg total) by mouth daily.  ? diclofenac Sodium (VOLTAREN) 1 % GEL Apply 4 g topically 4 (four) times daily.  ? fluticasone (FLONASE) 50 MCG/ACT nasal spray  Place 2 sprays into both nostrils daily.  ? levothyroxine (SYNTHROID) 50 MCG tablet TAKE 1 TABLET BY MOUTH  DAILY  ? meclizine (ANTIVERT) 25 MG tablet Take 1 tablet (25 mg total) by mouth 3 (three) times daily as needed for dizziness.  ? rosuvastatin (CRESTOR) 40 MG tablet Take 1 tablet (40 mg total) by mouth daily.  ? ?No facility-administered encounter medications on file as of 01/05/2022.  ? ? ?Allergies (verified) ?Hydrochlorothiazide  ? ?History: ?Past Medical History:  ?Diagnosis Date  ? Benign hematuria   ? CAD (coronary artery disease)   ? Chronic kidney disease   ? Hyperlipidemia   ? Hypertension   ? ?Past Surgical History:  ?Procedure Laterality Date  ? cardiac stents    ? x2  ? COLONOSCOPY WITH PROPOFOL N/A 05/10/2018  ? Procedure: COLONOSCOPY WITH PROPOFOL;  Surgeon: AJonathon Bellows MD;  [facility];  Service: Gastroenterology;  Laterality: N/A;  ? EYE SURGERY Right 2021  ? cataract sx in [place]   ? ?Family History  ?Problem Relation Age of Onset  ? Hypertension Mother   ? Diabetes Sister   ? Diabetes Son   ? ?Social History  ? ?Socioeconomic History  ? Marital status: Married  ?  Spouse name: Not on file  ? Number of children: Not on file  ? Years of education: Not on file  ? Highest education level: High school graduate  ?Occupational History  ?  Occupation: maintenence   ?  Comment: part time   ?Tobacco Use  ? Smoking status: Never  ? Smokeless tobacco: Former  ?  Types: Chew  ?  Quit date: 2012  ?Vaping Use  ? Vaping Use: Never used  ?Substance and Sexual Activity  ? Alcohol use: No  ?  Alcohol/week: 0.0 standard drinks  ? Drug use: No  ? Sexual activity: Yes  ?Other Topics Concern  ? Not on file  ?Social History Narrative  ? Plays golf with friends, mows yards   ? ?Social Determinants of Health  ? ?Financial Resource Strain: Low Risk   ? Difficulty of Paying Living Expenses: Not hard at all  ?Food Insecurity: No Food Insecurity  ? Worried About Charity fundraiser in the Last Year: Never  true  ? Ran Out of Food in the Last Year: Never true  ?Transportation Needs: No Transportation Needs  ? Lack of Transportation (Medical): No  ? Lack of Transportation (Non-Medical): No  ?Physical Activity: Insufficiently Active  ? Days of Exercise per Week: 2 days  ? Minutes of Exercise per Session: 60 min  ?Stress: No Stress Concern Present  ? Feeling of Stress : Not at all  ?Social Connections: Socially Integrated  ? Frequency of Communication with Friends and Family: More than three times a week  ? Frequency of Social Gatherings with Friends and Family: More than three times a week  ? Attends Religious Services: More than 4 times per year  ? Active Member of Clubs or Organizations: Yes  ? Attends Archivist Meetings: More than 4 times per year  ? Marital Status: Married  ? ? ?Tobacco Counseling ?Counseling given: Not Answered ? ? ?Clinical Intake: ? ?Pre-visit preparation completed: Yes ? ?Pain : No/denies pain ? ?  ? ?Nutritional Risks: None ?Diabetes: No ? ?How often do you need to have someone help you when you read instructions, pamphlets, or other written materials from your doctor or pharmacy?: 1 - Never ? ?Diabetic?  no ? ?Interpreter Needed?: No ? ?Information entered by :: Leroy Kennedy LPN ? ? ?Activities of Daily Living ? ?  01/05/2022  ? 12:09 PM  ?In your present state of health, do you have any difficulty performing the following activities:  ?Hearing? 0  ?Vision? 0  ?Difficulty concentrating or making decisions? 0  ?Walking or climbing stairs? 0  ?Dressing or bathing? 0  ?Doing errands, shopping? 0  ?Preparing Food and eating ? N  ?Using the Toilet? N  ?In the past six months, have you accidently leaked urine? N  ?Do you have problems with loss of bowel control? N  ?Managing your Medications? N  ?Managing your Finances? N  ? ? ?Patient Care Team: ?Valerie Roys, DO as PCP - General (Family Medicine) ?Dionisio David, MD as Consulting Physician (Cardiology) ?Vanita Ingles, RN as Case  Manager (General Practice) ? ?Indicate any recent Medical Services you may have received from other than [facility] providers in the past year (date may be approximate). ? ?   ?Assessment:  ? This is a routine wellness examination for [facility]. ? ?Hearing/Vision screen ?Hearing Screening - Comments:: No trouble hearing ?Vision Screening - Comments:: Up to date  ?In [place] unsure of name ? ?Dietary issues and exercise activities discussed: ?Current Exercise Habits: The patient does not participate in regular exercise at present, Exercise limited by: None identified ? ? Goals Addressed   ? ?  ?  ?  ?  ? This Visit's  Progress  ?  Weight (lb) < 200 lb (90.7 kg)     ?  Get off some medications ?  ? ?  ? ?Depression Screen ? ?  01/05/2022  ? 12:08 PM 12/22/2021  ?  9:13 AM 10/27/2021  ?  8:52 AM 10/13/2021  ?  1:12 PM 09/30/2021  ? 10:19 AM 09/16/2021  ?  9:51 AM 07/07/2021  ? 10:14 AM  ?PHQ 2/9 Scores  ?PHQ - 2 Score 0 0 0 0 0 0 0  ?PHQ- 9 Score 0 0 0 0 0 0 0  ?  ?Fall Risk ? ?  01/05/2022  ? 12:01 PM 12/22/2021  ?  9:12 AM 12/22/2021  ?  9:11 AM 09/30/2021  ? 10:19 AM 09/16/2021  ?  9:51 AM  ?Fall Risk   ?Falls in the past year? 0 0 0 0 0  ?Number falls in past yr: 0  0 0 0  ?Injury with Fall? 0  0 0 0  ?Risk for fall due to :   No Fall Risks No Fall Risks No Fall Risks  ?Follow up Education provided;Falls prevention discussed;Falls evaluation completed  Falls evaluation completed Falls evaluation completed Falls evaluation completed  ? ? ?FALL RISK PREVENTION PERTAINING TO THE HOME: ? ?Any stairs in or around the home? No  ?If so, are there any without handrails? No  ?Home free of loose throw rugs in walkways, pet beds, electrical cords, etc? Yes  ?Adequate lighting in your home to reduce risk of falls? Yes  ? ?ASSISTIVE DEVICES UTILIZED TO PREVENT FALLS: ? ?Life alert? No  ?Use of a cane, walker or w/c? No  ?Grab bars in the bathroom? No  ?Shower chair or bench in shower? No  ?Elevated toilet seat or a handicapped toilet? Yes  ? ?TIMED  UP AND GO: ? ?Was the test performed? No .  ? ? ?Cognitive Function: ?  ?  ? ?  01/05/2022  ? 12:02 PM 01/03/2021  ?  2:00 PM 11/19/2019  ? 10:39 AM 11/07/2018  ? 11:14 AM 11/07/2018  ? 11:13 AM  ?6CIT Screen  ?

## 2022-01-05 NOTE — Patient Instructions (Signed)
Ricky Melton , ?Thank you for taking time to come for your Medicare Wellness Visit. I appreciate your ongoing commitment to your health goals. Please review the following plan we discussed and let me know if I can assist you in the future.  ? ?Screening recommendations/referrals: ?Colonoscopy: up to date ?Recommended yearly ophthalmology/optometry visit for glaucoma screening and checkup ?Recommended yearly dental visit for hygiene and checkup ? ?Vaccinations: ?Influenza vaccine: up to date ?Pneumococcal vaccine: up to date ?Tdap vaccine: up to date ?Shingles vaccine: 1 of 2   ? ?Advanced directives: Education provided ? ?Conditions/risks identified:  ? ?Next appointment: 07-13-2022 112 8:00 Johnson ? ?Preventive Care 18 Years and Older, Male ?Preventive care refers to lifestyle choices and visits with your health care provider that can promote health and wellness. ?What does preventive care include? ?A yearly physical exam. This is also called an annual well check. ?Dental exams once or twice a year. ?Routine eye exams. Ask your health care provider how often you should have your eyes checked. ?Personal lifestyle choices, including: ?Daily care of your teeth and gums. ?Regular physical activity. ?Eating a healthy diet. ?Avoiding tobacco and drug use. ?Limiting alcohol use. ?Practicing safe sex. ?Taking low doses of aspirin every day. ?Taking vitamin and mineral supplements as recommended by your health care provider. ?What happens during an annual well check? ?The services and screenings done by your health care provider during your annual well check will depend on your age, overall health, lifestyle risk factors, and family history of disease. ?Counseling  ?Your health care provider may ask you questions about your: ?Alcohol use. ?Tobacco use. ?Drug use. ?Emotional well-being. ?Home and relationship well-being. ?Sexual activity. ?Eating habits. ?History of falls. ?Memory and ability to understand (cognition). ?Work  and work Statistician. ?Screening  ?You may have the following tests or measurements: ?Height, weight, and BMI. ?Blood pressure. ?Lipid and cholesterol levels. These may be checked every 5 years, or more frequently if you are over 27 years old. ?Skin check. ?Lung cancer screening. You may have this screening every year starting at age 78 if you have a 30-pack-year history of smoking and currently smoke or have quit within the past 15 years. ?Fecal occult blood test (FOBT) of the stool. You may have this test every year starting at age 47. ?Flexible sigmoidoscopy or colonoscopy. You may have a sigmoidoscopy every 5 years or a colonoscopy every 10 years starting at age 32. ?Prostate cancer screening. Recommendations will vary depending on your family history and other risks. ?Hepatitis C blood test. ?Hepatitis B blood test. ?Sexually transmitted disease (STD) testing. ?Diabetes screening. This is done by checking your blood sugar (glucose) after you have not eaten for a while (fasting). You may have this done every 1-3 years. ?Abdominal aortic aneurysm (AAA) screening. You may need this if you are a current or former smoker. ?Osteoporosis. You may be screened starting at age 21 if you are at high risk. ?Talk with your health care provider about your test results, treatment options, and if necessary, the need for more tests. ?Vaccines  ?Your health care provider may recommend certain vaccines, such as: ?Influenza vaccine. This is recommended every year. ?Tetanus, diphtheria, and acellular pertussis (Tdap, Td) vaccine. You may need a Td booster every 10 years. ?Zoster vaccine. You may need this after age 58. ?Pneumococcal 13-valent conjugate (PCV13) vaccine. One dose is recommended after age 2. ?Pneumococcal polysaccharide (PPSV23) vaccine. One dose is recommended after age 39. ?Talk to your health care provider about which screenings and  vaccines you need and how often you need them. ?This information is not intended  to replace advice given to you by your health care provider. Make sure you discuss any questions you have with your health care provider. ?Document Released: 09/24/2015 Document Revised: 05/17/2016 Document Reviewed: 06/29/2015 ?Elsevier Interactive Patient Education ? 2017 [place]. ? ?Fall Prevention in the Home ?Falls can cause injuries. They can happen to people of all ages. There are many things you can do to make your home safe and to help prevent falls. ?What can I do on the outside of my home? ?Regularly fix the edges of walkways and driveways and fix any cracks. ?Remove anything that might make you trip as you walk through a door, such as a raised step or threshold. ?Trim any bushes or trees on the path to your home. ?Use bright outdoor lighting. ?Clear any walking paths of anything that might make someone trip, such as rocks or tools. ?Regularly check to see if handrails are loose or broken. Make sure that both sides of any steps have handrails. ?Any raised decks and porches should have guardrails on the edges. ?Have any leaves, snow, or ice cleared regularly. ?Use sand or salt on walking paths during winter. ?Clean up any spills in your garage right away. This includes oil or grease spills. ?What can I do in the bathroom? ?Use night lights. ?Install grab bars by the toilet and in the tub and shower. Do not use towel bars as grab bars. ?Use non-skid mats or decals in the tub or shower. ?If you need to sit down in the shower, use a plastic, non-slip stool. ?Keep the floor dry. Clean up any water that spills on the floor as soon as it happens. ?Remove soap buildup in the tub or shower regularly. ?Attach bath mats securely with double-sided non-slip rug tape. ?Do not have throw rugs and other things on the floor that can make you trip. ?What can I do in the bedroom? ?Use night lights. ?Make sure that you have a light by your bed that is easy to reach. ?Do not use any sheets or blankets that are too big  for your bed. They should not hang down onto the floor. ?Have a firm chair that has side arms. You can use this for support while you get dressed. ?Do not have throw rugs and other things on the floor that can make you trip. ?What can I do in the kitchen? ?Clean up any spills right away. ?Avoid walking on wet floors. ?Keep items that you use a lot in easy-to-reach places. ?If you need to reach something above you, use a strong step stool that has a grab bar. ?Keep electrical cords out of the way. ?Do not use floor polish or wax that makes floors slippery. If you must use wax, use non-skid floor wax. ?Do not have throw rugs and other things on the floor that can make you trip. ?What can I do with my stairs? ?Do not leave any items on the stairs. ?Make sure that there are handrails on both sides of the stairs and use them. Fix handrails that are broken or loose. Make sure that handrails are as long as the stairways. ?Check any carpeting to make sure that it is firmly attached to the stairs. Fix any carpet that is loose or worn. ?Avoid having throw rugs at the top or bottom of the stairs. If you do have throw rugs, attach them to the floor with carpet tape. ?Make  sure that you have a light switch at the top of the stairs and the bottom of the stairs. If you do not have them, ask someone to add them for you. ?What else can I do to help prevent falls? ?Wear shoes that: ?Do not have high heels. ?Have rubber bottoms. ?Are comfortable and fit you well. ?Are closed at the toe. Do not wear sandals. ?If you use a stepladder: ?Make sure that it is fully opened. Do not climb a closed stepladder. ?Make sure that both sides of the stepladder are locked into place. ?Ask someone to hold it for you, if possible. ?Clearly mark and make sure that you can see: ?Any grab bars or handrails. ?First and last steps. ?Where the edge of each step is. ?Use tools that help you move around (mobility aids) if they are needed. These  include: ?Canes. ?Walkers. ?Scooters. ?Crutches. ?Turn on the lights when you go into a dark area. Replace any light bulbs as soon as they burn out. ?Set up your furniture so you have a clear path. Avoid moving your furn

## 2022-01-06 ENCOUNTER — Ambulatory Visit: Payer: Medicare Other

## 2022-02-03 ENCOUNTER — Telehealth: Payer: Medicare Other

## 2022-02-24 ENCOUNTER — Other Ambulatory Visit: Payer: Self-pay | Admitting: Family Medicine

## 2022-02-24 DIAGNOSIS — E785 Hyperlipidemia, unspecified: Secondary | ICD-10-CM

## 2022-02-24 DIAGNOSIS — I10 Essential (primary) hypertension: Secondary | ICD-10-CM

## 2022-02-24 NOTE — Telephone Encounter (Signed)
Requested Prescriptions  Pending Prescriptions Disp Refills  . clopidogrel (PLAVIX) 75 MG tablet [Pharmacy Med Name: Clopidogrel Bisulfate 75 MG Oral Tablet] 90 tablet 1    Sig: TAKE 1 TABLET BY MOUTH DAILY     Hematology: Antiplatelets - clopidogrel Failed - 02/24/2022 12:36 AM      Failed - Cr in normal range and within 360 days    Creatinine, Ser  Date Value Ref Range Status  12/22/2021 1.44 (H) 0.76 - 1.27 mg/dL Final         Passed - HCT in normal range and within 180 days    Hematocrit  Date Value Ref Range Status  12/22/2021 45.4 37.5 - 51.0 % Final         Passed - HGB in normal range and within 180 days    Hemoglobin  Date Value Ref Range Status  12/22/2021 15.2 13.0 - 17.7 g/dL Final         Passed - PLT in normal range and within 180 days    Platelets  Date Value Ref Range Status  12/22/2021 206 150 - 450 x10E3/uL Final         Passed - Valid encounter within last 6 months    Recent Outpatient Visits          2 months ago Arthritis of left knee   [place], Megan P, DO   2 months ago Acute pain of left knee   [facility] [place], Megan P, DO   3 months ago Acute pain of left knee   [facility] Kathrine Haddock, NP   4 months ago Acute anxiety   [place], [place], DO   4 months ago Vertigo   [place], [place], DO      Future Appointments            In 4 months [place], Megan P, DO [facility]           . carvedilol (COREG) 12.5 MG tablet [Pharmacy Med Name: Carvedilol 12.5 MG Oral Tablet] 180 tablet 1    Sig: TAKE 1 TABLET BY MOUTH TWICE  DAILY WITH MEALS     Cardiovascular: Beta Blockers 3 Failed - 02/24/2022 12:36 AM      Failed - Cr in normal range and within 360 days    Creatinine, Ser  Date Value Ref Range Status  12/22/2021 1.44 (H) 0.76 - 1.27 mg/dL Final         Passed - AST in normal range and within 360 days    AST  Date  Value Ref Range Status  12/22/2021 21 0 - 40 IU/L Final   AST (SGOT) Piccolo, Waived  Date Value Ref Range Status  05/14/2019 33 11 - 38 U/L Final         Passed - ALT in normal range and within 360 days    ALT  Date Value Ref Range Status  12/22/2021 14 0 - 44 IU/L Final   ALT (SGPT) Piccolo, Waived  Date Value Ref Range Status  05/14/2019 22 10 - 47 U/L Final         Passed - Last BP in normal range    BP Readings from Last 1 Encounters:  12/22/21 123/64         Passed - Last Heart Rate in normal range    Pulse Readings from Last 1 Encounters:  12/22/21 (!) 56  Passed - Valid encounter within last 6 months    Recent Outpatient Visits          2 months ago Arthritis of left knee   [place], Megan P, DO   2 months ago Acute pain of left knee   [facility] [place], Megan P, DO   3 months ago Acute pain of left knee   [facility] Kathrine Haddock, NP   4 months ago Acute anxiety   [place], DO   4 months ago Vertigo   [place], [place], DO      Future Appointments            In 4 months Wynetta Emery, Barb Merino, DO [facility], [place]           . rosuvastatin (CRESTOR) 40 MG tablet [Pharmacy Med Name: Rosuvastatin Calcium 40 MG Oral Tablet] 90 tablet 1    Sig: TAKE 1 TABLET BY MOUTH DAILY     Cardiovascular:  Antilipid - Statins 2 Failed - 02/24/2022 12:36 AM      Failed - Cr in normal range and within 360 days    Creatinine, Ser  Date Value Ref Range Status  12/22/2021 1.44 (H) 0.76 - 1.27 mg/dL Final         Failed - Lipid Panel in normal range within the last 12 months    Cholesterol, Total  Date Value Ref Range Status  12/22/2021 111 100 - 199 mg/dL Final   Cholesterol Piccolo, Waived  Date Value Ref Range Status  05/14/2019 98 <200 mg/dL Final    Comment:                            Desirable                <200                          Borderline High      200- 239                         High                     >239    LDL Chol Calc (NIH)  Date Value Ref Range Status  12/22/2021 53 0 - 99 mg/dL Final   HDL  Date Value Ref Range Status  12/22/2021 40 >39 mg/dL Final   Triglycerides  Date Value Ref Range Status  12/22/2021 92 0 - 149 mg/dL Final   Triglycerides Piccolo,Waived  Date Value Ref Range Status  05/14/2019 205 (H) <150 mg/dL Final    Comment:                            Normal                   <150                         Borderline High     150 - 199                         High  200 - 55                         Very High                >499          Passed - Patient is not pregnant      Passed - Valid encounter within last 12 months    Recent Outpatient Visits          2 months ago Arthritis of left knee   [place], Megan P, DO   2 months ago Acute pain of left knee   [facility] [place], Megan P, DO   3 months ago Acute pain of left knee   [facility] Kathrine Haddock, NP   4 months ago Acute anxiety   [place], [place], DO   4 months ago Vertigo   [place], [place], DO      Future Appointments            In 4 months Wynetta Emery, Barb Merino, DO [facility], [facility]

## 2022-02-24 NOTE — Telephone Encounter (Signed)
Requested medication (s) are due for refill today: no  Requested medication (s) are on the active medication list: yes  Last refill:  coreg- 02/24/22 #180 1 refill, crestor and plavix- 02/24/22 #90 0 refills  Future visit scheduled: yes in 4 months  Notes to clinic:  requesting 1 year supply do you want to allow for 1 year refills?     Requested Prescriptions  Pending Prescriptions Disp Refills   carvedilol (COREG) 12.5 MG tablet [Pharmacy Med Name: Carvedilol 12.5 MG Oral Tablet] 180 tablet 3    Sig: TAKE 1 TABLET BY MOUTH TWICE  DAILY WITH MEALS     Cardiovascular: Beta Blockers 3 Failed - 02/24/2022 11:31 AM      Failed - Cr in normal range and within 360 days    Creatinine, Ser  Date Value Ref Range Status  12/22/2021 1.44 (H) 0.76 - 1.27 mg/dL Final         Passed - AST in normal range and within 360 days    AST  Date Value Ref Range Status  12/22/2021 21 0 - 40 IU/L Final   AST (SGOT) Piccolo, Waived  Date Value Ref Range Status  05/14/2019 33 11 - 38 U/L Final         Passed - ALT in normal range and within 360 days    ALT  Date Value Ref Range Status  12/22/2021 14 0 - 44 IU/L Final   ALT (SGPT) Piccolo, Waived  Date Value Ref Range Status  05/14/2019 22 10 - 47 U/L Final         Passed - Last BP in normal range    BP Readings from Last 1 Encounters:  12/22/21 123/64         Passed - Last Heart Rate in normal range    Pulse Readings from Last 1 Encounters:  12/22/21 (!) 56         Passed - Valid encounter within last 6 months    Recent Outpatient Visits           2 months ago Arthritis of left knee   [facility] [place], Megan P, DO   2 months ago Acute pain of left knee   [facility] [place], Megan P, DO   3 months ago Acute pain of left knee   [facility] Kathrine Haddock, NP   4 months ago Acute anxiety   [facility] [place], [place], DO   4 months ago Vertigo   [place], [place], DO       Future Appointments             In 4 months Johnson, Megan P, DO [place], [facility]             rosuvastatin (CRESTOR) 40 MG tablet [employer] Med Name: Rosuvastatin Calcium 40 MG Oral Tablet] 90 tablet 3    Sig: TAKE 1 TABLET BY MOUTH DAILY     Cardiovascular:  Antilipid - Statins 2 Failed - 02/24/2022 11:31 AM      Failed - Cr in normal range and within 360 days    Creatinine, Ser  Date Value Ref Range Status  12/22/2021 1.44 (H) 0.76 - 1.27 mg/dL Final         Failed - Lipid Panel in normal range within the last 12 months    Cholesterol, Total  Date Value Ref Range Status  12/22/2021 111 100 - 199 mg/dL Final   Cholesterol  [place], [place]  Date Value Ref Range Status  05/14/2019 98 <200 mg/dL Final    Comment:                            Desirable                <200                         Borderline High      200- 239                         High                     >239    LDL Chol Calc (NIH)  Date Value Ref Range Status  12/22/2021 53 0 - 99 mg/dL Final   HDL  Date Value Ref Range Status  12/22/2021 40 >39 mg/dL Final   Triglycerides  Date Value Ref Range Status  12/22/2021 92 0 - 149 mg/dL Final   Triglycerides Piccolo,Waived  Date Value Ref Range Status  05/14/2019 205 (H) <150 mg/dL Final    Comment:                            Normal                   <150                         Borderline High     150 - 199                         High                200 - 499                         Very High                >499          Passed - Patient is not pregnant      Passed - Valid encounter within last 12 months    Recent Outpatient Visits           2 months ago Arthritis of left knee   [facility] [place], Megan P, DO   2 months ago Acute pain of left knee   [facility] [place], Megan P, DO   3 months ago Acute pain of left knee   [facility] Kathrine Haddock, NP    4 months ago Acute anxiety   [place], [place], DO   4 months ago Vertigo   [place], DO       Future Appointments             In 4 months Johnson, Megan P, [place], [facility]             clopidogrel (PLAVIX) 75 MG tablet [Pharmacy Med Name: Clopidogrel Bisulfate 75 MG Oral Tablet] 90 tablet 3    Sig: TAKE 1 TABLET BY MOUTH DAILY     Hematology: Antiplatelets - clopidogrel Failed - 02/24/2022 11:31 AM      Failed - Cr  in normal range and within 360 days    Creatinine, Ser  Date Value Ref Range Status  12/22/2021 1.44 (H) 0.76 - 1.27 mg/dL Final         Passed - HCT in normal range and within 180 days    Hematocrit  Date Value Ref Range Status  12/22/2021 45.4 37.5 - 51.0 % Final         Passed - HGB in normal range and within 180 days    Hemoglobin  Date Value Ref Range Status  12/22/2021 15.2 13.0 - 17.7 g/dL Final         Passed - PLT in normal range and within 180 days    Platelets  Date Value Ref Range Status  12/22/2021 206 150 - 450 x10E3/uL Final         Passed - Valid encounter within last 6 months    Recent Outpatient Visits           2 months ago Arthritis of left knee   [place], Megan P, DO   2 months ago Acute pain of left knee   [facility] [place], Megan P, DO   3 months ago Acute pain of left knee   [facility] Kathrine Haddock, NP   4 months ago Acute anxiety   [place], [place], DO   4 months ago Vertigo   [place], [place], DO       Future Appointments             In 4 months Wynetta Emery, Barb Merino, DO [employer], [facility]

## 2022-03-01 ENCOUNTER — Telehealth: Payer: Self-pay

## 2022-03-01 ENCOUNTER — Ambulatory Visit (INDEPENDENT_AMBULATORY_CARE_PROVIDER_SITE_OTHER): Payer: Medicare Other

## 2022-03-01 ENCOUNTER — Telehealth: Payer: Medicare Other

## 2022-03-01 DIAGNOSIS — I10 Essential (primary) hypertension: Secondary | ICD-10-CM

## 2022-03-01 DIAGNOSIS — M1712 Unilateral primary osteoarthritis, left knee: Secondary | ICD-10-CM

## 2022-03-01 DIAGNOSIS — I2510 Atherosclerotic heart disease of native coronary artery without angina pectoris: Secondary | ICD-10-CM

## 2022-03-01 NOTE — Chronic Care Management (AMB) (Signed)
Chronic Care Management   CCM RN Visit Note  03/01/2022 Name: Ricky Melton MRN: [id number] DOB: [birth year]  Subjective: Ricky Melton is a 71 y.o. year old male who is a primary care patient of Valerie Roys, DO. The care management team was consulted for assistance with disease management and care coordination needs.    Engaged with patient by telephone for follow up visit in response to provider referral for case management and/or care coordination services.   Consent to Services:  The patient was given information about Chronic Care Management services, agreed to services, and gave verbal consent prior to initiation of services.  Please see initial visit note for detailed documentation.   Patient agreed to services and verbal consent obtained.   Assessment: Review of patient past medical history, allergies, medications, health status, including review of consultants reports, laboratory and other test data, was performed as part of comprehensive evaluation and provision of chronic care management services.   SDOH (Social Determinants of Health) assessments and interventions performed:    CCM Care Plan  Allergies  Allergen Reactions   Hydrochlorothiazide Other (See Comments)    dizziness    Outpatient Encounter Medications as of 03/01/2022  Medication Sig   allopurinol (ZYLOPRIM) 300 MG tablet Take 1 tablet (300 mg total) by mouth daily.   amLODipine (NORVASC) 10 MG tablet Take 1 tablet (10 mg total) by mouth every evening.   aspirin 81 MG tablet Take 81 mg by mouth daily.   benazepril (LOTENSIN) 20 MG tablet Take 1 tablet (20 mg total) by mouth daily.   carvedilol (COREG) 12.5 MG tablet TAKE 1 TABLET BY MOUTH TWICE  DAILY WITH MEALS   clopidogrel (PLAVIX) 75 MG tablet TAKE 1 TABLET BY MOUTH DAILY   diclofenac Sodium (VOLTAREN) 1 % GEL Apply 4 g topically 4 (four) times daily.   fluticasone (FLONASE) 50 MCG/ACT nasal spray Place 2 sprays into both nostrils daily.    levothyroxine (SYNTHROID) 50 MCG tablet TAKE 1 TABLET BY MOUTH  DAILY   meclizine (ANTIVERT) 25 MG tablet Take 1 tablet (25 mg total) by mouth 3 (three) times daily as needed for dizziness.   rosuvastatin (CRESTOR) 40 MG tablet TAKE 1 TABLET BY MOUTH DAILY   No facility-administered encounter medications on file as of 03/01/2022.    Patient Active Problem List   Diagnosis Date Noted   Arthritis of left knee 12/22/2021   Essential hypertension 09/16/2021   Prediabetes 07/11/2021   Advanced care planning/counseling discussion 10/25/2017   Allergy 07/12/2017   Morbid obesity (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   CAD (coronary artery disease) 03/23/2015   Benign hematuria 03/23/2015   Hyperlipidemia 03/23/2015   Hypothyroidism 03/23/2015   Gout 03/23/2015    Conditions to be addressed/monitored:CAD, HTN, HLD, and Chronic Pain   Care Plan : RNCM: General Plan of Care (Adult) for Chronic Disease Management and Care Coordination Needs  Updates made by Vanita Ingles, RN since 03/01/2022 12:00 AM  Completed 03/01/2022   Problem: RNCM: Development of Plan of Care for Chronic Disease Management (CAD, HLD, HTN) Resolved 03/01/2022  Priority: High     Long-Range Goal: RNCM: Development of Plan of Care for Chronic Disease Management (CAD, HLD, HTN) Completed 03/01/2022  Start Date: 08/26/2021  Expected End Date: 08/26/2022  Priority: High  Note:   Current Barriers: 03-01-2022: Goals met and care plan is being closed  Knowledge Deficits related to plan of  care for management of CAD, HTN, and HLD  Chronic Disease Management support and education needs related to CAD, HTN, and HLD  RNCM Clinical Goal(s):  Patient will verbalize basic understanding of CAD, HTN, and HLD disease process and self health management plan as evidenced by keeping appointments, following the plan of care and working with the pcp and CCM  team to optimize plan of care for health and well being  demonstrate understanding of rationale for each prescribed medication as evidenced by compliance and calling for refills before running out of medications     attend all scheduled medical appointments: 07-13-2022 at 8 am as evidenced by keeping appointments and calling the office for needed scheduled changes         demonstrate improved and ongoing health management independence as evidenced by stable conditions, stable VS and not acute exacerbations of conditions         demonstrate a decrease in CAD, HTN, and HLD exacerbations  as evidenced by managed chronic conditions and effective management of chronic conditions  demonstrate ongoing self health care management ability effectively manage chronic conditions  as evidenced by   working with the CCM team through collaboration with Consulting civil engineer, provider, and care team.   Interventions: 1:1 collaboration with primary care provider regarding development and update of comprehensive plan of care as evidenced by provider attestation and co-signature Inter-disciplinary care team collaboration (see longitudinal plan of care) Evaluation of current treatment plan related to  self management and patient's adherence to plan as established by provider   CAD  (Status: Goal Met.) Long Term Goal  03-01-2022: Goals met and care plan is being closed  BP Readings from Last 3 Encounters:  12/22/21 123/64  12/01/21 136/72  11/08/21 138/68    Lab Results  Component Value Date   CHOL 111 12/22/2021   HDL 40 12/22/2021   LDLCALC 53 12/22/2021   TRIG 92 12/22/2021   CHOLHDL 2.9 05/24/2021    Assessed understanding of CAD diagnosis. 03-01-2022: The patient states that he understands his chronic conditions and is compliant with the plan of care.  Medications reviewed including medications utilized in CAD treatment plan. 03-01-2022: The patient is compliant with medications and treatment plan of CAD Provided  education on importance of blood pressure control in management of CAD; Provided education on Importance of limiting foods high in cholesterol. 03-01-2022: Review and education provided Counseled on importance of regular laboratory monitoring as prescribed. 03-01-2022: The patient has regular lab work; Counseled on the importance of exercise goals with target of 150 minutes per week Reviewed Importance of taking all medications as prescribed. 03-01-2022: The patient endorses compliance with medications Reviewed Importance of attending all scheduled provider appointments. 07-13-2022 at 0800 am Advised to report any changes in symptoms or exercise tolerance. 12-09-2021: The patient played golf today and is active. Denies any acute findings today.  Screening for signs and symptoms of depression related to chronic disease state;  Assessed social determinant of health barriers;   Hyperlipidemia:  (Status: Goal Met.) Long Term Goal  03-01-2022: Goals met and care plan is being closed  Lab Results  Component Value Date   CHOL 111 12/22/2021   HDL 40 12/22/2021   LDLCALC 53 12/22/2021   TRIG 92 12/22/2021   CHOLHDL 2.9 05/24/2021     Medication review performed; medication list updated in electronic medical record. 03-01-2022: The patient is compliant with Crestor 40 mg QD Provider established cholesterol goals reviewed; Counseled on importance of regular laboratory monitoring  as prescribed. 03-01-2022: Education and review of regular lab testing; Provided HLD educational materials; Reviewed role and benefits of statin for ASCVD risk reduction; Discussed strategies to manage statin-induced myalgias; Reviewed importance of limiting foods high in cholesterol. 03-01-2022: The patient is mindful of dietary restrictions and follows the plan of care; Reviewed exercise goals and target of 150 minutes per week;  Hypertension: (Status: Goal Met.) 03-01-2022: Goals met and care plan is being closed  Last practice  recorded BP readings:  BP Readings from Last 3 Encounters:  12/22/21 123/64  12/01/21 136/72  11/08/21 138/68  Most recent eGFR/CrCl:  Lab Results  Component Value Date   EGFR 52 (L) 12/22/2021    No components found for: CRCL  Evaluation of current treatment plan related to hypertension self management and patient's adherence to plan as established by provider. 09-19-2021: The patient states that his blood pressures were likely up because he was not feeling the best on Friday. He was calling today to see about test results and let the RNCM know he was feeling better. Will continue to monitor for changes and new needs.   10-28-2021: The patients blood pressures are stabilizing out. He has seen the pcp several times recently. He is a little anxious right now as his elderly mother, who is 43 is likely transitioning. He and his sisters are with her around the clock. The patient states he is doing the best that he can right now. Empathetic listening and support given. Will continue to monitor for changes or new needs. 12-09-2021: The patient states that his blood sugars have been more stable. The patient states he is writing down the values and taking them with him to his provider visits. The patient states that his mother passed away on 2021-11-10. Condolences expressed. The patient states he is doing pretty good except for some knee pain. 03-01-2022: The patients blood pressures are stable and he denies any new concerns. Goals of care met. Provided education to patient re: stroke prevention, s/s of heart attack and stroke; Reviewed prescribed diet heart healthy diet. 10-28-2021: Review of taking care of self during this time when the patients mother is not doing well and also due to the patients chronic conditions. 12-09-2021: The patient reflected on the loss of his mother. He is doing well since she passed away. Reflective listening and support given.  Reviewed medications with patient and discussed importance  of compliance. 12-09-2021: Review and the patient is compliant with medications  Discussed plans with patient for ongoing care management follow up and provided patient with direct contact information for care management team; Advised patient, providing education and rationale, to monitor blood pressure daily and record, calling PCP for findings outside established parameters;  Advised patient to discuss blood pressure trends with provider; Provided education on prescribed diet heart healthy ;  Discussed complications of poorly controlled blood pressure such as heart disease, stroke, circulatory complications, vision complications, kidney impairment, sexual dysfunction;    Acute cough and respiratory sx and sx   (Status: Goal Met.) Short Term Goal  Evaluation of current treatment plan related to  acute cough and respiratory sx and sx ,  self-management and patient's adherence to plan as established by provider. 09-19-2021: Seen in the office on 09-16-2021. The patient placed an incoming call to the [facility] inquiring about his COVID testing from 09-16-2021. He states he is feeling better but had not received information from the office on the results of his COVID test. Review of results given to the  patient. COVID testing from the chart is negative. In-basket message sent to pcp and clinical staff letting them know the patient had been given the results and that he is feeling better. The patient states he will follow up with the pcp in 2 weeks.  Discussed plans with patient for ongoing care management follow up and provided patient with direct contact information for care management team Advised patient to call the office for new sx and sx of infection, questions, or concerns; Provided education to patient re: practicing good hygiene, wearing a mask when out in public, and following the recommendations of the provider.  The patient states his wife has been sick also and she had to go to her pcp and get medications  as well. Education on the many respiratory and viral illnesses going on currently in the area. The patient will call for changes or needs. ; Discussed plans with patient for ongoing care management follow up and provided patient with direct contact information for care management team;    Pain:  (Status: Goal Met.) Long Term Goal  Pain assessment performed. 12-09-2021: The patient states his left knee pain is at a 3/4 today and not bad. He was able to play golf today without difficulty. The patient states that he is using the cream she prescribed and that is helping a lot. 03-01-2022: The patient denies any new issues with pain and discomfort at this time. The patient got an injection in his knee in April and has no further issues with pain. Medications reviewed. 12-09-2021: The patient takes Tylenol when needed for pain relief.  Reviewed provider established plan for pain management. 12-09-2021: Follows the recommendations of the provider. ; Discussed importance of adherence to all scheduled medical appointments. 12-09-2021: Reminder given of follow up appointment with the pcp on 12-22-2021 at 0900 am Counseled on the importance of reporting any/all new or changed pain symptoms or management strategies to pain management provider; Advised patient to report to care team affect of pain on daily activities; Discussed use of relaxation techniques and/or diversional activities to assist with pain reduction (distraction, imagery, relaxation, massage, acupressure, TENS, heat, and cold application; Reviewed with patient prescribed pharmacological and nonpharmacological pain relief strategies; Advised patient to discuss unresolved pain, changes in level or intensity of pain with provider;   Patient Goals/Self-Care Activities: Take medications as prescribed   Attend all scheduled provider appointments Call pharmacy for medication refills 3-7 days in advance of running out of medications Attend church or other  social activities Perform all self care activities independently  Perform IADL's (shopping, preparing meals, housekeeping, managing finances) independently Call provider office for new concerns or questions  Work with the social worker to address care coordination needs and will continue to work with the clinical team to address health care and disease management related needs call the Suicide and Crisis Lifeline: 988 call the [place] National Suicide Prevention Lifeline: 843 056 6773 or TTY: 2255825551 TTY 217-727-1488) to talk to a trained counselor call 1-800-273-TALK (toll free, 24 hour hotline) if experiencing a Mental Health or [place]  check blood pressure weekly choose a place to take my blood pressure (home, clinic or office, retail store) write blood pressure results in a log or diary learn about high blood pressure keep a blood pressure log take blood pressure log to all doctor appointments call doctor for signs and symptoms of high blood pressure develop an action plan for high blood pressure keep all doctor appointments take medications for blood pressure exactly as  prescribed report new symptoms to your doctor eat more whole grains, fruits and vegetables, lean meats and healthy fats - call for medicine refill 2 or 3 days before it runs out - take all medications exactly as prescribed - call doctor with any symptoms you believe are related to your medicine - call doctor when you experience any new symptoms - go to all doctor appointments as scheduled - adhere to prescribed diet: heart healthy       Plan:No further follow up required: the patients has met goals of care and the care. The patient knows the number to reach the [facility] and knows to call for changes or needs  Noreene Larsson RN, MSN, [facility] Mobile: [phone]

## 2022-03-01 NOTE — Patient Instructions (Signed)
Visit Information  Thank you for taking time to visit with me today. Please don't hesitate to contact me if I can be of assistance to you before our next scheduled telephone appointment.  Following are the goals we discussed today:  Conditions to be addressed/monitored:CAD, HTN, HLD, and Chronic Pain    Care Plan : RNCM: General Plan of Care (Adult) for Chronic Disease Management and Care Coordination Needs  Updates made by Vanita Ingles, RN since 03/01/2022 12:00 AM  Completed 03/01/2022    Problem: RNCM: Development of Plan of Care for Chronic Disease Management (CAD, HLD, HTN) Resolved 03/01/2022  Priority: High       Long-Range Goal: RNCM: Development of Plan of Care for Chronic Disease Management (CAD, HLD, HTN) Completed 03/01/2022  Start Date: 08/26/2021  Expected End Date: 08/26/2022  Priority: High  Note:   Current Barriers: 03-01-2022: Goals met and care plan is being closed  Knowledge Deficits related to plan of care for management of CAD, HTN, and HLD  Chronic Disease Management support and education needs related to CAD, HTN, and HLD   RNCM Clinical Goal(s):  Patient will verbalize basic understanding of CAD, HTN, and HLD disease process and self health management plan as evidenced by keeping appointments, following the plan of care and working with the pcp and CCM team to optimize plan of care for health and well being  demonstrate understanding of rationale for each prescribed medication as evidenced by compliance and calling for refills before running out of medications     attend all scheduled medical appointments: 07-13-2022 at 8 am as evidenced by keeping appointments and calling the office for needed scheduled changes         demonstrate improved and ongoing health management independence as evidenced by stable conditions, stable VS and not acute exacerbations of conditions         demonstrate a decrease in CAD, HTN, and HLD exacerbations  as evidenced by managed chronic  conditions and effective management of chronic conditions  demonstrate ongoing self health care management ability effectively manage chronic conditions  as evidenced by   working with the CCM team through collaboration with Consulting civil engineer, provider, and care team.    Interventions: 1:1 collaboration with primary care provider regarding development and update of comprehensive plan of care as evidenced by provider attestation and co-signature Inter-disciplinary care team collaboration (see longitudinal plan of care) Evaluation of current treatment plan related to  self management and patient's adherence to plan as established by provider     CAD  (Status: Goal Met.) Long Term Goal  03-01-2022: Goals met and care plan is being closed     BP Readings from Last 3 Encounters:  12/22/21 123/64  12/01/21 136/72  11/08/21 138/68         Lab Results  Component Value Date    CHOL 111 12/22/2021    HDL 40 12/22/2021    LDLCALC 53 12/22/2021    TRIG 92 12/22/2021    CHOLHDL 2.9 05/24/2021    Assessed understanding of CAD diagnosis. 03-01-2022: The patient states that he understands his chronic conditions and is compliant with the plan of care.  Medications reviewed including medications utilized in CAD treatment plan. 03-01-2022: The patient is compliant with medications and treatment plan of CAD Provided education on importance of blood pressure control in management of CAD; Provided education on Importance of limiting foods high in cholesterol. 03-01-2022: Review and education provided Counseled on importance of regular laboratory monitoring as  prescribed. 03-01-2022: The patient has regular lab work; Counseled on the importance of exercise goals with target of 150 minutes per week Reviewed Importance of taking all medications as prescribed. 03-01-2022: The patient endorses compliance with medications Reviewed Importance of attending all scheduled provider appointments. 07-13-2022 at 0800 am Advised  to report any changes in symptoms or exercise tolerance. 12-09-2021: The patient played golf today and is active. Denies any acute findings today.  Screening for signs and symptoms of depression related to chronic disease state;  Assessed social determinant of health barriers;    Hyperlipidemia:  (Status: Goal Met.) Long Term Goal  03-01-2022: Goals met and care plan is being closed       Lab Results  Component Value Date    CHOL 111 12/22/2021    HDL 40 12/22/2021    LDLCALC 53 12/22/2021    TRIG 92 12/22/2021    CHOLHDL 2.9 05/24/2021      Medication review performed; medication list updated in electronic medical record. 03-01-2022: The patient is compliant with Crestor 40 mg QD Provider established cholesterol goals reviewed; Counseled on importance of regular laboratory monitoring as prescribed. 03-01-2022: Education and review of regular lab testing; Provided HLD educational materials; Reviewed role and benefits of statin for ASCVD risk reduction; Discussed strategies to manage statin-induced myalgias; Reviewed importance of limiting foods high in cholesterol. 03-01-2022: The patient is mindful of dietary restrictions and follows the plan of care; Reviewed exercise goals and target of 150 minutes per week;   Hypertension: (Status: Goal Met.) 03-01-2022: Goals met and care plan is being closed  Last practice recorded BP readings:     BP Readings from Last 3 Encounters:  12/22/21 123/64  12/01/21 136/72  11/08/21 138/68  Most recent eGFR/CrCl:       Lab Results  Component Value Date    EGFR 52 (L) 12/22/2021    No components found for: CRCL   Evaluation of current treatment plan related to hypertension self management and patient's adherence to plan as established by provider. 09-19-2021: The patient states that his blood pressures were likely up because he was not feeling the best on Friday. He was calling today to see about test results and let the RNCM know he was feeling better.  Will continue to monitor for changes and new needs.   10-28-2021: The patients blood pressures are stabilizing out. He has seen the pcp several times recently. He is a little anxious right now as his elderly mother, who is 4 is likely transitioning. He and his sisters are with her around the clock. The patient states he is doing the best that he can right now. Empathetic listening and support given. Will continue to monitor for changes or new needs. 12-09-2021: The patient states that his blood sugars have been more stable. The patient states he is writing down the values and taking them with him to his provider visits. The patient states that his mother passed away on 2021-11-17. Condolences expressed. The patient states he is doing pretty good except for some knee pain. 03-01-2022: The patients blood pressures are stable and he denies any new concerns. Goals of care met. Provided education to patient re: stroke prevention, s/s of heart attack and stroke; Reviewed prescribed diet heart healthy diet. 10-28-2021: Review of taking care of self during this time when the patients mother is not doing well and also due to the patients chronic conditions. 12-09-2021: The patient reflected on the loss of his mother. He is doing well since  she passed away. Reflective listening and support given.  Reviewed medications with patient and discussed importance of compliance. 12-09-2021: Review and the patient is compliant with medications  Discussed plans with patient for ongoing care management follow up and provided patient with direct contact information for care management team; Advised patient, providing education and rationale, to monitor blood pressure daily and record, calling PCP for findings outside established parameters;  Advised patient to discuss blood pressure trends with provider; Provided education on prescribed diet heart healthy ;  Discussed complications of poorly controlled blood pressure such as heart  disease, stroke, circulatory complications, vision complications, kidney impairment, sexual dysfunction;      Acute cough and respiratory sx and sx   (Status: Goal Met.) Short Term Goal  Evaluation of current treatment plan related to  acute cough and respiratory sx and sx ,  self-management and patient's adherence to plan as established by provider. 09-19-2021: Seen in the office on 09-16-2021. The patient placed an incoming call to the [facility] inquiring about his COVID testing from 09-16-2021. He states he is feeling better but had not received information from the office on the results of his COVID test. Review of results given to the patient. COVID testing from the chart is negative. In-basket message sent to pcp and clinical staff letting them know the patient had been given the results and that he is feeling better. The patient states he will follow up with the pcp in 2 weeks.  Discussed plans with patient for ongoing care management follow up and provided patient with direct contact information for care management team Advised patient to call the office for new sx and sx of infection, questions, or concerns; Provided education to patient re: practicing good hygiene, wearing a mask when out in public, and following the recommendations of the provider.  The patient states his wife has been sick also and she had to go to her pcp and get medications as well. Education on the many respiratory and viral illnesses going on currently in the area. The patient will call for changes or needs. ; Discussed plans with patient for ongoing care management follow up and provided patient with direct contact information for care management team;      Pain:  (Status: Goal Met.) Long Term Goal  Pain assessment performed. 12-09-2021: The patient states his left knee pain is at a 3/4 today and not bad. He was able to play golf today without difficulty. The patient states that he is using the cream she prescribed and that is  helping a lot. 03-01-2022: The patient denies any new issues with pain and discomfort at this time. The patient got an injection in his knee in April and has no further issues with pain. Medications reviewed. 12-09-2021: The patient takes Tylenol when needed for pain relief.  Reviewed provider established plan for pain management. 12-09-2021: Follows the recommendations of the provider. ; Discussed importance of adherence to all scheduled medical appointments. 12-09-2021: Reminder given of follow up appointment with the pcp on 12-22-2021 at 0900 am Counseled on the importance of reporting any/all new or changed pain symptoms or management strategies to pain management provider; Advised patient to report to care team affect of pain on daily activities; Discussed use of relaxation techniques and/or diversional activities to assist with pain reduction (distraction, imagery, relaxation, massage, acupressure, TENS, heat, and cold application; Reviewed with patient prescribed pharmacological and nonpharmacological pain relief strategies; Advised patient to discuss unresolved pain, changes in level or intensity of pain  with provider;    Patient Goals/Self-Care Activities: Take medications as prescribed   Attend all scheduled provider appointments Call pharmacy for medication refills 3-7 days in advance of running out of medications Attend church or other social activities Perform all self care activities independently  Perform IADL's (shopping, preparing meals, housekeeping, managing finances) independently Call provider office for new concerns or questions  Work with the social worker to address care coordination needs and will continue to work with the clinical team to address health care and disease management related needs call the Suicide and Crisis Lifeline: 988 call the [place] National Suicide Prevention Lifeline: 9030861169 or TTY: 9073958574 TTY 754-692-9851) to talk to a trained  counselor call 1-800-273-TALK (toll free, 24 hour hotline) if experiencing a Mental Health or [place]  check blood pressure weekly choose a place to take my blood pressure (home, clinic or office, retail store) write blood pressure results in a log or diary learn about high blood pressure keep a blood pressure log take blood pressure log to all doctor appointments call doctor for signs and symptoms of high blood pressure develop an action plan for high blood pressure keep all doctor appointments take medications for blood pressure exactly as prescribed report new symptoms to your doctor eat more whole grains, fruits and vegetables, lean meats and healthy fats - call for medicine refill 2 or 3 days before it runs out - take all medications exactly as prescribed - call doctor with any symptoms you believe are related to your medicine - call doctor when you experience any new symptoms - go to all doctor appointments as scheduled - adhere to prescribed diet: heart healthy      No further outreach needed at this time. The patient knows to call the [facility] for changes or new needs  Please call the care guide team at [phone] if you need to cancel or reschedule your appointment.   If you are experiencing a Mental Health or [place] or need someone to talk to, please call the Suicide and Crisis Lifeline: 988 call the [place] National Suicide Prevention Lifeline: 813-879-0390 or TTY: 202-016-3749 TTY (424)295-1932) to talk to a trained counselor call 1-800-273-TALK (toll free, 24 hour hotline)   The patient verbalized understanding of instructions, educational materials, and care plan provided today and DECLINED offer to receive copy of patient instructions, educational materials, and care plan.   Noreene Larsson RN, MSN, [facility] Mobile: [phone]

## 2022-03-01 NOTE — Telephone Encounter (Signed)
  Care Management   Follow Up Note   03/01/2022 Name: Ricky Melton MRN: [id number] DOB: [birth year]   Referred by: Valerie Roys, DO Reason for referral : Chronic Care Management (RNCM: Follow up for Chronic Disease Management and Care Coordination Needs )   An unsuccessful telephone outreach was attempted today. The patient was referred to the case management team for assistance with care management and care coordination.   Follow Up Plan: No further follow up required: the patient has met the goals of care and no further follow up is needed.   Noreene Larsson RN, MSN, [facility] Mobile: [phone]

## 2022-03-23 DIAGNOSIS — N281 Cyst of kidney, acquired: Secondary | ICD-10-CM | POA: Diagnosis not present

## 2022-03-23 DIAGNOSIS — R809 Proteinuria, unspecified: Secondary | ICD-10-CM | POA: Diagnosis not present

## 2022-03-23 DIAGNOSIS — N1832 Chronic kidney disease, stage 3b: Secondary | ICD-10-CM | POA: Diagnosis not present

## 2022-03-23 DIAGNOSIS — M1000 Idiopathic gout, unspecified site: Secondary | ICD-10-CM | POA: Diagnosis not present

## 2022-03-23 DIAGNOSIS — I10 Essential (primary) hypertension: Secondary | ICD-10-CM | POA: Diagnosis not present

## 2022-03-30 ENCOUNTER — Other Ambulatory Visit: Payer: Self-pay | Admitting: Family Medicine

## 2022-03-30 DIAGNOSIS — I10 Essential (primary) hypertension: Secondary | ICD-10-CM

## 2022-03-30 DIAGNOSIS — N281 Cyst of kidney, acquired: Secondary | ICD-10-CM | POA: Diagnosis not present

## 2022-03-30 DIAGNOSIS — N1832 Chronic kidney disease, stage 3b: Secondary | ICD-10-CM | POA: Diagnosis not present

## 2022-03-30 DIAGNOSIS — R7303 Prediabetes: Secondary | ICD-10-CM | POA: Diagnosis not present

## 2022-03-30 DIAGNOSIS — R809 Proteinuria, unspecified: Secondary | ICD-10-CM | POA: Diagnosis not present

## 2022-03-30 DIAGNOSIS — M1000 Idiopathic gout, unspecified site: Secondary | ICD-10-CM | POA: Diagnosis not present

## 2022-03-30 NOTE — Telephone Encounter (Signed)
Requested Prescriptions  Pending Prescriptions Disp Refills  . benazepril (LOTENSIN) 20 MG tablet [Pharmacy Med Name: Benazepril HCl 20 MG Oral Tablet] 90 tablet 1    Sig: TAKE 1 TABLET BY MOUTH DAILY     Cardiovascular:  ACE Inhibitors Failed - 03/30/2022  5:50 AM      Failed - Cr in normal range and within 180 days    Creatinine, Ser  Date Value Ref Range Status  12/22/2021 1.44 (H) 0.76 - 1.27 mg/dL Final         Passed - K in normal range and within 180 days    Potassium  Date Value Ref Range Status  12/22/2021 5.0 3.5 - 5.2 mmol/L Final         Passed - Patient is not pregnant      Passed - Last BP in normal range    BP Readings from Last 1 Encounters:  12/22/21 123/64         Passed - Valid encounter within last 6 months    Recent Outpatient Visits          3 months ago Arthritis of left knee   [place], Megan P, DO   3 months ago Acute pain of left knee   [place], Megan P, DO   4 months ago Acute pain of left knee   [facility] Kathrine Haddock, NP   5 months ago Acute anxiety   [place], [place], DO   5 months ago Vertigo   [place], [place], DO      Future Appointments            In 3 months Johnson, Barb Merino, DO [employer]

## 2022-04-19 ENCOUNTER — Encounter: Payer: Self-pay | Admitting: Physician Assistant

## 2022-04-19 ENCOUNTER — Ambulatory Visit (INDEPENDENT_AMBULATORY_CARE_PROVIDER_SITE_OTHER): Payer: Medicare Other | Admitting: Physician Assistant

## 2022-04-19 VITALS — BP 132/64 | HR 69 | Temp 98.4°F | Ht 67.01 in | Wt 231.2 lb

## 2022-04-19 DIAGNOSIS — L02214 Cutaneous abscess of groin: Secondary | ICD-10-CM | POA: Diagnosis not present

## 2022-04-19 MED ORDER — SULFAMETHOXAZOLE-TRIMETHOPRIM 800-160 MG PO TABS: 1.0000 | ORAL_TABLET | Freq: Two times a day (BID) | ORAL | 0 refills | Status: AC

## 2022-04-19 NOTE — Progress Notes (Signed)
Acute Office Visit   Patient: Ricky Melton   DOB: 08-Dec-1950   71 y.o. Male  MRN: [id number] Visit Date: 04/19/2022  Today's healthcare provider: Dani Gobble Jakobi Thetford, PA-C  Introduced myself to the patient as a Journalist, newspaper and provided education on APPs in clinical practice.    Chief Complaint  Patient presents with  . Cyst    On right leg next to privates   Subjective    HPI HPI     Cyst    Additional comments: On right leg next to privates      Last edited by Jerelene Redden, CMA on 04/19/2022 11:16 AM.       Cyst Location: right inner thigh/ groin area Reports pain with manipulation Onset: sudden Duration: Noticed it on Sunday/ Monday  Alleviating: Aggravating: Walking and touching the area. Interventions: Has not tried to manipulate area. He has put bug bite cream on the area for relief  Denies swelling or redness that he is aware of.     Medications: Outpatient Medications Prior to Visit  Medication Sig  . allopurinol (ZYLOPRIM) 300 MG tablet Take 1 tablet (300 mg total) by mouth daily.  Marland Kitchen amLODipine (NORVASC) 10 MG tablet Take 1 tablet (10 mg total) by mouth every evening.  Marland Kitchen aspirin 81 MG tablet Take 81 mg by mouth daily.  . benazepril (LOTENSIN) 20 MG tablet TAKE 1 TABLET BY MOUTH DAILY  . carvedilol (COREG) 12.5 MG tablet TAKE 1 TABLET BY MOUTH TWICE  DAILY WITH MEALS  . clopidogrel (PLAVIX) 75 MG tablet TAKE 1 TABLET BY MOUTH DAILY  . diclofenac Sodium (VOLTAREN) 1 % GEL Apply 4 g topically 4 (four) times daily.  . fexofenadine (ALLEGRA) 180 MG tablet Take by mouth.  . fluticasone (FLONASE) 50 MCG/ACT nasal spray Place 2 sprays into both nostrils daily.  Marland Kitchen levothyroxine (SYNTHROID) 50 MCG tablet TAKE 1 TABLET BY MOUTH  DAILY  . meclizine (ANTIVERT) 25 MG tablet Take 1 tablet (25 mg total) by mouth 3 (three) times daily as needed for dizziness.  . rosuvastatin (CRESTOR) 40 MG tablet TAKE 1 TABLET BY MOUTH DAILY  . hydrochlorothiazide (HYDRODIURIL) 25 MG  tablet Take by mouth.   No facility-administered medications prior to visit.    Review of Systems  Constitutional:  Negative for chills, fatigue and fever.  Genitourinary:  Negative for genital sores, penile discharge, penile pain, penile swelling, scrotal swelling and testicular pain.  Skin:        Bump in right groin area.     {Labs  Heme  Chem  Endocrine  Serology  Results Review (optional):23779}   Objective    BP (!) 149/80   Pulse 69   Temp 98.4 F (36.9 C) (Oral)   Ht 5' 7.01" (1.702 m)   Wt 231 lb 3.2 oz (104.9 kg)   SpO2 98%   BMI 36.20 kg/m  {Show previous vital signs (optional):23777}  Physical Exam Vitals reviewed.  Constitutional:      General: He is awake.     Appearance: Normal appearance. He is well-developed and well-groomed. He is obese.  HENT:     Head: Normocephalic and atraumatic.  Skin:    General: Skin is warm.     Findings: Abscess and erythema present.       Neurological:     Mental Status: He is alert.  Psychiatric:        Behavior: Behavior is cooperative.      No  results found for any visits on 04/19/22.  Assessment & Plan      No follow-ups on file.

## 2022-04-19 NOTE — Patient Instructions (Addendum)
You can use a warm wash cloth over the area to help encourage drainage   I am sending in a script for an antibiotic  Please take this twice per day for 10 days - COMPLETE THE ENTIRE COURSE, unless you have a reaction  Do not try to squeeze or manipulate the area to drain it. This can cause more discomfort and irritation to the area.

## 2022-04-27 ENCOUNTER — Ambulatory Visit (INDEPENDENT_AMBULATORY_CARE_PROVIDER_SITE_OTHER): Payer: Medicare Other | Admitting: Physician Assistant

## 2022-04-27 ENCOUNTER — Encounter: Payer: Self-pay | Admitting: Physician Assistant

## 2022-04-27 VITALS — BP 128/64 | HR 61 | Temp 98.0°F | Wt 228.9 lb

## 2022-04-27 DIAGNOSIS — R42 Dizziness and giddiness: Secondary | ICD-10-CM

## 2022-04-27 DIAGNOSIS — L02214 Cutaneous abscess of groin: Secondary | ICD-10-CM | POA: Diagnosis not present

## 2022-04-27 NOTE — Progress Notes (Signed)
Established Patient Office Visit  Name: Ricky Melton   MRN: [id number]    DOB: 30-Mar-1951   Date:04/27/2022  Today's Provider: Talitha Givens, MHS, PA-C Introduced myself to the patient as a PA-C and provided education on APPs in clinical practice.         Subjective  Chief Complaint  Chief Complaint  Patient presents with   Abscess groin    Abcess right groin is getting better and smaller per patient    HPI  Reports the abscess seems to be getting smaller Denies continued pain  Unsure if it is still red  Doesn't think its draining He is taking Bactrim but had to change dosing schedule- reports he was feeling lightheaded on it Reports he is taking one at lunch and one at dinner to manage  BP- concerned for dizziness Thinks he's been taking HCTZ but is not sure who rx this     Patient Active Problem List   Diagnosis Date Noted   Arthritis of left knee 12/22/2021   Essential hypertension 09/16/2021   Prediabetes 07/11/2021   Advanced care planning/counseling discussion 10/25/2017   Allergy 07/12/2017   Morbid obesity ([place]) 10/19/2016   BPH (benign prostatic hyperplasia) 10/19/2016   CKD (chronic kidney disease), stage II 03/23/2015   CAD (coronary artery disease) 03/23/2015   Benign hematuria 03/23/2015   Hyperlipidemia 03/23/2015   Hypothyroidism 03/23/2015   Gout 03/23/2015    Past Surgical History:  Procedure Laterality Date   cardiac stents     x2   COLONOSCOPY WITH PROPOFOL N/A 05/10/2018   Procedure: COLONOSCOPY WITH PROPOFOL;  Surgeon: Jonathon Bellows, MD;  Location: [facility] ENDOSCOPY;  Service: Gastroenterology;  Laterality: N/A;   EYE SURGERY Right 2021   cataract sx in [place]     Family History  Problem Relation Age of Onset   Hypertension Mother    Diabetes Sister    Diabetes Son     Social History   Tobacco Use   Smoking status: Never   Smokeless tobacco: Former    Types: Chew    Quit date: 2012  Substance Use Topics   Alcohol  use: No    Alcohol/week: 0.0 standard drinks of alcohol     Current Outpatient Medications:    allopurinol (ZYLOPRIM) 300 MG tablet, Take 1 tablet (300 mg total) by mouth daily., Disp: 90 tablet, Rfl: 0   amLODipine (NORVASC) 10 MG tablet, Take 1 tablet (10 mg total) by mouth every evening., Disp: 90 tablet, Rfl: 0   aspirin 81 MG tablet, Take 81 mg by mouth daily., Disp: , Rfl:    benazepril (LOTENSIN) 20 MG tablet, TAKE 1 TABLET BY MOUTH DAILY, Disp: 90 tablet, Rfl: 1   carvedilol (COREG) 12.5 MG tablet, TAKE 1 TABLET BY MOUTH TWICE  DAILY WITH MEALS, Disp: 180 tablet, Rfl: 1   clopidogrel (PLAVIX) 75 MG tablet, TAKE 1 TABLET BY MOUTH DAILY, Disp: 90 tablet, Rfl: 1   diclofenac Sodium (VOLTAREN) 1 % GEL, Apply 4 g topically 4 (four) times daily., Disp: 100 g, Rfl: 3   fexofenadine (ALLEGRA) 180 MG tablet, Take by mouth., Disp: , Rfl:    fluticasone (FLONASE) 50 MCG/ACT nasal spray, Place 2 sprays into both nostrils daily., Disp: 16 g, Rfl: 6   levothyroxine (SYNTHROID) 50 MCG tablet, TAKE 1 TABLET BY MOUTH  DAILY, Disp: 90 tablet, Rfl: 3   meclizine (ANTIVERT) 25 MG tablet, Take 1 tablet (25 mg total) by mouth 3 (three)  times daily as needed for dizziness., Disp: 30 tablet, Rfl: 0   rosuvastatin (CRESTOR) 40 MG tablet, TAKE 1 TABLET BY MOUTH DAILY, Disp: 90 tablet, Rfl: 1   sulfamethoxazole-trimethoprim (BACTRIM DS) 800-160 MG tablet, Take 1 tablet by mouth 2 (two) times daily for 10 days., Disp: 20 tablet, Rfl: 0  Allergies  Allergen Reactions   Hydrochlorothiazide Other (See Comments)    dizziness    I personally reviewed active problem list, medication list, allergies, notes from last encounter, lab results with the patient/caregiver today.   Review of Systems  Skin:        Abscess in groin       Objective  Vitals:   04/27/22 1118 04/27/22 1120 04/27/22 1151  BP: (!) 154/79 (!) 145/79 128/64  Pulse: (!) 59 61   Temp: 98 F (36.7 C)    TempSrc: Oral    SpO2: 98%     Weight: 228 lb 14.4 oz (103.8 kg)      Body mass index is 35.84 kg/m.  Physical Exam Vitals reviewed.  Constitutional:      General: He is awake.     Appearance: Normal appearance. He is well-developed and well-groomed. He is obese.  HENT:     Head: Normocephalic and atraumatic.  Skin:      Neurological:     Mental Status: He is alert.  Psychiatric:        Behavior: Behavior is cooperative.      No results found for this or any previous visit (from the past 2160 hour(s)).   PHQ2/9:    04/27/2022   11:51 AM 04/19/2022   11:43 AM 01/05/2022   12:08 PM 12/22/2021    9:13 AM 10/27/2021    8:52 AM  Depression screen PHQ 2/9  Decreased Interest 0 0 0 0 0  Down, Depressed, Hopeless 0 0 0 0 0  PHQ - 2 Score 0 0 0 0 0  Altered sleeping 0 0 0 0 0  Tired, decreased energy 0 0 0 0 0  Change in appetite 0 0 0 0 0  Feeling bad or failure about yourself  0 0 0 0 0  Trouble concentrating 0 0 0 0 0  Moving slowly or fidgety/restless 0 0 0 0 0  Suicidal thoughts 0 0 0 0 0  PHQ-9 Score 0 0 0 0 0  Difficult doing work/chores Not difficult at all Not difficult at all         Fall Risk:    04/27/2022   11:51 AM 04/19/2022   11:43 AM 01/05/2022   12:01 PM 12/22/2021    9:12 AM 12/22/2021    9:11 AM  Fall Risk   Falls in the past year? 0 0 0 0 0  Number falls in past yr: 0 0 0  0  Injury with Fall? 0 0 0  0  Risk for fall due to : No Fall Risks No Fall Risks   No Fall Risks  Follow up Falls evaluation completed Falls evaluation completed Education provided;Falls prevention discussed;Falls evaluation completed  Falls evaluation completed      Functional Status Survey:      Assessment & Plan  Problem List Items Addressed This Visit   None Visit Diagnoses     Abscess of groin, right    -  Primary Acute, resolving Pt is finishing Bactrim for this and reports pain and size of abscess has decreased Area is still indurated by abscess is much smaller and does not appear  as  inflamed as on initial apt Recommend he finish course of Bactrim and use warm compresses to area to encourage resolution Follow up as needed     Dizziness     Patient has allergy with dizziness as main SE to HCTZ Med list shows he is taking this but he is not sure and does not know who sent this script in for him Recommend he DC this if he is taking and bring all medications to next apts to sort out medication list Follow up as needed         No follow-ups on file.   I, Ladelle Teodoro E Emslee Lopezmartinez, PA-C, have reviewed all documentation for this visit. The documentation on 04/27/22 for the exam, diagnosis, procedures, and orders are all accurate and complete.   Talitha Givens, MHS, PA-C [facility]

## 2022-04-27 NOTE — Patient Instructions (Addendum)
Please go home and check to see if you are taking Hydrochlorothiazide- this is listed as an allergy that causes dizziness in your chart and may be what's making you feel dizzy now  Please stop taking this for now.  Take your blood pressure daily and record it. If it starts to get above 135/85 for a week or so please call us   Make sure you finish the Bactrim completely Use warm compresses on the area to help soften it up and promote healing   Please bring all of your medications to your next appointments so we can verify them and update your list.

## 2022-05-07 ENCOUNTER — Other Ambulatory Visit: Payer: Self-pay | Admitting: Family Medicine

## 2022-05-07 DIAGNOSIS — M1000 Idiopathic gout, unspecified site: Secondary | ICD-10-CM

## 2022-05-08 NOTE — Telephone Encounter (Signed)
Requested medications are due for refill today.  yes  Requested medications are on the active medications list.  yes  Last refill. Both refilled 12/22/2021 #90 0 refills  Future visit scheduled.   yes  Notes to clinic.  Pt is requesting a 1 year supply. Please advise.    Requested Prescriptions  Pending Prescriptions Disp Refills   allopurinol (ZYLOPRIM) 300 MG tablet [Pharmacy Med Name: Allopurinol 300 MG Oral Tablet] 90 tablet 3    Sig: TAKE 1 TABLET BY MOUTH DAILY     Endocrinology:  Gout Agents - allopurinol Failed - 05/07/2022 10:58 PM      Failed - Cr in normal range and within 360 days    Creatinine, Ser  Date Value Ref Range Status  12/22/2021 1.44 (H) 0.76 - 1.27 mg/dL Final         Passed - Uric Acid in normal range and within 360 days    Uric Acid  Date Value Ref Range Status  12/22/2021 5.3 3.8 - 8.4 mg/dL Final    Comment:               Therapeutic target for gout patients: <6.0         Passed - Valid encounter within last 12 months    Recent Outpatient Visits           1 week ago Abscess of groin, right   [employer], Erin E, PA-C   2 weeks ago Abscess of groin, right   [employer], Erin E, PA-C   4 months ago Arthritis of left knee   [employer], Megan P, DO   5 months ago Acute pain of left knee   [facility] [place], Megan P, DO   6 months ago Acute pain of left knee   [facility] Kathrine Haddock, NP       Future Appointments             In 2 months Johnson, Megan P, DO [facility], [facility]            Passed - CBC within normal limits and completed in the last 12 months    WBC  Date Value Ref Range Status  12/22/2021 7.6 3.4 - 10.8 x10E3/uL Final  06/26/2020 11.7 (H) 4.0 - 10.5 K/uL Final   RBC  Date Value Ref Range Status  12/22/2021 5.29 4.14 - 5.80 x10E6/uL Final  06/26/2020 4.59 4.22 - 5.81 MIL/uL Final   Hemoglobin  Date Value Ref  Range Status  12/22/2021 15.2 13.0 - 17.7 g/dL Final   Hematocrit  Date Value Ref Range Status  12/22/2021 45.4 37.5 - 51.0 % Final   MCHC  Date Value Ref Range Status  12/22/2021 33.5 31.5 - 35.7 g/dL Final  06/26/2020 33.5 30.0 - 36.0 g/dL Final   [facility]  Date Value Ref Range Status  12/22/2021 28.7 26.6 - 33.0 pg Final  06/26/2020 29.0 26.0 - 34.0 pg Final   MCV  Date Value Ref Range Status  12/22/2021 86 79 - 97 fL Final   No results found for: "PLTCOUNTKUC", "LABPLAT", "POCPLA" RDW  Date Value Ref Range Status  12/22/2021 13.6 11.6 - 15.4 % Final          amLODipine (NORVASC) 10 MG tablet [Pharmacy Med Name: amLODIPine Besylate 10 MG Oral Tablet] 90 tablet 3    Sig: TAKE 1 TABLET BY MOUTH IN THE  EVENING     Cardiovascular: Calcium Channel  Blockers 2 Passed - 05/07/2022 10:58 PM      Passed - Last BP in normal range    BP Readings from Last 1 Encounters:  04/27/22 128/64         Passed - Last Heart Rate in normal range    Pulse Readings from Last 1 Encounters:  04/27/22 61         Passed - Valid encounter within last 6 months    Recent Outpatient Visits           1 week ago Abscess of groin, right   [employer], Erin E, PA-C   2 weeks ago Abscess of groin, right   [employer], Erin E, PA-C   4 months ago Arthritis of left knee   [place], Megan P, DO   5 months ago Acute pain of left knee   [facility] [place], Megan P, DO   6 months ago Acute pain of left knee   [facility] Kathrine Haddock, NP       Future Appointments             In 2 months Wynetta Emery, Barb Merino, DO [employer], [facility]

## 2022-05-16 DIAGNOSIS — K1379 Other lesions of oral mucosa: Secondary | ICD-10-CM | POA: Diagnosis not present

## 2022-05-16 DIAGNOSIS — D1039 Benign neoplasm of other parts of mouth: Secondary | ICD-10-CM | POA: Diagnosis not present

## 2022-05-18 DIAGNOSIS — I10 Essential (primary) hypertension: Secondary | ICD-10-CM | POA: Diagnosis not present

## 2022-05-18 DIAGNOSIS — E782 Mixed hyperlipidemia: Secondary | ICD-10-CM | POA: Diagnosis not present

## 2022-05-18 DIAGNOSIS — I2510 Atherosclerotic heart disease of native coronary artery without angina pectoris: Secondary | ICD-10-CM | POA: Diagnosis not present

## 2022-05-18 DIAGNOSIS — I340 Nonrheumatic mitral (valve) insufficiency: Secondary | ICD-10-CM | POA: Diagnosis not present

## 2022-07-09 ENCOUNTER — Other Ambulatory Visit: Payer: Self-pay | Admitting: Family Medicine

## 2022-07-09 DIAGNOSIS — M1000 Idiopathic gout, unspecified site: Secondary | ICD-10-CM

## 2022-07-11 NOTE — Telephone Encounter (Signed)
Requested Prescriptions  Pending Prescriptions Disp Refills  . amLODipine (NORVASC) 10 MG tablet [Pharmacy Med Name: amLODIPine Besylate 10 MG Oral Tablet] 90 tablet 1    Sig: TAKE 1 TABLET BY MOUTH IN THE  EVENING     Cardiovascular: Calcium Channel Blockers 2 Passed - 07/09/2022 11:12 PM      Passed - Last BP in normal range    BP Readings from Last 1 Encounters:  04/27/22 128/64         Passed - Last Heart Rate in normal range    Pulse Readings from Last 1 Encounters:  04/27/22 61         Passed - Valid encounter within last 6 months    Recent Outpatient Visits          2 months ago Abscess of groin, right   [employer], Erin E, PA-C   2 months ago Abscess of groin, right   [employer], Erin E, PA-C   6 months ago Arthritis of left knee   [place], Megan P, DO   7 months ago Acute pain of left knee   [facility] [place], Megan P, DO   8 months ago Acute pain of left knee   [facility] Kathrine Haddock, NP      Future Appointments            In 2 days Wynetta Emery, Barb Merino, DO [employer], [place]           . allopurinol (ZYLOPRIM) 300 MG tablet [Pharmacy Med Name: Allopurinol 300 MG Oral Tablet] 90 tablet 1    Sig: TAKE 1 TABLET BY MOUTH DAILY     Endocrinology:  Gout Agents - allopurinol Failed - 07/09/2022 11:12 PM      Failed - Cr in normal range and within 360 days    Creatinine, Ser  Date Value Ref Range Status  12/22/2021 1.44 (H) 0.76 - 1.27 mg/dL Final         Passed - Uric Acid in normal range and within 360 days    Uric Acid  Date Value Ref Range Status  12/22/2021 5.3 3.8 - 8.4 mg/dL Final    Comment:               Therapeutic target for gout patients: <6.0         Passed - Valid encounter within last 12 months    Recent Outpatient Visits          2 months ago Abscess of groin, right   [facility] [place], Erin E, PA-C   2 months ago  Abscess of groin, right   [employer], Erin E, PA-C   6 months ago Arthritis of left knee   [employer], Megan P, DO   7 months ago Acute pain of left knee   [place], Megan P, DO   8 months ago Acute pain of left knee   [facility] Kathrine Haddock, NP      Future Appointments            In 2 days Wynetta Emery, Megan P, DO [facility], [facility]           Passed - CBC within normal limits and completed in the last 12 months    WBC  Date Value Ref Range Status  12/22/2021 7.6 3.4 - 10.8 x10E3/uL Final  06/26/2020 11.7 (H) 4.0 -  10.5 K/uL Final   RBC  Date Value Ref Range Status  12/22/2021 5.29 4.14 - 5.80 x10E6/uL Final  06/26/2020 4.59 4.22 - 5.81 MIL/uL Final   Hemoglobin  Date Value Ref Range Status  12/22/2021 15.2 13.0 - 17.7 g/dL Final   Hematocrit  Date Value Ref Range Status  12/22/2021 45.4 37.5 - 51.0 % Final   MCHC  Date Value Ref Range Status  12/22/2021 33.5 31.5 - 35.7 g/dL Final  06/26/2020 33.5 30.0 - 36.0 g/dL Final   [facility]  Date Value Ref Range Status  12/22/2021 28.7 26.6 - 33.0 pg Final  06/26/2020 29.0 26.0 - 34.0 pg Final   MCV  Date Value Ref Range Status  12/22/2021 86 79 - 97 fL Final   No results found for: "PLTCOUNTKUC", "LABPLAT", "POCPLA" RDW  Date Value Ref Range Status  12/22/2021 13.6 11.6 - 15.4 % Final

## 2022-07-13 ENCOUNTER — Encounter: Payer: Self-pay | Admitting: Family Medicine

## 2022-07-13 ENCOUNTER — Ambulatory Visit (INDEPENDENT_AMBULATORY_CARE_PROVIDER_SITE_OTHER): Payer: Medicare Other | Admitting: Family Medicine

## 2022-07-13 VITALS — BP 142/66 | HR 61 | Temp 97.9°F | Ht 67.0 in | Wt 237.2 lb

## 2022-07-13 DIAGNOSIS — I10 Essential (primary) hypertension: Secondary | ICD-10-CM | POA: Diagnosis not present

## 2022-07-13 DIAGNOSIS — Z23 Encounter for immunization: Secondary | ICD-10-CM | POA: Diagnosis not present

## 2022-07-13 DIAGNOSIS — M1000 Idiopathic gout, unspecified site: Secondary | ICD-10-CM

## 2022-07-13 DIAGNOSIS — R7303 Prediabetes: Secondary | ICD-10-CM

## 2022-07-13 DIAGNOSIS — N400 Enlarged prostate without lower urinary tract symptoms: Secondary | ICD-10-CM | POA: Diagnosis not present

## 2022-07-13 DIAGNOSIS — E039 Hypothyroidism, unspecified: Secondary | ICD-10-CM | POA: Diagnosis not present

## 2022-07-13 DIAGNOSIS — Z0000 Encounter for general adult medical examination without abnormal findings: Secondary | ICD-10-CM | POA: Diagnosis not present

## 2022-07-13 DIAGNOSIS — E785 Hyperlipidemia, unspecified: Secondary | ICD-10-CM | POA: Diagnosis not present

## 2022-07-13 LAB — BAYER DCA HB A1C WAIVED: HB A1C (BAYER DCA - WAIVED): 5.7 % — ABNORMAL HIGH (ref 4.8–5.6)

## 2022-07-13 LAB — URINALYSIS, ROUTINE W REFLEX MICROSCOPIC
Bilirubin, UA: NEGATIVE
Glucose, UA: NEGATIVE
Ketones, UA: NEGATIVE
Leukocytes,UA: NEGATIVE
Nitrite, UA: NEGATIVE
Specific Gravity, UA: 1.015 (ref 1.005–1.030)
Urobilinogen, Ur: 0.2 mg/dL (ref 0.2–1.0)
pH, UA: 6 (ref 5.0–7.5)

## 2022-07-13 LAB — MICROALBUMIN, URINE WAIVED
Creatinine, Urine Waived: 50 mg/dL (ref 10–300)
Microalb, Ur Waived: 150 mg/L — ABNORMAL HIGH (ref 0–19)
Microalb/Creat Ratio: >300 mg/g — ABNORMAL HIGH (ref <30)

## 2022-07-13 LAB — MICROSCOPIC EXAMINATION
Bacteria, UA: NONE SEEN
Epithelial Cells (non renal): NONE SEEN /hpf (ref 0–10)
WBC, UA: NONE SEEN /hpf (ref 0–5)

## 2022-07-13 MED ORDER — FLUTICASONE PROPIONATE 50 MCG/ACT NA SUSP: 2.0000 | Freq: Every day | NASAL | 6 refills | Status: DC

## 2022-07-13 MED ORDER — ALLOPURINOL 300 MG PO TABS: 300.0000 mg | ORAL_TABLET | Freq: Every day | ORAL | 1 refills | Status: DC

## 2022-07-13 MED ORDER — MECLIZINE HCL 25 MG PO TABS: 25.0000 mg | ORAL_TABLET | Freq: Three times a day (TID) | ORAL | 0 refills | Status: DC | PRN

## 2022-07-13 MED ORDER — ROSUVASTATIN CALCIUM 40 MG PO TABS: 40.0000 mg | ORAL_TABLET | Freq: Every day | ORAL | 1 refills | Status: DC

## 2022-07-13 MED ORDER — AMLODIPINE BESYLATE 10 MG PO TABS: 10.0000 mg | ORAL_TABLET | Freq: Every evening | ORAL | 1 refills | Status: DC

## 2022-07-13 MED ORDER — CARVEDILOL 12.5 MG PO TABS: 12.5000 mg | ORAL_TABLET | Freq: Two times a day (BID) | ORAL | 1 refills | Status: DC

## 2022-07-13 MED ORDER — DICLOFENAC SODIUM 1 % EX GEL: 4.0000 g | Freq: Four times a day (QID) | CUTANEOUS | 3 refills | Status: DC

## 2022-07-13 MED ORDER — BENAZEPRIL HCL 20 MG PO TABS: 20.0000 mg | ORAL_TABLET | Freq: Every day | ORAL | 1 refills | Status: DC

## 2022-07-13 MED ORDER — CLOPIDOGREL BISULFATE 75 MG PO TABS: 75.0000 mg | ORAL_TABLET | Freq: Every day | ORAL | 1 refills | Status: DC

## 2022-07-13 NOTE — Assessment & Plan Note (Signed)
Under good control on current regimen. Continue current regimen. Continue to monitor. Call with any concerns. Refills given. Labs drawn today.   

## 2022-07-13 NOTE — Assessment & Plan Note (Signed)
Rechecking labs today. Await results. Treat as needed.  °

## 2022-07-13 NOTE — Assessment & Plan Note (Signed)
Running high. Will work on [employer] and recheck 6 weeks, if still high will increase benazepril. Refills given. Labs drawn today.

## 2022-07-13 NOTE — Assessment & Plan Note (Signed)
Under good control on current regimen. Continue current regimen. Continue to monitor. Call with any concerns. Labs drawn today.

## 2022-07-13 NOTE — Progress Notes (Signed)
BP (!) 142/66   Pulse 61   Temp 97.9 F (36.6 C)   Ht '5\' 7"'$  (1.702 m)   Wt 237 lb 3.2 oz (107.6 kg)   SpO2 99%   BMI 37.15 kg/m    Subjective:    Patient ID: Ricky Melton, male    DOB: [birth year], 71 y.o.   MRN: [id number]  HPI: Ricky Melton is a 71 y.o. male presenting on 07/13/2022 for comprehensive medical examination. Current medical complaints include:  HYPERTENSION / HYPERLIPIDEMIA Satisfied with current treatment? no Duration of hypertension: years BP monitoring frequency: not checking BP medication side effects: no Past BP meds: amlodipine, benazepril, cervedilol Duration of hyperlipidemia: chronic Cholesterol medication side effects: no Cholesterol supplements: none Past cholesterol medications: crestor Medication compliance: excellent compliance Aspirin: yes Recent stressors: no Recurrent headaches: no Visual changes: no Palpitations: no Dyspnea: no Chest pain: no Lower extremity edema: no Dizzy/lightheaded: no  HYPOTHYROIDISM Thyroid control status:controlled Satisfied with current treatment? yes Medication side effects: no Medication compliance: excellent compliance Recent dose adjustment:no Fatigue: no Cold intolerance: no Heat intolerance: no Weight gain: no Weight loss: no Constipation: no Diarrhea/loose stools: no Palpitations: no Lower extremity edema: no Anxiety/depressed mood: no  No gout flares. Tolerating his medicine well.  Impaired Fasting Glucose HbA1C:  Lab Results  Component Value Date   HGBA1C 5.7 (H) 07/13/2022   Duration of elevated blood sugar: chronic Polydipsia: no Polyuria: no Weight change: no Visual disturbance: no Glucose Monitoring: no Diabetic Education: Not Completed Family history of diabetes: yes  Interim Problems from his last visit: no  Depression Screen done today and results listed below:     07/13/2022    8:13 AM 04/27/2022   11:51 AM 04/19/2022   11:43 AM 01/05/2022   12:08 PM 12/22/2021     9:13 AM  Depression screen PHQ 2/9  Decreased Interest 0 0 0 0 0  Down, Depressed, Hopeless 0 0 0 0 0  PHQ - 2 Score 0 0 0 0 0  Altered sleeping 0 0 0 0 0  Tired, decreased energy 0 0 0 0 0  Change in appetite 0 0 0 0 0  Feeling bad or failure about yourself  0 0 0 0 0  Trouble concentrating 0 0 0 0 0  Moving slowly or fidgety/restless 0 0 0 0 0  Suicidal thoughts 0 0 0 0 0  PHQ-9 Score 0 0 0 0 0  Difficult doing work/chores  Not difficult at all Not difficult at all      Past Medical History:  Past Medical History:  Diagnosis Date   Benign hematuria    CAD (coronary artery disease)    Chronic kidney disease    Hyperlipidemia    Hypertension     Surgical History:  Past Surgical History:  Procedure Laterality Date   cardiac stents     x2   COLONOSCOPY WITH PROPOFOL N/A 05/10/2018   Procedure: COLONOSCOPY WITH PROPOFOL;  Surgeon: Jonathon Bellows, MD;  Location: [facility] ENDOSCOPY;  Service: Gastroenterology;  Laterality: N/A;   EYE SURGERY Right 2021   cataract sx in [place]     Medications:  Current Outpatient Medications on File Prior to Visit  Medication Sig   aspirin 81 MG tablet Take 81 mg by mouth daily.   fexofenadine (ALLEGRA) 180 MG tablet Take by mouth.   levothyroxine (SYNTHROID) 50 MCG tablet TAKE 1 TABLET BY MOUTH  DAILY   No current facility-administered medications on file prior to visit.  Allergies:  Allergies  Allergen Reactions   Hydrochlorothiazide Other (See Comments)    dizziness    Social History:  Social History   Socioeconomic History   Marital status: Married    Spouse name: Not on file   Number of children: Not on file   Years of education: Not on file   Highest education level: High school graduate  Occupational History   Occupation: maintenence     Comment: part time   Tobacco Use   Smoking status: Never   Smokeless tobacco: Former    Types: Chew    Quit date: 2012  Vaping Use   Vaping Use: Never used  Substance and Sexual  Activity   Alcohol use: No    Alcohol/week: 0.0 standard drinks of alcohol   Drug use: No   Sexual activity: Yes  Other Topics Concern   Not on file  Social History Narrative   Plays golf with friends, mows yards    Social Determinants of Health   Financial Resource Strain: Low Risk  (01/05/2022)   Overall Financial Resource Strain (CARDIA)    Difficulty of Paying Living Expenses: Not hard at all  Food Insecurity: No Food Insecurity (01/05/2022)   Hunger Vital Sign    Worried About Running Out of Food in the Last Year: Never true    Drain in the Last Year: Never true  Transportation Needs: No Transportation Needs (01/05/2022)   PRAPARE - Hydrologist (Medical): No    Lack of Transportation (Non-Medical): No  Physical Activity: Insufficiently Active (01/05/2022)   Exercise Vital Sign    Days of Exercise per Week: 2 days    Minutes of Exercise per Session: 60 min  Stress: No Stress Concern Present (01/05/2022)   [place]    Feeling of Stress : Not at all  Social Connections: [place] (01/05/2022)   Social Connection and Isolation Panel [NHANES]    Frequency of Communication with Friends and Family: More than three times a week    Frequency of Social Gatherings with Friends and Family: More than three times a week    Attends Religious Services: More than 4 times per year    Active Member of [employer] or Organizations: Yes    Attends Music therapist: More than 4 times per year    Marital Status: Married  Human resources officer Violence: Not At Risk (01/05/2022)   Humiliation, Afraid, Rape, and Kick questionnaire    Fear of Current or Ex-Partner: No    Emotionally Abused: No    Physically Abused: No    Sexually Abused: No   Social History   Tobacco Use  Smoking Status Never  Smokeless Tobacco Former   Types: Chew   Quit date: 2012   Social History    Substance and Sexual Activity  Alcohol Use No   Alcohol/week: 0.0 standard drinks of alcohol    Family History:  Family History  Problem Relation Age of Onset   Hypertension Mother    Diabetes Sister    Diabetes Son     Past medical history, surgical history, medications, allergies, family history and social history reviewed with patient today and changes made to appropriate areas of the chart.   Review of Systems  Constitutional: Negative.   HENT: Negative.    Eyes: Negative.   Respiratory: Negative.    Cardiovascular: Negative.   Gastrointestinal: Negative.   Genitourinary: Negative.   Musculoskeletal:  Negative.   Skin: Negative.   Neurological: Negative.   Endo/Heme/Allergies: Negative.   Psychiatric/Behavioral: Negative.     All other ROS negative except what is listed above and in the HPI.      Objective:    BP (!) 142/66   Pulse 61   Temp 97.9 F (36.6 C)   Ht '5\' 7"'$  (1.702 m)   Wt 237 lb 3.2 oz (107.6 kg)   SpO2 99%   BMI 37.15 kg/m   Wt Readings from Last 3 Encounters:  07/13/22 237 lb 3.2 oz (107.6 kg)  04/27/22 228 lb 14.4 oz (103.8 kg)  04/19/22 231 lb 3.2 oz (104.9 kg)    Physical Exam  Results for orders placed or performed in visit on 07/13/22  Microscopic Examination   Urine  Result Value Ref Range   WBC, UA None seen 0 - 5 /hpf   RBC, Urine 3-10 (A) 0 - 2 /hpf   Epithelial Cells (non renal) None seen 0 - 10 /hpf   Bacteria, UA None seen None seen/Few  Urinalysis, Routine w reflex microscopic  Result Value Ref Range   Specific Gravity, UA 1.015 1.005 - 1.030   pH, UA 6.0 5.0 - 7.5   Color, UA Yellow Yellow   Appearance Ur Clear Clear   Leukocytes,UA Negative Negative   Protein,UA 3+ (A) Negative/Trace   Glucose, UA Negative Negative   Ketones, UA Negative Negative   RBC, UA 2+ (A) Negative   Bilirubin, UA Negative Negative   Urobilinogen, Ur 0.2 0.2 - 1.0 mg/dL   Nitrite, UA Negative Negative   Microscopic Examination See  below:   Microalbumin, Urine Waived  Result Value Ref Range   Microalb, Ur Waived 150 (H) 0 - 19 mg/L   Creatinine, Urine Waived 50 10 - 300 mg/dL   Microalb/Creat Ratio >300 (H) <30 mg/g  Bayer DCA Hb A1c Waived  Result Value Ref Range   HB A1C (BAYER DCA - WAIVED) 5.7 (H) 4.8 - 5.6 %      Assessment & Plan:   Problem List Items Addressed This Visit       Cardiovascular and Mediastinum   Essential hypertension    Running high. Will work on [employer] and recheck 6 weeks, if still high will increase benazepril. Refills given. Labs drawn today.       Relevant Medications   amLODipine (NORVASC) 10 MG tablet   benazepril (LOTENSIN) 20 MG tablet   carvedilol (COREG) 12.5 MG tablet   rosuvastatin (CRESTOR) 40 MG tablet   Other Relevant Orders   Comprehensive metabolic panel   CBC with Differential/Platelet   Urinalysis, Routine w reflex microscopic (Completed)   Microalbumin, Urine Waived (Completed)     Endocrine   Hypothyroidism    Rechecking labs today. Await results. Treat as needed.       Relevant Medications   carvedilol (COREG) 12.5 MG tablet   Other Relevant Orders   Comprehensive metabolic panel   CBC with Differential/Platelet   TSH     Genitourinary   BPH (benign prostatic hyperplasia)    Under good control on current regimen. Continue current regimen. Continue to monitor. Call with any concerns. Labs drawn today.       Relevant Orders   Comprehensive metabolic panel   CBC with Differential/Platelet   PSA     Other   Hyperlipidemia    Under good control on current regimen. Continue current regimen. Continue to monitor. Call with any concerns. Refills given. Labs drawn today.  Relevant Medications   amLODipine (NORVASC) 10 MG tablet   benazepril (LOTENSIN) 20 MG tablet   carvedilol (COREG) 12.5 MG tablet   rosuvastatin (CRESTOR) 40 MG tablet   Other Relevant Orders   Comprehensive metabolic panel   CBC with Differential/Platelet   Lipid  Panel w/o Chol/HDL Ratio   Gout    Under good control on current regimen. Continue current regimen. Continue to monitor. Call with any concerns. Refills given. Labs drawn today.        Relevant Medications   allopurinol (ZYLOPRIM) 300 MG tablet   Other Relevant Orders   Comprehensive metabolic panel   CBC with Differential/Platelet   Uric acid   Prediabetes    Rechecking labs today. Await results. Treat as needed.       Relevant Orders   Comprehensive metabolic panel   CBC with Differential/Platelet   Bayer DCA Hb A1c Waived (Completed)   Other Visit Diagnoses     Routine general medical examination at a health care facility    -  Primary   Vaccines up to date. Screening labs checked today. Colonoscopy up to date. Continue diet and exercise. Call with any concerns.   Need for influenza vaccination       Flu shot given today.   Relevant Orders   Flu Vaccine QUAD High Dose(Fluad) (Completed)        Discussed aspirin prophylaxis for myocardial infarction prevention and decision was made to continue ASA  LABORATORY TESTING:  Health maintenance labs ordered today as discussed above.   The natural history of prostate cancer and ongoing controversy regarding screening and potential treatment outcomes of prostate cancer has been discussed with the patient. The meaning of a false positive PSA and a false negative PSA has been discussed. He indicates understanding of the limitations of this screening test and wishes to proceed with screening PSA testing.   IMMUNIZATIONS:   - Tdap: Tetanus vaccination status reviewed: last tetanus booster within 10 years. - Influenza: Administered today - Pneumovax: Up to date - Prevnar: Up to date - COVID: Up to date - HPV: Not applicable - Shingrix vaccine: Given elsewhere  SCREENING: - Colonoscopy: Up to date  Discussed with patient purpose of the colonoscopy is to detect colon cancer at curable precancerous or early stages   PATIENT  COUNSELING:    Sexuality: Discussed sexually transmitted diseases, partner selection, use of condoms, avoidance of unintended pregnancy  and contraceptive alternatives.   Advised to avoid cigarette smoking.  I discussed with the patient that most people either abstain from alcohol or drink within safe limits (<=14/week and <=4 drinks/occasion for males, <=7/weeks and <= 3 drinks/occasion for females) and that the risk for alcohol disorders and other health effects rises proportionally with the number of drinks per week and how often a drinker exceeds daily limits.  Discussed cessation/primary prevention of drug use and availability of treatment for abuse.   Diet: Encouraged to adjust caloric intake to maintain  or achieve ideal body weight, to reduce intake of dietary saturated fat and total fat, to limit sodium intake by avoiding high sodium foods and not adding table salt, and to maintain adequate dietary potassium and calcium preferably from fresh fruits, vegetables, and low-fat dairy products.    stressed the importance of regular exercise  Injury prevention: Discussed safety belts, safety helmets, smoke detector, smoking near bedding or upholstery.   Dental health: Discussed importance of regular tooth brushing, flossing, and dental visits.   Follow up plan: NEXT PREVENTATIVE PHYSICAL  DUE IN 1 YEAR. Return in about 6 weeks (around 08/24/2022) for BP check.

## 2022-07-14 LAB — CBC WITH DIFFERENTIAL/PLATELET
Basophils Absolute: 0.1 10*3/uL (ref 0.0–0.2)
Basos: 1 %
EOS (ABSOLUTE): 0.2 10*3/uL (ref 0.0–0.4)
Eos: 3 %
Hematocrit: 46.1 % (ref 37.5–51.0)
Hemoglobin: 14.9 g/dL (ref 13.0–17.7)
Immature Grans (Abs): 0 10*3/uL (ref 0.0–0.1)
Immature Granulocytes: 0 %
Lymphocytes Absolute: 2.1 10*3/uL (ref 0.7–3.1)
Lymphs: 32 %
MCH: 28.4 pg (ref 26.6–33.0)
MCHC: 32.3 g/dL (ref 31.5–35.7)
MCV: 88 fL (ref 79–97)
Monocytes Absolute: 0.5 10*3/uL (ref 0.1–0.9)
Monocytes: 8 %
Neutrophils Absolute: 3.7 10*3/uL (ref 1.4–7.0)
Neutrophils: 56 %
Platelets: 225 10*3/uL (ref 150–450)
RBC: 5.25 x10E6/uL (ref 4.14–5.80)
RDW: 13.7 % (ref 11.6–15.4)
WBC: 6.5 10*3/uL (ref 3.4–10.8)

## 2022-07-14 LAB — COMPREHENSIVE METABOLIC PANEL
ALT: 15 IU/L (ref 0–44)
AST: 20 IU/L (ref 0–40)
Albumin/Globulin Ratio: 1.9 (ref 1.2–2.2)
Albumin: 4.5 g/dL (ref 3.8–4.8)
Alkaline Phosphatase: 58 IU/L (ref 44–121)
BUN/Creatinine Ratio: 15 (ref 10–24)
BUN: 21 mg/dL (ref 8–27)
Bilirubin Total: 0.4 mg/dL (ref 0.0–1.2)
CO2: 21 mmol/L (ref 20–29)
Calcium: 9.7 mg/dL (ref 8.6–10.2)
Chloride: 107 mmol/L — ABNORMAL HIGH (ref 96–106)
Creatinine, Ser: 1.39 mg/dL — ABNORMAL HIGH (ref 0.76–1.27)
Globulin, Total: 2.4 g/dL (ref 1.5–4.5)
Glucose: 90 mg/dL (ref 70–99)
Potassium: 4.5 mmol/L (ref 3.5–5.2)
Sodium: 141 mmol/L (ref 134–144)
Total Protein: 6.9 g/dL (ref 6.0–8.5)
eGFR: 54 mL/min/{1.73_m2} — ABNORMAL LOW (ref >59)

## 2022-07-14 LAB — LIPID PANEL W/O CHOL/HDL RATIO
Cholesterol, Total: 127 mg/dL (ref 100–199)
HDL: 43 mg/dL (ref >39)
LDL Chol Calc (NIH): 65 mg/dL (ref 0–99)
Triglycerides: 103 mg/dL (ref 0–149)
VLDL Cholesterol Cal: 19 mg/dL (ref 5–40)

## 2022-07-14 LAB — TSH: TSH: 2.44 u[IU]/mL (ref 0.450–4.500)

## 2022-07-14 LAB — URIC ACID: Uric Acid: 5.5 mg/dL (ref 3.8–8.4)

## 2022-07-14 LAB — PSA: Prostate Specific Ag, Serum: 1.8 ng/mL (ref 0.0–4.0)

## 2022-07-24 ENCOUNTER — Telehealth: Payer: Self-pay | Admitting: Family Medicine

## 2022-07-24 NOTE — Telephone Encounter (Signed)
Called patient to discuss any questions regarding lab results. Patient hung up.   OK for PEC to gather information if patient calls back.

## 2022-07-24 NOTE — Telephone Encounter (Signed)
Copied from [place] [id number]. Topic: General - Inquiry >> Jul 24, 2022 11:34 AM Cyndi Bender wrote: Reason for CRM: Pt requests return call to go over his most recent lab results. Cb# [id number]

## 2022-07-24 NOTE — Telephone Encounter (Signed)
Spoke with patient to notify of recent lab results. Patient requested a copy of his lab results to be mailed as he is unsure how to MyChart to review his lab results. Patient verbalized understanding and has no further questions at this time.

## 2022-07-31 DIAGNOSIS — R809 Proteinuria, unspecified: Secondary | ICD-10-CM | POA: Diagnosis not present

## 2022-07-31 DIAGNOSIS — N1832 Chronic kidney disease, stage 3b: Secondary | ICD-10-CM | POA: Diagnosis not present

## 2022-07-31 DIAGNOSIS — I10 Essential (primary) hypertension: Secondary | ICD-10-CM | POA: Diagnosis not present

## 2022-07-31 DIAGNOSIS — M1000 Idiopathic gout, unspecified site: Secondary | ICD-10-CM | POA: Diagnosis not present

## 2022-07-31 DIAGNOSIS — N281 Cyst of kidney, acquired: Secondary | ICD-10-CM | POA: Diagnosis not present

## 2022-08-09 DIAGNOSIS — N1832 Chronic kidney disease, stage 3b: Secondary | ICD-10-CM | POA: Diagnosis not present

## 2022-08-09 DIAGNOSIS — M1000 Idiopathic gout, unspecified site: Secondary | ICD-10-CM | POA: Diagnosis not present

## 2022-08-09 DIAGNOSIS — I10 Essential (primary) hypertension: Secondary | ICD-10-CM | POA: Diagnosis not present

## 2022-08-09 DIAGNOSIS — N281 Cyst of kidney, acquired: Secondary | ICD-10-CM | POA: Diagnosis not present

## 2022-08-09 DIAGNOSIS — R809 Proteinuria, unspecified: Secondary | ICD-10-CM | POA: Diagnosis not present

## 2022-08-24 ENCOUNTER — Encounter: Payer: Self-pay | Admitting: Family Medicine

## 2022-08-24 ENCOUNTER — Ambulatory Visit (INDEPENDENT_AMBULATORY_CARE_PROVIDER_SITE_OTHER): Payer: Medicare Other | Admitting: Family Medicine

## 2022-08-24 ENCOUNTER — Telehealth: Payer: Self-pay | Admitting: Family Medicine

## 2022-08-24 VITALS — BP 146/70 | HR 66 | Temp 98.3°F | Ht 67.0 in | Wt 232.7 lb

## 2022-08-24 DIAGNOSIS — I10 Essential (primary) hypertension: Secondary | ICD-10-CM

## 2022-08-24 DIAGNOSIS — M1000 Idiopathic gout, unspecified site: Secondary | ICD-10-CM

## 2022-08-24 DIAGNOSIS — R051 Acute cough: Secondary | ICD-10-CM

## 2022-08-24 MED ORDER — BENAZEPRIL HCL 20 MG PO TABS: 30.0000 mg | ORAL_TABLET | Freq: Every day | ORAL | 1 refills | Status: DC

## 2022-08-24 MED ORDER — ALLOPURINOL 300 MG PO TABS: 150.0000 mg | ORAL_TABLET | Freq: Every day | ORAL | 1 refills | Status: DC

## 2022-08-24 MED ORDER — BENZONATATE 200 MG PO CAPS: 200.0000 mg | ORAL_CAPSULE | Freq: Two times a day (BID) | ORAL | 0 refills | Status: DC | PRN

## 2022-08-24 NOTE — Assessment & Plan Note (Signed)
Still running high. Will increase his benazepril to '30mg'$  and recheck 1 month. Call with any concerns.

## 2022-08-24 NOTE — Patient Instructions (Signed)
Increase your benazepril to 1.5 pills (30 mg)

## 2022-08-24 NOTE — Assessment & Plan Note (Signed)
Nephrology decreased his allopurinol to '150mg'$ . Continue to monitor.

## 2022-08-24 NOTE — Progress Notes (Signed)
BP (!) 146/70   Pulse 66   Temp 98.3 F (36.8 C)   Ht _0  (1.702 m)   Wt 232 lb 11.2 oz (105.6 kg)   SpO2 99%   BMI 36.45 kg/m    Subjective:    Patient ID: Ricky Melton, male    DOB: Jul 22, 1951, 71 y.o.   MRN: [id number]  HPI: Ricky Melton is a 71 y.o. male  Chief Complaint  Patient presents with   Hypertension   Cough    Patient says he has had a cough for a little over a week now. Patient says he has been taking Coricidin and Robitussin. Patient says he feel it helps only a little. Patient says it is worse at night.     HYPERTENSION  Hypertension status: uncontrolled  Satisfied with current treatment? yes Duration of hypertension: chronic BP monitoring frequency:  not checking BP medication side effects:  no Medication compliance: excellent compliance Aspirin: no Recurrent headaches: no Visual changes: no Palpitations: no Dyspnea: no Chest pain: no Lower extremity edema: no Dizzy/lightheaded: no  UPPER RESPIRATORY TRACT INFECTION Duration: about a week Worst symptom: cough Fever: no Cough: yes Shortness of breath: no Wheezing: yes Chest pain: no Chest tightness: no Chest congestion: no Nasal congestion: no Runny nose: yes Post nasal drip: no Sneezing: no Sore throat: no Swollen glands: no Sinus pressure: no Headache: no Face pain: no Toothache: no Ear pain: no  Ear pressure: no  Eyes red/itching:no Eye drainage/crusting: no  Vomiting: no Rash: no Fatigue: no Sick contacts: no Strep contacts: no  Context: better Recurrent sinusitis: no Relief with OTC cold/cough medications: no  Treatments attempted: none    Relevant past medical, surgical, family and social history reviewed and updated as indicated. Interim medical history since our last visit reviewed. Allergies and medications reviewed and updated.  Review of Systems  Constitutional: Negative.   HENT:  Negative for congestion, dental problem, drooling, ear discharge, ear  pain, facial swelling, hearing loss, mouth sores, nosebleeds, postnasal drip, rhinorrhea, sinus pressure, sinus pain, sneezing, sore throat, tinnitus, trouble swallowing and voice change.   Respiratory:  Positive for cough and wheezing. Negative for apnea, choking, chest tightness, shortness of breath and stridor.   Cardiovascular: Negative.   Gastrointestinal: Negative.   Musculoskeletal: Negative.   Psychiatric/Behavioral: Negative.      Per HPI unless specifically indicated above     Objective:    BP (!) 146/70   Pulse 66   Temp 98.3 F (36.8 C)   Ht _1  (1.702 m)   Wt 232 lb 11.2 oz (105.6 kg)   SpO2 99%   BMI 36.45 kg/m   Wt Readings from Last 3 Encounters:  08/24/22 232 lb 11.2 oz (105.6 kg)  07/13/22 237 lb 3.2 oz (107.6 kg)  04/27/22 228 lb 14.4 oz (103.8 kg)    Physical Exam Vitals and nursing note reviewed.  Constitutional:      General: He is not in acute distress.    Appearance: Normal appearance. He is obese. He is not ill-appearing, toxic-appearing or diaphoretic.  HENT:     Head: Normocephalic and atraumatic.     Right Ear: External ear normal.     Left Ear: External ear normal.     Nose: Nose normal.     Mouth/Throat:     Mouth: Mucous membranes are moist.     Pharynx: Oropharynx is clear.  Eyes:     General: No scleral icterus.  Right eye: No discharge.        Left eye: No discharge.     Extraocular Movements: Extraocular movements intact.     Conjunctiva/sclera: Conjunctivae normal.     Pupils: Pupils are equal, round, and reactive to light.  Cardiovascular:     Rate and Rhythm: Normal rate and regular rhythm.     Pulses: Normal pulses.     Heart sounds: Normal heart sounds. No murmur heard.    No friction rub. No gallop.  Pulmonary:     Effort: Pulmonary effort is normal. No respiratory distress.     Breath sounds: Normal breath sounds. No stridor. No wheezing, rhonchi or rales.  Chest:     Chest wall: No tenderness.  Musculoskeletal:         General: Normal range of motion.     Cervical back: Normal range of motion and neck supple.  Skin:    General: Skin is warm and dry.     Capillary Refill: Capillary refill takes less than 2 seconds.     Coloration: Skin is not jaundiced or pale.     Findings: No bruising, erythema, lesion or rash.  Neurological:     General: No focal deficit present.     Mental Status: He is alert and oriented to person, place, and time. Mental status is at baseline.  Psychiatric:        Mood and Affect: Mood normal.        Behavior: Behavior normal.        Thought Content: Thought content normal.        Judgment: Judgment normal.     Results for orders placed or performed in visit on 07/13/22  Microscopic Examination   Urine  Result Value Ref Range   WBC, UA None seen 0 - 5 /hpf   RBC, Urine 3-10 (A) 0 - 2 /hpf   Epithelial Cells (non renal) None seen 0 - 10 /hpf   Bacteria, UA None seen None seen/Few  Comprehensive metabolic panel  Result Value Ref Range   Glucose 90 70 - 99 mg/dL   BUN 21 8 - 27 mg/dL   Creatinine, Ser 1.39 (H) 0.76 - 1.27 mg/dL   eGFR 54 (L) >59 mL/min/1.73   BUN/Creatinine Ratio 15 10 - 24   Sodium 141 134 - 144 mmol/L   Potassium 4.5 3.5 - 5.2 mmol/L   Chloride 107 (H) 96 - 106 mmol/L   CO2 21 20 - 29 mmol/L   Calcium 9.7 8.6 - 10.2 mg/dL   Total Protein 6.9 6.0 - 8.5 g/dL   Albumin 4.5 3.8 - 4.8 g/dL   Globulin, Total 2.4 1.5 - 4.5 g/dL   Albumin/Globulin Ratio 1.9 1.2 - 2.2   Bilirubin Total 0.4 0.0 - 1.2 mg/dL   Alkaline Phosphatase 58 44 - 121 IU/L   AST 20 0 - 40 IU/L   ALT 15 0 - 44 IU/L  CBC with Differential/Platelet  Result Value Ref Range   WBC 6.5 3.4 - 10.8 x10E3/uL   RBC 5.25 4.14 - 5.80 x10E6/uL   Hemoglobin 14.9 13.0 - 17.7 g/dL   Hematocrit 46.1 37.5 - 51.0 %   MCV 88 79 - 97 fL   MCH 28.4 26.6 - 33.0 pg   MCHC 32.3 31.5 - 35.7 g/dL   RDW 13.7 11.6 - 15.4 %   Platelets 225 150 - 450 x10E3/uL   Neutrophils 56 Not Estab. %   Lymphs  32 Not Estab. %   Monocytes  8 Not Estab. %   Eos 3 Not Estab. %   Basos 1 Not Estab. %   Neutrophils Absolute 3.7 1.4 - 7.0 x10E3/uL   Lymphocytes Absolute 2.1 0.7 - 3.1 x10E3/uL   Monocytes Absolute 0.5 0.1 - 0.9 x10E3/uL   EOS (ABSOLUTE) 0.2 0.0 - 0.4 x10E3/uL   Basophils Absolute 0.1 0.0 - 0.2 x10E3/uL   Immature Granulocytes 0 Not Estab. %   Immature Grans (Abs) 0.0 0.0 - 0.1 x10E3/uL  Lipid Panel w/o Chol/HDL Ratio  Result Value Ref Range   Cholesterol, Total 127 100 - 199 mg/dL   Triglycerides 103 0 - 149 mg/dL   HDL 43 >39 mg/dL   VLDL Cholesterol Cal 19 5 - 40 mg/dL   LDL Chol Calc (NIH) 65 0 - 99 mg/dL  PSA  Result Value Ref Range   Prostate Specific Ag, Serum 1.8 0.0 - 4.0 ng/mL  TSH  Result Value Ref Range   TSH 2.440 0.450 - 4.500 uIU/mL  Urinalysis, Routine w reflex microscopic  Result Value Ref Range   Specific Gravity, UA 1.015 1.005 - 1.030   pH, UA 6.0 5.0 - 7.5   Color, UA Yellow Yellow   Appearance Ur Clear Clear   Leukocytes,UA Negative Negative   Protein,UA 3+ (A) Negative/Trace   Glucose, UA Negative Negative   Ketones, UA Negative Negative   RBC, UA 2+ (A) Negative   Bilirubin, UA Negative Negative   Urobilinogen, Ur 0.2 0.2 - 1.0 mg/dL   Nitrite, UA Negative Negative   Microscopic Examination See below:   Microalbumin, Urine Waived  Result Value Ref Range   Microalb, Ur Waived 150 (H) 0 - 19 mg/L   Creatinine, Urine Waived 50 10 - 300 mg/dL   Microalb/Creat Ratio >300 (H) <30 mg/g  Bayer DCA Hb A1c Waived  Result Value Ref Range   HB A1C (BAYER DCA - WAIVED) 5.7 (H) 4.8 - 5.6 %  Uric acid  Result Value Ref Range   Uric Acid 5.5 3.8 - 8.4 mg/dL      Assessment & Plan:   Problem List Items Addressed This Visit       Cardiovascular and Mediastinum   Essential hypertension - Primary    Still running high. Will increase his benazepril to 73m and recheck 1 month. Call with any concerns.       Relevant Medications   benazepril  (LOTENSIN) 20 MG tablet     Other   Gout    Nephrology decreased his allopurinol to 1533m Continue to monitor.       Relevant Medications   allopurinol (ZYLOPRIM) 300 MG tablet   Other Visit Diagnoses     Acute cough       Will treat with tessalon. Call with any concerns. Continue to monitor.        Follow up plan: Return in about 4 weeks (around 09/21/2022).

## 2022-08-24 NOTE — Telephone Encounter (Signed)
Patient states if there is not another medication he doesn't mind paying the $45 but wants to know why because his insurance normally covers all his medicines.

## 2022-08-24 NOTE — Telephone Encounter (Signed)
Left message for patient to give our office. Spoke with patient's pharmacy and was informed that Medicare does not cover any cough medicine.   OK for PEC/Nurse Triage to give note if patient calls back.

## 2022-08-24 NOTE — Telephone Encounter (Signed)
The patient saw his provider this morning and was prescribed benzonatate (TESSALON) 200 MG capsule. He states his insurance [facility] Medicare will not cover this medication and it would cost him $45. He wants to know if there is another medication his insurance will cover that will do the same thing. Please assist patient further

## 2022-08-24 NOTE — Telephone Encounter (Signed)
Pt called, advised message from [place], [place]. Advised pt he can try to get rx without insurance and recommended trying [facility]. Pulled up code and gave pt information. Pt requested that I sent benzonatate to [facility] so he can get it cheaper. Pt will call back if having any issues.

## 2022-08-24 NOTE — Telephone Encounter (Signed)
Entered in error

## 2022-08-24 NOTE — Telephone Encounter (Signed)
Sometimes medicare doesn't cover cough medicine. That may be what's happening here. I would advise him to ask his pharmacist if that's what's going on

## 2022-09-22 ENCOUNTER — Ambulatory Visit: Payer: Self-pay

## 2022-09-22 NOTE — Telephone Encounter (Signed)
Message from Loyal Callas sent at 09/22/2022 10:57 AM EST  Summary: congestion   Pt called saying he has congestion in his head and sneezing.  He wants to know if there is something he an take or can be sent to the pharmacy.  [facility]  CB#  [phone]          Chief Complaint: sneezing, nasal congestion Symptoms: occasional cough Frequency: Wednesday Pertinent Negatives: Patient denies fever, SOB, breathing difficulty Disposition: '[]'$ ED /'[]'$ Urgent Care (no appt availability in office) / '[]'$ Appointment(In office/virtual)/ '[]'$  [facility]/ '[x]'$ Home Care/ '[]'$ Refused Recommended Disposition /'[]'$ [facility]/ '[]'$  Follow-up with PCP Additional Notes:    Reason for Disposition  Common cold with no complications  Answer Assessment - Initial Assessment Questions 1. ONSET: "When did the nasal discharge start?"      Wednesday 2. AMOUNT: "How much discharge is there?"      Clear and occasionally yellow 3. COUGH: "Do you have a cough?" If Yes, ask: "Describe the color of your sputum" (clear, white, yellow, green)     no 4. RESPIRATORY DISTRESS: "Describe your breathing."      normal 5. FEVER: "Do you have a fever?" If Yes, ask: "What is your temperature, how was it measured, and when did it start?"     no 6. SEVERITY: "Overall, how bad are you feeling right now?" (e.g., doesn't interfere with normal activities, staying home from school/work, staying in bed)      Resting in house in Wednesday  7. OTHER SYMPTOMS: "Do you have any other symptoms?" (e.g., sore throat, earache, wheezing, vomiting)     Sinus congestion 8. PREGNANCY: "Is there any chance you are pregnant?" "When was your last menstrual period?"     N/a  Protocols used: Common Cold-A-AH

## 2022-09-28 ENCOUNTER — Encounter: Payer: Self-pay | Admitting: Family Medicine

## 2022-09-28 ENCOUNTER — Ambulatory Visit (INDEPENDENT_AMBULATORY_CARE_PROVIDER_SITE_OTHER): Payer: Medicare Other | Admitting: Family Medicine

## 2022-09-28 VITALS — BP 146/62 | HR 65 | Temp 98.3°F | Ht 67.0 in | Wt 233.6 lb

## 2022-09-28 DIAGNOSIS — I10 Essential (primary) hypertension: Secondary | ICD-10-CM | POA: Diagnosis not present

## 2022-09-28 DIAGNOSIS — E039 Hypothyroidism, unspecified: Secondary | ICD-10-CM | POA: Diagnosis not present

## 2022-09-28 MED ORDER — BENAZEPRIL HCL 40 MG PO TABS: 40.0000 mg | ORAL_TABLET | Freq: Every day | ORAL | 1 refills | Status: DC

## 2022-09-28 NOTE — Assessment & Plan Note (Signed)
Running well at home, but high here. Continue current regimen. Continue to monitor. Refills given. Labs drawn today.

## 2022-09-28 NOTE — Progress Notes (Signed)
BP (!) 146/62   Pulse 65   Temp 98.3 F (36.8 C) (Oral)   Ht '5\' 7"'$  (1.702 m)   Wt 233 lb 9.6 oz (106 kg)   SpO2 99%   BMI 36.59 kg/m    Subjective:    Patient ID: Ricky Melton, male    DOB: 1950/11/19, 72 y.o.   MRN: [id number]  HPI: Ricky Melton is a 72 y.o. male  Chief Complaint  Patient presents with   Hypertension    Patient says he ran out of the half tablet and says he had a prescription of 40 mg and says he had been taking those.    HYPERTENSION- has been taking '40mg'$  of benazepril for about a week.   Hypertension status: controlled  Satisfied with current treatment? yes Duration of hypertension: chronic BP monitoring frequency:  a few times a week BP range: 130s/60s-70s BP medication side effects:  no Medication compliance: excellent compliance Aspirin: no Recurrent headaches: no Visual changes: no Palpitations: no Dyspnea: no Chest pain: no Lower extremity edema: no Dizzy/lightheaded: no  HYPOTHYROIDISM Thyroid control status:controlled Satisfied with current treatment? yes Medication side effects: no Medication compliance: excellent compliance Recent dose adjustment:no Fatigue: no Cold intolerance: no Heat intolerance: no Weight gain: no Weight loss: no Constipation: no Diarrhea/loose stools: no Palpitations: no Lower extremity edema: no Anxiety/depressed mood: no   Relevant past medical, surgical, family and social history reviewed and updated as indicated. Interim medical history since our last visit reviewed. Allergies and medications reviewed and updated.  Review of Systems  Constitutional: Negative.   Respiratory: Negative.    Cardiovascular: Negative.   Gastrointestinal: Negative.   Musculoskeletal: Negative.   Psychiatric/Behavioral: Negative.      Per HPI unless specifically indicated above     Objective:    BP (!) 146/62   Pulse 65   Temp 98.3 F (36.8 C) (Oral)   Ht '5\' 7"'$  (1.702 m)   Wt 233 lb 9.6 oz (106 kg)    SpO2 99%   BMI 36.59 kg/m   Wt Readings from Last 3 Encounters:  09/28/22 233 lb 9.6 oz (106 kg)  08/24/22 232 lb 11.2 oz (105.6 kg)  07/13/22 237 lb 3.2 oz (107.6 kg)    Physical Exam Vitals and nursing note reviewed.  Constitutional:      General: He is not in acute distress.    Appearance: Normal appearance. He is not ill-appearing, toxic-appearing or diaphoretic.  HENT:     Head: Normocephalic and atraumatic.     Right Ear: External ear normal.     Left Ear: External ear normal.     Nose: Nose normal.     Mouth/Throat:     Mouth: Mucous membranes are moist.     Pharynx: Oropharynx is clear.  Eyes:     General: No scleral icterus.       Right eye: No discharge.        Left eye: No discharge.     Extraocular Movements: Extraocular movements intact.     Conjunctiva/sclera: Conjunctivae normal.     Pupils: Pupils are equal, round, and reactive to light.  Cardiovascular:     Rate and Rhythm: Normal rate and regular rhythm.     Pulses: Normal pulses.     Heart sounds: Normal heart sounds. No murmur heard.    No friction rub. No gallop.  Pulmonary:     Effort: Pulmonary effort is normal. No respiratory distress.     Breath sounds: Normal breath  sounds. No stridor. No wheezing, rhonchi or rales.  Chest:     Chest wall: No tenderness.  Musculoskeletal:        General: Normal range of motion.     Cervical back: Normal range of motion and neck supple.  Skin:    General: Skin is warm and dry.     Capillary Refill: Capillary refill takes less than 2 seconds.     Coloration: Skin is not jaundiced or pale.     Findings: No bruising, erythema, lesion or rash.  Neurological:     General: No focal deficit present.     Mental Status: He is alert and oriented to person, place, and time. Mental status is at baseline.  Psychiatric:        Mood and Affect: Mood normal.        Behavior: Behavior normal.        Thought Content: Thought content normal.        Judgment: Judgment normal.      Results for orders placed or performed in visit on 07/13/22  Microscopic Examination   Urine  Result Value Ref Range   WBC, UA None seen 0 - 5 /hpf   RBC, Urine 3-10 (A) 0 - 2 /hpf   Epithelial Cells (non renal) None seen 0 - 10 /hpf   Bacteria, UA None seen None seen/Few  Comprehensive metabolic panel  Result Value Ref Range   Glucose 90 70 - 99 mg/dL   BUN 21 8 - 27 mg/dL   Creatinine, Ser 1.39 (H) 0.76 - 1.27 mg/dL   eGFR 54 (L) >59 mL/min/1.73   BUN/Creatinine Ratio 15 10 - 24   Sodium 141 134 - 144 mmol/L   Potassium 4.5 3.5 - 5.2 mmol/L   Chloride 107 (H) 96 - 106 mmol/L   CO2 21 20 - 29 mmol/L   Calcium 9.7 8.6 - 10.2 mg/dL   Total Protein 6.9 6.0 - 8.5 g/dL   Albumin 4.5 3.8 - 4.8 g/dL   Globulin, Total 2.4 1.5 - 4.5 g/dL   Albumin/Globulin Ratio 1.9 1.2 - 2.2   Bilirubin Total 0.4 0.0 - 1.2 mg/dL   Alkaline Phosphatase 58 44 - 121 IU/L   AST 20 0 - 40 IU/L   ALT 15 0 - 44 IU/L  CBC with Differential/Platelet  Result Value Ref Range   WBC 6.5 3.4 - 10.8 x10E3/uL   RBC 5.25 4.14 - 5.80 x10E6/uL   Hemoglobin 14.9 13.0 - 17.7 g/dL   Hematocrit 46.1 37.5 - 51.0 %   MCV 88 79 - 97 fL   MCH 28.4 26.6 - 33.0 pg   MCHC 32.3 31.5 - 35.7 g/dL   RDW 13.7 11.6 - 15.4 %   Platelets 225 150 - 450 x10E3/uL   Neutrophils 56 Not Estab. %   Lymphs 32 Not Estab. %   Monocytes 8 Not Estab. %   Eos 3 Not Estab. %   Basos 1 Not Estab. %   Neutrophils Absolute 3.7 1.4 - 7.0 x10E3/uL   Lymphocytes Absolute 2.1 0.7 - 3.1 x10E3/uL   Monocytes Absolute 0.5 0.1 - 0.9 x10E3/uL   EOS (ABSOLUTE) 0.2 0.0 - 0.4 x10E3/uL   Basophils Absolute 0.1 0.0 - 0.2 x10E3/uL   Immature Granulocytes 0 Not Estab. %   Immature Grans (Abs) 0.0 0.0 - 0.1 x10E3/uL  Lipid Panel w/o Chol/HDL Ratio  Result Value Ref Range   Cholesterol, Total 127 100 - 199 mg/dL   Triglycerides 103 0 - 149  mg/dL   HDL 43 >39 mg/dL   VLDL Cholesterol Cal 19 5 - 40 mg/dL   LDL Chol Calc (NIH) 65 0 - 99 mg/dL  PSA   Result Value Ref Range   Prostate Specific Ag, Serum 1.8 0.0 - 4.0 ng/mL  TSH  Result Value Ref Range   TSH 2.440 0.450 - 4.500 uIU/mL  Urinalysis, Routine w reflex microscopic  Result Value Ref Range   Specific Gravity, UA 1.015 1.005 - 1.030   pH, UA 6.0 5.0 - 7.5   Color, UA Yellow Yellow   Appearance Ur Clear Clear   Leukocytes,UA Negative Negative   Protein,UA 3+ (A) Negative/Trace   Glucose, UA Negative Negative   Ketones, UA Negative Negative   RBC, UA 2+ (A) Negative   Bilirubin, UA Negative Negative   Urobilinogen, Ur 0.2 0.2 - 1.0 mg/dL   Nitrite, UA Negative Negative   Microscopic Examination See below:   Microalbumin, Urine Waived  Result Value Ref Range   Microalb, Ur Waived 150 (H) 0 - 19 mg/L   Creatinine, Urine Waived 50 10 - 300 mg/dL   Microalb/Creat Ratio >300 (H) <30 mg/g  Bayer DCA Hb A1c Waived  Result Value Ref Range   HB A1C (BAYER DCA - WAIVED) 5.7 (H) 4.8 - 5.6 %  Uric acid  Result Value Ref Range   Uric Acid 5.5 3.8 - 8.4 mg/dL      Assessment & Plan:   Problem List Items Addressed This Visit       Cardiovascular and Mediastinum   Essential hypertension    Running well at home, but high here. Continue current regimen. Continue to monitor. Refills given. Labs drawn today.      Relevant Medications   benazepril (LOTENSIN) 40 MG tablet   Other Relevant Orders   Basic metabolic panel     Endocrine   Hypothyroidism - Primary    Rechecking labs today. Await results. Treat as needed.       Relevant Orders   TSH     Follow up plan: Return May for 6 months follow up.

## 2022-09-28 NOTE — Assessment & Plan Note (Signed)
Rechecking labs today. Await results. Treat as needed.  °

## 2022-09-29 ENCOUNTER — Other Ambulatory Visit: Payer: Self-pay | Admitting: Family Medicine

## 2022-09-29 DIAGNOSIS — E039 Hypothyroidism, unspecified: Secondary | ICD-10-CM

## 2022-09-29 LAB — BASIC METABOLIC PANEL
BUN/Creatinine Ratio: 15 (ref 10–24)
BUN: 21 mg/dL (ref 8–27)
CO2: 21 mmol/L (ref 20–29)
Calcium: 9.4 mg/dL (ref 8.6–10.2)
Chloride: 106 mmol/L (ref 96–106)
Creatinine, Ser: 1.44 mg/dL — ABNORMAL HIGH (ref 0.76–1.27)
Glucose: 103 mg/dL — ABNORMAL HIGH (ref 70–99)
Potassium: 4.1 mmol/L (ref 3.5–5.2)
Sodium: 140 mmol/L (ref 134–144)
eGFR: 52 mL/min/{1.73_m2} — ABNORMAL LOW (ref >59)

## 2022-09-29 LAB — TSH: TSH: 2.79 u[IU]/mL (ref 0.450–4.500)

## 2022-09-29 MED ORDER — LEVOTHYROXINE SODIUM 50 MCG PO TABS: 50.0000 ug | ORAL_TABLET | Freq: Every day | ORAL | 3 refills | Status: DC

## 2022-10-06 ENCOUNTER — Telehealth: Payer: Self-pay | Admitting: Family Medicine

## 2022-10-06 NOTE — Telephone Encounter (Signed)
Copied from [place] [id number]. Topic: General - Other >> Oct 06, 2022  2:01 PM Tiffany B wrote: Patient has been summoned for jury duty and would like PCP to fill out a form excusing him, form is due prior to  10/27/2022. Please advise if PCP will fill out form

## 2022-10-08 NOTE — Telephone Encounter (Signed)
Please find out why he needs to be excused. Thanks. Does not need appt

## 2022-10-09 ENCOUNTER — Encounter: Payer: Self-pay | Admitting: Family Medicine

## 2022-10-09 NOTE — Telephone Encounter (Signed)
Letter on chart.

## 2022-10-09 NOTE — Telephone Encounter (Signed)
Patient called in asking to be excused from jury duty because of his heart problem and he can't hear well and BP issues.

## 2022-10-10 ENCOUNTER — Telehealth: Payer: Self-pay | Admitting: Family Medicine

## 2022-10-10 NOTE — Telephone Encounter (Signed)
Spoke with patient and notified him that the letter was placed up front for pick up. Patient says he has a paper that needs to be completed as well for [employer] and patient says he will drop it off for provider review after lunch around 1 pm.

## 2022-10-10 NOTE — Telephone Encounter (Signed)
Pt dropped off paperwork for jury duty, stated that he spoke with someone and provider would fill it out.  Put in provider's folder.

## 2022-10-11 NOTE — Telephone Encounter (Signed)
Called pt to notify him that the provider has completed the paperwork that was requested.

## 2022-10-19 ENCOUNTER — Encounter: Payer: Self-pay | Admitting: Cardiovascular Disease

## 2022-10-19 ENCOUNTER — Ambulatory Visit: Payer: Medicare Other | Admitting: Cardiovascular Disease

## 2022-10-19 VITALS — BP 160/78 | HR 68 | Ht 67.0 in | Wt 236.6 lb

## 2022-10-19 DIAGNOSIS — I10 Essential (primary) hypertension: Secondary | ICD-10-CM

## 2022-10-19 DIAGNOSIS — E782 Mixed hyperlipidemia: Secondary | ICD-10-CM | POA: Diagnosis not present

## 2022-10-19 DIAGNOSIS — I25738 Atherosclerosis of nonautologous biological coronary artery bypass graft(s) with other forms of angina pectoris: Secondary | ICD-10-CM | POA: Diagnosis not present

## 2022-10-19 MED ORDER — CARVEDILOL 25 MG PO TABS: 25.0000 mg | ORAL_TABLET | Freq: Two times a day (BID) | ORAL | 2 refills | Status: DC

## 2022-10-19 NOTE — Progress Notes (Signed)
Cardiology Office Note   Date:  10/19/2022   ID:  LACY TAGLIERI, DOB Mar 05, 1951, MRN [id number]  PCP:  Valerie Roys, DO  Cardiologist:  Neoma Laming, MD      History of Present Illness: Ricky Melton is a 72 y.o. male who presents for  Chief Complaint  Patient presents with   Follow-up    Breathing Problem He complains of difficulty breathing. This is a chronic problem. The problem has been resolved. Associated symptoms include chest pain.  Chest Pain  This is a chronic problem. The problem has been resolved.      Past Medical History:  Diagnosis Date   Benign hematuria    CAD (coronary artery disease)    Chronic kidney disease    Hyperlipidemia    Hypertension      Past Surgical History:  Procedure Laterality Date   cardiac stents     x2   COLONOSCOPY WITH PROPOFOL N/A 05/10/2018   Procedure: COLONOSCOPY WITH PROPOFOL;  Surgeon: Jonathon Bellows, MD;  Location: [facility] ENDOSCOPY;  Service: Gastroenterology;  Laterality: N/A;   EYE SURGERY Right 2021   cataract sx in [place]      Current Outpatient Medications  Medication Sig Dispense Refill   allopurinol (ZYLOPRIM) 300 MG tablet Take 0.5 tablets (150 mg total) by mouth daily. 90 tablet 1   amLODipine (NORVASC) 10 MG tablet Take 1 tablet (10 mg total) by mouth every evening. 90 tablet 1   aspirin 81 MG tablet Take 81 mg by mouth daily.     benazepril (LOTENSIN) 40 MG tablet Take 1 tablet (40 mg total) by mouth daily. 90 tablet 1   clopidogrel (PLAVIX) 75 MG tablet Take 1 tablet (75 mg total) by mouth daily. 90 tablet 1   diclofenac Sodium (VOLTAREN) 1 % GEL Apply 4 g topically 4 (four) times daily. 100 g 3   fexofenadine (ALLEGRA) 180 MG tablet Take by mouth.     fluticasone (FLONASE) 50 MCG/ACT nasal spray Place 2 sprays into both nostrils daily. 16 g 6   levothyroxine (SYNTHROID) 50 MCG tablet Take 1 tablet (50 mcg total) by mouth daily. 90 tablet 3   rosuvastatin (CRESTOR) 40 MG tablet Take 1 tablet (40  mg total) by mouth daily. 90 tablet 1   benzonatate (TESSALON) 200 MG capsule Take 1 capsule (200 mg total) by mouth 2 (two) times daily as needed for cough. (Patient not taking: Reported on 10/19/2022) 60 capsule 0   carvedilol (COREG) 25 MG tablet Take 1 tablet (25 mg total) by mouth 2 (two) times daily with a meal. 60 tablet 2   meclizine (ANTIVERT) 25 MG tablet Take 1 tablet (25 mg total) by mouth 3 (three) times daily as needed for dizziness. (Patient not taking: Reported on 10/19/2022) 30 tablet 0   No current facility-administered medications for this visit.    Allergies:   Hydrochlorothiazide    Social History:   reports that he has never smoked. He quit smokeless tobacco use about 12 years ago.  His smokeless tobacco use included chew. He reports that he does not drink alcohol and does not use drugs.   Family History:  family history includes Diabetes in his sister and son; Hypertension in his mother.    ROS:     Review of Systems  Constitutional: Negative.   HENT: Negative.    Eyes: Negative.   Respiratory: Negative.    Cardiovascular:  Positive for chest pain.  Gastrointestinal: Negative.   Genitourinary: Negative.  Musculoskeletal: Negative.   Skin: Negative.   Neurological: Negative.   Endo/Heme/Allergies: Negative.   Psychiatric/Behavioral: Negative.    All other systems reviewed and are negative.     All other systems are reviewed and negative.    PHYSICAL EXAM: VS:  BP (!) 160/78 (BP Location: Left Arm, Patient Position: Sitting, Cuff Size: Normal)   Pulse 68   Ht '5\' 7"'$  (1.702 m)   Wt 236 lb 9.6 oz (107.3 kg)   SpO2 98%   BMI 37.06 kg/m  , BMI Body mass index is 37.06 kg/m. Last weight:  Wt Readings from Last 3 Encounters:  10/19/22 236 lb 9.6 oz (107.3 kg)  09/28/22 233 lb 9.6 oz (106 kg)  08/24/22 232 lb 11.2 oz (105.6 kg)     Physical Exam Vitals reviewed.  Constitutional:      Appearance: Normal appearance. He is normal weight.  HENT:      Head: Normocephalic.     Nose: Nose normal.     Mouth/Throat:     Mouth: Mucous membranes are moist.  Eyes:     Pupils: Pupils are equal, round, and reactive to light.  Cardiovascular:     Rate and Rhythm: Normal rate and regular rhythm.     Pulses: Normal pulses.     Heart sounds: Normal heart sounds.  Pulmonary:     Effort: Pulmonary effort is normal.  Abdominal:     General: Abdomen is flat. Bowel sounds are normal.  Musculoskeletal:        General: Normal range of motion.     Cervical back: Normal range of motion.  Skin:    General: Skin is warm.  Neurological:     General: No focal deficit present.     Mental Status: He is alert.  Psychiatric:        Mood and Affect: Mood normal.       EKG:   Recent Labs: 07/13/2022: ALT 15; Hemoglobin 14.9; Platelets 225 09/28/2022: BUN 21; Creatinine, Ser 1.44; Potassium 4.1; Sodium 140; TSH 2.790    Lipid Panel    Component Value Date/Time   CHOL 127 07/13/2022 0832   CHOL 98 05/14/2019 1300   TRIG 103 07/13/2022 0832   TRIG 205 (H) 05/14/2019 1300   HDL 43 07/13/2022 0832   CHOLHDL 2.9 05/24/2021 0823   VLDL 41 (H) 05/14/2019 1300   LDLCALC 65 07/13/2022 0832      Other studies Reviewed: Additional studies/ records that were reviewed today include:  Review of the above records demonstrates:   REASON FOR VISIT  Referred by Dr.Shareece Bultman Humphrey Rolls.        TESTS  Imaging: Computed Tomographic Angiography:  Cardiac multidetector CT was performed paying particular attention to the coronary arteries for the diagnosis of: CAD. Diagnostic Drugs:  Administered iohexol (Omnipaque) through an antecubital vein and images from the examination were analyzed for the presence and extent of coronary artery disease, using 3D image processing software. 100 mL of non-ionic contrast (Omnipaque) was used.        ASSESSMENT   The left main artery was abnormal:1  The proximal LAD artery are abnormal:2  The mid-LAD artery was abnormal:2.  Stent  The distal LAD artery was abnormal:2  The proximal circumflex artery was abnormal:1  The distal circumflex artery was abnormal:1  The first obtuse marginal branch artery (OM-1) was abnormal:1  The proximal right coronary artery (RCA) was abnormal:1  The mid right coronary artery (RCA) was abnormal:1  The distal right coronary artery (RCA)  was abnormal  The posterior descending coronary arteries were abnormal:2     Postero-Lateral Branch: 2     TEST CONCLUSIONS  Quality of study: Good.  1-Calcium score:1836.7  2-Right dominant system.  3-RCA distally in PDA and PLV calcified. Stent in mid LAD has no significant restenosis but has mild disease prior to stent. LCX has no significant disease.  Neoma Laming MD  Electronically signed by: Neoma Laming     Date: 07/31/2018 09:57     No data to display          REASON FOR VISIT  Visit for: Echocardiogram/I 35.0  Sex:   Male         wt=226    lbs.  BP=140/70  Height=67    inches.        TESTS  Imaging: Echocardiogram:  An echocardiogram in (2-d) mode was performed. Pulmonic Valve has Mild Regurgitation. Mitral Valve has Trace Regurgitation. Tricuspid Valve has Trace Regurgitation.     ASSESSMENT  Suboptimal study due to poor windows.  Normal chamber sizes.  Normal left ventricular systolic function.  Mild left ventricular hypertrophy with GRADE 1 (relaxation abnormality) diastolic dysfunction.  Normal right ventricular systolic function.  Normal right ventricular diastolic function.  Normal left ventricular wall motion.  Normal right ventricular wall motion.  Mild pulmonary regurgitation.  Trace tricuspid regurgitation.  Normal pulmonary artery pressure.  Trace mitral regurgitation.  No pericardial effusion.  No LVH.     THERAPY   Referring physician: Dionisio David  Sonographer: Neoma Laming.  Neoma Laming MD  Electronically signed by: Neoma Laming     Date: 12/19/2021 13:25   ASSESSMENT AND  PLAN:    ICD-10-CM   1. 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 Essential hypertension  I10 carvedilol (COREG) 25 MG tablet    3. Mixed hyperlipidemia  E78.2     4. Morbid obesity (HCC)  E66.01        Problem List Items Addressed This Visit       Cardiovascular and Mediastinum   CAD (coronary artery disease) - Primary   Relevant Medications   carvedilol (COREG) 25 MG tablet   Essential hypertension    Patient has repeat BP 170/90, and will increase coreg to 25 bid and benazapril already increased to 40 daily.      Relevant Medications   carvedilol (COREG) 25 MG tablet     Other   Hyperlipidemia   Relevant Medications   carvedilol (COREG) 25 MG tablet   Morbid obesity (HCC)       Disposition:   Return in about 4 weeks (around 11/16/2022).    Total time spent: 30 minutes  Signed,  Neoma Laming, MD  10/19/2022 9:48 AM    [place]

## 2022-10-19 NOTE — Patient Instructions (Signed)
Increase coreg to 25 mg twice a day

## 2022-10-19 NOTE — Assessment & Plan Note (Signed)
Patient has repeat BP 170/90, and will increase coreg to 25 bid and benazapril already increased to 40 daily.

## 2022-10-23 ENCOUNTER — Encounter: Payer: Self-pay | Admitting: Family Medicine

## 2022-10-25 DIAGNOSIS — H25813 Combined forms of age-related cataract, bilateral: Secondary | ICD-10-CM | POA: Diagnosis not present

## 2022-10-25 DIAGNOSIS — H35341 Macular cyst, hole, or pseudohole, right eye: Secondary | ICD-10-CM | POA: Diagnosis not present

## 2022-11-16 ENCOUNTER — Encounter: Payer: Self-pay | Admitting: Cardiovascular Disease

## 2022-11-16 ENCOUNTER — Other Ambulatory Visit: Payer: Self-pay | Admitting: Family Medicine

## 2022-11-16 ENCOUNTER — Ambulatory Visit: Payer: Medicare Other | Admitting: Cardiovascular Disease

## 2022-11-16 VITALS — BP 114/68 | HR 77 | Ht 67.0 in | Wt 237.0 lb

## 2022-11-16 DIAGNOSIS — E782 Mixed hyperlipidemia: Secondary | ICD-10-CM | POA: Diagnosis not present

## 2022-11-16 DIAGNOSIS — I10 Essential (primary) hypertension: Secondary | ICD-10-CM | POA: Diagnosis not present

## 2022-11-16 DIAGNOSIS — I2510 Atherosclerotic heart disease of native coronary artery without angina pectoris: Secondary | ICD-10-CM

## 2022-11-16 DIAGNOSIS — E039 Hypothyroidism, unspecified: Secondary | ICD-10-CM

## 2022-11-16 NOTE — Assessment & Plan Note (Addendum)
B/p well controlled. Continue to monitor b/p at home. Continue same medications.

## 2022-11-16 NOTE — Patient Instructions (Signed)
Rise slowing when standing to prevent dizziness.

## 2022-11-16 NOTE — Progress Notes (Signed)
Cardiology Office Note   Date:  11/16/2022   ID:  MEMPHYS HENSARLING, DOB 01-21-1951, MRN [id number]  PCP:  Valerie Roys, DO  Cardiologist:  Neoma Laming, MD      History of Present Illness: Ricky Melton is a 72 y.o. male who presents for  Chief Complaint  Patient presents with   Follow-up    4 week follow up    Patient in office for one month follow up. Denies chest pain, shortness of breath. Complains of lightheadedness upon standing. Right side of lower lip tingling occasionally since increasing carvedilol.  Dizziness This is a new problem. The current episode started 1 to 4 weeks ago. The problem occurs intermittently. The problem has been unchanged. The symptoms are aggravated by standing. He has tried nothing for the symptoms.      Past Medical History:  Diagnosis Date   Benign hematuria    CAD (coronary artery disease)    Chronic kidney disease    Hyperlipidemia    Hypertension      Past Surgical History:  Procedure Laterality Date   cardiac stents     x2   COLONOSCOPY WITH PROPOFOL N/A 05/10/2018   Procedure: COLONOSCOPY WITH PROPOFOL;  Surgeon: Jonathon Bellows, MD;  Location: [facility] END[facility]COPY;  Service: Gastroenterology;  Laterality: N/A;   EYE SURGERY Right 2021   cataract sx in [place]      Current Outpatient Medications  Medication Sig Dispense Refill   allopurinol (ZYLOPRIM) 300 MG tablet Take 0.5 tablets (150 mg total) by mouth daily. 90 tablet 1   amLODipine (NORVASC) 10 MG tablet Take 1 tablet (10 mg total) by mouth every evening. 90 tablet 1   aspirin 81 MG tablet Take 81 mg by mouth daily.     benazepril (LOTENSIN) 40 MG tablet Take 1 tablet (40 mg total) by mouth daily. 90 tablet 1   benzonatate (TESSALON) 200 MG capsule Take 1 capsule (200 mg total) by mouth 2 (two) times daily as needed for cough. 60 capsule 0   carvedilol (COREG) 25 MG tablet Take 1 tablet (25 mg total) by mouth 2 (two) times daily with a meal. 60 tablet 2   clopidogrel  (PLAVIX) 75 MG tablet Take 1 tablet (75 mg total) by mouth daily. 90 tablet 1   diclofenac Sodium (VOLTAREN) 1 % GEL Apply 4 g topically 4 (four) times daily. 100 g 3   fexofenadine (ALLEGRA) 180 MG tablet Take by mouth.     fluticasone (FLONASE) 50 MCG/ACT nasal spray Place 2 sprays into both nostrils daily. 16 g 6   levothyroxine (SYNTHROID) 50 MCG tablet Take 1 tablet (50 mcg total) by mouth daily. 90 tablet 3   meclizine (ANTIVERT) 25 MG tablet Take 1 tablet (25 mg total) by mouth 3 (three) times daily as needed for dizziness. 30 tablet 0   rosuvastatin (CRESTOR) 40 MG tablet Take 1 tablet (40 mg total) by mouth daily. 90 tablet 1   No current facility-administered medications for this visit.    Allergies:   Hydrochlorothiazide    Social History:   reports that he has never smoked. He quit smokeless tobacco use about 12 years ago.  His smokeless tobacco use included chew. He reports that he does not drink alcohol and does not use drugs.   Family History:  family history includes Diabetes in his sister and son; Hypertension in his mother.    ROS:     Review of Systems  Constitutional: Negative.   HENT:  Negative.    Eyes: Negative.   Respiratory: Negative.    Gastrointestinal: Negative.   Genitourinary: Negative.   Musculoskeletal: Negative.   Skin: Negative.   Neurological:  Positive for dizziness.  Endo/Heme/Allergies: Negative.   Psychiatric/Behavioral: Negative.    All other systems reviewed and are negative.   All other systems are reviewed and negative.   PHYSICAL EXAM: VS:  BP 114/68 (BP Location: Right Arm, Patient Position: Sitting)   Pulse 77   Ht '5\' 7"'$  (1.702 m)   Wt 237 lb (107.5 kg)   SpO2 96%   BMI 37.12 kg/m  , BMI Body mass index is 37.12 kg/m. Last weight:  Wt Readings from Last 3 Encounters:  11/16/22 237 lb (107.5 kg)  10/19/22 236 lb 9.6 oz (107.3 kg)  09/28/22 233 lb 9.6 oz (106 kg)   Physical Exam Vitals reviewed.  Constitutional:       Appearance: Normal appearance. He is normal weight.  HENT:     Head: Normocephalic.     Nose: Nose normal.     Mouth/Throat:     Mouth: Mucous membranes are moist.  Eyes:     Pupils: Pupils are equal, round, and reactive to light.  Cardiovascular:     Rate and Rhythm: Normal rate and regular rhythm.     Pulses: Normal pulses.     Heart sounds: Normal heart sounds.  Pulmonary:     Effort: Pulmonary effort is normal.  Abdominal:     General: Abdomen is flat. Bowel sounds are normal.  Musculoskeletal:        General: Normal range of motion.     Cervical back: Normal range of motion.  Skin:    General: Skin is warm.  Neurological:     General: No focal deficit present.     Mental Status: He is alert.  Psychiatric:        Mood and Affect: Mood normal.     EKG: none today  Recent Labs: 07/13/2022: ALT 15; Hemoglobin 14.9; Platelets 225 09/28/2022: BUN 21; Creatinine, Ser 1.44; Potassium 4.1; Sodium 140; TSH 2.790    Lipid Panel    Component Value Date/Time   CHOL 127 07/13/2022 0832   CHOL 98 05/14/2019 1300   TRIG 103 07/13/2022 0832   TRIG 205 (H) 05/14/2019 1300   HDL 43 07/13/2022 0832   CHOLHDL 2.9 05/24/2021 0823   VLDL 41 (H) 05/14/2019 1300   LDLCALC 65 07/13/2022 0832     ASSESSMENT AND PLAN:    ICD-10-CM   1. Essential hypertension  I10     2. Coronary artery disease involving native coronary artery of native heart without angina pectoris  I25.10     3. Mixed hyperlipidemia  E78.2        Problem List Items Addressed This Visit       Cardiovascular and Mediastinum   CAD (coronary artery disease)   Essential hypertension - Primary    B/p well controlled. Continue to monitor b/p at home. Continue same medications.         Other   Hyperlipidemia     Disposition:   Return in about 2 months (around 01/16/2023).    Total time spent: 30 minutes  Signed,  Neoma Laming, MD  11/16/2022 9:20 AM    [facility]

## 2022-11-28 ENCOUNTER — Other Ambulatory Visit: Payer: Self-pay | Admitting: Family Medicine

## 2022-11-28 DIAGNOSIS — I10 Essential (primary) hypertension: Secondary | ICD-10-CM

## 2022-11-29 NOTE — Telephone Encounter (Signed)
D/C 08/24/22 Requested Prescriptions  Refused Prescriptions Disp Refills   benazepril (LOTENSIN) 20 MG tablet [Pharmacy Med Name: Benazepril HCl 20 MG Oral Tablet] 90 tablet 3    Sig: TAKE 1 TABLET BY MOUTH DAILY     Cardiovascular:  ACE Inhibitors Failed - 11/28/2022 10:48 PM      Failed - Cr in normal range and within 180 days    Creatinine, Ser  Date Value Ref Range Status  09/28/2022 1.44 (H) 0.76 - 1.27 mg/dL Final         Passed - K in normal range and within 180 days    Potassium  Date Value Ref Range Status  09/28/2022 4.1 3.5 - 5.2 mmol/L Final         Passed - Patient is not pregnant      Passed - Last BP in normal range    BP Readings from Last 1 Encounters:  11/16/22 114/68         Passed - Valid encounter within last 6 months    Recent Outpatient Visits           2 months ago Hypothyroidism, unspecified type   [place], Henrietta P, DO   3 months ago Essential hypertension   [place] P, DO   4 months ago Routine general medical examination at a health care facility   [facility], [place] P, DO   7 months ago Abscess of groin, right   [place], Erin E, PA-C   7 months ago Abscess of groin, right   [place], Dani Gobble, PA-C       Future Appointments             In 2 months Dionisio David, MD [place]   In 4 months Wynetta Emery, Barb Merino, DO [place], [facility]

## 2022-11-30 DIAGNOSIS — H1811 Bullous keratopathy, right eye: Secondary | ICD-10-CM | POA: Diagnosis not present

## 2022-12-06 DIAGNOSIS — H1713 Central corneal opacity, bilateral: Secondary | ICD-10-CM | POA: Diagnosis not present

## 2022-12-18 DIAGNOSIS — R809 Proteinuria, unspecified: Secondary | ICD-10-CM | POA: Diagnosis not present

## 2022-12-18 DIAGNOSIS — N281 Cyst of kidney, acquired: Secondary | ICD-10-CM | POA: Diagnosis not present

## 2022-12-18 DIAGNOSIS — N1832 Chronic kidney disease, stage 3b: Secondary | ICD-10-CM | POA: Diagnosis not present

## 2022-12-18 DIAGNOSIS — I10 Essential (primary) hypertension: Secondary | ICD-10-CM | POA: Diagnosis not present

## 2022-12-18 DIAGNOSIS — M1000 Idiopathic gout, unspecified site: Secondary | ICD-10-CM | POA: Diagnosis not present

## 2022-12-21 ENCOUNTER — Telehealth: Payer: Self-pay | Admitting: Family Medicine

## 2022-12-21 NOTE — Telephone Encounter (Signed)
Contacted Dagmar Hait to schedule their annual wellness visit. Appointment made for 01/08/2023.  Verlee Rossetti; Care Guide Ambulatory Clinical Support [place] [facility] Direct Dial: [phone]

## 2022-12-26 DIAGNOSIS — R809 Proteinuria, unspecified: Secondary | ICD-10-CM | POA: Diagnosis not present

## 2022-12-26 DIAGNOSIS — N1832 Chronic kidney disease, stage 3b: Secondary | ICD-10-CM | POA: Diagnosis not present

## 2022-12-26 DIAGNOSIS — I10 Essential (primary) hypertension: Secondary | ICD-10-CM | POA: Diagnosis not present

## 2022-12-26 DIAGNOSIS — N281 Cyst of kidney, acquired: Secondary | ICD-10-CM | POA: Diagnosis not present

## 2022-12-26 DIAGNOSIS — M1000 Idiopathic gout, unspecified site: Secondary | ICD-10-CM | POA: Diagnosis not present

## 2022-12-27 ENCOUNTER — Other Ambulatory Visit: Payer: Self-pay | Admitting: Family Medicine

## 2022-12-27 DIAGNOSIS — I10 Essential (primary) hypertension: Secondary | ICD-10-CM

## 2022-12-27 DIAGNOSIS — M1000 Idiopathic gout, unspecified site: Secondary | ICD-10-CM

## 2022-12-27 DIAGNOSIS — E785 Hyperlipidemia, unspecified: Secondary | ICD-10-CM

## 2022-12-28 NOTE — Telephone Encounter (Signed)
Requested Prescriptions  Pending Prescriptions Disp Refills   carvedilol (COREG) 12.5 MG tablet [Pharmacy Med Name: Carvedilol 12.5 MG Oral Tablet] 180 tablet 3    Sig: TAKE 1 TABLET BY MOUTH TWICE  DAILY WITH MEALS     Cardiovascular: Beta Blockers 3 Failed - 12/27/2022 11:01 PM      Failed - Cr in normal range and within 360 days    Creatinine, Ser  Date Value Ref Range Status  09/28/2022 1.44 (H) 0.76 - 1.27 mg/dL Final         Passed - AST in normal range and within 360 days    AST  Date Value Ref Range Status  07/13/2022 20 0 - 40 IU/L Final   AST (SGOT) Piccolo, Waived  Date Value Ref Range Status  05/14/2019 33 11 - 38 U/L Final         Passed - ALT in normal range and within 360 days    ALT  Date Value Ref Range Status  07/13/2022 15 0 - 44 IU/L Final   ALT (SGPT) Piccolo, Waived  Date Value Ref Range Status  05/14/2019 22 10 - 47 U/L Final         Passed - Last BP in normal range    BP Readings from Last 1 Encounters:  11/16/22 114/68         Passed - Last Heart Rate in normal range    Pulse Readings from Last 1 Encounters:  11/16/22 77         Passed - Valid encounter within last 6 months    Recent Outpatient Visits           3 months ago Hypothyroidism, unspecified type   [facility] [place], Megan P, DO   4 months ago Essential hypertension   [place] [facility] [place], Megan P, DO   5 months ago Routine general medical examination at a health care facility   [facility] [place] P, DO   8 months ago Abscess of groin, right   [place] [facility] Mecum, Erin E, PA-C   8 months ago Abscess of groin, right   [place] [facility] Mecum, Oswaldo Conroy, PA-C       Future Appointments             In 1 month Laurier Nancy, MD [facility]   In 3 months Laural Benes, Oralia Rud, DO [place] [facility], [facility]              amLODipine (NORVASC) 10 MG tablet [Pharmacy Med Name: amLODIPine Besylate 10 MG Oral Tablet] 90 tablet 3    Sig: TAKE 1 TABLET BY MOUTH IN THE  EVENING     Cardiovascular: Calcium Channel Blockers 2 Passed - 12/27/2022 11:01 PM      Passed - Last BP in normal range    BP Readings from Last 1 Encounters:  11/16/22 114/68         Passed - Last Heart Rate in normal range    Pulse Readings from Last 1 Encounters:  11/16/22 77         Passed - Valid encounter within last 6 months    Recent Outpatient Visits           3 months ago Hypothyroidism, unspecified type   [place] [facility] [place], Megan P, DO   4 months ago Essential hypertension   [facility] [place], Megan P, DO   5 months ago Routine general medical examination at a health care facility   [facility] [place], [place] P, DO   8 months ago Abscess of groin, right   [place] [facility] Mecum, Oswaldo Conroy, PA-C   8 months ago Abscess of groin, right   [place] [facility] Mecum, Oswaldo Conroy, PA-C       Future Appointments             In 1 month Laurier Nancy, MD [facility]   In 3 months Laural Benes, Oralia Rud, DO [place] [facility], [facility]             clopidogrel (PLAVIX) 75 MG tablet [Pharmacy Med Name: Clopidogrel Bisulfate 75 MG Oral Tablet] 90 tablet 3    Sig: TAKE 1 TABLET BY MOUTH DAILY     Hematology: Antiplatelets - clopidogrel Failed - 12/27/2022 11:01 PM      Failed - Cr in normal range and within 360 days    Creatinine, Ser  Date Value Ref Range Status  09/28/2022 1.44 (H) 0.76 - 1.27 mg/dL Final         Passed - HCT in normal range and within 180 days    Hematocrit  Date Value Ref Range Status  07/13/2022 46.1 37.5 - 51.0 % Final         Passed - HGB in normal range and within 180 days    Hemoglobin  Date Value Ref Range Status  07/13/2022 14.9 13.0 - 17.7 g/dL Final          Passed - PLT in normal range and within 180 days    Platelets  Date Value Ref Range Status  07/13/2022 225 150 - 450 x10E3/uL Final         Passed - Valid encounter within last 6 months    Recent Outpatient Visits           3 months ago Hypothyroidism, unspecified type   [place] [facility] [place], Megan P, DO   4 months ago Essential hypertension   [facility] [place], Megan P, DO   5 months ago Routine general medical examination at a health care facility   [facility], Megan P, DO   8 months ago Abscess of groin, right   [place] [facility] Mecum, Erin E, PA-C   8 months ago Abscess of groin, right   [place] [facility] Mecum, Oswaldo Conroy, PA-C       Future Appointments             In 1 month Laurier Nancy, MD [facility]   In 3 months Laural Benes, Oralia Rud, DO [place] [facility], [facility]             rosuvastatin (CRESTOR) 40 MG tablet [Pharmacy Med Name: Rosuvastatin Calcium 40 MG Oral Tablet] 90 tablet 3    Sig: TAKE 1 TABLET BY MOUTH DAILY     Cardiovascular:  Antilipid - Statins 2 Failed - 12/27/2022 11:01 PM      Failed - Cr in normal range and within 360 days    Creatinine, Ser  Date Value Ref Range Status  09/28/2022 1.44 (H) 0.76 - 1.27 mg/dL Final         Failed - Lipid Panel in normal range within the last 12 months  Cholesterol, Total  Date Value Ref Range Status  07/13/2022 127 100 - 199 mg/dL Final   Cholesterol [place], [place]  Date Value Ref Range Status  05/14/2019 98 <200 mg/dL Final    Comment:                            Desirable                <200                         Borderline High      200- 239                         High                     >239    LDL Chol Calc (NIH)  Date Value Ref Range Status  07/13/2022 65 0 - 99 mg/dL Final   HDL  Date Value Ref Range Status  07/13/2022 43 >39  mg/dL Final   Triglycerides  Date Value Ref Range Status  07/13/2022 103 0 - 149 mg/dL Final   Triglycerides Piccolo,Waived  Date Value Ref Range Status  05/14/2019 205 (H) <150 mg/dL Final    Comment:                            Normal                   <150                         Borderline High     150 - 199                         High                200 - 499                         Very High                >499          Passed - Patient is not pregnant      Passed - Valid encounter within last 12 months    Recent Outpatient Visits           3 months ago Hypothyroidism, unspecified type   [place] [facility] [place], Megan P, DO   4 months ago Essential hypertension   [place] [facility] [place], Megan P, DO   5 months ago Routine general medical examination at a health care facility   [facility] [place] P, DO   8 months ago Abscess of groin, right   [place] [facility] Mecum, Erin E, PA-C   8 months ago Abscess of groin, right   [place] [facility] Mecum, Oswaldo Conroy, PA-C       Future Appointments             In 1 month Laurier Nancy, MD [facility]   In 3 months Laural Benes, Oralia Rud, DO [place] [facility], [facility]  allopurinol (ZYLOPRIM) 300 MG tablet [Pharmacy Med Name: Allopurinol 300 MG Oral Tablet] 90 tablet 3    Sig: TAKE 1 TABLET BY MOUTH DAILY     Endocrinology:  Gout Agents - allopurinol Failed - 12/27/2022 11:01 PM      Failed - Cr in normal range and within 360 days    Creatinine, Ser  Date Value Ref Range Status  09/28/2022 1.44 (H) 0.76 - 1.27 mg/dL Final         Passed - Uric Acid in normal range and within 360 days    Uric Acid  Date Value Ref Range Status  07/13/2022 5.5 3.8 - 8.4 mg/dL Final    Comment:               Therapeutic target for gout patients: <6.0         Passed - Valid encounter  within last 12 months    Recent Outpatient Visits           3 months ago Hypothyroidism, unspecified type   [place] [facility] [place], Megan P, DO   4 months ago Essential hypertension   [facility] [place], Megan P, DO   5 months ago Routine general medical examination at a health care facility   [facility], Megan P, DO   8 months ago Abscess of groin, right   [place] [facility] Mecum, Erin E, PA-C   8 months ago Abscess of groin, right   [place] [facility] Mecum, Oswaldo Conroy, PA-C       Future Appointments             In 1 month Laurier Nancy, MD [facility]   In 3 months Laural Benes, Oralia Rud, DO [place] [facility], [facility]            Passed - CBC within normal limits and completed in the last 12 months    WBC  Date Value Ref Range Status  07/13/2022 6.5 3.4 - 10.8 x10E3/uL Final  06/26/2020 11.7 (H) 4.0 - 10.5 K/uL Final   RBC  Date Value Ref Range Status  07/13/2022 5.25 4.14 - 5.80 x10E6/uL Final  06/26/2020 4.59 4.22 - 5.81 MIL/uL Final   Hemoglobin  Date Value Ref Range Status  07/13/2022 14.9 13.0 - 17.7 g/dL Final   Hematocrit  Date Value Ref Range Status  07/13/2022 46.1 37.5 - 51.0 % Final   MCHC  Date Value Ref Range Status  07/13/2022 32.3 31.5 - 35.7 g/dL Final  09/13/7251 66.4 30.0 - 36.0 g/dL Final   [facility]  Date Value Ref Range Status  07/13/2022 28.4 26.6 - 33.0 pg Final  06/26/2020 29.0 26.0 - 34.0 pg Final   MCV  Date Value Ref Range Status  07/13/2022 88 79 - 97 fL Final   No results found for: "PLTCOUNTKUC", "LABPLAT", "POCPLA" RDW  Date Value Ref Range Status  07/13/2022 13.7 11.6 - 15.4 % Final

## 2023-01-08 ENCOUNTER — Ambulatory Visit (INDEPENDENT_AMBULATORY_CARE_PROVIDER_SITE_OTHER): Payer: Medicare Other

## 2023-01-08 VITALS — Ht 67.0 in | Wt 237.0 lb

## 2023-01-08 DIAGNOSIS — Z0000 Encounter for general adult medical examination without abnormal findings: Secondary | ICD-10-CM | POA: Diagnosis not present

## 2023-01-08 NOTE — Patient Instructions (Signed)
Ricky Melton , Thank you for taking time to come for your Medicare Wellness Visit. I appreciate your ongoing commitment to your health goals. Please review the following plan we discussed and let me know if I can assist you in the future.   These are the goals we discussed:  Goals      DIET - EAT MORE FRUITS AND VEGETABLES     DIET - INCREASE WATER INTAKE     Recommend drinking at least 6-8 glasses of water a day      Weight (lb) < 200 lb (90.7 kg)     Get off some medications        This is a list of the screening recommended for you and due dates:  Health Maintenance  Topic Date Due   Zoster (Shingles) Vaccine (2 of 2) 09/08/2021   COVID-19 Vaccine (4 - 2023-24 season) 05/12/2022   Flu Shot  04/12/2023   Medicare Annual Wellness Visit  01/08/2024   DTaP/Tdap/Td vaccine (3 - Td or Tdap) 04/13/2026   Colon Cancer Screening  05/10/2028   Pneumonia Vaccine  Completed   Hepatitis C Screening: USPSTF Recommendation to screen - Ages 18-79 yo.  Completed   HPV Vaccine  Aged Out    Advanced directives: no  Conditions/risks identified: none  Next appointment: Follow up in one year for your annual wellness visit. 01/14/24 @ 11:15 am by phone  Preventive Care 65 Years and Older, Male  Preventive care refers to lifestyle choices and visits with your health care provider that can promote health and wellness. What does preventive care include? A yearly physical exam. This is also called an annual well check. Dental exams once or twice a year. Routine eye exams. Ask your health care provider how often you should have your eyes checked. Personal lifestyle choices, including: Daily care of your teeth and gums. Regular physical activity. Eating a healthy diet. Avoiding tobacco and drug use. Limiting alcohol use. Practicing safe sex. Taking low doses of aspirin every day. Taking vitamin and mineral supplements as recommended by your health care provider. What happens during an annual  well check? The services and screenings done by your health care provider during your annual well check will depend on your age, overall health, lifestyle risk factors, and family history of disease. Counseling  Your health care provider may ask you questions about your: Alcohol use. Tobacco use. Drug use. Emotional well-being. Home and relationship well-being. Sexual activity. Eating habits. History of falls. Memory and ability to understand (cognition). Work and work Astronomer. Screening  You may have the following tests or measurements: Height, weight, and BMI. Blood pressure. Lipid and cholesterol levels. These may be checked every 5 years, or more frequently if you are over 25 years old. Skin check. Lung cancer screening. You may have this screening every year starting at age 33 if you have a 30-pack-year history of smoking and currently smoke or have quit within the past 15 years. Fecal occult blood test (FOBT) of the stool. You may have this test every year starting at age 59. Flexible sigmoidoscopy or colonoscopy. You may have a sigmoidoscopy every 5 years or a colonoscopy every 10 years starting at age 4. Prostate cancer screening. Recommendations will vary depending on your family history and other risks. Hepatitis C blood test. Hepatitis B blood test. Sexually transmitted disease (STD) testing. Diabetes screening. This is done by checking your blood sugar (glucose) after you have not eaten for a while (fasting). You may have this  done every 1-3 years. Abdominal aortic aneurysm (AAA) screening. You may need this if you are a current or former smoker. Osteoporosis. You may be screened starting at age 56 if you are at high risk. Talk with your health care provider about your test results, treatment options, and if necessary, the need for more tests. Vaccines  Your health care provider may recommend certain vaccines, such as: Influenza vaccine. This is recommended every  year. Tetanus, diphtheria, and acellular pertussis (Tdap, Td) vaccine. You may need a Td booster every 10 years. Zoster vaccine. You may need this after age 45. Pneumococcal 13-valent conjugate (PCV13) vaccine. One dose is recommended after age 41. Pneumococcal polysaccharide (PPSV23) vaccine. One dose is recommended after age 33. Talk to your health care provider about which screenings and vaccines you need and how often you need them. This information is not intended to replace advice given to you by your health care provider. Make sure you discuss any questions you have with your health care provider. Document Released: 09/24/2015 Document Revised: 05/17/2016 Document Reviewed: 06/29/2015 Elsevier Interactive Patient Education  2017 [place] Prevention in the Home Falls can cause injuries. They can happen to people of all ages. There are many things you can do to make your home safe and to help prevent falls. What can I do on the outside of my home? Regularly fix the edges of walkways and driveways and fix any cracks. Remove anything that might make you trip as you walk through a door, such as a raised step or threshold. Trim any bushes or trees on the path to your home. Use bright outdoor lighting. Clear any walking paths of anything that might make someone trip, such as rocks or tools. Regularly check to see if handrails are loose or broken. Make sure that both sides of any steps have handrails. Any raised decks and porches should have guardrails on the edges. Have any leaves, snow, or ice cleared regularly. Use sand or salt on walking paths during winter. Clean up any spills in your garage right away. This includes oil or grease spills. What can I do in the bathroom? Use night lights. Install grab bars by the toilet and in the tub and shower. Do not use towel bars as grab bars. Use non-skid mats or decals in the tub or shower. If you need to sit down in the shower, use a  plastic, non-slip stool. Keep the floor dry. Clean up any water that spills on the floor as soon as it happens. Remove soap buildup in the tub or shower regularly. Attach bath mats securely with double-sided non-slip rug tape. Do not have throw rugs and other things on the floor that can make you trip. What can I do in the bedroom? Use night lights. Make sure that you have a light by your bed that is easy to reach. Do not use any sheets or blankets that are too big for your bed. They should not hang down onto the floor. Have a firm chair that has side arms. You can use this for support while you get dressed. Do not have throw rugs and other things on the floor that can make you trip. What can I do in the kitchen? Clean up any spills right away. Avoid walking on wet floors. Keep items that you use a lot in easy-to-reach places. If you need to reach something above you, use a strong step stool that has a grab bar. Keep electrical cords out of  the way. Do not use floor polish or wax that makes floors slippery. If you must use wax, use non-skid floor wax. Do not have throw rugs and other things on the floor that can make you trip. What can I do with my stairs? Do not leave any items on the stairs. Make sure that there are handrails on both sides of the stairs and use them. Fix handrails that are broken or loose. Make sure that handrails are as long as the stairways. Check any carpeting to make sure that it is firmly attached to the stairs. Fix any carpet that is loose or worn. Avoid having throw rugs at the top or bottom of the stairs. If you do have throw rugs, attach them to the floor with carpet tape. Make sure that you have a light switch at the top of the stairs and the bottom of the stairs. If you do not have them, ask someone to add them for you. What else can I do to help prevent falls? Wear shoes that: Do not have high heels. Have rubber bottoms. Are comfortable and fit you  well. Are closed at the toe. Do not wear sandals. If you use a stepladder: Make sure that it is fully opened. Do not climb a closed stepladder. Make sure that both sides of the stepladder are locked into place. Ask someone to hold it for you, if possible. Clearly mark and make sure that you can see: Any grab bars or handrails. First and last steps. Where the edge of each step is. Use tools that help you move around (mobility aids) if they are needed. These include: Canes. Walkers. Scooters. Crutches. Turn on the lights when you go into a dark area. Replace any light bulbs as soon as they burn out. Set up your furniture so you have a clear path. Avoid moving your furniture around. If any of your floors are uneven, fix them. If there are any pets around you, be aware of where they are. Review your medicines with your doctor. Some medicines can make you feel dizzy. This can increase your chance of falling. Ask your doctor what other things that you can do to help prevent falls. This information is not intended to replace advice given to you by your health care provider. Make sure you discuss any questions you have with your health care provider. Document Released: 06/24/2009 Document Revised: 02/03/2016 Document Reviewed: 10/02/2014 Elsevier Interactive Patient Education  2017 [employer].

## 2023-01-08 NOTE — Progress Notes (Signed)
I connected with  Dagmar Hait on 01/08/23 by a audio enabled telemedicine application and verified that I am speaking with the correct person using two identifiers.  Patient Location: Home  Provider Location: Office/Clinic  I discussed the limitations of evaluation and management by telemedicine. The patient expressed understanding and agreed to proceed.  Subjective:   Ricky Melton is a 72 y.o. male who presents for Medicare Annual/Subsequent preventive examination.  Review of Systems     Cardiac Risk Factors include: advanced age (>57men, >1 women);hypertension;male gender;obesity (BMI >30kg/m2)     Objective:    There were no vitals filed for this visit. There is no height or weight on file to calculate BMI.     01/08/2023    1:38 PM 01/05/2022   12:03 PM 01/03/2021    1:56 PM 06/26/2020    1:22 PM 11/19/2019   10:33 AM 11/07/2018   11:10 AM 05/10/2018    9:49 AM  Advanced Directives  Does Patient Have a Medical Advance Directive? No No No No No No No  Would patient like information on creating a medical advance directive? No - Patient declined No - Patient declined    Yes (MAU/Ambulatory/Procedural Areas - Information given)     Current Medications (verified) Outpatient Encounter Medications as of 01/08/2023  Medication Sig   allopurinol (ZYLOPRIM) 300 MG tablet TAKE 1 TABLET BY MOUTH DAILY   amLODipine (NORVASC) 10 MG tablet TAKE 1 TABLET BY MOUTH IN THE  EVENING   aspirin 81 MG tablet Take 81 mg by mouth daily.   benazepril (LOTENSIN) 40 MG tablet Take 1 tablet (40 mg total) by mouth daily.   carvedilol (COREG) 12.5 MG tablet TAKE 1 TABLET BY MOUTH TWICE  DAILY WITH MEALS   carvedilol (COREG) 25 MG tablet Take 1 tablet (25 mg total) by mouth 2 (two) times daily with a meal.   clopidogrel (PLAVIX) 75 MG tablet TAKE 1 TABLET BY MOUTH DAILY   diclofenac Sodium (VOLTAREN) 1 % GEL Apply 4 g topically 4 (four) times daily.   fexofenadine (ALLEGRA) 180 MG tablet Take by  mouth.   fluticasone (FLONASE) 50 MCG/ACT nasal spray Place 2 sprays into both nostrils daily.   levothyroxine (SYNTHROID) 50 MCG tablet TAKE 1 TABLET BY MOUTH DAILY   rosuvastatin (CRESTOR) 40 MG tablet TAKE 1 TABLET BY MOUTH DAILY   benzonatate (TESSALON) 200 MG capsule Take 1 capsule (200 mg total) by mouth 2 (two) times daily as needed for cough. (Patient not taking: Reported on 01/08/2023)   meclizine (ANTIVERT) 25 MG tablet Take 1 tablet (25 mg total) by mouth 3 (three) times daily as needed for dizziness. (Patient not taking: Reported on 01/08/2023)   No facility-administered encounter medications on file as of 01/08/2023.    Allergies (verified) Hydrochlorothiazide   History: Past Medical History:  Diagnosis Date   Benign hematuria    CAD (coronary artery disease)    Chronic kidney disease    Hyperlipidemia    Hypertension    Past Surgical History:  Procedure Laterality Date   cardiac stents     x2   COLONOSCOPY WITH PROPOFOL N/A 05/10/2018   Procedure: COLONOSCOPY WITH PROPOFOL;  Surgeon: Wyline Mood, MD;  Location: [facility] ENDOSCOPY;  Service: Gastroenterology;  Laterality: N/A;   EYE SURGERY Right 2021   cataract sx in [place]    Family History  Problem Relation Age of Onset   Hypertension Mother    Diabetes Sister    Diabetes Son    Social  History   Socioeconomic History   Marital status: Married    Spouse name: Not on file   Number of children: Not on file   Years of education: Not on file   Highest education level: High school graduate  Occupational History   Occupation: maintenence     Comment: part time   Tobacco Use   Smoking status: Never   Smokeless tobacco: Former    Types: Chew    Quit date: 2012  Vaping Use   Vaping Use: Never used  Substance and Sexual Activity   Alcohol use: No    Alcohol/week: 0.0 standard drinks of alcohol   Drug use: No   Sexual activity: Yes  Other Topics Concern   Not on file  Social History Narrative   Plays  golf with friends, mows yards    Social Determinants of Health   Financial Resource Strain: Low Risk  (01/08/2023)   Overall Financial Resource Strain (CARDIA)    Difficulty of Paying Living Expenses: Not hard at all  Food Insecurity: No Food Insecurity (01/08/2023)   Hunger Vital Sign    Worried About Running Out of Food in the Last Year: Never true    Ran Out of Food in the Last Year: Never true  Transportation Needs: No Transportation Needs (01/08/2023)   PRAPARE - Administrator, Civil Service (Medical): No    Lack of Transportation (Non-Medical): No  Physical Activity: Sufficiently Active (01/08/2023)   Exercise Vital Sign    Days of Exercise per Week: 3 days    Minutes of Exercise per Session: 60 min  Stress: No Stress Concern Present (01/08/2023)   [employer] of Occupational Health - Occupational Stress Questionnaire    Feeling of Stress : Not at all  Social Connections: Socially Integrated (01/08/2023)   Social Connection and Isolation Panel [NHANES]    Frequency of Communication with Friends and Family: More than three times a week    Frequency of Social Gatherings with Friends and Family: More than three times a week    Attends Religious Services: More than 4 times per year    Active Member of [employer] or Organizations: Yes    Attends Engineer, structural: More than 4 times per year    Marital Status: Married    Tobacco Counseling Counseling given: Not Answered   Clinical Intake:  Pre-visit preparation completed: Yes  Pain : No/denies pain     Nutritional Risks: None Diabetes: No  How often do you need to have someone help you when you read instructions, pamphlets, or other written materials from your doctor or pharmacy?: 1 - Never  Diabetic?o  Interpreter Needed?: No  Information entered by :: Kennedy Bucker, LPN   Activities of Daily Living    01/08/2023    1:39 PM 07/13/2022    8:16 AM  In your present state of health, do you  have any difficulty performing the following activities:  Hearing? 0 0  Vision? 0 0  Difficulty concentrating or making decisions? 0 0  Walking or climbing stairs? 0 0  Dressing or bathing? 0 0  Doing errands, shopping? 0 0  Preparing Food and eating ? N   Using the Toilet? N   In the past six months, have you accidently leaked urine? N   Do you have problems with loss of bowel control? N   Managing your Medications? N   Managing your Finances? N   Housekeeping or managing your Housekeeping? N  Patient Care Team: Dorcas Carrow, DO as PCP - General (Family Medicine) Laurier Nancy, MD as Consulting Physician (Cardiology)  Indicate any recent Medical Services you may have received from other than [facility] providers in the past year (date may be approximate).     Assessment:   This is a routine wellness examination for [facility].  Hearing/Vision screen Hearing Screening - Comments:: No aids Vision Screening - Comments:: Wears glasses- MD in   Dietary issues and exercise activities discussed: Current Exercise Habits: Home exercise routine, Type of exercise: walking, Time (Minutes): 60, Frequency (Times/Week): 3, Weekly Exercise (Minutes/Week): 180, Intensity: Mild   Goals Addressed             This Visit's Progress    DIET - EAT MORE FRUITS AND VEGETABLES         Depression Screen    01/08/2023    1:36 PM 09/28/2022    8:28 AM 08/24/2022    8:22 AM 07/13/2022    8:13 AM 04/27/2022   11:51 AM 04/19/2022   11:43 AM 01/05/2022   12:08 PM  PHQ 2/9 Scores  PHQ - 2 Score 0 0 0 0 0 0 0  PHQ- 9 Score 0 0 0 0 0 0 0    Fall Risk    01/08/2023    1:39 PM 09/28/2022    8:28 AM 08/24/2022    8:22 AM 07/13/2022    8:13 AM 04/27/2022   11:51 AM  Fall Risk   Falls in the past year? 0 0 0 0 0  Number falls in past yr: 0 0 0 0 0  Injury with Fall? 0 0 0 0 0  Risk for fall due to : No Fall Risks No Fall Risks No Fall Risks No Fall Risks No Fall Risks  Follow up Falls  prevention discussed;Falls evaluation completed Falls evaluation completed Falls evaluation completed Falls evaluation completed Falls evaluation completed    FALL RISK PREVENTION PERTAINING TO THE HOME:  Any stairs in or around the home? Yes  If so, are there any without handrails? No  Home free of loose throw rugs in walkways, pet beds, electrical cords, etc? Yes  Adequate lighting in your home to reduce risk of falls? Yes   ASSISTIVE DEVICES UTILIZED TO PREVENT FALLS:  Life alert? No  Use of a cane, walker or w/c? No  Grab bars in the bathroom? No  Shower chair or bench in shower? No  Elevated toilet seat or a handicapped toilet? No    Cognitive Function:        01/08/2023    1:42 PM 01/05/2022   12:02 PM 01/03/2021    2:00 PM 11/19/2019   10:39 AM 11/07/2018   11:14 AM  6CIT Screen  What Year? 0 points 0 points 0 points 0 points 0 points  What month? 0 points 0 points 0 points 0 points 0 points  What time? 0 points 0 points 0 points 0 points 0 points  Count back from 20 0 points 0 points 0 points 0 points 0 points  Months in reverse 0 points 0 points 0 points 0 points 0 points  Repeat phrase 0 points 4 points 8 points 0 points 2 points  Total Score 0 points 4 points 8 points 0 points 2 points    Immunizations Immunization History  Administered Date(s) Administered   Fluad Quad(high Dose 65+) 05/21/2019, 06/07/2020, 07/07/2021, 07/13/2022   Influenza, High Dose Seasonal PF 06/27/2018   Influenza,inj,Quad PF,6+ Mos 10/14/2015  Moderna Sars-Covid-2 Vaccination 11/08/2019, 12/06/2019, 12/08/2020   Pneumococcal Conjugate-13 04/13/2016   Pneumococcal Polysaccharide-23 06/07/2006, 10/25/2017   Td 01/23/2006   Tdap 04/13/2016   Zoster Recombinat (Shingrix) 07/14/2021   Zoster, Live 08/21/2011    TDAP status: Up to date  Flu Vaccine status: Up to date  Pneumococcal vaccine status: Up to date  Covid-19 vaccine status: Completed vaccines  Qualifies for Shingles  Vaccine? Yes   Zostavax completed Yes   Shingrix Completed?: No.    Education has been provided regarding the importance of this vaccine. Patient has been advised to call insurance company to determine out of pocket expense if they have not yet received this vaccine. Advised may also receive vaccine at local pharmacy or Health Dept. Verbalized acceptance and understanding.  Screening Tests Health Maintenance  Topic Date Due   Zoster Vaccines- Shingrix (2 of 2) 09/08/2021   COVID-19 Vaccine (4 - 2023-24 season) 05/12/2022   INFLUENZA VACCINE  04/12/2023   Medicare Annual Wellness (AWV)  01/08/2024   DTaP/Tdap/Td (3 - Td or Tdap) 04/13/2026   COLONOSCOPY (Pts 45-24yrs Insurance coverage will need to be confirmed)  05/10/2028   Pneumonia Vaccine 8+ Years old  Completed   Hepatitis C Screening  Completed   HPV VACCINES  Aged Out    Health Maintenance  Health Maintenance Due  Topic Date Due   Zoster Vaccines- Shingrix (2 of 2) 09/08/2021   COVID-19 Vaccine (4 - 2023-24 season) 05/12/2022    Colorectal cancer screening: Type of screening: Colonoscopy. Completed 05/10/18. Repeat every 10 years  Lung Cancer Screening: (Low Dose CT Chest recommended if Age 96-80 years, 30 pack-year currently smoking OR have quit w/in 15years.) does not qualify.   Additional Screening:  Hepatitis C Screening: does qualify; Completed 10/14/15  Vision Screening: Recommended annual ophthalmology exams for early detection of glaucoma and other disorders of the eye. Is the patient up to date with their annual eye exam?  Yes  Who is the provider or what is the name of the office in which the patient attends annual eye exams? MD in [place] If pt is not established with a provider, would they like to be referred to a provider to establish care? No .   Dental Screening: Recommended annual dental exams for proper oral hygiene  Community Resource Referral / Chronic Care Management: CRR required this visit?  No    CCM required this visit?  No      Plan:     I have personally reviewed and noted the following in the patient's chart:   Medical and social history Use of alcohol, tobacco or illicit drugs  Current medications and supplements including opioid prescriptions. Patient is not currently taking opioid prescriptions. Functional ability and status Nutritional status Physical activity Advanced directives List of other physicians Hospitalizations, surgeries, and ER visits in previous 12 months Vitals Screenings to include cognitive, depression, and falls Referrals and appointments  In addition, I have reviewed and discussed with patient certain preventive protocols, quality metrics, and best practice recommendations. A written personalized care plan for preventive services as well as general preventive health recommendations were provided to patient.     Hal Hope, LPN   [id number]/61/0960   Nurse Notes: none

## 2023-01-28 ENCOUNTER — Other Ambulatory Visit: Payer: Self-pay | Admitting: Family Medicine

## 2023-01-28 DIAGNOSIS — E039 Hypothyroidism, unspecified: Secondary | ICD-10-CM

## 2023-01-30 NOTE — Telephone Encounter (Signed)
Requested Prescriptions  Pending Prescriptions Disp Refills   levothyroxine (SYNTHROID) 50 MCG tablet [Pharmacy Med Name: Levothyroxine Sodium 50 MCG Oral Tablet] 100 tablet 1    Sig: TAKE 1 TABLET BY MOUTH DAILY     Endocrinology:  Hypothyroid Agents Passed - 01/28/2023 10:27 PM      Passed - TSH in normal range and within 360 days    TSH  Date Value Ref Range Status  09/28/2022 2.790 0.450 - 4.500 uIU/mL Final         Passed - Valid encounter within last 12 months    Recent Outpatient Visits           4 months ago Hypothyroidism, unspecified type   [facility] [place], Megan P, DO   5 months ago Essential hypertension   [place] [facility] [place], Megan P, DO   6 months ago Routine general medical examination at a health care facility   [facility], Megan P, DO   9 months ago Abscess of groin, right   [place] [facility], Oswaldo Conroy, PA-C   9 months ago Abscess of groin, right   [place] [facility] Mecum, Oswaldo Conroy, PA-C       Future Appointments             In 2 weeks Laurier Nancy, MD [facility]   In 2 months Laural Benes, Oralia Rud, DO [facility] [place]

## 2023-02-02 ENCOUNTER — Ambulatory Visit
Admission: RE | Admit: 2023-02-02 | Discharge: 2023-02-02 | Disposition: A | Discharge status: Home / Self Care | Payer: Medicare Other | Source: Ambulatory Visit | Attending: Family Medicine | Admitting: Family Medicine

## 2023-02-02 ENCOUNTER — Other Ambulatory Visit: Payer: Self-pay | Admitting: Family Medicine

## 2023-02-02 ENCOUNTER — Encounter: Payer: Self-pay | Admitting: Family Medicine

## 2023-02-02 ENCOUNTER — Ambulatory Visit (INDEPENDENT_AMBULATORY_CARE_PROVIDER_SITE_OTHER): Payer: Medicare Other | Admitting: Family Medicine

## 2023-02-02 ENCOUNTER — Ambulatory Visit
Admission: RE | Admit: 2023-02-02 | Discharge: 2023-02-02 | Disposition: A | Discharge status: Home / Self Care | Payer: Medicare Other | Attending: Family Medicine | Admitting: Family Medicine

## 2023-02-02 ENCOUNTER — Telehealth: Payer: Self-pay | Admitting: Family Medicine

## 2023-02-02 VITALS — BP 134/69 | HR 59 | Temp 98.2°F | Ht 67.0 in | Wt 232.8 lb

## 2023-02-02 DIAGNOSIS — M25461 Effusion, right knee: Secondary | ICD-10-CM | POA: Diagnosis not present

## 2023-02-02 DIAGNOSIS — M1000 Idiopathic gout, unspecified site: Secondary | ICD-10-CM | POA: Diagnosis not present

## 2023-02-02 DIAGNOSIS — M25561 Pain in right knee: Secondary | ICD-10-CM | POA: Insufficient documentation

## 2023-02-02 MED ORDER — PREDNISONE 10 MG PO TABS: ORAL_TABLET | ORAL | 0 refills | Status: DC

## 2023-02-02 MED ORDER — TRIAMCINOLONE ACETONIDE 40 MG/ML IJ SUSP
40.0000 mg | Freq: Once | INTRAMUSCULAR | Status: AC
Start: 2023-02-02 — End: 2023-02-02
  Administered 2023-02-02: 40 mg via INTRAMUSCULAR

## 2023-02-02 MED ORDER — ALLOPURINOL 300 MG PO TABS
150.0000 mg | ORAL_TABLET | Freq: Every day | ORAL | 0 refills | Status: DC
Start: 2023-02-02 — End: 2023-02-19

## 2023-02-02 MED ORDER — PREDNISONE 10 MG PO TABS
ORAL_TABLET | ORAL | 0 refills | Status: DC
Start: 2023-02-02 — End: 2023-02-02

## 2023-02-02 MED ORDER — FEXOFENADINE HCL 180 MG PO TABS: 180.0000 mg | ORAL_TABLET | Freq: Every day | ORAL | 1 refills | Status: DC

## 2023-02-02 NOTE — Telephone Encounter (Signed)
Rx sent to local pharmacy- can you check with him about what else he's confused about!

## 2023-02-02 NOTE — Progress Notes (Signed)
BP 134/69   Pulse (!) 59   Temp 98.2 F (36.8 C) (Oral)   Ht 5\' 7"  (1.702 m)   Wt 232 lb 12.8 oz (105.6 kg)   SpO2 98%   BMI 36.46 kg/m    Subjective:    Patient ID: Ricky Melton, male    DOB: [birth year], 72 y.o.   MRN: [id number]  HPI: Ricky Melton is a 72 y.o. male  Chief Complaint  Patient presents with   Knee Pain    Patient says for the past two weeks, he has noticed swelling and pain in his R knee. Patient says radiating up to his back of his R hip and buttock. Patient says he is noticed pain in his L knee as he is trying to keep the weight off his R knee.    KNEE PAIN Duration: 2 weeks Involved knee: right Mechanism of injury: unknown Location:diffuse Onset: gradual Severity: severe  Quality:  tooth ache, nagging Frequency: constant Radiation: up towards his hip Aggravating factors: moving, bending, cold  Alleviating factors: tylenol arthritis  Status: better Treatments attempted: rest, ice, heat, and APAP  Relief with NSAIDs?:  No NSAIDs Taken Weakness with weight bearing or walking: yes Sensation of giving way: yes Locking: no Popping: no Bruising: no Swelling: yes Redness: no Paresthesias/decreased sensation: no Fevers: no   Relevant past medical, surgical, family and social history reviewed and updated as indicated. Interim medical history since our last visit reviewed. Allergies and medications reviewed and updated.  Review of Systems  Constitutional: Negative.   Respiratory: Negative.    Cardiovascular: Negative.   Gastrointestinal: Negative.   Musculoskeletal:  Positive for arthralgias. Negative for back pain, gait problem, joint swelling, myalgias, neck pain and neck stiffness.  Skin: Negative.   Neurological: Negative.   Psychiatric/Behavioral: Negative.      Per HPI unless specifically indicated above     Objective:    BP 134/69   Pulse (!) 59   Temp 98.2 F (36.8 C) (Oral)   Ht 5\' 7"  (1.702 m)   Wt 232 lb 12.8 oz (105.6  kg)   SpO2 98%   BMI 36.46 kg/m   Wt Readings from Last 3 Encounters:  02/02/23 232 lb 12.8 oz (105.6 kg)  01/08/23 237 lb (107.5 kg)  11/16/22 237 lb (107.5 kg)    Physical Exam Vitals and nursing note reviewed.  Constitutional:      General: He is not in acute distress.    Appearance: Normal appearance. He is not ill-appearing, toxic-appearing or diaphoretic.  HENT:     Head: Normocephalic and atraumatic.     Right Ear: External ear normal.     Left Ear: External ear normal.     Nose: Nose normal.     Mouth/Throat:     Mouth: Mucous membranes are moist.     Pharynx: Oropharynx is clear.  Eyes:     General: No scleral icterus.       Right eye: No discharge.        Left eye: No discharge.     Extraocular Movements: Extraocular movements intact.     Conjunctiva/sclera: Conjunctivae normal.     Pupils: Pupils are equal, round, and reactive to light.  Cardiovascular:     Rate and Rhythm: Normal rate and regular rhythm.     Pulses: Normal pulses.     Heart sounds: Normal heart sounds. No murmur heard.    No friction rub. No gallop.  Pulmonary:     Effort:  Pulmonary effort is normal. No respiratory distress.     Breath sounds: Normal breath sounds. No stridor. No wheezing, rhonchi or rales.  Chest:     Chest wall: No tenderness.  Musculoskeletal:        General: Swelling (R knee) and tenderness (R knee) present.     Cervical back: Normal range of motion and neck supple.  Skin:    General: Skin is warm and dry.     Capillary Refill: Capillary refill takes less than 2 seconds.     Coloration: Skin is not jaundiced or pale.     Findings: No bruising, erythema, lesion or rash.  Neurological:     General: No focal deficit present.     Mental Status: He is alert and oriented to person, place, and time. Mental status is at baseline.  Psychiatric:        Mood and Affect: Mood normal.        Behavior: Behavior normal.        Thought Content: Thought content normal.         Judgment: Judgment normal.     Results for orders placed or performed in visit on 09/28/22  Basic metabolic panel  Result Value Ref Range   Glucose 103 (H) 70 - 99 mg/dL   BUN 21 8 - 27 mg/dL   Creatinine, Ser 1.61 (H) 0.76 - 1.27 mg/dL   eGFR 52 (L) >09 UE/AVW/0.98   BUN/Creatinine Ratio 15 10 - 24   Sodium 140 134 - 144 mmol/L   Potassium 4.1 3.5 - 5.2 mmol/L   Chloride 106 96 - 106 mmol/L   CO2 21 20 - 29 mmol/L   Calcium 9.4 8.6 - 10.2 mg/dL  TSH  Result Value Ref Range   TSH 2.790 0.450 - 4.500 uIU/mL      Assessment & Plan:   Problem List Items Addressed This Visit       Other   Gout   Relevant Medications   allopurinol (ZYLOPRIM) 300 MG tablet   Other Visit Diagnoses     Acute pain of right knee    -  Primary   Will check labs and get x-ray. Treat with steroid taper. If not better in 1-2 weeks will get him in for steroid shot pending results.   Relevant Medications   triamcinolone acetonide (KENALOG-40) injection 40 mg (Completed)   Other Relevant Orders   Uric acid   Comprehensive metabolic panel   Lyme Disease Serology w/Reflex   Spotted Fever Group Antibodies   DG Knee Complete 4 Views Right        Follow up plan: Return in about 2 weeks (around 02/16/2023).

## 2023-02-02 NOTE — Telephone Encounter (Signed)
Spoke with [facility] to clarify what is going on with patient's prescription for Prednisone 10 MG. [facility] says the insurance was rejecting the medication and saying it could not be filled until 02/07/23. I realized Dr Laural Benes sent the medication to [facility] first and then sent it to local pharmacy of [facility]. Spoke with [facility] technician and was informed that the prescription was in the process of being shipped, so she was going to submit an order to try and cancel the order from being shipped. Pharmacist technician says she would give our office a call back within the 15-30 minutes to follow up with the status of the order.

## 2023-02-02 NOTE — Telephone Encounter (Signed)
Spoke to [place], [facility] to advise this is being routed to the office for Dr. Laural Benes to resend medication.

## 2023-02-02 NOTE — Telephone Encounter (Unsigned)
Copied from CRM [id number]. Topic: General - Other >> Feb 02, 2023 11:03 AM Turkey B wrote: Reason for CRM: pt called in seems to be confused about med, at first he said something for hsi knee and then predisone. He is asking for call back to clarify

## 2023-02-02 NOTE — Telephone Encounter (Signed)
Medication Refill - Medication: predniSONE (DELTASONE) 10 MG tablet   Pt stated his medication was supposed to be sent to the pharmacy below as he needs to start taking it tomorrow, and it was sent to [facility].  Requesting a medication transfer. Requesting it be done as soon as possible.   Has the patient contacted their pharmacy? Yes.    (Agent: If yes, when and what did the pharmacy advise?)  Preferred Pharmacy (with phone number or street name):  [facility] [address] [place] - [address]  [address] [place] [address]  Phone: [phone] Fax: [phone]  Hours: Not open 24 hours   Has the patient been seen for an appointment in the last year OR does the patient have an upcoming appointment? Yes.    Agent: Please be advised that RX refills may take up to 3 business days. We ask that you follow-up with your pharmacy.

## 2023-02-03 LAB — URIC ACID: Uric Acid: 5.7 mg/dL (ref 3.8–8.4)

## 2023-02-03 LAB — COMPREHENSIVE METABOLIC PANEL
BUN/Creatinine Ratio: 16 (ref 10–24)
CO2: 22 mmol/L (ref 20–29)

## 2023-02-06 LAB — SPOTTED FEVER GROUP ANTIBODIES

## 2023-02-08 ENCOUNTER — Telehealth: Payer: Self-pay | Admitting: Family Medicine

## 2023-02-08 LAB — COMPREHENSIVE METABOLIC PANEL
ALT: 11 IU/L (ref 0–44)
BUN: 24 mg/dL (ref 8–27)
Chloride: 107 mmol/L — ABNORMAL HIGH (ref 96–106)
Globulin, Total: 2.5 g/dL (ref 1.5–4.5)
Sodium: 141 mmol/L (ref 134–144)
eGFR: 48 mL/min/{1.73_m2} — ABNORMAL LOW (ref >59)

## 2023-02-08 LAB — LYME DISEASE SEROLOGY W/REFLEX: Lyme Total Antibody EIA: NEGATIVE

## 2023-02-08 NOTE — Telephone Encounter (Signed)
Pt called to get his results from his labs. Please contact pt to explain his results

## 2023-02-09 ENCOUNTER — Other Ambulatory Visit: Payer: Self-pay | Admitting: Family Medicine

## 2023-02-09 ENCOUNTER — Ambulatory Visit: Payer: Self-pay | Admitting: *Deleted

## 2023-02-09 LAB — COMPREHENSIVE METABOLIC PANEL
AST: 16 IU/L (ref 0–40)
Albumin/Globulin Ratio: 1.8 (ref 1.2–2.2)
Albumin: 4.5 g/dL (ref 3.8–4.8)
Alkaline Phosphatase: 55 IU/L (ref 44–121)
Bilirubin Total: 0.4 mg/dL (ref 0.0–1.2)
Calcium: 9.7 mg/dL (ref 8.6–10.2)
Creatinine, Ser: 1.52 mg/dL — ABNORMAL HIGH (ref 0.76–1.27)
Glucose: 100 mg/dL — ABNORMAL HIGH (ref 70–99)
Potassium: 5.3 mmol/L — ABNORMAL HIGH (ref 3.5–5.2)
Total Protein: 7 g/dL (ref 6.0–8.5)

## 2023-02-09 LAB — SPOTTED FEVER GROUP ANTIBODIES: Spotted Fever Group IgM: <1:64 {titer}

## 2023-02-09 MED ORDER — DOXYCYCLINE HYCLATE 100 MG PO TABS: 100.0000 mg | ORAL_TABLET | Freq: Two times a day (BID) | ORAL | 0 refills | Status: DC

## 2023-02-09 NOTE — Telephone Encounter (Signed)
Reason for Disposition  [1] Follow-up call to recent contact AND [2] information only call, no triage required  Answer Assessment - Initial Assessment Questions 1. REASON FOR CALL or QUESTION: "What is your reason for calling today?" or "How can I best help you?" or "What question do you have that I can help answer?"     Pt given lab results per notes of Dr. Olevia Perches on 02/09/2023 at 3:42 PM.   Also x ray report of knee 02/04/2023 at 1:09 PM.     Pt verbalized understanding.  Protocols used: Information Only Call - No Triage-A-AH

## 2023-02-09 NOTE — Telephone Encounter (Signed)
  Chief Complaint: Called in and was given lab and knee x ray results. Symptoms:  Frequency:  Pertinent Negatives: Patient denies  Disposition: [] ED /[] Urgent Care (no appt availability in office) / [] Appointment(In office/virtual)/ []  [facility]/ [x] Home Care/ [] Refused Recommended Disposition /[] [facility]/ []  Follow-up with PCP Additional Notes:

## 2023-02-15 ENCOUNTER — Ambulatory Visit: Payer: Self-pay

## 2023-02-15 ENCOUNTER — Ambulatory Visit: Payer: Medicare Other | Admitting: Cardiovascular Disease

## 2023-02-15 ENCOUNTER — Encounter: Payer: Self-pay | Admitting: Cardiovascular Disease

## 2023-02-15 VITALS — BP 128/65 | HR 60 | Ht 67.0 in | Wt 228.4 lb

## 2023-02-15 DIAGNOSIS — E782 Mixed hyperlipidemia: Secondary | ICD-10-CM

## 2023-02-15 DIAGNOSIS — I25738 Atherosclerosis of nonautologous biological coronary artery bypass graft(s) with other forms of angina pectoris: Secondary | ICD-10-CM | POA: Diagnosis not present

## 2023-02-15 DIAGNOSIS — I10 Essential (primary) hypertension: Secondary | ICD-10-CM

## 2023-02-15 NOTE — Assessment & Plan Note (Addendum)
CCTA 07/2018 RCA distally in PDA and PLV calcified. Stent in mid LAD has no significant restenosis but has mild disease prior to stent. LCX has no significant disease. Will schedule stress test and echo prior to next visit.

## 2023-02-15 NOTE — Assessment & Plan Note (Signed)
The patient is asked to make an attempt to improve diet and exercise patterns to aid in medical management of this problem.  

## 2023-02-15 NOTE — Assessment & Plan Note (Signed)
B/p well controlled. Continue to monitor b/p at home. Continue same medications.  

## 2023-02-15 NOTE — Telephone Encounter (Signed)
  Chief Complaint: knee pain Symptoms: R knee pain, 8-9/10, having to walk with a cane today d/t pain, swelling Frequency: ongoing since 02/02/23 Pertinent Negatives: Patient denies redness Disposition: [] ED /[] Urgent Care (no appt availability in office) / [x] Appointment(In office/virtual)/ []  [facility]/ [] Home Care/ [] Refused Recommended Disposition /[] [facility]/ []  Follow-up with PCP Additional Notes: pt states he had OV on 02/02/23 with Dr. Laural Benes and was given abx and steroids and the doxycycline he feels that is causing muscle cramps and leg pain, wanting to come in sooner for steroid injection. Has appt scheduled for 02/27/23 currently, advised Dr. Laural Benes off today and tomorrow, can schedule with Denny Peon, PA but pt states he was told Dr. Laural Benes only one who can do injection so called and spoke with Iris, FC. She is going to send message to [place], [place] and check and would call pt back, advised pt of this and he preferred to just schedule with Dr. Laural Benes for sooner appt, rescheduled for 02/19/23 at 1300.   Reason for Disposition  [1] MODERATE pain (e.g., interferes with normal activities, limping) AND [2] present > 3 days  Answer Assessment - Initial Assessment Questions 1. LOCATION and RADIATION: "Where is the pain located?"      R knee  3. SEVERITY: "How bad is the pain?" "What does it keep you from doing?"   (Scale 1-10; or mild, moderate, severe)   -  MILD (1-3): doesn't interfere with normal activities    -  MODERATE (4-7): interferes with normal activities (e.g., work or school) or awakens from sleep, limping    -  SEVERE (8-10): excruciating pain, unable to do any normal activities, unable to walk     8-9/10 4. ONSET: "When did the pain start?" "Does it come and go, or is it there all the time?"     02/02/23 6. SETTING: "Has there been any recent work, exercise or other activity that involved that part of the body?"      no 7. AGGRAVATING FACTORS: "What makes  the knee pain worse?" (e.g., walking, climbing stairs, running)     Having to walk with a cane  8. ASSOCIATED SYMPTOMS: "Is there any swelling or redness of the knee?"     Swelling  9. OTHER SYMPTOMS: "Do you have any other symptoms?" (e.g., chest pain, difficulty breathing, fever, calf pain)  Protocols used: Knee Pain-A-AH

## 2023-02-15 NOTE — Progress Notes (Signed)
Cardiology Office Note   Date:  02/15/2023   ID:  Ricky Melton, DOB 27-Mar-1951, MRN [id number]  PCP:  Dorcas Carrow, DO  Cardiologist:  Adrian Blackwater, MD      History of Present Illness: Ricky Melton is a 72 y.o. male who presents for  Chief Complaint  Patient presents with   Follow-up    2 month follow up    Patient in office for routine cardiac exam. Denies chest pain, edema, palpitations. Patient reports shortness of breath with heavy exertion.     Past Medical History:  Diagnosis Date   Benign hematuria    CAD (coronary artery disease)    Chronic kidney disease    Hyperlipidemia    Hypertension      Past Surgical History:  Procedure Laterality Date   cardiac stents     x2   COLONOSCOPY WITH PROPOFOL N/A 05/10/2018   Procedure: COLONOSCOPY WITH PROPOFOL;  Surgeon: Wyline Mood, MD;  Location: [facility] ENDOSCOPY;  Service: Gastroenterology;  Laterality: N/A;   EYE SURGERY Right 2021   cataract sx in [place]      Current Outpatient Medications  Medication Sig Dispense Refill   allopurinol (ZYLOPRIM) 300 MG tablet Take 0.5 tablets (150 mg total) by mouth daily. 100 tablet 0   amLODipine (NORVASC) 10 MG tablet TAKE 1 TABLET BY MOUTH IN THE  EVENING 100 tablet 0   aspirin 81 MG tablet Take 81 mg by mouth daily.     benazepril (LOTENSIN) 40 MG tablet Take 1 tablet (40 mg total) by mouth daily. 90 tablet 1   carvedilol (COREG) 25 MG tablet Take 1 tablet (25 mg total) by mouth 2 (two) times daily with a meal. 60 tablet 2   clopidogrel (PLAVIX) 75 MG tablet TAKE 1 TABLET BY MOUTH DAILY 100 tablet 0   diclofenac Sodium (VOLTAREN) 1 % GEL Apply 4 g topically 4 (four) times daily. 100 g 3   doxycycline (VIBRA-TABS) 100 MG tablet Take 1 tablet (100 mg total) by mouth 2 (two) times daily. 42 tablet 0   levothyroxine (SYNTHROID) 50 MCG tablet TAKE 1 TABLET BY MOUTH DAILY 100 tablet 1   prednisoLONE acetate (PRED FORTE) 1 % ophthalmic suspension Place 1 drop into the  right eye 3 (three) times daily.     rosuvastatin (CRESTOR) 40 MG tablet TAKE 1 TABLET BY MOUTH DAILY 100 tablet 0   No current facility-administered medications for this visit.    Allergies:   Hydrochlorothiazide    Social History:   reports that he has never smoked. He quit smokeless tobacco use about 12 years ago.  His smokeless tobacco use included chew. He reports that he does not drink alcohol and does not use drugs.   Family History:  family history includes Diabetes in his sister and son; Hypertension in his mother.    ROS:     Review of Systems  Constitutional: Negative.   HENT: Negative.    Eyes: Negative.   Respiratory:  Positive for shortness of breath.   Cardiovascular: Negative.   Gastrointestinal: Negative.   Genitourinary: Negative.   Musculoskeletal: Negative.   Skin: Negative.   Neurological: Negative.   Endo/Heme/Allergies: Negative.   Psychiatric/Behavioral: Negative.    All other systems reviewed and are negative.    All other systems are reviewed and negative.    PHYSICAL EXAM: VS:  BP 128/65 (BP Location: Left Arm, Patient Position: Sitting, Cuff Size: Large)   Pulse 60   Ht 5\' 7"  (  1.702 m)   Wt 228 lb 6.4 oz (103.6 kg)   SpO2 98%   BMI 35.77 kg/m  , BMI Body mass index is 35.77 kg/m. Last weight:  Wt Readings from Last 3 Encounters:  02/15/23 228 lb 6.4 oz (103.6 kg)  02/02/23 232 lb 12.8 oz (105.6 kg)  01/08/23 237 lb (107.5 kg)    Physical Exam Vitals reviewed.  Constitutional:      Appearance: Normal appearance. He is normal weight.  HENT:     Head: Normocephalic.     Nose: Nose normal.     Mouth/Throat:     Mouth: Mucous membranes are moist.  Eyes:     Pupils: Pupils are equal, round, and reactive to light.  Cardiovascular:     Rate and Rhythm: Normal rate and regular rhythm.     Pulses: Normal pulses.     Heart sounds: Normal heart sounds.  Pulmonary:     Effort: Pulmonary effort is normal.  Abdominal:     General:  Abdomen is flat. Bowel sounds are normal.  Musculoskeletal:        General: Normal range of motion.     Cervical back: Normal range of motion.  Skin:    General: Skin is warm.  Neurological:     General: No focal deficit present.     Mental Status: He is alert.  Psychiatric:        Mood and Affect: Mood normal.      EKG: none today  Recent Labs: 07/13/2022: Hemoglobin 14.9; Platelets 225 09/28/2022: TSH 2.790 02/02/2023: ALT 11; BUN 24; Creatinine, Ser 1.52; Potassium 5.3; Sodium 141    Lipid Panel    Component Value Date/Time   CHOL 127 07/13/2022 0832   CHOL 98 05/14/2019 1300   TRIG 103 07/13/2022 0832   TRIG 205 (H) 05/14/2019 1300   HDL 43 07/13/2022 0832   CHOLHDL 2.9 05/24/2021 0823   VLDL 41 (H) 05/14/2019 1300   LDLCALC 65 07/13/2022 0832     ASSESSMENT AND PLAN:    ICD-10-CM   1. Essential hypertension  I10 PCV ECHOCARDIOGRAM COMPLETE    2. 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 Mixed hyperlipidemia  E78.2     4. Morbid obesity (HCC)  E66.01        Problem List Items Addressed This Visit       Cardiovascular and Mediastinum   CAD (coronary artery disease)    CCTA 07/2018 RCA distally in PDA and PLV calcified. Stent in mid LAD has no significant restenosis but has mild disease prior to stent. LCX has no significant disease. Will schedule stress test and echo prior to next visit.       Relevant Orders   MYOCARDIAL PERFUSION IMAGING   Essential hypertension - Primary    B/p well controlled. Continue to monitor b/p at home. Continue same medications.       Relevant Orders   PCV ECHOCARDIOGRAM COMPLETE     Other   Hyperlipidemia    LDL 07/2022 65, continue current regimen.       Morbid obesity (HCC)    The patient is asked to make an attempt to improve diet and exercise patterns to aid in medical management of this problem.        Disposition:   Return in about 2 months (around 04/17/2023) for echo and stress test prior.    Total time spent: 30 minutes  Signed,  Adrian Blackwater, MD  02/15/2023 9:43 AM    [facility]

## 2023-02-15 NOTE — Assessment & Plan Note (Signed)
LDL 07/2022 65, continue current regimen.

## 2023-02-19 ENCOUNTER — Encounter: Payer: Self-pay | Admitting: Family Medicine

## 2023-02-19 ENCOUNTER — Ambulatory Visit (INDEPENDENT_AMBULATORY_CARE_PROVIDER_SITE_OTHER): Payer: Medicare Other | Admitting: Family Medicine

## 2023-02-19 VITALS — BP 138/68 | HR 60 | Temp 98.7°F | Wt 229.4 lb

## 2023-02-19 DIAGNOSIS — I10 Essential (primary) hypertension: Secondary | ICD-10-CM

## 2023-02-19 DIAGNOSIS — M1000 Idiopathic gout, unspecified site: Secondary | ICD-10-CM | POA: Diagnosis not present

## 2023-02-19 DIAGNOSIS — E782 Mixed hyperlipidemia: Secondary | ICD-10-CM

## 2023-02-19 DIAGNOSIS — I129 Hypertensive chronic kidney disease with stage 1 through stage 4 chronic kidney disease, or unspecified chronic kidney disease: Secondary | ICD-10-CM

## 2023-02-19 DIAGNOSIS — N182 Chronic kidney disease, stage 2 (mild): Secondary | ICD-10-CM | POA: Diagnosis not present

## 2023-02-19 DIAGNOSIS — E039 Hypothyroidism, unspecified: Secondary | ICD-10-CM | POA: Diagnosis not present

## 2023-02-19 DIAGNOSIS — M25561 Pain in right knee: Secondary | ICD-10-CM | POA: Diagnosis not present

## 2023-02-19 DIAGNOSIS — M109 Gout, unspecified: Secondary | ICD-10-CM

## 2023-02-19 DIAGNOSIS — E785 Hyperlipidemia, unspecified: Secondary | ICD-10-CM | POA: Diagnosis not present

## 2023-02-19 DIAGNOSIS — R7303 Prediabetes: Secondary | ICD-10-CM

## 2023-02-19 LAB — BAYER DCA HB A1C WAIVED: HB A1C (BAYER DCA - WAIVED): 6 % — ABNORMAL HIGH (ref 4.8–5.6)

## 2023-02-19 MED ORDER — BENAZEPRIL HCL 40 MG PO TABS: 40.0000 mg | ORAL_TABLET | Freq: Every day | ORAL | 1 refills | Status: DC

## 2023-02-19 MED ORDER — ALLOPURINOL 300 MG PO TABS: 150.0000 mg | ORAL_TABLET | Freq: Every day | ORAL | 1 refills | Status: DC

## 2023-02-19 MED ORDER — ROSUVASTATIN CALCIUM 40 MG PO TABS
40.0000 mg | ORAL_TABLET | Freq: Every day | ORAL | 1 refills | Status: DC
Start: 2023-02-19 — End: 2023-09-18

## 2023-02-19 MED ORDER — AMLODIPINE BESYLATE 10 MG PO TABS: 10.0000 mg | ORAL_TABLET | Freq: Every evening | ORAL | 1 refills | Status: DC

## 2023-02-19 NOTE — Assessment & Plan Note (Signed)
Under good control on current regimen. Continue current regimen. Continue to monitor. Call with any concerns. Refills given. Labs drawn today.   

## 2023-02-19 NOTE — Assessment & Plan Note (Signed)
Rechecking labs today. Await results. Treat as needed.  °

## 2023-02-19 NOTE — Progress Notes (Signed)
BP 138/68 (BP Location: Left Arm, Cuff Size: Normal)   Pulse 60   Temp 98.7 F (37.1 C) (Oral)   Wt 229 lb 6.4 oz (104.1 kg)   SpO2 98%   BMI 35.93 kg/m    Subjective:    Patient ID: Ricky Melton, male    DOB: 09-May-1951, 72 y.o.   MRN: [id number]  HPI: Ricky Melton is a 72 y.o. male  Chief Complaint  Patient presents with   Knee Pain    Patient says his knee is about the same since the last visit. Patient says he notices some swelling on the side of the knee cap.    KNEE PAIN Duration: about 5 weeks Involved knee: right Mechanism of injury: unknown Location: medial  Onset: gradual Severity: moderate  Quality:  tooth ache, nagging Frequency: constant Radiation: up towards the hip Aggravating factors: moving, being cold  Alleviating factors: tylenol arthritis, steroids  Status: better Treatments attempted: rest, ice, heat, APAP, steroids  Relief with NSAIDs?:  No NSAIDs Taken Weakness with weight bearing or walking: yes Sensation of giving way: yes Locking: no Popping: no Bruising: no Swelling: yes Redness: no Paresthesias/decreased sensation: no Fevers: no  Impaired Fasting Glucose HbA1C:  Lab Results  Component Value Date   HGBA1C 5.7 (H) 07/13/2022   Duration of elevated blood sugar: chronic Polydipsia: no Polyuria: no Weight change: no Visual disturbance: no Glucose Monitoring: no Diabetic Education: Not Completed Family history of diabetes: yes  No gout flares. Tolerating medicine well.   HYPERTENSION / HYPERLIPIDEMIA Satisfied with current treatment? yes Duration of hypertension: chronic BP monitoring frequency: not checking BP medication side effects: no Past BP meds: amlodipine, benazepril, carvedilol Duration of hyperlipidemia: chronic Cholesterol medication side effects: no Cholesterol supplements: none Past cholesterol medications: crestor Medication compliance: excellent compliance Aspirin: yes Recent stressors: no Recurrent  headaches: no Visual changes: no Palpitations: no Dyspnea: no Chest pain: no Lower extremity edema: no Dizzy/lightheaded: no  Relevant past medical, surgical, family and social history reviewed and updated as indicated. Interim medical history since our last visit reviewed. Allergies and medications reviewed and updated.  Review of Systems  Constitutional: Negative.   Respiratory: Negative.    Cardiovascular: Negative.   Gastrointestinal: Negative.   Musculoskeletal:  Positive for arthralgias and joint swelling. Negative for back pain, gait problem, myalgias, neck pain and neck stiffness.  Skin: Negative.   Neurological: Negative.   Psychiatric/Behavioral: Negative.      Per HPI unless specifically indicated above     Objective:    BP 138/68 (BP Location: Left Arm, Cuff Size: Normal)   Pulse 60   Temp 98.7 F (37.1 C) (Oral)   Wt 229 lb 6.4 oz (104.1 kg)   SpO2 98%   BMI 35.93 kg/m   Wt Readings from Last 3 Encounters:  02/19/23 229 lb 6.4 oz (104.1 kg)  02/15/23 228 lb 6.4 oz (103.6 kg)  02/02/23 232 lb 12.8 oz (105.6 kg)    Physical Exam Vitals and nursing note reviewed.  Constitutional:      General: He is not in acute distress.    Appearance: Normal appearance. He is obese. He is not ill-appearing, toxic-appearing or diaphoretic.  HENT:     Head: Normocephalic and atraumatic.     Right Ear: External ear normal.     Left Ear: External ear normal.     Nose: Nose normal.     Mouth/Throat:     Mouth: Mucous membranes are moist.     Pharynx:  Oropharynx is clear.  Eyes:     General: No scleral icterus.       Right eye: No discharge.        Left eye: No discharge.     Extraocular Movements: Extraocular movements intact.     Conjunctiva/sclera: Conjunctivae normal.     Pupils: Pupils are equal, round, and reactive to light.  Cardiovascular:     Rate and Rhythm: Normal rate and regular rhythm.     Pulses: Normal pulses.     Heart sounds: Normal heart sounds.  No murmur heard.    No friction rub. No gallop.  Pulmonary:     Effort: Pulmonary effort is normal. No respiratory distress.     Breath sounds: Normal breath sounds. No stridor. No wheezing, rhonchi or rales.  Chest:     Chest wall: No tenderness.  Musculoskeletal:        General: Swelling and tenderness (medial R knee) present. No deformity or signs of injury.     Cervical back: Normal range of motion and neck supple.     Right lower leg: No edema.     Left lower leg: No edema.  Skin:    General: Skin is warm and dry.     Capillary Refill: Capillary refill takes less than 2 seconds.     Coloration: Skin is not jaundiced or pale.     Findings: No bruising, erythema, lesion or rash.  Neurological:     General: No focal deficit present.     Mental Status: He is alert and oriented to person, place, and time. Mental status is at baseline.  Psychiatric:        Mood and Affect: Mood normal.        Behavior: Behavior normal.        Thought Content: Thought content normal.        Judgment: Judgment normal.     Results for orders placed or performed in visit on 02/02/23  Uric acid  Result Value Ref Range   Uric Acid 5.7 3.8 - 8.4 mg/dL  Comprehensive metabolic panel  Result Value Ref Range   Glucose 100 (H) 70 - 99 mg/dL   BUN 24 8 - 27 mg/dL   Creatinine, Ser 1.61 (H) 0.76 - 1.27 mg/dL   eGFR 48 (L) >09 UE/AVW/0.98   BUN/Creatinine Ratio 16 10 - 24   Sodium 141 134 - 144 mmol/L   Potassium 5.3 (H) 3.5 - 5.2 mmol/L   Chloride 107 (H) 96 - 106 mmol/L   CO2 22 20 - 29 mmol/L   Calcium 9.7 8.6 - 10.2 mg/dL   Total Protein 7.0 6.0 - 8.5 g/dL   Albumin 4.5 3.8 - 4.8 g/dL   Globulin, Total 2.5 1.5 - 4.5 g/dL   Albumin/Globulin Ratio 1.8 1.2 - 2.2   Bilirubin Total 0.4 0.0 - 1.2 mg/dL   Alkaline Phosphatase 55 44 - 121 IU/L   AST 16 0 - 40 IU/L   ALT 11 0 - 44 IU/L  Lyme Disease Serology w/Reflex  Result Value Ref Range   Lyme Total Antibody EIA Negative Negative  Spotted Fever  Group Antibodies  Result Value Ref Range   Spotted Fever Group IgG 1:256 (H) Neg:<1:64   Spotted Fever Group IgM <1:64 Neg:<1:64   Result Comment Comment       Assessment & Plan:   Problem List Items Addressed This Visit       Cardiovascular and Mediastinum   Essential hypertension    Under  good control on current regimen. Continue current regimen. Continue to monitor. Call with any concerns. Refills given. Labs drawn today.        Relevant Medications   amLODipine (NORVASC) 10 MG tablet   benazepril (LOTENSIN) 40 MG tablet   rosuvastatin (CRESTOR) 40 MG tablet   Other Relevant Orders   CBC with Differential/Platelet   Comprehensive metabolic panel     Endocrine   Hypothyroidism    Rechecking labs today. Await results. Treat as needed.       Relevant Orders   CBC with Differential/Platelet   Comprehensive metabolic panel   TSH     Genitourinary   CKD (chronic kidney disease), stage II    Rechecking labs today. Await results. Treat as needed.       Relevant Orders   CBC with Differential/Platelet   Comprehensive metabolic panel     Other   Hyperlipidemia    Under good control on current regimen. Continue current regimen. Continue to monitor. Call with any concerns. Refills given. Labs drawn today.       Relevant Medications   amLODipine (NORVASC) 10 MG tablet   benazepril (LOTENSIN) 40 MG tablet   rosuvastatin (CRESTOR) 40 MG tablet   Other Relevant Orders   CBC with Differential/Platelet   Comprehensive metabolic panel   Lipid Panel w/o Chol/HDL Ratio   Gout    Under good control on current regimen. Continue current regimen. Continue to monitor. Call with any concerns. Refills given. Labs drawn today.        Relevant Medications   allopurinol (ZYLOPRIM) 300 MG tablet   Other Relevant Orders   CBC with Differential/Platelet   Comprehensive metabolic panel   Uric acid   Prediabetes    Rechecking labs today. Await results. Treat as needed.        Relevant Orders   Bayer DCA Hb A1c Waived   CBC with Differential/Platelet   Comprehensive metabolic panel   Other Visit Diagnoses     Acute pain of right knee    -  Primary   Better following steroid, but not resolved. Steroid shot given today as below. Call with any concerns or if not getting better.      Procedure: Right  Knee Intraarticular Steroid Injection        Diagnosis:   ICD-10-CM   1. Acute pain of right knee  M25.561    Better following steroid, but not resolved. Steroid shot given today as below. Call with any concerns or if not getting better.    2. Essential hypertension  I10 CBC with Differential/Platelet    Comprehensive metabolic panel    benazepril (LOTENSIN) 40 MG tablet    3. Hypothyroidism, unspecified type  E03.9 CBC with Differential/Platelet    Comprehensive metabolic panel    TSH    4. CKD (chronic kidney disease), stage II  N18.2 CBC with Differential/Platelet    Comprehensive metabolic panel    5. Idiopathic gout, unspecified chronicity, unspecified site  M10.00 CBC with Differential/Platelet    Comprehensive metabolic panel    Uric acid    allopurinol (ZYLOPRIM) 300 MG tablet    6. Mixed hyperlipidemia  E78.2 CBC with Differential/Platelet    Comprehensive metabolic panel    Lipid Panel w/o Chol/HDL Ratio    7. Prediabetes  R73.03 Bayer DCA Hb A1c Waived    CBC with Differential/Platelet    Comprehensive metabolic panel    8. Hyperlipidemia, unspecified hyperlipidemia type  E78.5 rosuvastatin (CRESTOR) 40 MG tablet  Physician: MJ Consent:  Risks, benefits, and alternative treatments discussed and all questions were answered.  Patient elected to proceed and verbal consent obtained.  Description: Area prepped and draped using  semi-sterile technique.  Using a anterior/lateral approach, a mixture of 4 cc of  1% lidocaine & 1 cc of Kenalog 40 was injected into knee joint.  A bandage was then placed over the injection site. Complications:  none Post Procedure Instructions: Wound care instructions discussed and patient was instructed to keep area clean and dry.  Signs and symptoms of infection discussed, patient agrees to contact the office ASAP should they occur.    Follow up plan: Return if symptoms worsen or fail to improve.

## 2023-02-20 LAB — CBC WITH DIFFERENTIAL/PLATELET
Basophils Absolute: 0.1 10*3/uL (ref 0.0–0.2)
Basos: 1 %
EOS (ABSOLUTE): 0.1 10*3/uL (ref 0.0–0.4)
Eos: 1 %
Hematocrit: 44.5 % (ref 37.5–51.0)
Hemoglobin: 14.5 g/dL (ref 13.0–17.7)
Immature Grans (Abs): 0 10*3/uL (ref 0.0–0.1)
Immature Granulocytes: 0 %
Lymphocytes Absolute: 2.4 10*3/uL (ref 0.7–3.1)
Lymphs: 25 %
MCH: 28.2 pg (ref 26.6–33.0)
MCHC: 32.6 g/dL (ref 31.5–35.7)
MCV: 87 fL (ref 79–97)
Monocytes Absolute: 0.8 10*3/uL (ref 0.1–0.9)
Monocytes: 8 %
Neutrophils Absolute: 6.4 10*3/uL (ref 1.4–7.0)
Neutrophils: 65 %
Platelets: 219 10*3/uL (ref 150–450)
RBC: 5.14 x10E6/uL (ref 4.14–5.80)
RDW: 14.3 % (ref 11.6–15.4)
WBC: 9.8 10*3/uL (ref 3.4–10.8)

## 2023-02-20 LAB — COMPREHENSIVE METABOLIC PANEL
ALT: 22 IU/L (ref 0–44)
AST: 18 IU/L (ref 0–40)
Albumin/Globulin Ratio: 1.9
Albumin: 4.4 g/dL (ref 3.8–4.8)
Alkaline Phosphatase: 64 IU/L (ref 44–121)
BUN/Creatinine Ratio: 22 (ref 10–24)
BUN: 41 mg/dL — ABNORMAL HIGH (ref 8–27)
Bilirubin Total: 0.5 mg/dL (ref 0.0–1.2)
CO2: 18 mmol/L — ABNORMAL LOW (ref 20–29)
Calcium: 9.8 mg/dL (ref 8.6–10.2)
Chloride: 107 mmol/L — ABNORMAL HIGH (ref 96–106)
Creatinine, Ser: 1.84 mg/dL — ABNORMAL HIGH (ref 0.76–1.27)
Globulin, Total: 2.3 g/dL (ref 1.5–4.5)
Glucose: 92 mg/dL (ref 70–99)
Potassium: 5 mmol/L (ref 3.5–5.2)
Sodium: 138 mmol/L (ref 134–144)
Total Protein: 6.7 g/dL (ref 6.0–8.5)
eGFR: 38 mL/min/{1.73_m2} — ABNORMAL LOW (ref >59)

## 2023-02-20 LAB — LIPID PANEL W/O CHOL/HDL RATIO
Cholesterol, Total: 128 mg/dL (ref 100–199)
HDL: 48 mg/dL (ref >39)
LDL Chol Calc (NIH): 60 mg/dL (ref 0–99)
Triglycerides: 107 mg/dL (ref 0–149)
VLDL Cholesterol Cal: 20 mg/dL (ref 5–40)

## 2023-02-20 LAB — TSH: TSH: 2.12 u[IU]/mL (ref 0.450–4.500)

## 2023-02-20 LAB — URIC ACID: Uric Acid: 5.5 mg/dL (ref 3.8–8.4)

## 2023-02-21 ENCOUNTER — Ambulatory Visit: Payer: Self-pay | Admitting: *Deleted

## 2023-02-21 NOTE — Telephone Encounter (Signed)
  Chief Complaint: severe right knee pain Symptoms: right knee pain worsening since injection on Monday in OV inner area of knee. Limping when walking and has to use cane due to pain. Worsening pain with mobility but still has some pain at rest. Has tried small amount of voltaren cream that he had left from previous Rx but no relief. Now out of cream Frequency: today  Pertinent Negatives: Patient denies swelling no redness reported  Disposition: [] ED /[] Urgent Care (no appt availability in office) / [] Appointment(In office/virtual)/ []  [facility]/ [] Home Care/ [] Refused Recommended Disposition /[] [facility]/ [x]  Follow-up with PCP Additional Notes:   Last OV Monday. Please advise if another appt needed. Patient requesting a call back what he can do for severe pain.    Reason for Disposition  [1] SEVERE pain (e.g., excruciating, unable to walk) AND [2] not improved after 2 hours of pain medicine  Answer Assessment - Initial Assessment Questions 1. LOCATION and RADIATION: "Where is the pain located?"      Right knee inner area  2. QUALITY: "What does the pain feel like?"  (e.g., sharp, dull, aching, burning)     'it hurts " 3. SEVERITY: "How bad is the pain?" "What does it keep you from doing?"   (Scale 1-10; or mild, moderate, severe)   -  MILD (1-3): doesn't interfere with normal activities    -  MODERATE (4-7): interferes with normal activities (e.g., work or school) or awakens from sleep, limping    -  SEVERE (8-10): excruciating pain, unable to do any normal activities, unable to walk     Moderate limping severe pain 9/10 4. ONSET: "When did the pain start?" "Does it come and go, or is it there all the time?"     Comes and goes hurts more with weight bearing  5. RECURRENT: "Have you had this pain before?" If Yes, ask: "When, and what happened then?"     Yes injection given Monday in OV 6. SETTING: "Has there been any recent work, exercise or other activity that  involved that part of the body?"      Injection given Monday in OV 7. AGGRAVATING FACTORS: "What makes the knee pain worse?" (e.g., walking, climbing stairs, running)     Walking severe pain , at rest still pain but not severe 8. ASSOCIATED SYMPTOMS: "Is there any swelling or redness of the knee?"     No swelling or redness reported 9. OTHER SYMPTOMS: "Do you have any other symptoms?" (e.g., chest pain, difficulty breathing, fever, calf pain)     Denies  10. PREGNANCY: "Is there any chance you are pregnant?" "When was your last menstrual period?"       na  Protocols used: Knee Pain-A-AH

## 2023-02-22 ENCOUNTER — Telehealth: Payer: Self-pay | Admitting: Family Medicine

## 2023-02-22 ENCOUNTER — Encounter: Payer: Self-pay | Admitting: Family Medicine

## 2023-02-22 ENCOUNTER — Ambulatory Visit (INDEPENDENT_AMBULATORY_CARE_PROVIDER_SITE_OTHER): Payer: Medicare Other | Admitting: Family Medicine

## 2023-02-22 VITALS — BP 156/70 | HR 60 | Temp 98.2°F | Ht 67.0 in | Wt 227.4 lb

## 2023-02-22 DIAGNOSIS — M25561 Pain in right knee: Secondary | ICD-10-CM

## 2023-02-22 MED ORDER — HYDROCODONE-ACETAMINOPHEN 5-325 MG PO TABS: 1.0000 | ORAL_TABLET | Freq: Three times a day (TID) | ORAL | 0 refills | Status: AC | PRN

## 2023-02-22 MED ORDER — AMOXICILLIN-POT CLAVULANATE 875-125 MG PO TABS: 1.0000 | ORAL_TABLET | Freq: Two times a day (BID) | ORAL | 0 refills | Status: DC

## 2023-02-22 MED ORDER — PREDNISONE 50 MG PO TABS: 50.0000 mg | ORAL_TABLET | Freq: Every day | ORAL | 0 refills | Status: DC

## 2023-02-22 MED ORDER — TRIAMCINOLONE ACETONIDE 40 MG/ML IJ SUSP
40.0000 mg | Freq: Once | INTRAMUSCULAR | Status: AC
Start: 2023-02-22 — End: 2023-02-22
  Administered 2023-02-22: 40 mg via INTRAMUSCULAR

## 2023-02-22 NOTE — Progress Notes (Signed)
BP (!) 156/70 (BP Location: Left Arm, Cuff Size: Normal)   Pulse 60   Temp 98.2 F (36.8 C) (Oral)   Ht 5\' 7"  (1.702 m)   Wt 227 lb 6.4 oz (103.1 kg)   SpO2 98%   BMI 35.62 kg/m    Subjective:    Patient ID: Ricky Melton, male    DOB: 03-Sep-1951, 72 y.o.   MRN: [id number]  HPI: Ricky Melton is a 72 y.o. male  Chief Complaint  Patient presents with   Knee Pain    Patient says the injection helped, but he is noticed pain on the inner side of his knee cap. Patient says he could only work 2.5 hrs due to not being able to stand for long periods at a time.     KNEE PAIN Duration: about 5 weeks Involved knee: right Mechanism of injury: unknown Location:medial Onset: sudden Severity: severe  Quality:  sharp and aching Frequency: constant Radiation: up towards the hip Aggravating factors: standing on it  Alleviating factors: nothing  Status: worse Treatments attempted: steroid injection  Relief with NSAIDs?:  No NSAIDs Taken Weakness with weight bearing or walking: no Sensation of giving way: no Locking: no Popping: no Bruising: no Swelling: yes Redness: yes Paresthesias/decreased sensation: no Fevers: no  Relevant past medical, surgical, family and social history reviewed and updated as indicated. Interim medical history since our last visit reviewed. Allergies and medications reviewed and updated.  Review of Systems  Constitutional: Negative.   Respiratory: Negative.    Cardiovascular: Negative.   Musculoskeletal:  Positive for arthralgias and myalgias. Negative for back pain, gait problem, joint swelling, neck pain and neck stiffness.  Skin: Negative.   Neurological: Negative.   Psychiatric/Behavioral: Negative.      Per HPI unless specifically indicated above     Objective:    BP (!) 156/70 (BP Location: Left Arm, Cuff Size: Normal)   Pulse 60   Temp 98.2 F (36.8 C) (Oral)   Ht 5\' 7"  (1.702 m)   Wt 227 lb 6.4 oz (103.1 kg)   SpO2 98%   BMI  35.62 kg/m   Wt Readings from Last 3 Encounters:  02/22/23 227 lb 6.4 oz (103.1 kg)  02/19/23 229 lb 6.4 oz (104.1 kg)  02/15/23 228 lb 6.4 oz (103.6 kg)    Physical Exam Vitals and nursing note reviewed.  Constitutional:      General: He is not in acute distress.    Appearance: Normal appearance. He is not ill-appearing, toxic-appearing or diaphoretic.  HENT:     Head: Normocephalic and atraumatic.     Right Ear: External ear normal.     Left Ear: External ear normal.     Nose: Nose normal.     Mouth/Throat:     Mouth: Mucous membranes are moist.     Pharynx: Oropharynx is clear.  Eyes:     General: No scleral icterus.       Right eye: No discharge.        Left eye: No discharge.     Extraocular Movements: Extraocular movements intact.     Conjunctiva/sclera: Conjunctivae normal.     Pupils: Pupils are equal, round, and reactive to light.  Cardiovascular:     Rate and Rhythm: Normal rate and regular rhythm.     Pulses: Normal pulses.     Heart sounds: Normal heart sounds. No murmur heard.    No friction rub. No gallop.  Pulmonary:     Effort: Pulmonary  effort is normal. No respiratory distress.     Breath sounds: Normal breath sounds. No stridor. No wheezing, rhonchi or rales.  Chest:     Chest wall: No tenderness.  Musculoskeletal:        General: Tenderness (to medial side of his knee, mildly warm) present. No swelling, deformity or signs of injury.     Cervical back: Normal range of motion and neck supple.     Right lower leg: No edema.     Left lower leg: No edema.  Skin:    General: Skin is warm and dry.     Capillary Refill: Capillary refill takes less than 2 seconds.     Coloration: Skin is not jaundiced or pale.     Findings: No bruising, erythema, lesion or rash.  Neurological:     General: No focal deficit present.     Mental Status: He is alert and oriented to person, place, and time. Mental status is at baseline.  Psychiatric:        Mood and Affect:  Mood normal.        Behavior: Behavior normal.        Thought Content: Thought content normal.        Judgment: Judgment normal.     Results for orders placed or performed in visit on 02/19/23  Bayer DCA Hb A1c Waived  Result Value Ref Range   HB A1C (BAYER DCA - WAIVED) 6.0 (H) 4.8 - 5.6 %  CBC with Differential/Platelet  Result Value Ref Range   WBC 9.8 3.4 - 10.8 x10E3/uL   RBC 5.14 4.14 - 5.80 x10E6/uL   Hemoglobin 14.5 13.0 - 17.7 g/dL   Hematocrit 16.1 09.6 - 51.0 %   MCV 87 79 - 97 fL   MCH 28.2 26.6 - 33.0 pg   MCHC 32.6 31.5 - 35.7 g/dL   RDW 04.5 40.9 - 81.1 %   Platelets 219 150 - 450 x10E3/uL   Neutrophils 65 Not Estab. %   Lymphs 25 Not Estab. %   Monocytes 8 Not Estab. %   Eos 1 Not Estab. %   Basos 1 Not Estab. %   Neutrophils Absolute 6.4 1.4 - 7.0 x10E3/uL   Lymphocytes Absolute 2.4 0.7 - 3.1 x10E3/uL   Monocytes Absolute 0.8 0.1 - 0.9 x10E3/uL   EOS (ABSOLUTE) 0.1 0.0 - 0.4 x10E3/uL   Basophils Absolute 0.1 0.0 - 0.2 x10E3/uL   Immature Granulocytes 0 Not Estab. %   Immature Grans (Abs) 0.0 0.0 - 0.1 x10E3/uL  Comprehensive metabolic panel  Result Value Ref Range   Glucose 92 70 - 99 mg/dL   BUN 41 (H) 8 - 27 mg/dL   Creatinine, Ser 9.14 (H) 0.76 - 1.27 mg/dL   eGFR 38 (L) >78 GN/FAO/1.30   BUN/Creatinine Ratio 22 10 - 24   Sodium 138 134 - 144 mmol/L   Potassium 5.0 3.5 - 5.2 mmol/L   Chloride 107 (H) 96 - 106 mmol/L   CO2 18 (L) 20 - 29 mmol/L   Calcium 9.8 8.6 - 10.2 mg/dL   Total Protein 6.7 6.0 - 8.5 g/dL   Albumin 4.4 3.8 - 4.8 g/dL   Globulin, Total 2.3 1.5 - 4.5 g/dL   Albumin/Globulin Ratio 1.9    Bilirubin Total 0.5 0.0 - 1.2 mg/dL   Alkaline Phosphatase 64 44 - 121 IU/L   AST 18 0 - 40 IU/L   ALT 22 0 - 44 IU/L  Lipid Panel w/o Chol/HDL Ratio  Result  Value Ref Range   Cholesterol, Total 128 100 - 199 mg/dL   Triglycerides 409 0 - 149 mg/dL   HDL 48 >81 mg/dL   VLDL Cholesterol Cal 20 5 - 40 mg/dL   LDL Chol Calc (NIH) 60 0 - 99  mg/dL   LDL CALC COMMENT: CANCELED   TSH  Result Value Ref Range   TSH 2.120 0.450 - 4.500 uIU/mL  Uric acid  Result Value Ref Range   Uric Acid 5.5 3.8 - 8.4 mg/dL      Assessment & Plan:   Problem List Items Addressed This Visit   None Visit Diagnoses     Acute pain of right knee    -  Primary   Will treat with prednisone and augmentin. Call if not getting better or getting worse and we'll get him into ortho.   Relevant Medications   triamcinolone acetonide (KENALOG-40) injection 40 mg (Completed) (Start on 02/22/2023  4:30 PM)        Follow up plan: Return As scheduled.

## 2023-02-22 NOTE — Patient Instructions (Addendum)
Start augmentin tonight After the stress test tomorrow- take prednisone and augmentin If NOT better in 24 hours on the prednisone and augmentin- start norco (pain medicine)  I'll call Monday if you're not better we'll get you into see the orthopedist

## 2023-02-22 NOTE — Telephone Encounter (Signed)
Copied from CRM [id number]. Topic: General - Inquiry >> Feb 22, 2023  9:21 AM De Blanch wrote: Reason for CRM: Pt calling to f/u on TE from NT from 06/12. Pt asked if Dr.Johnson will send medication or if he has to keep his appointment for this afternoon.  Please advise.

## 2023-02-22 NOTE — Telephone Encounter (Signed)
I will see him 

## 2023-02-23 ENCOUNTER — Ambulatory Visit (INDEPENDENT_AMBULATORY_CARE_PROVIDER_SITE_OTHER): Payer: Medicare Other

## 2023-02-23 DIAGNOSIS — I25738 Atherosclerosis of nonautologous biological coronary artery bypass graft(s) with other forms of angina pectoris: Secondary | ICD-10-CM | POA: Diagnosis not present

## 2023-02-23 MED ORDER — TECHNETIUM TC 99M SESTAMIBI GENERIC - CARDIOLITE
32.1000 | Freq: Once | INTRAVENOUS | Status: AC | PRN
Start: 2023-02-23 — End: 2023-02-23
  Administered 2023-02-23: 32.1 via INTRAVENOUS

## 2023-02-23 MED ORDER — TECHNETIUM TC 99M SESTAMIBI GENERIC - CARDIOLITE
10.7000 | Freq: Once | INTRAVENOUS | Status: AC | PRN
Start: 2023-02-23 — End: 2023-02-23
  Administered 2023-02-23: 10.7 via INTRAVENOUS

## 2023-02-26 ENCOUNTER — Telehealth: Payer: Self-pay | Admitting: Family Medicine

## 2023-02-26 NOTE — Telephone Encounter (Signed)
Can we please reach out and see how his knee is feeling?

## 2023-02-26 NOTE — Telephone Encounter (Signed)
-----   Message from Dorcas Carrow, DO sent at 02/22/2023  4:20 PM EDT ----- Check in on how his knee is doing

## 2023-02-27 ENCOUNTER — Ambulatory Visit: Payer: Medicare Other | Admitting: Family Medicine

## 2023-02-27 NOTE — Telephone Encounter (Signed)
Spoke with patient to follow up and see how he was doing and patient says he is doing a little better. Patient says he is not walking with his cane anymore. Advised patient to give our office a call back if he has any more pain or discomfort to arise. Patient verbalized understanding and has no further questions.

## 2023-02-28 ENCOUNTER — Ambulatory Visit (INDEPENDENT_AMBULATORY_CARE_PROVIDER_SITE_OTHER): Payer: Medicare Other

## 2023-02-28 DIAGNOSIS — I361 Nonrheumatic tricuspid (valve) insufficiency: Secondary | ICD-10-CM

## 2023-02-28 DIAGNOSIS — I340 Nonrheumatic mitral (valve) insufficiency: Secondary | ICD-10-CM

## 2023-02-28 DIAGNOSIS — I10 Essential (primary) hypertension: Secondary | ICD-10-CM

## 2023-02-28 DIAGNOSIS — I351 Nonrheumatic aortic (valve) insufficiency: Secondary | ICD-10-CM | POA: Diagnosis not present

## 2023-02-28 DIAGNOSIS — I371 Nonrheumatic pulmonary valve insufficiency: Secondary | ICD-10-CM | POA: Diagnosis not present

## 2023-03-01 ENCOUNTER — Other Ambulatory Visit: Payer: Medicare Other

## 2023-03-06 ENCOUNTER — Ambulatory Visit (INDEPENDENT_AMBULATORY_CARE_PROVIDER_SITE_OTHER): Payer: Medicare Other | Admitting: Cardiovascular Disease

## 2023-03-06 VITALS — BP 135/70 | HR 65 | Ht 67.0 in | Wt 229.0 lb

## 2023-03-06 DIAGNOSIS — I25738 Atherosclerosis of nonautologous biological coronary artery bypass graft(s) with other forms of angina pectoris: Secondary | ICD-10-CM

## 2023-03-06 DIAGNOSIS — N182 Chronic kidney disease, stage 2 (mild): Secondary | ICD-10-CM

## 2023-03-06 DIAGNOSIS — I10 Essential (primary) hypertension: Secondary | ICD-10-CM

## 2023-03-06 DIAGNOSIS — E782 Mixed hyperlipidemia: Secondary | ICD-10-CM

## 2023-03-08 ENCOUNTER — Encounter: Payer: Self-pay | Admitting: Cardiovascular Disease

## 2023-03-08 NOTE — Progress Notes (Signed)
Cardiology Office Note   Date:  03/08/2023   ID:  Ricky Melton, DOB 1951/04/13, MRN [id number]  PCP:  Dorcas Carrow, DO  Cardiologist:  Adrian Blackwater, MD      History of Present Illness: Ricky Melton is a 72 y.o. male who presents for  Chief Complaint  Patient presents with   Follow-up    NST & ECHO Results    Doing well      Past Medical History:  Diagnosis Date   Benign hematuria    CAD (coronary artery disease)    Chronic kidney disease    Hyperlipidemia    Hypertension      Past Surgical History:  Procedure Laterality Date   cardiac stents     x2   COLONOSCOPY WITH PROPOFOL N/A 05/10/2018   Procedure: COLONOSCOPY WITH PROPOFOL;  Surgeon: Wyline Mood, MD;  Location: [facility] ENDOSCOPY;  Service: Gastroenterology;  Laterality: N/A;   EYE SURGERY Right 2021   cataract sx in       Current Outpatient Medications  Medication Sig Dispense Refill   allopurinol (ZYLOPRIM) 300 MG tablet Take 0.5 tablets (150 mg total) by mouth daily. 100 tablet 1   amLODipine (NORVASC) 10 MG tablet Take 1 tablet (10 mg total) by mouth every evening. 100 tablet 1   amoxicillin-clavulanate (AUGMENTIN) 875-125 MG tablet Take 1 tablet by mouth 2 (two) times daily. 20 tablet 0   aspirin 81 MG tablet Take 81 mg by mouth daily.     benazepril (LOTENSIN) 40 MG tablet Take 1 tablet (40 mg total) by mouth daily. 100 tablet 1   carvedilol (COREG) 25 MG tablet Take 1 tablet (25 mg total) by mouth 2 (two) times daily with a meal. 60 tablet 2   clopidogrel (PLAVIX) 75 MG tablet TAKE 1 TABLET BY MOUTH DAILY 100 tablet 0   diclofenac Sodium (VOLTAREN) 1 % GEL Apply 4 g topically 4 (four) times daily. 100 g 3   levothyroxine (SYNTHROID) 50 MCG tablet TAKE 1 TABLET BY MOUTH DAILY 100 tablet 1   prednisoLONE acetate (PRED FORTE) 1 % ophthalmic suspension Place 1 drop into the right eye 3 (three) times daily.     rosuvastatin (CRESTOR) 40 MG tablet Take 1 tablet (40 mg total) by mouth  daily. 100 tablet 1   No current facility-administered medications for this visit.    Allergies:   Hydrochlorothiazide    Social History:   reports that he has never smoked. He quit smokeless tobacco use about 12 years ago.  His smokeless tobacco use included chew. He reports that he does not drink alcohol and does not use drugs.   Family History:  family history includes Diabetes in his sister and son; Hypertension in his mother.    ROS:     Review of Systems  Constitutional: Negative.   HENT: Negative.    Eyes: Negative.   Respiratory: Negative.    Gastrointestinal: Negative.   Genitourinary: Negative.   Musculoskeletal: Negative.   Skin: Negative.   Neurological: Negative.   Endo/Heme/Allergies: Negative.   Psychiatric/Behavioral: Negative.    All other systems reviewed and are negative.     All other systems are reviewed and negative.    PHYSICAL EXAM: VS:  BP 135/70   Pulse 65   Ht 5\' 7"  (1.702 m)   Wt 229 lb (103.9 kg)   SpO2 98%   BMI 35.87 kg/m  , BMI Body mass index is 35.87 kg/m. Last weight:  Wt Readings from  Last 3 Encounters:  03/06/23 229 lb (103.9 kg)  02/22/23 227 lb 6.4 oz (103.1 kg)  02/19/23 229 lb 6.4 oz (104.1 kg)     Physical Exam Vitals reviewed.  Constitutional:      Appearance: Normal appearance. He is normal weight.  HENT:     Head: Normocephalic.     Nose: Nose normal.     Mouth/Throat:     Mouth: Mucous membranes are moist.  Eyes:     Pupils: Pupils are equal, round, and reactive to light.  Cardiovascular:     Rate and Rhythm: Normal rate and regular rhythm.     Pulses: Normal pulses.     Heart sounds: Normal heart sounds.  Pulmonary:     Effort: Pulmonary effort is normal.  Abdominal:     General: Abdomen is flat. Bowel sounds are normal.  Musculoskeletal:        General: Normal range of motion.     Cervical back: Normal range of motion.  Skin:    General: Skin is warm.  Neurological:     General: No focal deficit  present.     Mental Status: He is alert.  Psychiatric:        Mood and Affect: Mood normal.       EKG:   Recent Labs: 02/19/2023: ALT 22; BUN 41; Creatinine, Ser 1.84; Hemoglobin 14.5; Platelets 219; Potassium 5.0; Sodium 138; TSH 2.120    Lipid Panel    Component Value Date/Time   CHOL 128 02/19/2023 1336   CHOL 98 05/14/2019 1300   TRIG 107 02/19/2023 1336   TRIG 205 (H) 05/14/2019 1300   HDL 48 02/19/2023 1336   CHOLHDL 2.9 05/24/2021 0823   VLDL 41 (H) 05/14/2019 1300   LDLCALC 60 02/19/2023 1336      Other studies Reviewed: Additional studies/ records that were reviewed today include:  Review of the above records demonstrates:       No data to display            ASSESSMENT AND PLAN:    ICD-10-CM   1. 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    2. Essential hypertension  I10     3. CKD (chronic kidney disease), stage II  N18.2     4. Mixed hyperlipidemia  E78.2     5. Morbid obesity (HCC)  E66.01        Problem List Items Addressed This Visit       Cardiovascular and Mediastinum   CAD (coronary artery disease) - Primary   Essential hypertension     Genitourinary   CKD (chronic kidney disease), stage II     Other   Hyperlipidemia   Morbid obesity (HCC)       Disposition:   No follow-ups on file.    Total time spent: 30 minutes  Signed,  Adrian Blackwater, MD  03/08/2023 11:10 AM    [facility]

## 2023-03-10 ENCOUNTER — Other Ambulatory Visit: Payer: Self-pay | Admitting: Family Medicine

## 2023-03-10 DIAGNOSIS — I10 Essential (primary) hypertension: Secondary | ICD-10-CM

## 2023-03-13 NOTE — Telephone Encounter (Signed)
Refused the Coreg 12.5 mg because it was discontinued 02/02/2023.   Dose was changed.

## 2023-03-25 ENCOUNTER — Emergency Department: Payer: Medicare Other

## 2023-03-25 ENCOUNTER — Encounter: Payer: Self-pay | Admitting: Internal Medicine

## 2023-03-25 ENCOUNTER — Other Ambulatory Visit: Payer: Self-pay

## 2023-03-25 ENCOUNTER — Inpatient Hospital Stay
Admission: EM | Admit: 2023-03-25 | Discharge: 2023-03-28 | DRG: 690 | Disposition: A | Discharge status: Home / Self Care | Payer: Medicare Other | Attending: Hospitalist | Admitting: Hospitalist

## 2023-03-25 DIAGNOSIS — R531 Weakness: Secondary | ICD-10-CM | POA: Diagnosis not present

## 2023-03-25 DIAGNOSIS — I10 Essential (primary) hypertension: Secondary | ICD-10-CM | POA: Diagnosis present

## 2023-03-25 DIAGNOSIS — R197 Diarrhea, unspecified: Secondary | ICD-10-CM | POA: Diagnosis not present

## 2023-03-25 DIAGNOSIS — Z1152 Encounter for screening for COVID-19: Secondary | ICD-10-CM

## 2023-03-25 DIAGNOSIS — Z8249 Family history of ischemic heart disease and other diseases of the circulatory system: Secondary | ICD-10-CM | POA: Diagnosis not present

## 2023-03-25 DIAGNOSIS — N400 Enlarged prostate without lower urinary tract symptoms: Secondary | ICD-10-CM | POA: Diagnosis not present

## 2023-03-25 DIAGNOSIS — I129 Hypertensive chronic kidney disease with stage 1 through stage 4 chronic kidney disease, or unspecified chronic kidney disease: Secondary | ICD-10-CM | POA: Diagnosis not present

## 2023-03-25 DIAGNOSIS — E039 Hypothyroidism, unspecified: Secondary | ICD-10-CM | POA: Diagnosis not present

## 2023-03-25 DIAGNOSIS — Z79899 Other long term (current) drug therapy: Secondary | ICD-10-CM

## 2023-03-25 DIAGNOSIS — N182 Chronic kidney disease, stage 2 (mild): Secondary | ICD-10-CM | POA: Diagnosis not present

## 2023-03-25 DIAGNOSIS — N390 Urinary tract infection, site not specified: Secondary | ICD-10-CM | POA: Diagnosis not present

## 2023-03-25 DIAGNOSIS — E785 Hyperlipidemia, unspecified: Secondary | ICD-10-CM | POA: Diagnosis present

## 2023-03-25 DIAGNOSIS — N3000 Acute cystitis without hematuria: Secondary | ICD-10-CM

## 2023-03-25 DIAGNOSIS — A419 Sepsis, unspecified organism: Secondary | ICD-10-CM

## 2023-03-25 DIAGNOSIS — Z6835 Body mass index (BMI) 35.0-35.9, adult: Secondary | ICD-10-CM | POA: Diagnosis not present

## 2023-03-25 DIAGNOSIS — Z79890 Hormone replacement therapy (postmenopausal): Secondary | ICD-10-CM | POA: Diagnosis not present

## 2023-03-25 DIAGNOSIS — R630 Anorexia: Secondary | ICD-10-CM | POA: Diagnosis present

## 2023-03-25 DIAGNOSIS — B9620 Unspecified Escherichia coli [E. coli] as the cause of diseases classified elsewhere: Secondary | ICD-10-CM | POA: Diagnosis present

## 2023-03-25 DIAGNOSIS — I2510 Atherosclerotic heart disease of native coronary artery without angina pectoris: Secondary | ICD-10-CM | POA: Diagnosis present

## 2023-03-25 DIAGNOSIS — Z87891 Personal history of nicotine dependence: Secondary | ICD-10-CM | POA: Diagnosis not present

## 2023-03-25 DIAGNOSIS — R6520 Severe sepsis without septic shock: Secondary | ICD-10-CM | POA: Diagnosis not present

## 2023-03-25 DIAGNOSIS — Z888 Allergy status to other drugs, medicaments and biological substances status: Secondary | ICD-10-CM

## 2023-03-25 DIAGNOSIS — Z955 Presence of coronary angioplasty implant and graft: Secondary | ICD-10-CM | POA: Diagnosis not present

## 2023-03-25 DIAGNOSIS — Z7982 Long term (current) use of aspirin: Secondary | ICD-10-CM

## 2023-03-25 DIAGNOSIS — R918 Other nonspecific abnormal finding of lung field: Secondary | ICD-10-CM | POA: Diagnosis not present

## 2023-03-25 DIAGNOSIS — R54 Age-related physical debility: Secondary | ICD-10-CM | POA: Diagnosis not present

## 2023-03-25 DIAGNOSIS — N3001 Acute cystitis with hematuria: Secondary | ICD-10-CM | POA: Diagnosis not present

## 2023-03-25 LAB — HEPATIC FUNCTION PANEL
ALT: 24 U/L (ref 0–44)
AST: 19 U/L (ref 15–41)
Albumin: 3.5 g/dL (ref 3.5–5.0)
Alkaline Phosphatase: 52 U/L (ref 38–126)
Bilirubin, Direct: 0.2 mg/dL (ref 0.0–0.2)
Indirect Bilirubin: 0.8 mg/dL (ref 0.3–0.9)
Total Bilirubin: 1 mg/dL (ref 0.3–1.2)
Total Protein: 6.9 g/dL (ref 6.5–8.1)

## 2023-03-25 LAB — BASIC METABOLIC PANEL
Anion gap: 10 (ref 5–15)
BUN: 28 mg/dL — ABNORMAL HIGH (ref 8–23)
CO2: 19 mmol/L — ABNORMAL LOW (ref 22–32)
Calcium: 9 mg/dL (ref 8.9–10.3)
Chloride: 107 mmol/L (ref 98–111)
Creatinine, Ser: 1.66 mg/dL — ABNORMAL HIGH (ref 0.61–1.24)
GFR, Estimated: 44 mL/min — ABNORMAL LOW (ref >60)
Glucose, Bld: 95 mg/dL (ref 70–99)
Potassium: 4.4 mmol/L (ref 3.5–5.1)
Sodium: 136 mmol/L (ref 135–145)

## 2023-03-25 LAB — URINALYSIS, ROUTINE W REFLEX MICROSCOPIC
Bilirubin Urine: NEGATIVE
Glucose, UA: NEGATIVE mg/dL
Ketones, ur: NEGATIVE mg/dL
Nitrite: POSITIVE — AB
Protein, ur: 100 mg/dL — AB
Specific Gravity, Urine: 1.013 (ref 1.005–1.030)
WBC, UA: >50 WBC/hpf (ref 0–5)
pH: 5 (ref 5.0–8.0)

## 2023-03-25 LAB — RESP PANEL BY RT-PCR (RSV, FLU A&B, COVID)  RVPGX2
Influenza A by PCR: NEGATIVE
Influenza B by PCR: NEGATIVE
Resp Syncytial Virus by PCR: NEGATIVE
SARS Coronavirus 2 by RT PCR: NEGATIVE

## 2023-03-25 LAB — CBC
HCT: 42.4 % (ref 39.0–52.0)
Hemoglobin: 13.6 g/dL (ref 13.0–17.0)
MCH: 28.3 pg (ref 26.0–34.0)
MCHC: 32.1 g/dL (ref 30.0–36.0)
MCV: 88.3 fL (ref 80.0–100.0)
Platelets: 162 10*3/uL (ref 150–400)
RBC: 4.8 MIL/uL (ref 4.22–5.81)
RDW: 14.8 % (ref 11.5–15.5)
WBC: 17.4 10*3/uL — ABNORMAL HIGH (ref 4.0–10.5)
nRBC: 0 % (ref 0.0–0.2)

## 2023-03-25 LAB — PROCALCITONIN: Procalcitonin: 1.77 ng/mL

## 2023-03-25 LAB — LACTIC ACID, PLASMA: Lactic Acid, Venous: 1.2 mmol/L (ref 0.5–1.9)

## 2023-03-25 MED ORDER — AMLODIPINE BESYLATE 10 MG PO TABS
10.0000 mg | ORAL_TABLET | Freq: Every evening | ORAL | Status: DC
  Administered 2023-03-26 – 2023-03-27 (×2): 10 mg via ORAL
  Filled 2023-03-25 (×2): qty 1

## 2023-03-25 MED ORDER — LACTATED RINGERS IV BOLUS
1000.0000 mL | Freq: Once | INTRAVENOUS | Status: AC
  Administered 2023-03-25: 1000 mL via INTRAVENOUS

## 2023-03-25 MED ORDER — ONDANSETRON HCL 4 MG/2ML IJ SOLN: 4.0000 mg | Freq: Four times a day (QID) | INTRAMUSCULAR | Status: DC | PRN

## 2023-03-25 MED ORDER — SODIUM CHLORIDE 0.9 % IV SOLN: INTRAVENOUS | Status: AC

## 2023-03-25 MED ORDER — LORAZEPAM 0.5 MG PO TABS: 0.5000 mg | ORAL_TABLET | Freq: Two times a day (BID) | ORAL | Status: DC | PRN

## 2023-03-25 MED ORDER — SODIUM CHLORIDE 0.9 % IV BOLUS (SEPSIS)
1000.0000 mL | Freq: Once | INTRAVENOUS | Status: AC
  Administered 2023-03-25: 1000 mL via INTRAVENOUS

## 2023-03-25 MED ORDER — HYDRALAZINE HCL 20 MG/ML IJ SOLN: 5.0000 mg | Freq: Four times a day (QID) | INTRAMUSCULAR | Status: DC | PRN

## 2023-03-25 MED ORDER — LEVOTHYROXINE SODIUM 50 MCG PO TABS
50.0000 ug | ORAL_TABLET | Freq: Every day | ORAL | Status: DC
  Administered 2023-03-26 – 2023-03-28 (×3): 50 ug via ORAL
  Filled 2023-03-25 (×3): qty 1

## 2023-03-25 MED ORDER — SENNOSIDES-DOCUSATE SODIUM 8.6-50 MG PO TABS
1.0000 | ORAL_TABLET | Freq: Every evening | ORAL | Status: DC | PRN
  Administered 2023-03-27: 1 via ORAL
  Filled 2023-03-25: qty 1

## 2023-03-25 MED ORDER — ONDANSETRON HCL 4 MG PO TABS: 4.0000 mg | ORAL_TABLET | Freq: Four times a day (QID) | ORAL | Status: DC | PRN

## 2023-03-25 MED ORDER — CLOPIDOGREL BISULFATE 75 MG PO TABS
75.0000 mg | ORAL_TABLET | Freq: Every day | ORAL | Status: DC
  Administered 2023-03-26 – 2023-03-28 (×3): 75 mg via ORAL
  Filled 2023-03-25 (×3): qty 1

## 2023-03-25 MED ORDER — ASPIRIN 81 MG PO CHEW
81.0000 mg | CHEWABLE_TABLET | Freq: Every day | ORAL | Status: DC
  Administered 2023-03-26 – 2023-03-28 (×3): 81 mg via ORAL
  Filled 2023-03-25 (×3): qty 1

## 2023-03-25 MED ORDER — ROSUVASTATIN CALCIUM 20 MG PO TABS
40.0000 mg | ORAL_TABLET | Freq: Every day | ORAL | Status: DC
  Administered 2023-03-25 – 2023-03-28 (×4): 40 mg via ORAL
  Filled 2023-03-25 (×5): qty 2

## 2023-03-25 MED ORDER — SODIUM CHLORIDE 0.9 % IV SOLN: 2.0000 g | INTRAVENOUS | Status: DC

## 2023-03-25 MED ORDER — SODIUM CHLORIDE 0.9 % IV SOLN
2.0000 g | INTRAVENOUS | Status: DC
  Administered 2023-03-25 – 2023-03-27 (×3): 2 g via INTRAVENOUS
  Filled 2023-03-25 (×3): qty 20

## 2023-03-25 MED ORDER — CARVEDILOL 25 MG PO TABS
25.0000 mg | ORAL_TABLET | Freq: Two times a day (BID) | ORAL | Status: DC
  Administered 2023-03-26 – 2023-03-28 (×5): 25 mg via ORAL
  Filled 2023-03-25: qty 4
  Filled 2023-03-25 (×4): qty 1

## 2023-03-25 MED ORDER — ACETAMINOPHEN 325 MG PO TABS: 650.0000 mg | ORAL_TABLET | Freq: Four times a day (QID) | ORAL | Status: DC | PRN

## 2023-03-25 MED ORDER — ENOXAPARIN SODIUM 40 MG/0.4ML IJ SOSY
40.0000 mg | PREFILLED_SYRINGE | INTRAMUSCULAR | Status: DC
  Administered 2023-03-25: 40 mg via SUBCUTANEOUS
  Filled 2023-03-25: qty 0.4

## 2023-03-25 MED ORDER — ACETAMINOPHEN 650 MG RE SUPP: 650.0000 mg | Freq: Four times a day (QID) | RECTAL | Status: DC | PRN

## 2023-03-25 MED ORDER — ALLOPURINOL 100 MG PO TABS
150.0000 mg | ORAL_TABLET | Freq: Every day | ORAL | Status: DC
  Administered 2023-03-26 – 2023-03-28 (×3): 150 mg via ORAL
  Filled 2023-03-25 (×2): qty 2
  Filled 2023-03-25: qty 0.5

## 2023-03-25 NOTE — Assessment & Plan Note (Addendum)
Continue ASA, Plavix, Coreg Crestor Stable, no chest pain.

## 2023-03-25 NOTE — Assessment & Plan Note (Addendum)
Body mass index is 35.71 kg/m. Complicates overall care and prognosis.  Recommend lifestyle modifications including physical activity and diet for weight loss and overall long-term health.

## 2023-03-25 NOTE — ED Provider Notes (Signed)
[facility] Provider Note    Event Date/Time   First MD Initiated Contact with Patient 03/25/23 1521     (approximate)   History   Chief Complaint Extremity Weakness   HPI  Ricky Melton is a 72 y.o. male with past medical history of hypertension, hyperlipidemia, CAD, and CKD who presents to the ED complaining of weakness.  Patient reports that over the past 2 days he has been feeling increasingly weak with chills and shaking of his whole body intermittently.  He denies having any fevers but has felt generally weak and like his legs are going to give out on him.  He endorses some burning when he urinates as well as discomfort in his lower back, has not had any abdominal pain or flank pain.  He reports some diarrhea but has not had any nausea or vomiting.  He denies any cough, chest pain, shortness of breath.  He is not aware of any sick contacts.     Physical Exam   Triage Vital Signs: ED Triage Vitals [03/25/23 1232]  Encounter Vitals Group     BP 128/66     Systolic BP Percentile      Diastolic BP Percentile      Pulse Rate 82     Resp 18     Temp 99.7 F (37.6 C)     Temp Source Oral     SpO2 96 %     Weight 228 lb (103.4 kg)     Height 5\' 7"  (1.702 m)     Head Circumference      Peak Flow      Pain Score 0     Pain Loc      Pain Education      Exclude from Growth Chart     Most recent vital signs: Vitals:   03/25/23 1530 03/25/23 1638  BP: 116/62   Pulse: 72   Resp: 19   Temp:  99.1 F (37.3 C)  SpO2: 99%     Constitutional: Alert and oriented. Eyes: Conjunctivae are normal. Head: Atraumatic. Nose: No congestion/rhinnorhea. Mouth/Throat: Mucous membranes are moist.  Cardiovascular: Normal rate, regular rhythm. Grossly normal heart sounds.  2+ radial pulses bilaterally. Respiratory: Normal respiratory effort.  No retractions. Lungs CTAB. Gastrointestinal: Soft and nontender.  No CVA tenderness bilaterally.  No  distention. Musculoskeletal: No lower extremity tenderness nor edema.  Neurologic:  Normal speech and language. No gross focal neurologic deficits are appreciated.    ED Results / Procedures / Treatments   Labs (all labs ordered are listed, but only abnormal results are displayed) Labs Reviewed  BASIC METABOLIC PANEL - Abnormal; Notable for the following components:      Result Value   CO2 19 (*)    BUN 28 (*)    Creatinine, Ser 1.66 (*)    GFR, Estimated 44 (*)    All other components within normal limits  CBC - Abnormal; Notable for the following components:   WBC 17.4 (*)    All other components within normal limits  URINALYSIS, ROUTINE W REFLEX MICROSCOPIC - Abnormal; Notable for the following components:   Color, Urine YELLOW (*)    APPearance CLOUDY (*)    Hgb urine dipstick MODERATE (*)    Protein, ur 100 (*)    Nitrite POSITIVE (*)    Leukocytes,Ua LARGE (*)    Bacteria, UA RARE (*)    All other components within normal limits  CULTURE, BLOOD (ROUTINE X 2)  CULTURE, BLOOD (ROUTINE X 2)  RESP PANEL BY RT-PCR (RSV, FLU A&B, COVID)  [id number]  URINE CULTURE  LACTIC ACID, PLASMA  PROCALCITONIN  HEPATIC FUNCTION PANEL  LACTIC ACID, PLASMA  CBG MONITORING, ED     EKG  ED ECG REPORT I, Chesley Noon, the attending physician, personally viewed and interpreted this ECG.   Date: 03/25/2023  EKG Time: 12:38  Rate: 79  Rhythm: normal sinus rhythm  Axis: Normal  Intervals:none  ST&T Change: None  RADIOLOGY Chest x-ray reviewed and interpreted by me with no infiltrate, edema, or effusion.  PROCEDURES:  Critical Care performed: No  Procedures   MEDICATIONS ORDERED IN ED: Medications  cefTRIAXone (ROCEPHIN) 2 g in sodium chloride 0.9 % 100 mL IVPB (has no administration in time range)  lactated ringers bolus 1,000 mL (1,000 mLs Intravenous New Bag/Given 03/25/23 1643)     IMPRESSION / MDM / ASSESSMENT AND PLAN / ED COURSE  I reviewed the triage vital  signs and the nursing notes.                              72 y.o. male with past medical history of hypertension, hyperlipidemia, CAD, and CKD who presents to the ED complaining of chills and weakness for the past 2 days along with dysuria.  Patient's presentation is most consistent with acute presentation with potential threat to life or bodily function.  Differential diagnosis includes, but is not limited to, sepsis, UTI, pyelonephritis, pneumonia, viral illness, AKI, electrolyte abnormality, anemia.  Patient nontoxic-appearing and in no acute distress, vital signs are reassuring.  Initial labs show leukocytosis and stable chronic kidney disease, no significant anemia or electrolyte abnormality noted.  With his chills and leukocytosis, presentation concerning for sepsis and we will further assess with blood cultures and lactic acid, hydrate with IV fluids.  Given reassuring vital signs and no clear source of infection, will hold off on antibiotics for now.  We will add on LFTs and procalcitonin, urinalysis and chest x-ray pending.  Chest x-ray is unremarkable, urinalysis does appear concerning for UTI and we will send for culture.  Patient's procalcitonin noted to be elevated and I am concerned that he is developing sepsis as well as potential bacteremia given description of rigors.  We will treat with IV Rocephin and case discussed with hospitalist for admission.      FINAL CLINICAL IMPRESSION(S) / ED DIAGNOSES   Final diagnoses:  Sepsis without acute organ dysfunction, due to unspecified organism [facility])  Acute cystitis without hematuria     Rx / DC Orders   ED Discharge Orders     None        Note:  This document was prepared using Dragon voice recognition software and may include unintentional dictation errors.   Chesley Noon, MD 03/25/23 1723

## 2023-03-25 NOTE — Assessment & Plan Note (Addendum)
-   Rosuvastatin 40 mg daily resumed 

## 2023-03-25 NOTE — Sepsis Progress Note (Signed)
Sepsis protocol is being followed by [facility]. 

## 2023-03-25 NOTE — Assessment & Plan Note (Addendum)
-   Levothyroxine 50 mcg daily before breakfast resumed 

## 2023-03-25 NOTE — H&P (Signed)
History and Physical   Ricky Melton ZOX:[id number] DOB: 1950-10-10 DOA: 03/25/2023  PCP: Dorcas Carrow, DO  Outpatient Specialists: Dr. Adrian Blackwater, cardiology Patient coming from: Home  I have personally briefly reviewed patient's old medical records in [facility] EMR.  Chief Concern: both leg weakness, chills, 'not feeling well'  HPI: [name] is a 72 year old male with history of morbid obesity, hypertension, CAD status post PCI x 2, hypothyroid, hyperlipidemia, who presents to the emergency department for chief concerns of weakness, chills, subjective fever, loss of appetite and diarrhea.  Vitals in the ED showed temperature of 99.1, respiration rate of 19, heart rate 72, blood pressure 116/62, SpO2 of 99% on room air.  Serum sodium 136, potassium 4.4, chloride 107, bicarb 19, serum creatinine 1.68, BUN of 29, nonfasting blood glucose 95, EGFR 44.    WBC elevated at 17.4, hemoglobin 13.6, platelets 162.  Lactic acid 1.2.  Procalcitonin elevated at 1.77.  UA was positive for large leukocytes and positive for nitrates.  Blood cultures x 2 and urine culture collected and are in process  CXR 2 view: Read as minimal linear scarring or atelectasis at the left lung base.  No acute cardiopulmonary findings.  ED treatment: Ceftriaxone 2 g IV daily, 7 days ordered ----------------------------- At bedside, patient is able to tell me his name, age, location, current calendar year.  He is able to identify that his favorite niece, Leighton Roach is at bedside.  He states that his wife just left to change so that she can stay with him for the night.  He reports that on Friday, he had a BLT sandwich at a place in [place] and since then he has not been feeling well.  On Saturday, 03/24/2023, he developed subjective fever though he did not check his temperature at home. He further endorses chills, dysuria.  He denies blood in his urine and blood in his stool.  He reports the diarrhea started  yesterday as well.  He states that since yesterday, every time he had to urinate, he also had diarrhea.  He states that the bowel movements are watery and yellow in color.  He denies nausea, vomiting, chest pain, shortness of breath, swelling of his lower extremities, syncope, loss of consciousness.  He denies known sick contacts.    He reports poor p.o. intake and lack of appetite for the last 2 days.  Currently, he reports that he feels a little bit better and that the food in the hospital tastes better.  Social history: He lives at home with his wife.  ROS: Constitutional: no weight change, no fever ENT/Mouth: no sore throat, no rhinorrhea Eyes: no eye pain, no vision changes Cardiovascular: no chest pain, no dyspnea,  no edema, no palpitations Respiratory: no cough, no sputum, no wheezing Gastrointestinal: no nausea, no vomiting, + diarrhea, no constipation Genitourinary: no urinary incontinence, + dysuria, no hematuria Musculoskeletal: no arthralgias, no myalgias Skin: no skin lesions, no pruritus, Neuro: + weakness, no loss of consciousness, no syncope Psych: no anxiety, no depression, + decrease appetite Heme/Lymph: no bruising, no bleeding  ED Course: Discussed with emergency medicine provider, patient requiring hospitalization for chief concerns of sepsis, possible bacteremia.  Assessment/Plan  Principal Problem:   Severe sepsis (HCC) Active Problems:   CAD (coronary artery disease)   Hyperlipidemia   Hypothyroidism   Morbid obesity (HCC)   BPH (benign prostatic hyperplasia)   Essential hypertension   Diarrhea   Assessment and Plan:  * Severe sepsis (HCC) Versus sepsis/SIRS  due to infectious process Workup in progress, patient had markedly elevated leukocytosis of 17.4, elevated procalcitonin, and subjective endorsement of fever and chills at home with possible source of urine versus GI/infectious diarrhea Urine culture, blood cultures x 2 are in process Check GI  and C. difficile screening Status post LR 1 L bolus per EDP Ordered additional sodium chloride 1 L on admission followed by sodium chloride 125 mL/h, 1 day ordered Reassuring that patient's lactic acid was negative at 1.2 Elevated procalcitonin, recheck in the a.m.; low clinical suspicion for commune acquired pneumonia as chest x-ray was negative for pneumonia and not endorsing cough or shortness of breath Continue with ceftriaxone 2 g IV daily Patient is maintaining appropriate MAP; goal MAP is greater than 65 Admit to PCU, inpatient  Diarrhea GI panel and C. difficile screening ordered pending collection No antidiarrheal medication will be given until GI panel and C. difficile screening has resulted as negative for infectious diarrhea  Essential hypertension Home carvedilol 25 mg p.o. twice daily with meals, amlodipine 10 mg every evening resumed for 7/15 due to patient presenting with possible severe sepsis And given possible of bacteremia/sepsis, we will have left higher threshold for hypertension control Hydralazine 5 mg IV every 6 hours as needed for SBP greater than 175, 4 days ordered  Morbid obesity (HCC) This meets criteria for morbid obesity based on the presence of 1 or more chronic comorbidities. Patient has BMI > 35 and hypertension, hyperlipidemia. This complicates overall care and prognosis.   Hypothyroidism Levothyroxine 50 mcg daily before breakfast resumed  Hyperlipidemia Rosuvastatin 40 mg daily resumed  CAD (coronary artery disease) Home aspirin 81 mg daily, Plavix 75 mg daily, carvedilol 25 mg p.o. twice daily, rosuvastatin 40 mg daily were resumed on admission No clinical suspicion for ACS  Chart reviewed.   Complete echo on 02/28/2023: Normal left ventricular systolic function.  Mild left ventricular hypertrophy with grade 1 diastolic dysfunction.  DVT prophylaxis: Enoxaparin 40 mg subcutaneous Code Status: Full code Diet: Heart healthy Family  Communication: Updated  patient's favorite niece, Leighton Roach, at bedside Disposition Plan: Pending clinical course Consults called: None at this time Admission status: Inpatient, PCU  Past Medical History:  Diagnosis Date   Benign hematuria    CAD (coronary artery disease)    Chronic kidney disease    Hyperlipidemia    Hypertension    Past Surgical History:  Procedure Laterality Date   cardiac stents     x2   COLONOSCOPY WITH PROPOFOL N/A 05/10/2018   Procedure: COLONOSCOPY WITH PROPOFOL;  Surgeon: Wyline Mood, MD;  Location: [facility] ENDOSCOPY;  Service: Gastroenterology;  Laterality: N/A;   EYE SURGERY Right 2021   cataract sx in [place]    Social History:  reports that he has never smoked. He quit smokeless tobacco use about 12 years ago.  His smokeless tobacco use included chew. He reports that he does not drink alcohol and does not use drugs.  Allergies  Allergen Reactions   Hydrochlorothiazide Other (See Comments)    dizziness   Family History  Problem Relation Age of Onset   Hypertension Mother    Diabetes Sister    Diabetes Son    Family history: Family history reviewed and not pertinent.  Prior to Admission medications   Medication Sig Start Date End Date Taking? Authorizing Provider  allopurinol (ZYLOPRIM) 300 MG tablet Take 0.5 tablets (150 mg total) by mouth daily. Patient taking differently: Take 300 mg by mouth daily. 02/19/23   Olevia Perches P, DO  amLODipine (NORVASC) 10 MG tablet Take 1 tablet (10 mg total) by mouth every evening. 02/19/23   Johnson, Megan P, DO  amoxicillin-clavulanate (AUGMENTIN) 875-125 MG tablet Take 1 tablet by mouth 2 (two) times daily. 02/22/23   Olevia Perches P, DO  aspirin 81 MG tablet Take 81 mg by mouth daily.    [provider]  benazepril (LOTENSIN) 40 MG tablet Take 1 tablet (40 mg total) by mouth daily. 02/19/23   Johnson, Megan P, DO  carvedilol (COREG) 25 MG tablet Take 1 tablet (25 mg total) by mouth 2 (two) times daily  with a meal. 10/19/22   Laurier Nancy, MD  clopidogrel (PLAVIX) 75 MG tablet TAKE 1 TABLET BY MOUTH DAILY 03/13/23   Olevia Perches P, DO  diclofenac Sodium (VOLTAREN) 1 % GEL Apply 4 g topically 4 (four) times daily. 07/13/22   Johnson, Megan P, DO  levothyroxine (SYNTHROID) 50 MCG tablet TAKE 1 TABLET BY MOUTH DAILY 01/30/23   Johnson, Megan P, DO  prednisoLONE acetate (PRED FORTE) 1 % ophthalmic suspension Place 1 drop into the right eye 3 (three) times daily. 02/12/23   [provider]  rosuvastatin (CRESTOR) 40 MG tablet Take 1 tablet (40 mg total) by mouth daily. 02/19/23   Dorcas Carrow, DO   Physical Exam: Vitals:   03/25/23 1232 03/25/23 1530 03/25/23 1638  BP: 128/66 116/62   Pulse: 82 72   Resp: 18 19   Temp: 99.7 F (37.6 C)  99.1 F (37.3 C)  TempSrc: Oral  Oral  SpO2: 96% 99%   Weight: 103.4 kg    Height: 5\' 7"  (1.702 m)     Constitutional: appears age-appropriate, frail, weak Eyes: PERRL, lids and conjunctivae normal ENMT: Mucous membranes are moist. Posterior pharynx clear of any exudate or lesions. Age-appropriate dentition. Hearing appropriate Neck: normal, supple, no masses, no thyromegaly Respiratory: clear to auscultation bilaterally, no wheezing, no crackles. Normal respiratory effort. No accessory muscle use.  Cardiovascular: Regular rate and rhythm, no murmurs / rubs / gallops. No extremity edema. 2+ pedal pulses. No carotid bruits.  Abdomen: Obese abdomen, no tenderness, no masses palpated, no hepatosplenomegaly. Bowel sounds positive.  Musculoskeletal: no clubbing / cyanosis. No joint deformity upper and lower extremities. Good ROM, no contractures, no atrophy. Normal muscle tone.  Skin: no rashes, lesions, ulcers. No induration Neurologic: Sensation intact. Strength 5/5 in all 4.  Psychiatric: Normal judgment and insight. Alert and oriented x 3. Normal mood.   EKG: independently reviewed, showing sinus rhythm with a rate of 79, QTc 385  Chest x-ray  on Admission: I personally reviewed and I agree with radiologist reading as below.  DG Chest 2 View  Result Date: 03/25/2023 CLINICAL DATA:  Weakness EXAM: CHEST - 2 VIEW COMPARISON:  None Available. FINDINGS: The heart size and mediastinal contours are within normal limits. Minimal linear scarring or atelectasis at the left lung base. Lungs are otherwise clear. No pleural effusion or pneumothorax. The visualized skeletal structures are unremarkable. IMPRESSION: Minimal linear scarring or atelectasis at the left lung base. No acute cardiopulmonary findings. Electronically Signed   By: Duanne Guess D.O.   On: 03/25/2023 16:48    Labs on Admission: I have personally reviewed following labs  CBC: Recent Labs  Lab 03/25/23 1239  WBC 17.4*  HGB 13.6  HCT 42.4  MCV 88.3  PLT 162   Basic Metabolic Panel: Recent Labs  Lab 03/25/23 1239  NA 136  K 4.4  CL 107  CO2 19*  GLUCOSE 95  BUN 28*  CREATININE 1.66*  CALCIUM 9.0   GFR: Estimated Creatinine Clearance: 46.1 mL/min (A) (by C-G formula based on SCr of 1.66 mg/dL (H)).  Liver Function Tests: Recent Labs  Lab 03/25/23 1239  AST 19  ALT 24  ALKPHOS 52  BILITOT 1.0  PROT 6.9  ALBUMIN 3.5   Urine analysis:    Component Value Date/Time   COLORURINE YELLOW (A) 03/25/2023 1642   APPEARANCEUR CLOUDY (A) 03/25/2023 1642   APPEARANCEUR Clear 07/13/2022 0830   LABSPEC 1.013 03/25/2023 1642   PHURINE 5.0 03/25/2023 1642   GLUCOSEU NEGATIVE 03/25/2023 1642   HGBUR MODERATE (A) 03/25/2023 1642   BILIRUBINUR NEGATIVE 03/25/2023 1642   BILIRUBINUR Negative 07/13/2022 0830   KETONESUR NEGATIVE 03/25/2023 1642   PROTEINUR 100 (A) 03/25/2023 1642   NITRITE POSITIVE (A) 03/25/2023 1642   LEUKOCYTESUR LARGE (A) 03/25/2023 1642   This document was prepared using Dragon Voice Recognition software and may include unintentional dictation errors.  Dr. Sedalia Muta [facility]  If 7PM-7AM, please contact overnight-coverage  provider If 7AM-7PM, please contact day attending provider [email or web address]  03/25/2023, 6:52 PM

## 2023-03-25 NOTE — Consult Note (Signed)
CODE SEPSIS - PHARMACY COMMUNICATION  **Broad Spectrum Antibiotics should be administered within 1 hour of Sepsis diagnosis**  Time Code Sepsis Called/Page Received: 1711  Antibiotics Ordered: Ceftriaxone  Time of 1st antibiotic administration: 1728  Additional action taken by pharmacy: none  If necessary, Name of Provider/Nurse Contacted: n/a   Lether Tesch Rodriguez-Guzman PharmD, BCPS 03/25/2023 5:17 PM

## 2023-03-25 NOTE — ED Triage Notes (Addendum)
Pt states coming in not feeling well. Pt states Friday he started to have hot and cold spells and bilateral leg weakness. Pt states he has also been shaking and feeling lightheaded. Pt denies being a diabetic. Pt repots that he has lost his appetite as well. Pt reports diearrhea as well.  Pt states that he was bit by a tick several weeks ago and just finished medications for it.

## 2023-03-25 NOTE — Assessment & Plan Note (Addendum)
Sepsis Ruled Out -- pt only had 1 SIRS criteria, leukocytosis. Normal lactic acid. 7/15 - pt improving, WBC and procal trending down. 7/16 - urine culture growing E. coli --Continue IV Rocephin --Follow culture susceptibilities --Monitor fever curve, CBC

## 2023-03-25 NOTE — Assessment & Plan Note (Addendum)
RESOLVED. No diarrhea since admission, only small amounts of mucus passed. --DC C diff and GI panel --DC enteric precautions

## 2023-03-25 NOTE — Assessment & Plan Note (Addendum)
Continue home meds:  Coreg 25 mg BID Amlodipine 10 mg  + Hydralazine 5 mg IV PRN

## 2023-03-25 NOTE — Hospital Course (Addendum)
HPI on admission 03/25/23 by Dr. Sedalia Muta: "[name] is a 72 year old male with history of morbid obesity, hypertension, CAD status post PCI x 2, hypothyroid, hyperlipidemia, who presents to the emergency department for chief concerns of weakness, chills, subjective fever, loss of appetite and diarrhea.  Vitals in the ED showed temperature of 99.1, respiration rate of 19, heart rate 72, blood pressure 116/62, SpO2 of 99% on room air."  Serum sodium 136, potassium 4.4, chloride 107, bicarb 19, serum creatinine 1.68, BUN of 29, nonfasting blood glucose 95, EGFR 44.    WBC elevated at 17.4,  Lactic acid 1.2.  Procalcitonin elevated at 1.77. UA was positive for large leukocytes and positive for nitrates.  Patient was treated with IV Rocephin in the ED, admitted to the hospital for empiric IV antibiotics for UTI pending culture results.

## 2023-03-26 DIAGNOSIS — N3000 Acute cystitis without hematuria: Secondary | ICD-10-CM

## 2023-03-26 LAB — BASIC METABOLIC PANEL
Anion gap: 7 (ref 5–15)
BUN: 28 mg/dL — ABNORMAL HIGH (ref 8–23)
CO2: 20 mmol/L — ABNORMAL LOW (ref 22–32)
Calcium: 8.3 mg/dL — ABNORMAL LOW (ref 8.9–10.3)
Chloride: 112 mmol/L — ABNORMAL HIGH (ref 98–111)
Creatinine, Ser: 1.32 mg/dL — ABNORMAL HIGH (ref 0.61–1.24)
GFR, Estimated: 57 mL/min — ABNORMAL LOW (ref >60)
Glucose, Bld: 96 mg/dL (ref 70–99)
Potassium: 4.1 mmol/L (ref 3.5–5.1)
Sodium: 139 mmol/L (ref 135–145)

## 2023-03-26 LAB — CBC
HCT: 38.5 % — ABNORMAL LOW (ref 39.0–52.0)
Hemoglobin: 12.6 g/dL — ABNORMAL LOW (ref 13.0–17.0)
MCH: 28.4 pg (ref 26.0–34.0)
MCHC: 32.7 g/dL (ref 30.0–36.0)
MCV: 86.9 fL (ref 80.0–100.0)
Platelets: 153 10*3/uL (ref 150–400)
RBC: 4.43 MIL/uL (ref 4.22–5.81)
RDW: 14.7 % (ref 11.5–15.5)
WBC: 13 10*3/uL — ABNORMAL HIGH (ref 4.0–10.5)
nRBC: 0 % (ref 0.0–0.2)

## 2023-03-26 LAB — PROTIME-INR
INR: 1.2 (ref 0.8–1.2)
Prothrombin Time: 15.6 seconds — ABNORMAL HIGH (ref 11.4–15.2)

## 2023-03-26 LAB — PROCALCITONIN: Procalcitonin: 1.13 ng/mL

## 2023-03-26 MED ORDER — ENOXAPARIN SODIUM 60 MG/0.6ML IJ SOSY
50.0000 mg | PREFILLED_SYRINGE | INTRAMUSCULAR | Status: DC
  Administered 2023-03-26 – 2023-03-27 (×2): 50 mg via SUBCUTANEOUS
  Filled 2023-03-26 (×2): qty 0.6

## 2023-03-26 MED ORDER — ORAL CARE MOUTH RINSE: 15.0000 mL | OROMUCOSAL | Status: DC | PRN

## 2023-03-26 NOTE — Progress Notes (Signed)
Arrived to unit from ER via bed, in stable condition with family at bedside

## 2023-03-26 NOTE — ED Notes (Signed)
Pt reports gel-like substance from rectum when urinating.

## 2023-03-26 NOTE — Progress Notes (Signed)
Progress Note   Patient: Ricky Melton JXB:[id number] DOB: 24-May-1951 DOA: 03/25/2023     1 DOS: the patient was seen and examined on 03/26/2023   Brief hospital course: HPI on admission 03/25/23 by Dr. Sedalia Muta: "[name] is a 72 year old male with history of morbid obesity, hypertension, CAD status post PCI x 2, hypothyroid, hyperlipidemia, who presents to the emergency department for chief concerns of weakness, chills, subjective fever, loss of appetite and diarrhea.  Vitals in the ED showed temperature of 99.1, respiration rate of 19, heart rate 72, blood pressure 116/62, SpO2 of 99% on room air."  Serum sodium 136, potassium 4.4, chloride 107, bicarb 19, serum creatinine 1.68, BUN of 29, nonfasting blood glucose 95, EGFR 44.    WBC elevated at 17.4,  Lactic acid 1.2.  Procalcitonin elevated at 1.77. UA was positive for large leukocytes and positive for nitrates.  Patient was treated with IV Rocephin in the ED, admitted to the hospital for empiric IV antibiotics for UTI pending culture results.   Assessment and Plan: * UTI (urinary tract infection) Sepsis Ruled Out -- pt only had 1 SIRS criteria, leukocytosis. Normal lactic acid. 7/15 - pt improving, WBC and procal trending down. --Continue IV Rocephin --Follow up urine & blood cultures --Monitor fever curve, CBC  Diarrhea No diarrhea since admission, only small amounts of mucus passed. --DC C diff and GI panel --DC enteric precautions  Essential hypertension Continue home meds:  Coreg 25 mg BID Amlodipine 10 mg  + Hydralazine 5 mg IV PRN  BPH (benign prostatic hyperplasia) Appears not on medication. Monitor for symptoms or retention.  Morbid obesity (HCC) Body mass index is 35.71 kg/m. Complicates overall care and prognosis.  Recommend lifestyle modifications including physical activity and diet for weight loss and overall long-term health.  Hypothyroidism Levothyroxine 50 mcg daily    Hyperlipidemia Rosuvastatin 40 mg daily   CAD (coronary artery disease) Continue ASA, Plavix, Coreg Crestor Stable, no chest pain.        Subjective: Pt seen in the ED holding for a bed today, with three family members at bedside.  He reports feeling better since admission.  No fever or chills.  Not having diarrhea, but did pass a tiny amount of mucus in the [facility].  Denies acute complaints.    Physical Exam: Vitals:   03/26/23 0829 03/26/23 1159 03/26/23 1326 03/26/23 1614  BP:  135/65 136/64 (!) 140/69  Pulse:  68 71 69  Resp:  13 17 18   Temp: 98.2 F (36.8 C) 98.5 F (36.9 C) 98.4 F (36.9 C) 98.2 F (36.8 C)  TempSrc:  Oral Oral   SpO2:  96% 98% 98%  Weight:      Height:       General exam: awake, alert, no acute distress HEENT: moist mucus membranes, hearing grossly normal  Respiratory system: CTAB, no wheezes, rales or rhonchi, normal respiratory effort. Cardiovascular system: normal S1/S2,  RRR, no JVD, murmurs, rubs, gallops, no pedal edema.   Gastrointestinal system: soft, NT, ND, no HSM felt, +bowel sounds. Central nervous system: A&O x 4. no gross focal neurologic deficits, normal speech Extremities: moves all, no edema, normal tone Skin: dry, intact, normal temperature Psychiatry: normal mood, congruent affect, judgement and insight appear normal   Data Reviewed:  Notable labs --- Cl 112, bicarb 20, BUN 28, Cr 1.32, Ca 8.3, WBC 13.0 improving, Hbg 12.6  Procal improving 1.77 >> 1.13 Normal lactate 1.2  Family Communication: three family members present at bedside  Disposition: Status is: Inpatient Remains inpatient appropriate because: on IV antibiotics pending cultures  Planned Discharge Destination: Home    Time spent: 42 minutes  Author: Pennie Banter, DO 03/26/2023 4:21 PM  For on call review [email or web address].

## 2023-03-26 NOTE — Assessment & Plan Note (Signed)
Appears not on medication. Monitor for symptoms or retention.

## 2023-03-27 DIAGNOSIS — N3001 Acute cystitis with hematuria: Secondary | ICD-10-CM

## 2023-03-27 DIAGNOSIS — N400 Enlarged prostate without lower urinary tract symptoms: Secondary | ICD-10-CM | POA: Diagnosis not present

## 2023-03-27 LAB — BASIC METABOLIC PANEL
Anion gap: 9 (ref 5–15)
BUN: 21 mg/dL (ref 8–23)
CO2: 22 mmol/L (ref 22–32)
Calcium: 8.6 mg/dL — ABNORMAL LOW (ref 8.9–10.3)
Chloride: 108 mmol/L (ref 98–111)
Creatinine, Ser: 1.33 mg/dL — ABNORMAL HIGH (ref 0.61–1.24)
GFR, Estimated: 57 mL/min — ABNORMAL LOW (ref >60)
Glucose, Bld: 176 mg/dL — ABNORMAL HIGH (ref 70–99)
Potassium: 4 mmol/L (ref 3.5–5.1)
Sodium: 139 mmol/L (ref 135–145)

## 2023-03-27 LAB — CBC
HCT: 40.7 % (ref 39.0–52.0)
Hemoglobin: 13.2 g/dL (ref 13.0–17.0)
MCH: 28.6 pg (ref 26.0–34.0)
MCHC: 32.4 g/dL (ref 30.0–36.0)
MCV: 88.3 fL (ref 80.0–100.0)
Platelets: 169 10*3/uL (ref 150–400)
RBC: 4.61 MIL/uL (ref 4.22–5.81)
RDW: 14.6 % (ref 11.5–15.5)
WBC: 7.9 10*3/uL (ref 4.0–10.5)
nRBC: 0 % (ref 0.0–0.2)

## 2023-03-27 LAB — URINE CULTURE

## 2023-03-27 MED ORDER — PREDNISOLONE ACETATE 1 % OP SUSP
1.0000 [drp] | Freq: Three times a day (TID) | OPHTHALMIC | Status: DC
  Administered 2023-03-27 – 2023-03-28 (×4): 1 [drp] via OPHTHALMIC
  Filled 2023-03-27: qty 1

## 2023-03-27 MED ORDER — BENAZEPRIL HCL 20 MG PO TABS
40.0000 mg | ORAL_TABLET | Freq: Every day | ORAL | Status: DC
  Administered 2023-03-27 – 2023-03-28 (×2): 40 mg via ORAL
  Filled 2023-03-27 (×2): qty 2

## 2023-03-27 NOTE — TOC CM/SW Note (Signed)
Transition of Care [facility]) - Inpatient Brief Assessment   Patient Details  Name: Ricky Melton MRN: [id number] Date of Birth: [birth year]  Transition of Care [facility]) CM/SW Contact:    Allena Katz, LCSW Phone Number: 03/27/2023, 2:24 PM   Clinical Narrative:    Transition of Care Asessment: Insurance and Status: Insurance coverage has been reviewed Patient has primary care physician: Yes Home environment has been reviewed: [address] [place] [id number] Prior level of function:: independent, seen by PT/OT no needs. Prior/Current Home Services: No current home services Social Determinants of Health Reivew: SDOH reviewed no interventions necessary Readmission risk has been reviewed: Yes Transition of care needs: no transition of care needs at this time

## 2023-03-27 NOTE — Plan of Care (Signed)

## 2023-03-27 NOTE — Progress Notes (Addendum)
Physical Therapy Evaluation Patient Details Name: BRANTLY KALMAN MRN: [id number] DOB: 01/29/1951 Today's Date: 03/27/2023  History of Present Illness  DECARLO RIVET is a 72 y.o. male with past medical history of hypertension, hyperlipidemia, CAD, and CKD who presents to the ED complaining of increasing weakness, chills, subjective fever, loss of appetite and diarrhea.   Clinical Impression  Patient supine in bed upon arrival with spouse at bedside, agreeable to PT evaluation. No pain noted. Patient was ambulating without an AD and IND for mobility and ADLs. Was working part time in maintenance prior to admission. On this date, patient is currently Mod I with all aspects of bed mobility and supervision with ambulation without AD and supervision with stair negotiation. No physical assistance required on evaluation.  Patient demonstrating no impairments on evaluation, and seems to be at functional baseline. Patient has support from spouse and family upon discharge if needed. No equipment needs noted.     Assistance Recommended at Discharge PRN  If plan is discharge home, recommend the following:  Can travel by private vehicle  Assist for transportation;Assistance with cooking/housework        Equipment Recommendations None recommended by PT  Recommendations for Other Services       Functional Status Assessment Patient has not had a recent decline in their functional status     Precautions / Restrictions Precautions Precautions: None Restrictions Weight Bearing Restrictions: No      Mobility  Bed Mobility Overal bed mobility: Modified Independent             General bed mobility comments: Mod I with supine to sit and sit to supine, increased time required but able to complete without physical assistance    Transfers Overall transfer level: Modified independent Equipment used: None               General transfer comment: Patient able to stand from EOB and transfer  to chair with Mod I, no UE support or AD utilized. Denies lightheadedness.    Ambulation/Gait Ambulation/Gait assistance: Supervision Gait Distance (Feet): 200 Feet Assistive device: None Gait Pattern/deviations: WFL(Within Functional Limits)       General Gait Details: mild unsteadiness with initial start at ambulation trial improvements noted with increased distance, ambulated 200 ft with supervision for safety and no AD.  Stairs Stairs: Yes Stairs assistance: Supervision Stair Management: One rail Left, Alternating pattern, Forwards Number of Stairs: 6 General stair comments: able to ascend/descend with supervision, no imbalance noted with use of rail  Wheelchair Mobility     Tilt Bed    Modified Rankin (Stroke Patients Only)       Balance Overall balance assessment: Modified Independent                                           Pertinent Vitals/Pain Pain Assessment Pain Assessment: No/denies pain    Home Living Family/patient expects to be discharged to:: Private residence Living Arrangements: Spouse/significant other Available Help at Discharge: Family Type of Home: House Home Access: Stairs to enter Entrance Stairs-Rails: Right;Left;Can reach both Secretary/administrator of Steps: 6   Home Layout: One level Home Equipment: Cane - single point      Prior Function Prior Level of Function : Independent/Modified Independent             Mobility Comments: Reports had a brief period of time that required a  SPC due to pain in Bilat knees after tick bite approx May 2024. However after this improved was not utilizing any AD or equipment. Was IND with mobility. Working Part Time in Maintenance. ADLs Comments: IND with ADLs/IADLs     Hand Dominance   Dominant Hand: Right    Extremity/Trunk Assessment        Lower Extremity Assessment Lower Extremity Assessment: Overall WFL for tasks assessed       Communication   Communication:  No difficulties  Cognition Arousal/Alertness: Awake/alert Behavior During Therapy: WFL for tasks assessed/performed Overall Cognitive Status: Within Functional Limits for tasks assessed                                          General Comments      Exercises     Assessment/Plan    PT Assessment Patient does not need any further PT services  PT Problem List         PT Treatment Interventions      PT Goals (Current goals can be found in the Care Plan section)       Frequency       Co-evaluation               AM-PAC PT "6 Clicks" Mobility  Outcome Measure Help needed turning from your back to your side while in a flat bed without using bedrails?: None Help needed moving from lying on your back to sitting on the side of a flat bed without using bedrails?: None Help needed moving to and from a bed to a chair (including a wheelchair)?: None Help needed standing up from a chair using your arms (e.g., wheelchair or bedside chair)?: None Help needed to walk in hospital room?: None   6 Click Score: 20    End of Session Equipment Utilized During Treatment: Gait belt Activity Tolerance: Patient tolerated treatment well Patient left: in bed;with family/visitor present Nurse Communication: Mobility status PT Visit Diagnosis: Unsteadiness on feet (R26.81);Other abnormalities of gait and mobility (R26.89)    Time: 1320-1340 PT Time Calculation (min) (ACUTE ONLY): 20 min   Charges:   PT Evaluation $PT Eval Low Complexity: 1 Low   PT General Charges $$ ACUTE PT VISIT: 1 Visit         Creed Copper Fairly, PT, DPT 03/27/23 2:36 PM

## 2023-03-27 NOTE — Progress Notes (Signed)
Progress Note   Patient: Ricky Melton:[id number] DOB: 10-21-1950 DOA: 03/25/2023     2 DOS: the patient was seen and examined on 03/27/2023   Brief hospital course: HPI on admission 03/25/23 by Dr. Sedalia Muta: "Ricky Melton is a 72 year old male with history of morbid obesity, hypertension, CAD status post PCI x 2, hypothyroid, hyperlipidemia, who presents to the emergency department for chief concerns of weakness, chills, subjective fever, loss of appetite and diarrhea.  Vitals in the ED showed temperature of 99.1, respiration rate of 19, heart rate 72, blood pressure 116/62, SpO2 of 99% on room air."  Serum sodium 136, potassium 4.4, chloride 107, bicarb 19, serum creatinine 1.68, BUN of 29, nonfasting blood glucose 95, EGFR 44.    WBC elevated at 17.4,  Lactic acid 1.2.  Procalcitonin elevated at 1.77. UA was positive for large leukocytes and positive for nitrates.  Patient was treated with IV Rocephin in the ED, admitted to the hospital for empiric IV antibiotics for UTI pending culture results.   Assessment and Plan: * UTI (urinary tract infection) Sepsis Ruled Out -- pt only had 1 SIRS criteria, leukocytosis. Normal lactic acid. 7/15 - pt improving, WBC and procal trending down. 7/16 - urine culture growing E. coli --Continue IV Rocephin --Follow culture susceptibilities --Monitor fever curve, CBC  Diarrhea RESOLVED. No diarrhea since admission, only small amounts of mucus passed. --DC C diff and GI panel --DC enteric precautions  Essential hypertension Continue home meds:  Coreg 25 mg BID Amlodipine 10 mg  + Hydralazine 5 mg IV PRN  BPH (benign prostatic hyperplasia) Appears not on medication. Monitor for symptoms or retention.  Morbid obesity (HCC) Body mass index is 35.71 kg/m. Complicates overall care and prognosis.  Recommend lifestyle modifications including physical activity and diet for weight loss and overall long-term  health.  Hypothyroidism Levothyroxine 50 mcg daily   Hyperlipidemia Rosuvastatin 40 mg daily   CAD (coronary artery disease) Continue ASA, Plavix, Coreg Crestor Stable, no chest pain.        Subjective: Pt sitting up in bed visiting with family when seen this AM.  Reports he feels better.  Had a BM with just two solid small pieces of stool, no diarrhea.  Denies other complaints.  Feels anxious at times.  He got a little dizzy when standing earlier, resolved quickly.   Physical Exam: Vitals:   03/26/23 1614 03/26/23 2030 03/27/23 0507 03/27/23 0827  BP: (!) 140/69 (!) 142/65 (!) 143/70 (!) 154/71  Pulse: 69 72 62 67  Resp: 18 18 18 18   Temp: 98.2 F (36.8 C) 98.5 F (36.9 C) 98.2 F (36.8 C) 98.2 F (36.8 C)  TempSrc:    Oral  SpO2: 98% 97% 97% 97%  Weight:      Height:       General exam: awake, alert, no acute distress HEENT: moist mucus membranes, hearing grossly normal  Respiratory system: CTAB, no wheezes, rales or rhonchi, normal respiratory effort. Cardiovascular system: normal S1/S2,  RRR, no pedal edema.   Gastrointestinal system: soft, NT, ND  Central nervous system: A&O x 4. no gross focal neurologic deficits, normal speech Extremities: moves all, no edema, normal tone Skin: dry, intact, normal temperature Psychiatry: normal mood, congruent affect, judgement and insight appear normal   Data Reviewed:  Notable labs --- Cr 1.32 >> 1.33, Ca 8.6, WBC normalized 13.0 >> 7.9  Prior: Procal improving 1.77 >> 1.13 Normal lactate 1.2    Family Communication: family members present at bedside  Disposition: Status is: Inpatient Remains inpatient appropriate because: on IV antibiotics pending cultures  Planned Discharge Destination: Home    Time spent: 36 minutes  Author: Pennie Banter, DO 03/27/2023 4:21 PM  For on call review [email or web address].

## 2023-03-27 NOTE — Plan of Care (Signed)
  Problem: Fluid Volume: Goal: Hemodynamic stability will improve Outcome: Progressing   Problem: Clinical Measurements: Goal: Diagnostic test results will improve Outcome: Progressing Goal: Signs and symptoms of infection will decrease Outcome: Progressing    Problem: Health Behavior/Discharge Planning: Goal: Ability to manage health-related needs will improve Outcome: Progressing   Problem: Clinical Measurements: Goal: Ability to maintain clinical measurements within normal limits will improve Outcome: Progressing Goal: Will remain free from infection Outcome: Progressing Goal: Diagnostic test results will improve Outcome: Progressing Goal: Respiratory complications will improve Outcome: Progressing Goal: Cardiovascular complication will be avoided Outcome: Progressing   Problem: Activity: Goal: Risk for activity intolerance will decrease Outcome: Progressing   Problem: Nutrition: Goal: Adequate nutrition will be maintained Outcome: Progressing   Problem: Coping: Goal: Level of anxiety will decrease Outcome: Progressing   Problem: Elimination: Goal: Will not experience complications related to bowel motility Outcome: Progressing Goal: Will not experience complications related to urinary retention Outcome: Progressing   Problem: Pain Managment: Goal: General experience of comfort will improve Outcome: Progressing  Problem: Health Behavior/Discharge Planning: Goal: Ability to manage health-related needs will improve Outcome: Progressing   Problem: Clinical Measurements: Goal: Ability to maintain clinical measurements within normal limits will improve Outcome: Progressing Goal: Will remain free from infection Outcome: Progressing Goal: Diagnostic test results will improve Outcome: Progressing Goal: Respiratory complications will improve Outcome: Progressing Goal: Cardiovascular complication will be avoided Outcome: Progressing

## 2023-03-28 LAB — URINE CULTURE: Culture: >=100000 — AB

## 2023-03-28 LAB — BASIC METABOLIC PANEL
Anion gap: 6 (ref 5–15)
BUN: 22 mg/dL (ref 8–23)
CO2: 22 mmol/L (ref 22–32)
Calcium: 8.5 mg/dL — ABNORMAL LOW (ref 8.9–10.3)
Chloride: 111 mmol/L (ref 98–111)
Creatinine, Ser: 1.27 mg/dL — ABNORMAL HIGH (ref 0.61–1.24)
GFR, Estimated: >60 mL/min (ref >60)
Glucose, Bld: 86 mg/dL (ref 70–99)
Potassium: 4 mmol/L (ref 3.5–5.1)
Sodium: 139 mmol/L (ref 135–145)

## 2023-03-28 MED ORDER — NITROFURANTOIN MONOHYD MACRO 100 MG PO CAPS
100.0000 mg | ORAL_CAPSULE | Freq: Two times a day (BID) | ORAL | Status: DC
  Administered 2023-03-28: 100 mg via ORAL
  Filled 2023-03-28: qty 1

## 2023-03-28 MED ORDER — NITROFURANTOIN MONOHYD MACRO 100 MG PO CAPS: 100.0000 mg | ORAL_CAPSULE | Freq: Two times a day (BID) | ORAL | 0 refills | Status: AC

## 2023-03-28 NOTE — Progress Notes (Signed)
Mobility Specialist - Progress Note  03/28/23 0918  Mobility  Activity Ambulated independently in room  Level of Assistance Independent  Assistive Device None  Distance Ambulated (ft) 40 ft  Activity Response Tolerated well  $Mobility charge 1 Mobility  Mobility Specialist Start Time (ACUTE ONLY) 0908  Mobility Specialist Stop Time (ACUTE ONLY) [id number]  Mobility Specialist Time Calculation (min) (ACUTE ONLY) 10 min   Pt in fowler position upon entry, utilizing RA. Pt agreeable to OOB amb this date. Pt completed bed mob, STS and amb indep, tolerated well. Pt amb to the door within the room without AD X3, no LOB. Pt left supine with needs within reach. Family member present at bedside.   Zetta Bills Mobility Specialist 03/28/23 9:21 AM

## 2023-03-28 NOTE — Discharge Summary (Addendum)
Physician Discharge Summary   Ricky Melton  male DOB: [birth year]  ONG:[id number]  PCP: Dorcas Carrow, DO  Admit date: 03/25/2023 Discharge date: 03/28/2023  Admitted From: home Disposition:  home CODE STATUS: Full code   Hospital Course:  For full details, please see H&P, progress notes, consult notes and ancillary notes.  Briefly,  [name] is a 72 year old male with history of morbid obesity, hypertension, CAD status post PCI x 2, hypothyroid, who presented to the emergency department for chief concerns of weakness, chills, subjective fever, loss of appetite and diarrhea.   * UTI (urinary tract infection) 2/2 E coli Sepsis Ruled Out -- pt only had 1 SIRS criteria, leukocytosis.  Normal lactic acid. --Pt received 3 days of ceftriaxone, and discharged on 2 days of macrobid.  Diarrhea No diarrhea since admission, only small amounts of mucus passed.   Essential hypertension --cont Coreg 25 mg BID Amlodipine 10 mg    BPH (benign prostatic hyperplasia) Appears not on medication.   Morbid obesity (HCC) Body mass index is 35.71 kg/m. Complicates overall care and prognosis.  Recommend lifestyle modifications including physical activity and diet for weight loss and overall long-term health.   Hypothyroidism Levothyroxine 50 mcg daily    Hyperlipidemia Rosuvastatin 40 mg daily    CAD (coronary artery disease) Stable, no chest pain. 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  Discharge Diagnoses:  Principal Problem:   UTI (urinary tract infection) Active Problems:   CAD (coronary artery disease)   Hyperlipidemia   Hypothyroidism   Morbid obesity (HCC)   BPH (benign prostatic hyperplasia)   Essential hypertension   Diarrhea   30 Day Unplanned Readmission Risk Score    Flowsheet Row ED to Hosp-Admission (Current) from 03/25/2023 in [facility] MEDICAL TELEMETRY  30 Day Unplanned Readmission Risk Score (%) 11.34 Filed at 03/28/2023 0801       This score is the patient's risk of an unplanned readmission within 30 days of being discharged (0 -100%). The score is based on dignosis, age, lab data, medications, orders, and past utilization.   Low:  0-14.9   Medium: 15-21.9   High: 22-29.9   Extreme: 30 and above         Discharge Instructions:  Allergies as of 03/28/2023       Reactions   Hydrochlorothiazide Other (See Comments)   dizziness        Medication List     STOP taking these medications    amoxicillin-clavulanate 875-125 MG tablet Commonly known as: AUGMENTIN   diclofenac Sodium 1 % Gel Commonly known as: Voltaren   LORazepam 0.5 MG tablet Commonly known as: ATIVAN       TAKE these medications    allopurinol 300 MG tablet Commonly known as: ZYLOPRIM Take 0.5 tablets (150 mg total) by mouth daily.   amLODipine 10 MG tablet Commonly known as: NORVASC Take 1 tablet (10 mg total) by mouth every evening.   aspirin 81 MG tablet Take 81 mg by mouth daily.   benazepril 40 MG tablet Commonly known as: LOTENSIN Take 1 tablet (40 mg total) by mouth daily.   carvedilol 25 MG tablet Commonly known as: COREG Take 1 tablet (25 mg total) by mouth 2 (two) times daily with a meal.   clopidogrel 75 MG tablet Commonly known as: PLAVIX TAKE 1 TABLET BY MOUTH DAILY  levothyroxine 50 MCG tablet Commonly known as: SYNTHROID TAKE 1 TABLET BY MOUTH DAILY   nitrofurantoin (macrocrystal-monohydrate) 100 MG capsule Commonly known as: MACROBID Take 1 capsule (100 mg total) by mouth every 12 (twelve) hours for 2 days.   prednisoLONE acetate 1 % ophthalmic suspension Commonly known as: PRED FORTE Place 1 drop into the right eye 3 (three) times daily.   rosuvastatin 40 MG tablet Commonly known as: CRESTOR Take 1 tablet (40 mg total) by mouth daily.         Follow-up  Information     Olevia Perches P, DO Follow up in 1 week(s).   Specialty: Family Medicine Contact information: [address] [place] [address] [phone]                 Allergies  Allergen Reactions   Hydrochlorothiazide Other (See Comments)    dizziness     The results of significant diagnostics from this hospitalization (including imaging, microbiology, ancillary and laboratory) are listed below for reference.   Consultations:   Procedures/Studies: DG Chest 2 View  Result Date: 03/25/2023 CLINICAL DATA:  Weakness EXAM: CHEST - 2 VIEW COMPARISON:  None Available. FINDINGS: The heart size and mediastinal contours are within normal limits. Minimal linear scarring or atelectasis at the left lung base. Lungs are otherwise clear. No pleural effusion or pneumothorax. The visualized skeletal structures are unremarkable. IMPRESSION: Minimal linear scarring or atelectasis at the left lung base. No acute cardiopulmonary findings. Electronically Signed   By: Duanne Guess D.O.   On: 03/25/2023 16:48   PCV ECHOCARDIOGRAM COMPLETE  Addendum Date: 03/22/2023   Reason for Visit  INDICATIONS:   Echocardiogram: An echocardiogram in (2-d) mode was performed and in Doppler mode with color flow velocity mapping was performed. ventricular septum thickness 1.1 cm, L ventricular posterior wall thickness (diastole) 1.1 cm, left atrium size 4.5 cm, right atrium 15.4 cm, aortic root diameter 3.0 cm, L ventricle diastolic dimension 4.3 cm, L ventricle systolic dimension 2.4, L ventricle ejection fraction 60-65 %, and LV fractional shortening 30 % L ventricular outflow tract internal diameter 2.0 cm, L ventricular outflow tract flow velocity 1.3 m/s, aortic valve cusps 1.6 cm , aortic valve flow velocity 2.9 (m/sec), mitral valve diastolic peak flow velocity E .80 m/sec, and mitral valve diastolic peak flow E/A ratio 0.8 Mitral valve has trace regurgitation Aortic valve has  mild regurgitation Pulmonic  valve has trace regurgitation Tricuspid valve has trace regurgitation ASSESSMENT Technically difficult study due to body habitus. Suboptimal study due to poor windows. Normal chamber sizes. Normal left ventricular systolic function. Mild left ventricular hypertrophy with GRADE 1 (relaxation abnormality) diastolic dysfunction. Normal right ventricular systolic function. Normal right ventricular diastolic function. Normal left ventricular wall motion. Normal right ventricular wall motion. Trace pulmonary regurgitation. Trace tricuspid regurgitation. Normal pulmonary artery pressure. Trace mitral regurgitation. Mild aortic regurgitation. No pericardial effusion. Mildly dilated Left atrium No LVH   Result Date: 03/22/2023 Images from the original result were not included. Reason for Visit  INDICATIONS:   Echocardiogram: An echocardiogram in (2-d) mode was performed and in Doppler mode with color flow velocity mapping was performed. ventricular septum thickness 1.1 cm, L ventricular posterior wall thickness (diastole) 1.1 cm, left atrium size 4.5 cm, right atrium 15.4 cm, aortic root diameter 3.0 cm, L ventricle diastolic dimension 4.3 cm, L ventricle systolic dimension 2.4, L ventricle ejection fraction 60-65 %, and LV fractional shortening 30 % L ventricular outflow tract internal diameter 2.0 cm, L ventricular outflow  tract flow velocity 1.3 m/s, aortic valve cusps 1.6 cm , aortic valve flow velocity 2.9 (m/sec), mitral valve diastolic peak flow velocity E .80 m/sec, and mitral valve diastolic peak flow E/A ratio 0.8 Mitral valve has trace regurgitation Aortic valve has  mild regurgitation Pulmonic valve has trace regurgitation Tricuspid valve has trace regurgitation ASSESSMENT Technically difficult study due to body habitus. Suboptimal study due to poor windows. Normal chamber sizes. Normal left ventricular systolic function. Mild left ventricular hypertrophy with GRADE 1 (relaxation abnormality) diastolic  dysfunction. Normal right ventricular systolic function. Normal right ventricular diastolic function. Normal left ventricular wall motion. Normal right ventricular wall motion. Trace pulmonary regurgitation. Trace tricuspid regurgitation. Normal pulmonary artery pressure. Trace mitral regurgitation. Mild aortic regurgitation. No pericardial effusion. Mildly dilated Left atrium No LVH      Labs: BNP (last 3 results) No results for input(s): "BNP" in the last 8760 hours. Basic Metabolic Panel: Recent Labs  Lab 03/25/23 1239 03/26/23 0619 03/27/23 0919 03/28/23 0618  NA 136 139 139 139  K 4.4 4.1 4.0 4.0  CL 107 112* 108 111  CO2 19* 20* 22 22  GLUCOSE 95 96 176* 86  BUN 28* 28* 21 22  CREATININE 1.66* 1.32* 1.33* 1.27*  CALCIUM 9.0 8.3* 8.6* 8.5*   Liver Function Tests: Recent Labs  Lab 03/25/23 1239  AST 19  ALT 24  ALKPHOS 52  BILITOT 1.0  PROT 6.9  ALBUMIN 3.5   No results for input(s): "LIPASE", "AMYLASE" in the last 168 hours. No results for input(s): "AMMONIA" in the last 168 hours. CBC: Recent Labs  Lab 03/25/23 1239 03/26/23 0619 03/27/23 0919  WBC 17.4* 13.0* 7.9  HGB 13.6 12.6* 13.2  HCT 42.4 38.5* 40.7  MCV 88.3 86.9 88.3  PLT 162 153 169   Cardiac Enzymes: No results for input(s): "CKTOTAL", "CKMB", "CKMBINDEX", "TROPONINI" in the last 168 hours. BNP: Invalid input(s): "POCBNP" CBG: No results for input(s): "GLUCAP" in the last 168 hours. D-Dimer No results for input(s): "DDIMER" in the last 72 hours. Hgb A1c No results for input(s): "HGBA1C" in the last 72 hours. Lipid Profile No results for input(s): "CHOL", "HDL", "LDLCALC", "TRIG", "CHOLHDL", "LDLDIRECT" in the last 72 hours. Thyroid function studies No results for input(s): "TSH", "T4TOTAL", "T3FREE", "THYROIDAB" in the last 72 hours.  Invalid input(s): "FREET3" Anemia work up No results for input(s): "VITAMINB12", "FOLATE", "FERRITIN", "TIBC", "IRON", "RETICCTPCT" in the last 72  hours. Urinalysis    Component Value Date/Time   COLORURINE YELLOW (A) 03/25/2023 1642   APPEARANCEUR CLOUDY (A) 03/25/2023 1642   APPEARANCEUR Clear 07/13/2022 0830   LABSPEC 1.013 03/25/2023 1642   PHURINE 5.0 03/25/2023 1642   GLUCOSEU NEGATIVE 03/25/2023 1642   HGBUR MODERATE (A) 03/25/2023 1642   BILIRUBINUR NEGATIVE 03/25/2023 1642   BILIRUBINUR Negative 07/13/2022 0830   KETONESUR NEGATIVE 03/25/2023 1642   PROTEINUR 100 (A) 03/25/2023 1642   NITRITE POSITIVE (A) 03/25/2023 1642   LEUKOCYTESUR LARGE (A) 03/25/2023 1642   Sepsis Labs Recent Labs  Lab 03/25/23 1239 03/26/23 0619 03/27/23 0919  WBC 17.4* 13.0* 7.9   Microbiology Recent Results (from the past 240 hour(s))  Culture, blood (routine x 2)     Status: None (Preliminary result)   Collection Time: 03/25/23  4:42 PM   Specimen: BLOOD  Result Value Ref Range Status   Specimen Description BLOOD BLOOD RIGHT ARM  Final   Special Requests   Final    BOTTLES DRAWN AEROBIC AND ANAEROBIC Blood Culture adequate volume  Culture   Final    NO GROWTH 3 DAYS Performed at [facility], [address] [place], [place] [address]    Report Status PENDING  Incomplete  Culture, blood (routine x 2)     Status: None (Preliminary result)   Collection Time: 03/25/23  4:42 PM   Specimen: BLOOD  Result Value Ref Range Status   Specimen Description BLOOD BLOOD LEFT ARM  Final   Special Requests   Final    BOTTLES DRAWN AEROBIC AND ANAEROBIC Blood Culture results may not be optimal due to an inadequate volume of blood received in culture bottles   Culture   Final    NO GROWTH 3 DAYS Performed at [facility], [address], [place], [place] [address]    Report Status PENDING  Incomplete  Resp panel by RT-PCR (RSV, Flu A&B, Covid) Urine, Clean Catch     Status: None   Collection Time: 03/25/23  4:42 PM   Specimen: Urine, Clean Catch; Nasal Swab  Result Value Ref Range Status   SARS Coronavirus 2 by RT  PCR NEGATIVE NEGATIVE Final    Comment: (NOTE) SARS-CoV-2 target nucleic acids are NOT DETECTED.  The SARS-CoV-2 RNA is generally detectable in upper respiratory specimens during the acute phase of infection. The lowest concentration of SARS-CoV-2 viral copies this assay can detect is 138 copies/mL. A negative result does not preclude SARS-Cov-2 infection and should not be used as the sole basis for treatment or other patient management decisions. A negative result may occur with  improper specimen collection/handling, submission of specimen other than nasopharyngeal swab, presence of viral mutation(s) within the areas targeted by this assay, and inadequate number of viral copies(<138 copies/mL). A negative result must be combined with clinical observations, patient history, and epidemiological information. The expected result is Negative.  Fact Sheet for Patients:  BloggerCourse.com  Fact Sheet for Healthcare Providers:  SeriousBroker.it  This test is no t yet approved or cleared by the [place] FDA and  has been authorized for detection and/or diagnosis of SARS-CoV-2 by FDA under an Emergency Use Authorization (EUA). This EUA will remain  in effect (meaning this test can be used) for the duration of the COVID-19 declaration under Section 564(b)(1) of the Act, 21 U.S.C.section 360bbb-3(b)(1), unless the authorization is terminated  or revoked sooner.       Influenza A by PCR NEGATIVE NEGATIVE Final   Influenza B by PCR NEGATIVE NEGATIVE Final    Comment: (NOTE) The Xpert Xpress SARS-CoV-2/FLU/RSV plus assay is intended as an aid in the diagnosis of influenza from Nasopharyngeal swab specimens and should not be used as a sole basis for treatment. Nasal washings and aspirates are unacceptable for Xpert Xpress SARS-CoV-2/FLU/RSV testing.  Fact Sheet for Patients: BloggerCourse.com  Fact Sheet for  Healthcare Providers: SeriousBroker.it  This test is not yet approved or cleared by the [place] FDA and has been authorized for detection and/or diagnosis of SARS-CoV-2 by FDA under an Emergency Use Authorization (EUA). This EUA will remain in effect (meaning this test can be used) for the duration of the COVID-19 declaration under Section 564(b)(1) of the Act, 21 U.S.C. section 360bbb-3(b)(1), unless the authorization is terminated or revoked.     Resp Syncytial Virus by PCR NEGATIVE NEGATIVE Final    Comment: (NOTE) Fact Sheet for Patients: BloggerCourse.com  Fact Sheet for Healthcare Providers: SeriousBroker.it  This test is not yet approved or cleared by the [place] FDA and has been authorized for detection and/or  diagnosis of SARS-CoV-2 by FDA under an Emergency Use Authorization (EUA). This EUA will remain in effect (meaning this test can be used) for the duration of the COVID-19 declaration under Section 564(b)(1) of the Act, 21 U.S.C. section 360bbb-3(b)(1), unless the authorization is terminated or revoked.  Performed at [facility], [address] [place], [place] [address]   Urine Culture     Status: Abnormal   Collection Time: 03/25/23  4:42 PM   Specimen: Urine, Clean Catch  Result Value Ref Range Status   Specimen Description   Final    URINE, CLEAN CATCH Performed at [facility], [address], [place], [place] [address]    Special Requests   Final    NONE Performed at [facility], [address] [place], [place] [address]    Culture >=100,000 COLONIES/mL ESCHERICHIA COLI (A)  Final   Report Status 03/28/2023 FINAL  Final   Organism ID, Bacteria ESCHERICHIA COLI (A)  Final      Susceptibility   Escherichia coli - MIC*    AMPICILLIN 4 SENSITIVE Sensitive     CEFAZOLIN <=4 SENSITIVE Sensitive     CEFEPIME <=0.12 SENSITIVE Sensitive      CEFTRIAXONE <=0.25 SENSITIVE Sensitive     CIPROFLOXACIN <=0.25 SENSITIVE Sensitive     GENTAMICIN <=1 SENSITIVE Sensitive     IMIPENEM <=0.25 SENSITIVE Sensitive     NITROFURANTOIN <=16 SENSITIVE Sensitive     TRIMETH/SULFA <=20 SENSITIVE Sensitive     AMPICILLIN/SULBACTAM <=2 SENSITIVE Sensitive     PIP/TAZO <=4 SENSITIVE Sensitive     * >=100,000 COLONIES/mL ESCHERICHIA COLI     Total time spend on discharging this patient, including the last patient exam, discussing the hospital stay, instructions for ongoing care as it relates to all pertinent caregivers, as well as preparing the medical discharge records, prescriptions, and/or referrals as applicable, is 35 minutes.    Darlin Priestly, MD  [facility] 03/28/2023, 9:02 AM

## 2023-03-28 NOTE — Plan of Care (Signed)

## 2023-03-29 ENCOUNTER — Ambulatory Visit: Payer: Medicare Other | Admitting: Family Medicine

## 2023-03-29 ENCOUNTER — Telehealth: Payer: Self-pay | Admitting: *Deleted

## 2023-03-29 NOTE — Transitions of Care (Post Inpatient/ED Visit) (Signed)
   03/29/2023  Name: Ricky Melton MRN: [id number] DOB: 1951/08/04  Today's TOC FU Call Status: Today's TOC FU Call Status:: Unsuccessul Call (1st Attempt) Unsuccessful Call (1st Attempt) Date: 03/29/23  Attempted to reach the patient regarding the most recent Inpatient/ED visit.  Follow Up Plan: Additional outreach attempts will be made to reach the patient to complete the Transitions of Care (Post Inpatient/ED visit) call.   Gean Maidens BSN RN [facility] [phone]

## 2023-03-30 ENCOUNTER — Telehealth: Payer: Self-pay | Admitting: *Deleted

## 2023-03-30 ENCOUNTER — Ambulatory Visit: Payer: Self-pay | Admitting: *Deleted

## 2023-03-30 LAB — CULTURE, BLOOD (ROUTINE X 2)
Culture: NO GROWTH
Culture: NO GROWTH
Special Requests: ADEQUATE

## 2023-03-30 NOTE — Transitions of Care (Post Inpatient/ED Visit) (Signed)
03/30/2023  Name: Ricky Melton MRN: [id number] DOB: 1950-10-07  Today's TOC FU Call Status: Today's TOC FU Call Status:: Successful TOC FU Call Competed TOC FU Call Complete Date: 03/30/23  Transition Care Management Follow-up Telephone Call Date of Discharge: 03/28/23 Discharge Facility: [facility]) Type of Discharge: Inpatient Admission Primary Inpatient Discharge Diagnosis:: UTI How have you been since you were released from the hospital?: Better Any questions or concerns?: No  Items Reviewed: Did you receive and understand the discharge instructions provided?: Yes Medications obtained,verified, and reconciled?: Yes (Medications Reviewed) (RN reiterated to patient to take all ATBX) Any new allergies since your discharge?: No Dietary orders reviewed?: No Do you have support at home?: Yes People in Home: spouse Name of Support/Comfort Primary Source: Diane  Medications Reviewed Today: Medications Reviewed Today     Reviewed by Luella Cook, RN (Case Manager) on 03/30/23 at 1622  Med List Status: <None>   Medication Order Taking? Sig Documenting Provider Last Dose Status Informant  allopurinol (ZYLOPRIM) 300 MG tablet [id number] Yes Take 0.5 tablets (150 mg total) by mouth daily. Olevia Perches P, DO Taking Active Self  amLODipine (NORVASC) 10 MG tablet [id number] Yes Take 1 tablet (10 mg total) by mouth every evening. Dorcas Carrow, DO Taking Active Self  aspirin 81 MG tablet [id number] Yes Take 81 mg by mouth daily. [provider] Taking Active Self  benazepril (LOTENSIN) 40 MG tablet [id number] Yes Take 1 tablet (40 mg total) by mouth daily. Dorcas Carrow, DO Taking Active Self  carvedilol (COREG) 25 MG tablet [id number] Yes Take 1 tablet (25 mg total) by mouth 2 (two) times daily with a meal. Laurier Nancy, MD Taking Active Self  clopidogrel (PLAVIX) 75 MG tablet [id number] Yes TAKE 1 TABLET BY MOUTH DAILY Laural Benes, Megan P, DO  Taking Active Self  levothyroxine (SYNTHROID) 50 MCG tablet [id number] Yes TAKE 1 TABLET BY MOUTH DAILY Laural Benes, Megan P, DO Taking Active Self  nitrofurantoin, macrocrystal-monohydrate, (MACROBID) 100 MG capsule [id number] Yes Take 1 capsule (100 mg total) by mouth every 12 (twelve) hours for 2 days. Darlin Priestly, MD Taking Active   prednisoLONE acetate (PRED FORTE) 1 % ophthalmic suspension [id number] Yes Place 1 drop into the right eye 3 (three) times daily. [provider] Taking Active Self  rosuvastatin (CRESTOR) 40 MG tablet [id number] Yes Take 1 tablet (40 mg total) by mouth daily. Dorcas Carrow, DO Taking Active Self            Home Care and Equipment/Supplies: Were Home Health Services Ordered?: NA Any new equipment or medical supplies ordered?: NA  Functional Questionnaire: Do you need assistance with bathing/showering or dressing?: No Do you need assistance with meal preparation?: No Do you need assistance with eating?: No Do you have difficulty maintaining continence: No Do you need assistance with getting out of bed/getting out of a chair/moving?: No Do you have difficulty managing or taking your medications?: No  Follow up appointments reviewed: PCP Follow-up appointment confirmed?: Yes Date of PCP follow-up appointment?: 04/06/23 Follow-up Provider: Olevia Perches Specialist [facility] Follow-up appointment confirmed?: NA Do you need transportation to your follow-up appointment?: No Do you understand care options if your condition(s) worsen?: Yes-patient verbalized understanding  SDOH Interventions Today    Flowsheet Row Most Recent Value  SDOH Interventions   Food Insecurity Interventions Intervention Not Indicated  Housing Interventions Intervention Not Indicated  Transportation Interventions Intervention Not Indicated, Patient Resources (Friends/Family)      Interventions Today  Flowsheet Row Most Recent Value  General Interventions   General  Interventions Discussed/Reviewed General Interventions Discussed, General Interventions Reviewed, Doctor Visits  Doctor Visits Discussed/Reviewed Doctor Visits Discussed, Doctor Visits Reviewed  Pharmacy Interventions   Pharmacy Dicussed/Reviewed Pharmacy Topics Discussed      TOC Interventions Today    Flowsheet Row Most Recent Value  TOC Interventions   TOC Interventions Discussed/Reviewed TOC Interventions Discussed, TOC Interventions Reviewed        Gean Maidens BSN RN [facility] [phone]

## 2023-03-30 NOTE — Chronic Care Management (AMB) (Signed)
   03/30/2023  Ricky Melton Feb 19, 1951 [id number]   Patient not currently not participating in chronic care management services (CCM), status changed to previously enrolled.  Irving Shows [facility], BSN RN Case Manager [phone]

## 2023-04-06 ENCOUNTER — Ambulatory Visit (INDEPENDENT_AMBULATORY_CARE_PROVIDER_SITE_OTHER): Payer: Medicare Other | Admitting: Family Medicine

## 2023-04-06 ENCOUNTER — Encounter: Payer: Self-pay | Admitting: Family Medicine

## 2023-04-06 VITALS — BP 147/73 | HR 61 | Temp 98.3°F | Wt 226.8 lb

## 2023-04-06 DIAGNOSIS — Z87440 Personal history of urinary (tract) infections: Secondary | ICD-10-CM

## 2023-04-06 DIAGNOSIS — N182 Chronic kidney disease, stage 2 (mild): Secondary | ICD-10-CM

## 2023-04-06 DIAGNOSIS — E782 Mixed hyperlipidemia: Secondary | ICD-10-CM | POA: Diagnosis not present

## 2023-04-06 DIAGNOSIS — I10 Essential (primary) hypertension: Secondary | ICD-10-CM

## 2023-04-06 DIAGNOSIS — F4322 Adjustment disorder with anxiety: Secondary | ICD-10-CM

## 2023-04-06 DIAGNOSIS — R7303 Prediabetes: Secondary | ICD-10-CM

## 2023-04-06 DIAGNOSIS — M1000 Idiopathic gout, unspecified site: Secondary | ICD-10-CM | POA: Diagnosis not present

## 2023-04-06 DIAGNOSIS — E039 Hypothyroidism, unspecified: Secondary | ICD-10-CM | POA: Diagnosis not present

## 2023-04-06 LAB — URINALYSIS, ROUTINE W REFLEX MICROSCOPIC
Bilirubin, UA: NEGATIVE
Glucose, UA: NEGATIVE
Ketones, UA: NEGATIVE
Leukocytes,UA: NEGATIVE
Nitrite, UA: NEGATIVE
Specific Gravity, UA: 1.01 (ref 1.005–1.030)
Urobilinogen, Ur: 0.2 mg/dL (ref 0.2–1.0)
pH, UA: 5.5 (ref 5.0–7.5)

## 2023-04-06 LAB — BAYER DCA HB A1C WAIVED: HB A1C (BAYER DCA - WAIVED): 6 % — ABNORMAL HIGH (ref 4.8–5.6)

## 2023-04-06 LAB — MICROSCOPIC EXAMINATION
Bacteria, UA: NONE SEEN
Epithelial Cells (non renal): NONE SEEN /hpf (ref 0–10)

## 2023-04-06 NOTE — Assessment & Plan Note (Signed)
Rechecking labs today. Await results. Treat as needed.  °

## 2023-04-06 NOTE — Assessment & Plan Note (Signed)
Under good control on current regimen. Continue current regimen. Continue to monitor. Call with any concerns. Refills given. Labs drawn today.   

## 2023-04-06 NOTE — Assessment & Plan Note (Signed)
Encouraged diet and exercise with goal of losing 1-2lbs per week. Call with any concerns.  

## 2023-04-06 NOTE — Progress Notes (Signed)
BP (!) 147/73   Pulse 61   Temp 98.3 F (36.8 C) (Oral)   Wt 226 lb 12.8 oz (102.9 kg)   SpO2 98%   BMI 35.52 kg/m    Subjective:    Patient ID: Ricky Melton, male    DOB: 11-Jan-1951, 72 y.o.   MRN: [id number]  HPI: Ricky Melton is a 72 y.o. male  Chief Complaint  Patient presents with   Hypotension   Hypertension   Hyperlipidemia   OTHER    Pt was just in the hospital a week ago from Sunday- Wednesday for a UTI states he is still a little shaky and short of breath.     Transition of Care Hospital Follow up.   Hospital/Facility: [facility] D/C Physician: Dr. Fran Lowes D/C Date: 03/28/23  Records Requested: 04/06/23 Records Received: 04/06/23 Records Reviewed: 04/06/23  Diagnoses on Discharge:  UTI (urinary tract infection)   CAD (coronary artery disease)   Hyperlipidemia   Hypothyroidism   Morbid obesity (HCC)   BPH (benign prostatic hyperplasia)   Essential hypertension   Diarrhea  Date of interactive Contact within 48 hours of discharge: 03/29/23 and 03/30/23 Contact was through: phone  Date of 7 day or 14 day face-to-face visit:  04/06/23  within 14 days  Outpatient Encounter Medications as of 04/06/2023  Medication Sig   allopurinol (ZYLOPRIM) 300 MG tablet Take 0.5 tablets (150 mg total) by mouth daily.   amLODipine (NORVASC) 10 MG tablet Take 1 tablet (10 mg total) by mouth every evening.   aspirin 81 MG tablet Take 81 mg by mouth daily.   benazepril (LOTENSIN) 40 MG tablet Take 1 tablet (40 mg total) by mouth daily.   carvedilol (COREG) 25 MG tablet Take 1 tablet (25 mg total) by mouth 2 (two) times daily with a meal.   clopidogrel (PLAVIX) 75 MG tablet TAKE 1 TABLET BY MOUTH DAILY   levothyroxine (SYNTHROID) 50 MCG tablet TAKE 1 TABLET BY MOUTH DAILY   prednisoLONE acetate (PRED FORTE) 1 % ophthalmic suspension Place 1 drop into the right eye 3 (three) times daily.   rosuvastatin (CRESTOR) 40 MG tablet Take 1 tablet (40 mg total) by mouth daily.   No  facility-administered encounter medications on file as of 04/06/2023.  Per Hospitalist: "Hospital Course:  For full details, please see H&P, progress notes, consult notes and ancillary notes.  Briefly,  Ricky Melton is a 72 year old male with history of morbid obesity, hypertension, CAD status post PCI x 2, hypothyroid, who presented to the emergency department for chief concerns of weakness, chills, subjective fever, loss of appetite and diarrhea.   * UTI (urinary tract infection) 2/2 E coli Sepsis Ruled Out -- pt only had 1 SIRS criteria, leukocytosis.  Normal lactic acid. --Pt received 3 days of ceftriaxone, and discharged on 2 days of macrobid.   Diarrhea No diarrhea since admission, only small amounts of mucus passed.   Essential hypertension --cont Coreg 25 mg BID Amlodipine 10 mg    BPH (benign prostatic hyperplasia) Appears not on medication.   Morbid obesity (HCC) Body mass index is 35.71 kg/m. Complicates overall care and prognosis.  Recommend lifestyle modifications including physical activity and diet for weight loss and overall long-term health.   Hypothyroidism Levothyroxine 50 mcg daily    Hyperlipidemia Rosuvastatin 40 mg daily    CAD (coronary artery disease) Stable, no chest pain. 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   FINDINGS: The heart size and mediastinal contours are within normal limits. Minimal linear scarring or atelectasis at the left lung base. Lungs are otherwise clear. No pleural effusion or pneumothorax. The visualized skeletal structures are unremarkable.   IMPRESSION: Minimal linear scarring or atelectasis at the left lung base. No acute cardiopulmonary findings.  Disposition: Home  Consults: None  Discharge Instructions: Follow up here  Disease/illness Education: Discussed today  Home  Health/Community Services Discussions/Referrals: N/A  Establishment or re-establishment of referral orders for community resources: N/A  Discussion with other health care providers: N/A  Assessment and Support of treatment regimen adherence: Excellent  Appointments Coordinated with: Patient  Education for self-management, independent living, and ADLs: Discussed today.  Since getting out of the hospital, he has been feeling a little shakey and SOB. He notes that it seems to be when he's walking around. He feels more anxious. He notes that he is feeling better every day, but its taking time.  He is not having any problems with his urine. He notes that he has been feeling better every day.  Impaired Fasting Glucose HbA1C:  Lab Results  Component Value Date   HGBA1C 6.0 (H) 02/19/2023   Duration of elevated blood sugar: chronic Polydipsia: no Polyuria: no Weight change: no Visual disturbance: no Glucose Monitoring: no    Accucheck frequency: Not Checking Diabetic Education: Not Completed Family history of diabetes: yes  No gout flares. Tolerating his allopurinol well. No concerns.   HYPERTENSION / HYPERLIPIDEMIA Satisfied with current treatment? yes Duration of hypertension: chronic BP monitoring frequency: not checking BP medication side effects: no Past BP meds: carvedilol, benazepril, amlodipine Duration of hyperlipidemia: chronic Cholesterol medication side effects: no Cholesterol supplements: none Past cholesterol medications: crestor Medication compliance: excellent compliance Aspirin: yes Recent stressors: no Recurrent headaches: no Visual changes: no Palpitations: no Dyspnea: no Chest pain: no Lower extremity edema: no Dizzy/lightheaded: no  HYPOTHYROIDISM Thyroid control status:controlled Satisfied with current treatment? yes Medication side effects: no Medication compliance: excellent compliance Recent dose adjustment:no Fatigue: no Cold intolerance:  no Heat intolerance: no Weight gain: no Weight loss: no Constipation: no Diarrhea/loose stools: no Palpitations: no Lower extremity edema: no Anxiety/depressed mood: yes   Relevant past medical, surgical, family and social history reviewed and updated as indicated. Interim medical history since our last visit reviewed. Allergies and medications reviewed and updated.  Review of Systems  Constitutional: Negative.   Respiratory: Negative.    Cardiovascular: Negative.   Gastrointestinal: Negative.   Musculoskeletal: Negative.   Neurological:  Positive for tremors. Negative for dizziness, seizures, syncope, facial asymmetry, speech difficulty, weakness, light-headedness, numbness and headaches.  Psychiatric/Behavioral:  Negative for agitation, behavioral problems, confusion, decreased concentration, dysphoric mood, hallucinations, self-injury, sleep disturbance and suicidal ideas. The patient is nervous/anxious. The patient is not hyperactive.     Per HPI unless specifically indicated above     Objective:    BP (!) 147/73   Pulse 61   Temp 98.3 F (36.8 C) (Oral)   Wt 226 lb 12.8 oz (102.9 kg)   SpO2 98%   BMI 35.52 kg/m   Wt Readings from Last 3 Encounters:  04/06/23 226 lb 12.8 oz (102.9 kg)  03/25/23 228 lb (103.4 kg)  03/06/23 229 lb (103.9 kg)    Physical Exam Vitals and nursing note reviewed.  Constitutional:      General: He is not in acute distress.    Appearance: Normal  appearance. He is obese. He is not ill-appearing, toxic-appearing or diaphoretic.  HENT:     Head: Normocephalic and atraumatic.     Right Ear: External ear normal.     Left Ear: External ear normal.     Nose: Nose normal.     Mouth/Throat:     Mouth: Mucous membranes are moist.     Pharynx: Oropharynx is clear.  Eyes:     General: No scleral icterus.       Right eye: No discharge.        Left eye: No discharge.     Extraocular Movements: Extraocular movements intact.      Conjunctiva/sclera: Conjunctivae normal.     Pupils: Pupils are equal, round, and reactive to light.  Cardiovascular:     Rate and Rhythm: Normal rate and regular rhythm.     Pulses: Normal pulses.     Heart sounds: Normal heart sounds. No murmur heard.    No friction rub. No gallop.  Pulmonary:     Effort: Pulmonary effort is normal. No respiratory distress.     Breath sounds: Normal breath sounds. No stridor. No wheezing, rhonchi or rales.  Chest:     Chest wall: No tenderness.  Musculoskeletal:        General: Normal range of motion.     Cervical back: Normal range of motion and neck supple.  Skin:    General: Skin is warm and dry.     Capillary Refill: Capillary refill takes less than 2 seconds.     Coloration: Skin is not jaundiced or pale.     Findings: No bruising, erythema, lesion or rash.  Neurological:     General: No focal deficit present.     Mental Status: He is alert and oriented to person, place, and time. Mental status is at baseline.  Psychiatric:        Mood and Affect: Mood normal.        Behavior: Behavior normal.        Thought Content: Thought content normal.        Judgment: Judgment normal.     Results for orders placed or performed during the hospital encounter of 03/25/23  Culture, blood (routine x 2)   Specimen: BLOOD  Result Value Ref Range   Specimen Description BLOOD BLOOD RIGHT ARM    Special Requests      BOTTLES DRAWN AEROBIC AND ANAEROBIC Blood Culture adequate volume   Culture      NO GROWTH 5 DAYS Performed at [facility], [address] [place], [place] [address]    Report Status 03/30/2023 FINAL   Culture, blood (routine x 2)   Specimen: BLOOD  Result Value Ref Range   Specimen Description BLOOD BLOOD LEFT ARM    Special Requests      BOTTLES DRAWN AEROBIC AND ANAEROBIC Blood Culture results may not be optimal due to an inadequate volume of blood received in culture bottles   Culture      NO GROWTH 5 DAYS Performed  at [facility], [address] [place], [place] [address]    Report Status 03/30/2023 FINAL   Resp panel by RT-PCR (RSV, Flu A&B, Covid) Urine, Clean Catch   Specimen: Urine, Clean Catch; Nasal Swab  Result Value Ref Range   SARS Coronavirus 2 by RT PCR NEGATIVE NEGATIVE   Influenza A by PCR NEGATIVE NEGATIVE   Influenza B by PCR NEGATIVE NEGATIVE   Resp Syncytial Virus by PCR NEGATIVE NEGATIVE  Urine Culture   Specimen: Urine, Clean Catch  Result Value Ref Range   Specimen Description      URINE, CLEAN CATCH Performed at [facility], [address] [place], [place] [address]    Special Requests      NONE Performed at [facility], [address] [place], [place] [address]    Culture >=100,000 COLONIES/mL ESCHERICHIA COLI (A)    Report Status 03/28/2023 FINAL    Organism ID, Bacteria ESCHERICHIA COLI (A)       Susceptibility   Escherichia coli - MIC*    AMPICILLIN 4 SENSITIVE Sensitive     CEFAZOLIN <=4 SENSITIVE Sensitive     CEFEPIME <=0.12 SENSITIVE Sensitive     CEFTRIAXONE <=0.25 SENSITIVE Sensitive     CIPROFLOXACIN <=0.25 SENSITIVE Sensitive     GENTAMICIN <=1 SENSITIVE Sensitive     IMIPENEM <=0.25 SENSITIVE Sensitive     NITROFURANTOIN <=16 SENSITIVE Sensitive     TRIMETH/SULFA <=20 SENSITIVE Sensitive     AMPICILLIN/SULBACTAM <=2 SENSITIVE Sensitive     PIP/TAZO <=4 SENSITIVE Sensitive     * >=100,000 COLONIES/mL ESCHERICHIA COLI  Basic metabolic panel  Result Value Ref Range   Sodium 136 135 - 145 mmol/L   Potassium 4.4 3.5 - 5.1 mmol/L   Chloride 107 98 - 111 mmol/L   CO2 19 (L) 22 - 32 mmol/L   Glucose, Bld 95 70 - 99 mg/dL   BUN 28 (H) 8 - 23 mg/dL   Creatinine, Ser 0.98 (H) 0.61 - 1.24 mg/dL   Calcium 9.0 8.9 - 11.9 mg/dL   GFR, Estimated 44 (L) >60 mL/min   Anion gap 10 5 - 15  CBC  Result Value Ref Range   WBC 17.4 (H) 4.0 - 10.5 K/uL   RBC 4.80 4.22 - 5.81 MIL/uL   Hemoglobin 13.6 13.0 - 17.0 g/dL   HCT 14.7  82.9 - 56.2 %   MCV 88.3 80.0 - 100.0 fL   MCH 28.3 26.0 - 34.0 pg   MCHC 32.1 30.0 - 36.0 g/dL   RDW 13.0 86.5 - 78.4 %   Platelets 162 150 - 400 K/uL   nRBC 0.0 0.0 - 0.2 %  Urinalysis, Routine w reflex microscopic -Urine, Clean Catch  Result Value Ref Range   Color, Urine YELLOW (A) YELLOW   APPearance CLOUDY (A) CLEAR   Specific Gravity, Urine 1.013 1.005 - 1.030   pH 5.0 5.0 - 8.0   Glucose, UA NEGATIVE NEGATIVE mg/dL   Hgb urine dipstick MODERATE (A) NEGATIVE   Bilirubin Urine NEGATIVE NEGATIVE   Ketones, ur NEGATIVE NEGATIVE mg/dL   Protein, ur 696 (A) NEGATIVE mg/dL   Nitrite POSITIVE (A) NEGATIVE   Leukocytes,Ua LARGE (A) NEGATIVE   RBC / HPF 0-5 0 - 5 RBC/hpf   WBC, UA >50 0 - 5 WBC/hpf   Bacteria, UA RARE (A) NONE SEEN   Squamous Epithelial / HPF 0-5 0 - 5 /HPF   Mucus PRESENT   Lactic acid, plasma  Result Value Ref Range   Lactic Acid, Venous 1.2 0.5 - 1.9 mmol/L  Procalcitonin  Result Value Ref Range   Procalcitonin 1.77 ng/mL  Hepatic function panel  Result Value Ref Range   Total Protein 6.9 6.5 - 8.1 g/dL   Albumin 3.5 3.5 - 5.0 g/dL   AST 19 15 - 41 U/L   ALT 24 0 - 44 U/L   Alkaline Phosphatase 52 38 - 126 U/L   Total Bilirubin 1.0 0.3 -  1.2 mg/dL   Bilirubin, Direct 0.2 0.0 - 0.2 mg/dL   Indirect Bilirubin 0.8 0.3 - 0.9 mg/dL  Protime-INR  Result Value Ref Range   Prothrombin Time 15.6 (H) 11.4 - 15.2 seconds   INR 1.2 0.8 - 1.2  Procalcitonin  Result Value Ref Range   Procalcitonin 1.13 ng/mL  Basic metabolic panel  Result Value Ref Range   Sodium 139 135 - 145 mmol/L   Potassium 4.1 3.5 - 5.1 mmol/L   Chloride 112 (H) 98 - 111 mmol/L   CO2 20 (L) 22 - 32 mmol/L   Glucose, Bld 96 70 - 99 mg/dL   BUN 28 (H) 8 - 23 mg/dL   Creatinine, Ser 3.24 (H) 0.61 - 1.24 mg/dL   Calcium 8.3 (L) 8.9 - 10.3 mg/dL   GFR, Estimated 57 (L) >60 mL/min   Anion gap 7 5 - 15  CBC  Result Value Ref Range   WBC 13.0 (H) 4.0 - 10.5 K/uL   RBC 4.43 4.22 - 5.81  MIL/uL   Hemoglobin 12.6 (L) 13.0 - 17.0 g/dL   HCT 40.1 (L) 02.7 - 25.3 %   MCV 86.9 80.0 - 100.0 fL   MCH 28.4 26.0 - 34.0 pg   MCHC 32.7 30.0 - 36.0 g/dL   RDW 66.4 40.3 - 47.4 %   Platelets 153 150 - 400 K/uL   nRBC 0.0 0.0 - 0.2 %  Basic metabolic panel  Result Value Ref Range   Sodium 139 135 - 145 mmol/L   Potassium 4.0 3.5 - 5.1 mmol/L   Chloride 108 98 - 111 mmol/L   CO2 22 22 - 32 mmol/L   Glucose, Bld 176 (H) 70 - 99 mg/dL   BUN 21 8 - 23 mg/dL   Creatinine, Ser 2.59 (H) 0.61 - 1.24 mg/dL   Calcium 8.6 (L) 8.9 - 10.3 mg/dL   GFR, Estimated 57 (L) >60 mL/min   Anion gap 9 5 - 15  CBC  Result Value Ref Range   WBC 7.9 4.0 - 10.5 K/uL   RBC 4.61 4.22 - 5.81 MIL/uL   Hemoglobin 13.2 13.0 - 17.0 g/dL   HCT 56.3 87.5 - 64.3 %   MCV 88.3 80.0 - 100.0 fL   MCH 28.6 26.0 - 34.0 pg   MCHC 32.4 30.0 - 36.0 g/dL   RDW 32.9 51.8 - 84.1 %   Platelets 169 150 - 400 K/uL   nRBC 0.0 0.0 - 0.2 %  Basic metabolic panel  Result Value Ref Range   Sodium 139 135 - 145 mmol/L   Potassium 4.0 3.5 - 5.1 mmol/L   Chloride 111 98 - 111 mmol/L   CO2 22 22 - 32 mmol/L   Glucose, Bld 86 70 - 99 mg/dL   BUN 22 8 - 23 mg/dL   Creatinine, Ser 6.60 (H) 0.61 - 1.24 mg/dL   Calcium 8.5 (L) 8.9 - 10.3 mg/dL   GFR, Estimated >63 >01 mL/min   Anion gap 6 5 - 15      Assessment & Plan:   Problem List Items Addressed This Visit       Cardiovascular and Mediastinum   Essential hypertension - Primary    Under good control on current regimen. Continue current regimen. Continue to monitor. Call with any concerns. Refills given. Labs drawn today.        Relevant Orders   CBC with Differential/Platelet   Comprehensive metabolic panel     Endocrine   Hypothyroidism  Rechecking labs today. Await results. Treat as needed.       Relevant Orders   CBC with Differential/Platelet   Comprehensive metabolic panel   TSH     Genitourinary   CKD (chronic kidney disease), stage II     Rechecking labs today. Await results. Treat as needed.       Relevant Orders   CBC with Differential/Platelet   Comprehensive metabolic panel     Other   Hyperlipidemia    Under good control on current regimen. Continue current regimen. Continue to monitor. Call with any concerns. Refills given. Labs drawn today.       Relevant Orders   CBC with Differential/Platelet   Comprehensive metabolic panel   Lipid Panel w/o Chol/HDL Ratio   Gout    Under good control on current regimen. Continue current regimen. Continue to monitor. Call with any concerns. Refills given. Labs drawn today.       Relevant Orders   CBC with Differential/Platelet   Comprehensive metabolic panel   Uric acid   Morbid obesity (HCC)    Encouraged diet and exercise with goal of losing 1-2lbs per week. Call with any concerns.       Relevant Orders   CBC with Differential/Platelet   Comprehensive metabolic panel   Prediabetes    Rechecking labs today. Await results. Treat as needed.       Relevant Orders   CBC with Differential/Platelet   Bayer DCA Hb A1c Waived   Comprehensive metabolic panel   Other Visit Diagnoses     History of UTI       Feeling better. Rechecking labs and UA today. Await results.   Relevant Orders   Urinalysis, Routine w reflex microscopic   Adjustment disorder with anxious mood       Since hospitalization. Will check labs and recheck in 1 month. Call if getting worse.        Follow up plan: Return in about 4 weeks (around 05/04/2023) for follow up mood.

## 2023-04-10 ENCOUNTER — Encounter: Payer: Self-pay | Admitting: Family Medicine

## 2023-04-30 ENCOUNTER — Ambulatory Visit: Payer: Self-pay | Admitting: *Deleted

## 2023-04-30 NOTE — Telephone Encounter (Signed)
  Chief Complaint: medication call from wrong pharmacy- [facility]  Disposition: [] ED /[] Urgent Care (no appt availability in office) / [] Appointment(In office/virtual)/ []  [place] [facility]/ [x] Home Care/ [] Refused Recommended Disposition /[] [place] [facility]/ []  Follow-up with PCP Additional Notes: Patient states he received call from [facility] that Rx was ready for pick up- he uses [facility] now- called to [facility] per patient request and Rx has been canceled- advised patient is using mail order for daily medications. Patient made aware.

## 2023-04-30 NOTE — Telephone Encounter (Signed)
Summary: Medication Management   Patient would like to confirm his Thyroid medication has not been changed. Patient received a call from [facility] stating his Thyroid medication was ready to be picked up. But states he does not use [facility] for this medication and it should have went to [facility] order.  Advised patient no current medications have been sent to [facility].  *Patient was unsure the name of his Thyroid medication but would like a follow up call prior to 1 pm to confirm that his PCP did not change medication and advise him if he should go to [facility] in pick it up.         Reason for Disposition  Caller has medicine question only, adult not sick, AND triager answers question  Answer Assessment - Initial Assessment Questions 1. NAME of MEDICINE: "What medicine(s) are you calling about?"    levothyroxine (SYNTHROID) 50 MCG tablet 2. QUESTION: "What is your question?" (e.g., double dose of medicine, side effect)     Patient states he got notification from [facility] that they had refill of his thyroid medication- he is using [facility] for continuous daily medications. Patient was concerned that PCP had changed his dose- or he was not notified of something.  3. PRESCRIBER: "Who prescribed the medicine?" Reason: if prescribed by specialist, call should be referred to that group.     PCP Patient advised that his Rx was sent to [facility]- so not sure why he is getting notification Patient asked me to call and check. Call to pharmacy- verified Rx was filled for thyroid medication- original Rx from January- informed patient is now using mail order- does not need- they will cancel Rx RF. Patient notified he does not need to pick up Rx- continue with his [facility] pharmacy.  Protocols used: Medication Question Call-A-AH

## 2023-05-10 ENCOUNTER — Ambulatory Visit: Payer: Medicare Other | Admitting: Family Medicine

## 2023-05-15 DIAGNOSIS — I10 Essential (primary) hypertension: Secondary | ICD-10-CM | POA: Diagnosis not present

## 2023-05-15 DIAGNOSIS — M1000 Idiopathic gout, unspecified site: Secondary | ICD-10-CM | POA: Diagnosis not present

## 2023-05-15 DIAGNOSIS — N281 Cyst of kidney, acquired: Secondary | ICD-10-CM | POA: Diagnosis not present

## 2023-05-15 DIAGNOSIS — R809 Proteinuria, unspecified: Secondary | ICD-10-CM | POA: Diagnosis not present

## 2023-05-15 DIAGNOSIS — N1832 Chronic kidney disease, stage 3b: Secondary | ICD-10-CM | POA: Diagnosis not present

## 2023-05-22 ENCOUNTER — Ambulatory Visit: Payer: Medicare Other | Admitting: Family Medicine

## 2023-05-22 DIAGNOSIS — N1831 Chronic kidney disease, stage 3a: Secondary | ICD-10-CM | POA: Diagnosis not present

## 2023-05-22 DIAGNOSIS — N281 Cyst of kidney, acquired: Secondary | ICD-10-CM | POA: Diagnosis not present

## 2023-05-22 DIAGNOSIS — I10 Essential (primary) hypertension: Secondary | ICD-10-CM | POA: Diagnosis not present

## 2023-05-22 DIAGNOSIS — M1000 Idiopathic gout, unspecified site: Secondary | ICD-10-CM | POA: Diagnosis not present

## 2023-05-22 DIAGNOSIS — R809 Proteinuria, unspecified: Secondary | ICD-10-CM | POA: Diagnosis not present

## 2023-05-25 ENCOUNTER — Encounter: Payer: Self-pay | Admitting: Pharmacist

## 2023-05-31 ENCOUNTER — Encounter: Payer: Self-pay | Admitting: Family Medicine

## 2023-05-31 ENCOUNTER — Telehealth: Payer: Self-pay

## 2023-05-31 ENCOUNTER — Ambulatory Visit (INDEPENDENT_AMBULATORY_CARE_PROVIDER_SITE_OTHER): Payer: Medicare Other | Admitting: Family Medicine

## 2023-05-31 VITALS — BP 136/66 | HR 61 | Ht 67.0 in | Wt 230.2 lb

## 2023-05-31 DIAGNOSIS — M1712 Unilateral primary osteoarthritis, left knee: Secondary | ICD-10-CM | POA: Diagnosis not present

## 2023-05-31 DIAGNOSIS — M25512 Pain in left shoulder: Secondary | ICD-10-CM | POA: Diagnosis not present

## 2023-05-31 DIAGNOSIS — Z23 Encounter for immunization: Secondary | ICD-10-CM

## 2023-05-31 DIAGNOSIS — I10 Essential (primary) hypertension: Secondary | ICD-10-CM

## 2023-05-31 DIAGNOSIS — F4322 Adjustment disorder with anxiety: Secondary | ICD-10-CM

## 2023-05-31 MED ORDER — BACLOFEN 10 MG PO TABS: 10.0000 mg | ORAL_TABLET | Freq: Every evening | ORAL | 0 refills | Status: DC | PRN

## 2023-05-31 NOTE — Progress Notes (Signed)
05/31/2023  Patient ID: Ricky Melton, male   DOB: April 27, 1951, 72 y.o.   MRN: [id number]  Clinic routed request from patient's PCP, Dr. Olevia Perches, stating patient is unable to refill carvedilol 25mg  due to no longer be available at [facility].  [facility] pharmacy, and they endorse patient's prescription was filled in February of this year but never picked up.  They have the medication on hand and also are not aware of any supply issues.  [facility] is processing a refill for carvedilol 25mg  BID for patient to pick up.  Notified PCP.  Lenna Gilford, PharmD, DPLA

## 2023-05-31 NOTE — Assessment & Plan Note (Signed)
Steroid injection given today as below.

## 2023-05-31 NOTE — Assessment & Plan Note (Signed)
Checked with pharmacy- they are able to fill his carvedilol 25mg . They will get it ready for him.

## 2023-05-31 NOTE — Progress Notes (Signed)
BP 136/66   Pulse 61   Ht 5\' 7"  (1.702 m)   Wt 230 lb 3.2 oz (104.4 kg)   SpO2 98%   BMI 36.05 kg/m    Subjective:    Patient ID: Ricky Melton, male    DOB: 19-Aug-1951, 72 y.o.   MRN: [id number]  HPI: Ricky Melton is a 72 y.o. male  Chief Complaint  Patient presents with   Knee Pain    Patient says both of his knee are bothering him and says the L knee is bothering him more. Patient says he has taken Tylenol Arthritis and says it not helping at all.    Shoulder Pain    Patient says he thinks he may have pulled a muscle in his upper back. Patient says he hurt it at work about a week ago.    Medication Problem    Patient says he has been having an issues with getting his prescription of Carvedilol 25 MG and says he has been doubling up on his 12.5 MG to equal current dose.    KNEE PAIN Duration: chronic but worse in the last couple of weeks Involved knee: left Mechanism of injury: unknown Location:diffuse Onset: gradual Severity: moderate  Quality:  aching and sore Frequency: constant Radiation: no Aggravating factors: walking, weight bearing  Alleviating factors: ibuprofen  Status: worse Treatments attempted: rest, ibuprofen  Relief with NSAIDs?:  moderate Weakness with weight bearing or walking: no Sensation of giving way: no Locking: no Popping: no Bruising: no Swelling: no Redness: no Paresthesias/decreased sensation: no Fevers: no  SHOULDER PAIN Duration:  few days Involved shoulder: left Mechanism of injury: unknown- was carrying something down a ladder Location: posterior Onset:sudden Severity: moderate  Quality:  tight and aching Frequency: constant Radiation: no Aggravating factors: movement  Alleviating factors: nothing  Status: worse Treatments attempted: NSAID, rest  Relief with NSAIDs?:  no Weakness: no Numbness: no Decreased grip strength: no Redness: no Swelling: no Bruising: no Fevers: no  Feeling much more like himself. No  issues with his mood. No concerns.   Relevant past medical, surgical, family and social history reviewed and updated as indicated. Interim medical history since our last visit reviewed. Allergies and medications reviewed and updated.  Review of Systems  Constitutional: Negative.   Respiratory: Negative.    Cardiovascular: Negative.   Gastrointestinal: Negative.   Musculoskeletal:  Positive for arthralgias and myalgias. Negative for back pain, gait problem, joint swelling, neck pain and neck stiffness.  Skin: Negative.   Neurological: Negative.   Psychiatric/Behavioral: Negative.      Per HPI unless specifically indicated above     Objective:    BP 136/66   Pulse 61   Ht 5\' 7"  (1.702 m)   Wt 230 lb 3.2 oz (104.4 kg)   SpO2 98%   BMI 36.05 kg/m   Wt Readings from Last 3 Encounters:  05/31/23 230 lb 3.2 oz (104.4 kg)  04/06/23 226 lb 12.8 oz (102.9 kg)  03/25/23 228 lb (103.4 kg)    Physical Exam Vitals and nursing note reviewed.  Constitutional:      General: He is not in acute distress.    Appearance: Normal appearance. He is not ill-appearing, toxic-appearing or diaphoretic.  HENT:     Head: Normocephalic and atraumatic.     Right Ear: External ear normal.     Left Ear: External ear normal.     Nose: Nose normal.     Mouth/Throat:     Mouth: Mucous  membranes are moist.     Pharynx: Oropharynx is clear.  Eyes:     General: No scleral icterus.       Right eye: No discharge.        Left eye: No discharge.     Extraocular Movements: Extraocular movements intact.     Conjunctiva/sclera: Conjunctivae normal.     Pupils: Pupils are equal, round, and reactive to light.  Cardiovascular:     Rate and Rhythm: Normal rate and regular rhythm.     Pulses: Normal pulses.     Heart sounds: Normal heart sounds. No murmur heard.    No friction rub. No gallop.  Pulmonary:     Effort: Pulmonary effort is normal. No respiratory distress.     Breath sounds: Normal breath sounds.  No stridor. No wheezing, rhonchi or rales.  Chest:     Chest wall: No tenderness.  Musculoskeletal:        General: Tenderness (L paraspinal about T4, L knee) present. Normal range of motion.     Cervical back: Normal range of motion and neck supple.  Skin:    General: Skin is warm and dry.     Capillary Refill: Capillary refill takes less than 2 seconds.     Coloration: Skin is not jaundiced or pale.     Findings: No bruising, erythema, lesion or rash.  Neurological:     General: No focal deficit present.     Mental Status: He is alert and oriented to person, place, and time. Mental status is at baseline.  Psychiatric:        Mood and Affect: Mood normal.        Behavior: Behavior normal.        Thought Content: Thought content normal.        Judgment: Judgment normal.     Results for orders placed or performed in visit on 04/06/23  Microscopic Examination   BLD  Result Value Ref Range   WBC, UA 0-5 0 - 5 /hpf   RBC, Urine 0-2 0 - 2 /hpf   Epithelial Cells (non renal) None seen 0 - 10 /hpf   Bacteria, UA None seen None seen/Few  CBC with Differential/Platelet  Result Value Ref Range   WBC 7.7 3.4 - 10.8 x10E3/uL   RBC 4.91 4.14 - 5.80 x10E6/uL   Hemoglobin 14.1 13.0 - 17.7 g/dL   Hematocrit 28.4 13.2 - 51.0 %   MCV 87 79 - 97 fL   MCH 28.7 26.6 - 33.0 pg   MCHC 33.1 31.5 - 35.7 g/dL   RDW 44.0 10.2 - 72.5 %   Platelets 328 150 - 450 x10E3/uL   Neutrophils 62 Not Estab. %   Lymphs 27 Not Estab. %   Monocytes 9 Not Estab. %   Eos 1 Not Estab. %   Basos 1 Not Estab. %   Neutrophils Absolute 4.8 1.4 - 7.0 x10E3/uL   Lymphocytes Absolute 2.1 0.7 - 3.1 x10E3/uL   Monocytes Absolute 0.7 0.1 - 0.9 x10E3/uL   EOS (ABSOLUTE) 0.1 0.0 - 0.4 x10E3/uL   Basophils Absolute 0.1 0.0 - 0.2 x10E3/uL   Immature Granulocytes 0 Not Estab. %   Immature Grans (Abs) 0.0 0.0 - 0.1 x10E3/uL  Bayer DCA Hb A1c Waived  Result Value Ref Range   HB A1C (BAYER DCA - WAIVED) 6.0 (H) 4.8 - 5.6 %   Comprehensive metabolic panel  Result Value Ref Range   Glucose 79 70 - 99 mg/dL   BUN  24 8 - 27 mg/dL   Creatinine, Ser 1.61 (H) 0.76 - 1.27 mg/dL   eGFR 56 (L) >09 UE/AVW/0.98   BUN/Creatinine Ratio 18 10 - 24   Sodium 141 134 - 144 mmol/L   Potassium 4.7 3.5 - 5.2 mmol/L   Chloride 109 (H) 96 - 106 mmol/L   CO2 19 (L) 20 - 29 mmol/L   Calcium 9.3 8.6 - 10.2 mg/dL   Total Protein 6.5 6.0 - 8.5 g/dL   Albumin 4.0 3.8 - 4.8 g/dL   Globulin, Total 2.5 1.5 - 4.5 g/dL   Bilirubin Total 0.4 0.0 - 1.2 mg/dL   Alkaline Phosphatase 66 44 - 121 IU/L   AST 22 0 - 40 IU/L   ALT 50 (H) 0 - 44 IU/L  Lipid Panel w/o Chol/HDL Ratio  Result Value Ref Range   Cholesterol, Total 122 100 - 199 mg/dL   Triglycerides 119 0 - 149 mg/dL   HDL 42 >14 mg/dL   VLDL Cholesterol Cal 19 5 - 40 mg/dL   LDL Chol Calc (NIH) 61 0 - 99 mg/dL  TSH  Result Value Ref Range   TSH 1.770 0.450 - 4.500 uIU/mL  Uric acid  Result Value Ref Range   Uric Acid 4.3 3.8 - 8.4 mg/dL  Urinalysis, Routine w reflex microscopic  Result Value Ref Range   Specific Gravity, UA 1.010 1.005 - 1.030   pH, UA 5.5 5.0 - 7.5   Color, UA Yellow Yellow   Appearance Ur Clear Clear   Leukocytes,UA Negative Negative   Protein,UA 1+ (A) Negative/Trace   Glucose, UA Negative Negative   Ketones, UA Negative Negative   RBC, UA 1+ (A) Negative   Bilirubin, UA Negative Negative   Urobilinogen, Ur 0.2 0.2 - 1.0 mg/dL   Nitrite, UA Negative Negative   Microscopic Examination See below:       Assessment & Plan:   Problem List Items Addressed This Visit       Cardiovascular and Mediastinum   Essential hypertension    Checked with pharmacy- they are able to fill his carvedilol 25mg . They will get it ready for him.        Musculoskeletal and Integument   Arthritis of left knee    Steroid injection given today as below.       Relevant Medications   baclofen (LIORESAL) 10 MG tablet   Other Visit Diagnoses     Flu vaccine  need    -  Primary   Flu shot given today.   Relevant Orders   Flu Vaccine Trivalent High Dose (Fluad) (Completed)   Adjustment disorder with anxious mood       Doing much better. Continue to monitor.   Periscapular pain of left shoulder       Will treat with baclofen at bedtime. Call if not getting better or getting worse.       Procedure: Left  Knee Intraarticular Steroid Injection        Diagnosis:   ICD-10-CM   1. Flu vaccine need  Z23 Flu Vaccine Trivalent High Dose (Fluad)   Flu shot given today.    2. Arthritis of left knee  M17.12     3. Adjustment disorder with anxious mood  F43.22    Doing much better. Continue to monitor.    4. Periscapular pain of left shoulder  M25.512    Will treat with baclofen at bedtime. Call if not getting better or getting worse.    5. Essential  hypertension  I10       Physician: MPJ Consent:  Risks, benefits, and alternative treatments discussed and all questions were answered.  Patient elected to proceed and verbal consent obtained.  Description: Area prepped and draped using  semi-sterile technique.  Using a anterior/lateral approach, a mixture of 4 cc of  1% lidocaine & 1 cc of Kenalog 40 was injected into knee joint.  A bandage was then placed over the injection site. Complications: none Post Procedure Instructions: Wound care instructions discussed and patient was instructed to keep area clean and dry.  Signs and symptoms of infection discussed, patient agrees to contact the office ASAP should they occur.  Follow Up: Return January- 6 month follow up.  Follow up plan: Return January- 6 month follow up.

## 2023-06-20 DIAGNOSIS — H2512 Age-related nuclear cataract, left eye: Secondary | ICD-10-CM | POA: Diagnosis not present

## 2023-06-20 DIAGNOSIS — Z961 Presence of intraocular lens: Secondary | ICD-10-CM | POA: Diagnosis not present

## 2023-06-21 ENCOUNTER — Encounter: Payer: Self-pay | Admitting: Cardiovascular Disease

## 2023-06-21 ENCOUNTER — Ambulatory Visit: Payer: Medicare Other | Admitting: Cardiovascular Disease

## 2023-06-21 VITALS — BP 140/80 | HR 56 | Ht 67.0 in | Wt 226.6 lb

## 2023-06-21 DIAGNOSIS — N182 Chronic kidney disease, stage 2 (mild): Secondary | ICD-10-CM

## 2023-06-21 DIAGNOSIS — I25738 Atherosclerosis of nonautologous biological coronary artery bypass graft(s) with other forms of angina pectoris: Secondary | ICD-10-CM

## 2023-06-21 DIAGNOSIS — E782 Mixed hyperlipidemia: Secondary | ICD-10-CM

## 2023-06-21 DIAGNOSIS — I10 Essential (primary) hypertension: Secondary | ICD-10-CM

## 2023-06-21 NOTE — Progress Notes (Signed)
Cardiology Office Note   Date:  06/21/2023   ID:  Ricky Melton, DOB May 03, 1951, MRN [id number]  PCP:  Dorcas Carrow, DO  Cardiologist:  Adrian Blackwater, MD      History of Present Illness: Ricky Melton is a 72 y.o. male who presents for  Chief Complaint  Patient presents with   Follow-up    3 mo    Knee hurt a lot      Past Medical History:  Diagnosis Date   Benign hematuria    CAD (coronary artery disease)    Chronic kidney disease    Hyperlipidemia    Hypertension      Past Surgical History:  Procedure Laterality Date   cardiac stents     x2   COLONOSCOPY WITH PROPOFOL N/A 05/10/2018   Procedure: COLONOSCOPY WITH PROPOFOL;  Surgeon: Wyline Mood, MD;  Location: [facility] ENDOSCOPY;  Service: Gastroenterology;  Laterality: N/A;   EYE SURGERY Right 2021   cataract sx in [place]      Current Outpatient Medications  Medication Sig Dispense Refill   allopurinol (ZYLOPRIM) 300 MG tablet Take 0.5 tablets (150 mg total) by mouth daily. 100 tablet 1   amLODipine (NORVASC) 10 MG tablet Take 1 tablet (10 mg total) by mouth every evening. 100 tablet 1   aspirin 81 MG tablet Take 81 mg by mouth daily.     baclofen (LIORESAL) 10 MG tablet Take 1 tablet (10 mg total) by mouth at bedtime as needed for muscle spasms. 30 each 0   benazepril (LOTENSIN) 40 MG tablet Take 1 tablet (40 mg total) by mouth daily. 100 tablet 1   carvedilol (COREG) 25 MG tablet Take 1 tablet (25 mg total) by mouth 2 (two) times daily with a meal. 60 tablet 2   clopidogrel (PLAVIX) 75 MG tablet TAKE 1 TABLET BY MOUTH DAILY 100 tablet 1   levothyroxine (SYNTHROID) 50 MCG tablet TAKE 1 TABLET BY MOUTH DAILY 100 tablet 1   prednisoLONE acetate (PRED FORTE) 1 % ophthalmic suspension Place 1 drop into the right eye 3 (three) times daily. (Patient not taking: Reported on 05/31/2023)     rosuvastatin (CRESTOR) 40 MG tablet Take 1 tablet (40 mg total) by mouth daily. 100 tablet 1   No current  facility-administered medications for this visit.    Allergies:   Hydrochlorothiazide    Social History:   reports that he has never smoked. He quit smokeless tobacco use about 12 years ago.  His smokeless tobacco use included chew. He reports that he does not drink alcohol and does not use drugs.   Family History:  family history includes Diabetes in his sister and son; Hypertension in his mother.    ROS:     Review of Systems  Constitutional: Negative.   HENT: Negative.    Eyes: Negative.   Respiratory: Negative.    Gastrointestinal: Negative.   Genitourinary: Negative.   Musculoskeletal: Negative.   Skin: Negative.   Neurological: Negative.   Endo/Heme/Allergies: Negative.   Psychiatric/Behavioral: Negative.    All other systems reviewed and are negative.     All other systems are reviewed and negative.    PHYSICAL EXAM: VS:  BP (!) 140/80   Pulse (!) 56   Ht 5\' 7"  (1.702 m)   Wt 226 lb 9.6 oz (102.8 kg)   SpO2 99%   BMI 35.49 kg/m  , BMI Body mass index is 35.49 kg/m. Last weight:  Wt Readings from Last 3 Encounters:  06/21/23 226 lb 9.6 oz (102.8 kg)  05/31/23 230 lb 3.2 oz (104.4 kg)  04/06/23 226 lb 12.8 oz (102.9 kg)     Physical Exam Vitals reviewed.  Constitutional:      Appearance: Normal appearance. He is normal weight.  HENT:     Head: Normocephalic.     Nose: Nose normal.     Mouth/Throat:     Mouth: Mucous membranes are moist.  Eyes:     Pupils: Pupils are equal, round, and reactive to light.  Cardiovascular:     Rate and Rhythm: Normal rate and regular rhythm.     Pulses: Normal pulses.     Heart sounds: Normal heart sounds.  Pulmonary:     Effort: Pulmonary effort is normal.  Abdominal:     General: Abdomen is flat. Bowel sounds are normal.  Musculoskeletal:        General: Normal range of motion.     Cervical back: Normal range of motion.  Skin:    General: Skin is warm.  Neurological:     General: No focal deficit present.      Mental Status: He is alert.  Psychiatric:        Mood and Affect: Mood normal.       EKG:   Recent Labs: 04/06/2023: ALT 50; BUN 24; Creatinine, Ser 1.34; Hemoglobin 14.1; Platelets 328; Potassium 4.7; Sodium 141; TSH 1.770    Lipid Panel    Component Value Date/Time   CHOL 122 04/06/2023 1332   CHOL 98 05/14/2019 1300   TRIG 101 04/06/2023 1332   TRIG 205 (H) 05/14/2019 1300   HDL 42 04/06/2023 1332   CHOLHDL 2.9 05/24/2021 0823   VLDL 41 (H) 05/14/2019 1300   LDLCALC 61 04/06/2023 1332      Other studies Reviewed: Additional studies/ records that were reviewed today include:  Review of the above records demonstrates:       No data to display            ASSESSMENT AND PLAN:    ICD-10-CM   1. 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 Essential hypertension  I10    repeat BP 140/70, advise low salt diet and exercise, if no change will go up on medication    3. CKD (chronic kidney disease), stage II  N18.2     4. Mixed hyperlipidemia  E78.2     5. Morbid obesity (HCC)  E66.01        Problem List Items Addressed This Visit       Cardiovascular and Mediastinum   CAD (coronary artery disease) - Primary   Essential hypertension     Genitourinary   CKD (chronic kidney disease), stage II     Other   Hyperlipidemia   Morbid obesity (HCC)       Disposition:   Return in about 3 months (around 09/21/2023).    Total time spent: 30 minutes  Signed,  Adrian Blackwater, MD  06/21/2023 10:14 AM    [facility]

## 2023-06-22 ENCOUNTER — Other Ambulatory Visit: Payer: Self-pay | Admitting: Family Medicine

## 2023-06-22 NOTE — Telephone Encounter (Signed)
Requested medications are due for refill today.  yes  Requested medications are on the active medications list.  yes  Last refill. 05/31/2023 #30 0 rf  Future visit scheduled.   yes  Notes to clinic.  New medication to pt. Unclear if pt is to continue this medication. Please review for refill.    Requested Prescriptions  Pending Prescriptions Disp Refills   baclofen (LIORESAL) 10 MG tablet [Pharmacy Med Name: BACLOFEN 10 MG TABLET] 90 tablet 1    Sig: TAKE 1 TABLET BY MOUTH AT BEDTIME AS NEEDED FOR MUSCLE SPASMS     Analgesics:  Muscle Relaxants - baclofen Failed - 06/22/2023  2:31 PM      Failed - Cr in normal range and within 180 days    Creatinine, Ser  Date Value Ref Range Status  04/06/2023 1.34 (H) 0.76 - 1.27 mg/dL Final         Passed - eGFR is 30 or above and within 180 days    GFR calc Af Amer  Date Value Ref Range Status  06/24/2020 58 (L) >59 mL/min/1.73 Final    Comment:    **In accordance with recommendations from the NKF-ASN Task force,**   [facility] is in the process of updating its eGFR calculation to the   2021 CKD-EPI creatinine equation that estimates kidney function   without a race variable.    GFR, Estimated  Date Value Ref Range Status  03/28/2023 >60 >60 mL/min Final    Comment:    (NOTE) Calculated using the CKD-EPI Creatinine Equation (2021)    eGFR  Date Value Ref Range Status  04/06/2023 56 (L) >59 mL/min/1.73 Final         Passed - Valid encounter within last 6 months    Recent Outpatient Visits           3 weeks ago Flu vaccine need   [place] [facility] [place], Megan P, DO   2 months ago Essential hypertension   [facility] [place], Megan P, DO   4 months ago Acute pain of right knee   [facility] [place], Megan P, DO   4 months ago Acute pain of right knee   [facility] [place], Megan P, DO   4 months ago Acute pain of right knee    [place] [facility] Dorcas Carrow, DO       Future Appointments             In 3 months Laurier Nancy, MD [facility]   In 3 months Laural Benes, Oralia Rud, DO [facility]

## 2023-07-05 ENCOUNTER — Ambulatory Visit: Payer: Self-pay

## 2023-07-05 ENCOUNTER — Telehealth: Payer: Self-pay | Admitting: Family Medicine

## 2023-07-05 NOTE — Telephone Encounter (Signed)
  Chief Complaint: knee pain- bilateral pain, medication request Symptoms: knee pain- severe when active- patient states he has has injections and uses creams- Voltaren and others- requesting oral medication  Frequency: chronic Pertinent Negatives: Patient denies redness, swelling Disposition: [] ED /[] Urgent Care (no appt availability in office) / [] Appointment(In office/virtual)/ []  [place] [facility]/ [] Home Care/ [] Refused Recommended Disposition /[] [place] [facility]/ [x]  Follow-up with PCP Additional Notes: Medication request- patient requesting oral medication for knee pain   Reason for Disposition  Knee pain is a chronic symptom (recurrent or ongoing AND present > 4 weeks)  Answer Assessment - Initial Assessment Questions 1. LOCATION and RADIATION: "Where is the pain located?"      Both knees 2. QUALITY: "What does the pain feel like?"  (e.g., sharp, dull, aching, burning)     Pressure pain 3. SEVERITY: "How bad is the pain?" "What does it keep you from doing?"   (Scale 1-10; or mild, moderate, severe)   -  MILD (1-3): doesn't interfere with normal activities    -  MODERATE (4-7): interferes with normal activities (e.g., work or school) or awakens from sleep, limping    -  SEVERE (8-10): excruciating pain, unable to do any normal activities, unable to walk     7-8- severe with walking 4. ONSET: "When did the pain start?" "Does it come and go, or is it there all the time?"     Chronic- hx injection- last injection 1-2 months 5. RECURRENT: "Have you had this pain before?" If Yes, ask: "When, and what happened then?"     Yes- diagnosed with arthritis  7. AGGRAVATING FACTORS: "What makes the knee pain worse?" (e.g., walking, climbing stairs, running)     Activity causes pain 8. ASSOCIATED SYMPTOMS: "Is there any swelling or redness of the knee?"     no  Protocols used: Knee Pain-A-AH

## 2023-07-05 NOTE — Telephone Encounter (Signed)
Pt is calling in because he wants to know if Dr. Laural Benes is going to send in any medication for his knees. Pt is requesting someone call him back with information. Please follow up with pt.

## 2023-07-05 NOTE — Telephone Encounter (Signed)
Patient called, left VM on both numbers listed to return the call to the office to speak to the NT.      Pt is calling in because he has arthritis in his knees and says he has gotten shots for them before and wants to know if there is something that can be called in for the pain because Tylenol doesn't help

## 2023-07-06 MED ORDER — CELECOXIB 100 MG PO CAPS: 100.0000 mg | ORAL_CAPSULE | Freq: Two times a day (BID) | ORAL | 3 refills | Status: DC

## 2023-07-06 NOTE — Telephone Encounter (Signed)
Called and notified patient that medication has been sent in.

## 2023-07-06 NOTE — Addendum Note (Signed)
Addended by: Dorcas Carrow on: 07/06/2023 01:26 PM   Modules accepted: Orders

## 2023-07-06 NOTE — Telephone Encounter (Signed)
It was. I'll resend it

## 2023-07-06 NOTE — Telephone Encounter (Signed)
The patient called back in checking on the status of the medication for the pain in his knees. His says he wants it to go to   [id number] [place], [place] - [address] Phone: [phone]  Fax: [phone]     Please assist patient further.

## 2023-07-06 NOTE — Telephone Encounter (Signed)
Dr. Laural Benes, were you sending in Celebrex for this patient? I see where you wrote it today but it was not sent to the pharmacy. It states phone in on the RX in the chart. Was this for the patient's knee pain?

## 2023-07-26 ENCOUNTER — Other Ambulatory Visit: Payer: Self-pay | Admitting: Family Medicine

## 2023-07-26 DIAGNOSIS — I10 Essential (primary) hypertension: Secondary | ICD-10-CM

## 2023-07-27 NOTE — Telephone Encounter (Signed)
Requested Prescriptions  Pending Prescriptions Disp Refills   benazepril (LOTENSIN) 40 MG tablet [Pharmacy Med Name: Benazepril HCl 40 MG Oral Tablet] 100 tablet 1    Sig: TAKE 1 TABLET BY MOUTH DAILY     Cardiovascular:  ACE Inhibitors Failed - 07/26/2023 10:23 PM      Failed - Cr in normal range and within 180 days    Creatinine, Ser  Date Value Ref Range Status  04/06/2023 1.34 (H) 0.76 - 1.27 mg/dL Final         Failed - Last BP in normal range    BP Readings from Last 1 Encounters:  06/21/23 (!) 140/80         Passed - K in normal range and within 180 days    Potassium  Date Value Ref Range Status  04/06/2023 4.7 3.5 - 5.2 mmol/L Final         Passed - Patient is not pregnant      Passed - Valid encounter within last 6 months    Recent Outpatient Visits           1 month ago Flu vaccine need   [facility] [place], Megan P, DO   3 months ago Essential hypertension   [place] [facility] [place], Megan P, DO   5 months ago Acute pain of right knee   [place] [facility] [place], Megan P, DO   5 months ago Acute pain of right knee   [place] [facility] [place], Megan P, DO   5 months ago Acute pain of right knee   [place] [facility] Dorcas Carrow, DO       Future Appointments             In 1 month Laurier Nancy, MD [facility]   In 2 months Laural Benes, Oralia Rud, DO [place] [facility]

## 2023-07-29 DIAGNOSIS — S82134A Nondisplaced fracture of medial condyle of right tibia, initial encounter for closed fracture: Secondary | ICD-10-CM | POA: Diagnosis not present

## 2023-08-03 DIAGNOSIS — S82134A Nondisplaced fracture of medial condyle of right tibia, initial encounter for closed fracture: Secondary | ICD-10-CM | POA: Diagnosis not present

## 2023-08-06 DIAGNOSIS — S82134D Nondisplaced fracture of medial condyle of right tibia, subsequent encounter for closed fracture with routine healing: Secondary | ICD-10-CM | POA: Diagnosis not present

## 2023-08-21 ENCOUNTER — Other Ambulatory Visit: Payer: Self-pay | Admitting: Family Medicine

## 2023-08-22 NOTE — Telephone Encounter (Signed)
Requested Prescriptions  Pending Prescriptions Disp Refills   clopidogrel (PLAVIX) 75 MG tablet [Pharmacy Med Name: Clopidogrel Bisulfate 75 MG Oral Tablet] 100 tablet     Sig: TAKE 1 TABLET BY MOUTH DAILY     Hematology: Antiplatelets - clopidogrel Failed - 08/21/2023  4:59 AM      Failed - Cr in normal range and within 360 days    Creatinine, Ser  Date Value Ref Range Status  04/06/2023 1.34 (H) 0.76 - 1.27 mg/dL Final         Passed - HCT in normal range and within 180 days    Hematocrit  Date Value Ref Range Status  04/06/2023 42.6 37.5 - 51.0 % Final         Passed - HGB in normal range and within 180 days    Hemoglobin  Date Value Ref Range Status  04/06/2023 14.1 13.0 - 17.7 g/dL Final         Passed - PLT in normal range and within 180 days    Platelets  Date Value Ref Range Status  04/06/2023 328 150 - 450 x10E3/uL Final         Passed - Valid encounter within last 6 months    Recent Outpatient Visits           2 months ago Flu vaccine need   [place] [facility] [place], Megan P, DO   4 months ago Essential hypertension   [place] [facility] [place], Megan P, DO   6 months ago Acute pain of right knee   [facility] [place], Megan P, DO   6 months ago Acute pain of right knee   [place] [facility] [place], Megan P, DO   6 months ago Acute pain of right knee   [place] [facility] Dorcas Carrow, DO       Future Appointments             In 4 weeks Laurier Nancy, MD [facility]   In 1 month Laural Benes, Oralia Rud, DO [facility]

## 2023-08-24 ENCOUNTER — Other Ambulatory Visit: Payer: Self-pay | Admitting: Cardiovascular Disease

## 2023-08-24 DIAGNOSIS — I10 Essential (primary) hypertension: Secondary | ICD-10-CM

## 2023-08-27 ENCOUNTER — Telehealth: Payer: Self-pay | Admitting: Cardiovascular Disease

## 2023-08-27 DIAGNOSIS — I10 Essential (primary) hypertension: Secondary | ICD-10-CM

## 2023-08-27 DIAGNOSIS — I5033 Acute on chronic diastolic (congestive) heart failure: Secondary | ICD-10-CM

## 2023-08-27 MED ORDER — CARVEDILOL 25 MG PO TABS
25.0000 mg | ORAL_TABLET | Freq: Two times a day (BID) | ORAL | 11 refills | Status: DC
Start: 2023-08-27 — End: 2024-04-10

## 2023-08-27 NOTE — Telephone Encounter (Signed)
Pt called stating the pharm hasn't called about his Carvedilol 25 mg, looked & saw they need a [facility] [employer]  [facility]

## 2023-09-07 DIAGNOSIS — S82134D Nondisplaced fracture of medial condyle of right tibia, subsequent encounter for closed fracture with routine healing: Secondary | ICD-10-CM | POA: Diagnosis not present

## 2023-09-14 ENCOUNTER — Other Ambulatory Visit: Payer: Self-pay | Admitting: Family Medicine

## 2023-09-14 DIAGNOSIS — E785 Hyperlipidemia, unspecified: Secondary | ICD-10-CM

## 2023-09-18 NOTE — Telephone Encounter (Signed)
 Requested Prescriptions  Pending Prescriptions Disp Refills   amLODipine  (NORVASC ) 10 MG tablet [Pharmacy Med Name: amLODIPine  Besylate 10 MG Oral Tablet] 100 tablet 0    Sig: TAKE 1 TABLET BY MOUTH IN THE  EVENING     Cardiovascular: Calcium  Channel Blockers 2 Failed - 09/18/2023 11:32 AM      Failed - Last BP in normal range    BP Readings from Last 1 Encounters:  06/21/23 (!) 140/80         Passed - Last Heart Rate in normal range    Pulse Readings from Last 1 Encounters:  06/21/23 (!) 56         Passed - Valid encounter within last 6 months    Recent Outpatient Visits           3 months ago Flu vaccine need   [place] [facility] [place], [place], DO   5 months ago Essential hypertension   [place] [facility] [place], Megan P, DO   6 months ago Acute pain of right knee   [place] [facility] [place], Megan P, DO   7 months ago Acute pain of right knee   [place] [facility] [place], Megan P, DO   7 months ago Acute pain of right knee   [place] [facility] Vicci Duwaine SQUIBB, DO       Future Appointments             In 2 days Fernand Denyse LABOR, MD [facility]   In 2 weeks Vicci, Duwaine SQUIBB, DO  [facility]             rosuvastatin  (CRESTOR ) 40 MG tablet [Pharmacy Med Name: Rosuvastatin  Calcium  40 MG Oral Tablet] 100 tablet 1    Sig: TAKE 1 TABLET BY MOUTH DAILY     Cardiovascular:  Antilipid - Statins 2 Failed - 09/18/2023 11:32 AM      Failed - Cr in normal range and within 360 days    Creatinine, Ser  Date Value Ref Range Status  04/06/2023 1.34 (H) 0.76 - 1.27 mg/dL Final         Failed - Lipid Panel in normal range within the last 12 months    Cholesterol, Total  Date Value Ref Range Status  04/06/2023 122 100 - 199 mg/dL Final   Cholesterol Piccolo, Waived  Date Value Ref Range Status  05/14/2019 98 <200 mg/dL Final    Comment:                             Desirable                <200                         Borderline High      200- 239                         High                     >239    LDL Chol Calc (NIH)  Date Value Ref Range Status  04/06/2023 61 0 - 99 mg/dL Final   HDL  Date Value Ref Range Status  04/06/2023 42 >39 mg/dL Final   Triglycerides  Date Value Ref Range Status  04/06/2023 101 0 - 149 mg/dL Final   Triglycerides Piccolo,Waived  Date Value Ref Range Status  05/14/2019 205 (H) <150 mg/dL Final    Comment:                            Normal                   <150                         Borderline High     150 - 199                         High                200 - 499                         Very High                >499          Passed - Patient is not pregnant      Passed - Valid encounter within last 12 months    Recent Outpatient Visits           3 months ago Flu vaccine need   [place] [facility] [place], Megan P, DO   5 months ago Essential hypertension   [place] [facility] [place], Megan P, DO   6 months ago Acute pain of right knee   [place] [facility] [place], Megan P, DO   7 months ago Acute pain of right knee   [place] [facility] [place], Megan P, DO   7 months ago Acute pain of right knee   [place] [facility] Vicci Duwaine SQUIBB, DO       Future Appointments             In 2 days Fernand Denyse LABOR, MD [facility]   In 2 weeks Vicci, Duwaine SQUIBB, DO [facility]

## 2023-09-20 ENCOUNTER — Encounter: Payer: Self-pay | Admitting: Cardiovascular Disease

## 2023-09-20 ENCOUNTER — Ambulatory Visit: Payer: Medicare Other | Admitting: Cardiovascular Disease

## 2023-09-20 VITALS — BP 138/82 | HR 72 | Ht 67.0 in | Wt 227.0 lb

## 2023-09-20 DIAGNOSIS — E782 Mixed hyperlipidemia: Secondary | ICD-10-CM

## 2023-09-20 DIAGNOSIS — I10 Essential (primary) hypertension: Secondary | ICD-10-CM

## 2023-09-20 DIAGNOSIS — I25738 Atherosclerosis of nonautologous biological coronary artery bypass graft(s) with other forms of angina pectoris: Secondary | ICD-10-CM | POA: Diagnosis not present

## 2023-09-20 DIAGNOSIS — N182 Chronic kidney disease, stage 2 (mild): Secondary | ICD-10-CM | POA: Diagnosis not present

## 2023-09-20 NOTE — Progress Notes (Signed)
 Cardiology Office Note   Date:  09/20/2023   ID:  Ricky Melton, DOB 1950/12/28, MRN [id number]  PCP:  Ricky Duwaine SQUIBB, DO  Cardiologist:  Denyse Bathe, MD      History of Present Illness: Ricky Melton is a 73 y.o. male who presents for  Chief Complaint  Patient presents with   Follow-up    Doing fine, but has knee pain      Past Medical History:  Diagnosis Date   Benign hematuria    CAD (coronary artery disease)    Chronic kidney disease    Hyperlipidemia    Hypertension      Past Surgical History:  Procedure Laterality Date   cardiac stents     x2   COLONOSCOPY WITH PROPOFOL  N/A 05/10/2018   Procedure: COLONOSCOPY WITH PROPOFOL ;  Surgeon: Therisa Bi, MD;  Location: [facility] ENDOSCOPY;  Service: Gastroenterology;  Laterality: N/A;   EYE SURGERY Right 2021   cataract sx in [place]      Current Outpatient Medications  Medication Sig Dispense Refill   allopurinol  (ZYLOPRIM ) 300 MG tablet Take 0.5 tablets (150 mg total) by mouth daily. 100 tablet 1   amLODipine  (NORVASC ) 10 MG tablet TAKE 1 TABLET BY MOUTH IN THE  EVENING 100 tablet 0   aspirin  81 MG tablet Take 81 mg by mouth daily.     baclofen  (LIORESAL ) 10 MG tablet Take 1 tablet (10 mg total) by mouth at bedtime as needed for muscle spasms. 30 each 0   benazepril  (LOTENSIN ) 40 MG tablet TAKE 1 TABLET BY MOUTH DAILY 100 tablet 1   carvedilol  (COREG ) 25 MG tablet TAKE 1 TABLET (25 MG TOTAL) BY MOUTH TWICE A DAY WITH MEALS 180 tablet 1   carvedilol  (COREG ) 25 MG tablet Take 1 tablet (25 mg total) by mouth 2 (two) times daily. 60 tablet 11   celecoxib  (CELEBREX ) 100 MG capsule Take 1 capsule (100 mg total) by mouth 2 (two) times daily. 60 capsule 3   clopidogrel  (PLAVIX ) 75 MG tablet TAKE 1 TABLET BY MOUTH DAILY 100 tablet 0   levothyroxine  (SYNTHROID ) 50 MCG tablet TAKE 1 TABLET BY MOUTH DAILY 100 tablet 1   prednisoLONE  acetate (PRED FORTE ) 1 % ophthalmic suspension Place 1 drop into the right eye 3  (three) times daily. (Patient not taking: Reported on 05/31/2023)     rosuvastatin  (CRESTOR ) 40 MG tablet TAKE 1 TABLET BY MOUTH DAILY 100 tablet 1   No current facility-administered medications for this visit.    Allergies:   Hydrochlorothiazide     Social History:   reports that he has never smoked. He quit smokeless tobacco use about 13 years ago.  His smokeless tobacco use included chew. He reports that he does not drink alcohol and does not use drugs.   Family History:  family history includes Diabetes in his sister and son; Hypertension in his mother.    ROS:     Review of Systems  Constitutional: Negative.   HENT: Negative.    Eyes: Negative.   Respiratory: Negative.    Gastrointestinal: Negative.   Genitourinary: Negative.   Musculoskeletal: Negative.   Skin: Negative.   Neurological: Negative.   Endo/Heme/Allergies: Negative.   Psychiatric/Behavioral: Negative.    All other systems reviewed and are negative.     All other systems are reviewed and negative.    PHYSICAL EXAM: VS:  BP 138/82   Pulse 72   Ht 5' 7 (1.702 m)   Wt 227 lb (103  kg)   SpO2 98%   BMI 35.55 kg/m  , BMI Body mass index is 35.55 kg/m. Last weight:  Wt Readings from Last 3 Encounters:  09/20/23 227 lb (103 kg)  06/21/23 226 lb 9.6 oz (102.8 kg)  05/31/23 230 lb 3.2 oz (104.4 kg)     Physical Exam Vitals reviewed.  Constitutional:      Appearance: Normal appearance. He is normal weight.  HENT:     Head: Normocephalic.     Nose: Nose normal.     Mouth/Throat:     Mouth: Mucous membranes are moist.  Eyes:     Pupils: Pupils are equal, round, and reactive to light.  Cardiovascular:     Rate and Rhythm: Normal rate and regular rhythm.     Pulses: Normal pulses.     Heart sounds: Normal heart sounds.  Pulmonary:     Effort: Pulmonary effort is normal.  Abdominal:     General: Abdomen is flat. Bowel sounds are normal.  Musculoskeletal:        General: Normal range of motion.      Cervical back: Normal range of motion.  Skin:    General: Skin is warm.  Neurological:     General: No focal deficit present.     Mental Status: He is alert.  Psychiatric:        Mood and Affect: Mood normal.       EKG:   Recent Labs: 04/06/2023: ALT 50; BUN 24; Creatinine, Ser 1.34; Hemoglobin 14.1; Platelets 328; Potassium 4.7; Sodium 141; TSH 1.770    Lipid Panel    Component Value Date/Time   CHOL 122 04/06/2023 1332   CHOL 98 05/14/2019 1300   TRIG 101 04/06/2023 1332   TRIG 205 (H) 05/14/2019 1300   HDL 42 04/06/2023 1332   CHOLHDL 2.9 05/24/2021 0823   VLDL 41 (H) 05/14/2019 1300   LDLCALC 61 04/06/2023 1332      Other studies Reviewed: Additional studies/ records that were reviewed today include:  Review of the above records demonstrates:       No data to display            ASSESSMENT AND PLAN:    ICD-10-CM   1. 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 Essential hypertension  I10    stopped taking celebrex , but still elevated, but repeat is 130/70. Continue low salt diet    3. CKD (chronic kidney disease), stage II  N18.2     4. Mixed hyperlipidemia  E78.2     5. Morbid obesity (HCC)  E66.01        Problem List Items Addressed This Visit       Cardiovascular and Mediastinum   CAD (coronary artery disease) - Primary   Essential hypertension     Genitourinary   CKD (chronic kidney disease), stage II     Other   Hyperlipidemia   Morbid obesity (HCC)       Disposition:   Return in about 3 months (around 12/19/2023).    Total time spent: 30 minutes  Signed,  Denyse Bathe, MD  09/20/2023 9:34 AM    [facility]

## 2023-10-04 ENCOUNTER — Encounter: Payer: Self-pay | Admitting: Family Medicine

## 2023-10-04 ENCOUNTER — Ambulatory Visit (INDEPENDENT_AMBULATORY_CARE_PROVIDER_SITE_OTHER): Payer: Medicare Other | Admitting: Family Medicine

## 2023-10-04 VITALS — BP 136/62 | HR 71 | Wt 232.6 lb

## 2023-10-04 DIAGNOSIS — E039 Hypothyroidism, unspecified: Secondary | ICD-10-CM | POA: Diagnosis not present

## 2023-10-04 DIAGNOSIS — M170 Bilateral primary osteoarthritis of knee: Secondary | ICD-10-CM

## 2023-10-04 DIAGNOSIS — Z0000 Encounter for general adult medical examination without abnormal findings: Secondary | ICD-10-CM

## 2023-10-04 DIAGNOSIS — N182 Chronic kidney disease, stage 2 (mild): Secondary | ICD-10-CM | POA: Diagnosis not present

## 2023-10-04 DIAGNOSIS — R252 Cramp and spasm: Secondary | ICD-10-CM

## 2023-10-04 DIAGNOSIS — I10 Essential (primary) hypertension: Secondary | ICD-10-CM | POA: Diagnosis not present

## 2023-10-04 DIAGNOSIS — M1000 Idiopathic gout, unspecified site: Secondary | ICD-10-CM | POA: Diagnosis not present

## 2023-10-04 DIAGNOSIS — E785 Hyperlipidemia, unspecified: Secondary | ICD-10-CM | POA: Diagnosis not present

## 2023-10-04 DIAGNOSIS — I25738 Atherosclerosis of nonautologous biological coronary artery bypass graft(s) with other forms of angina pectoris: Secondary | ICD-10-CM

## 2023-10-04 DIAGNOSIS — N400 Enlarged prostate without lower urinary tract symptoms: Secondary | ICD-10-CM

## 2023-10-04 LAB — MICROALBUMIN, URINE WAIVED
Creatinine, Urine Waived: 100 mg/dL (ref 10–300)
Microalb, Ur Waived: 150 mg/L — ABNORMAL HIGH (ref 0–19)
Microalb/Creat Ratio: >300 mg/g — ABNORMAL HIGH (ref <30)

## 2023-10-04 MED ORDER — ROSUVASTATIN CALCIUM 40 MG PO TABS: 40.0000 mg | ORAL_TABLET | Freq: Every day | ORAL | 1 refills | Status: DC

## 2023-10-04 MED ORDER — BENAZEPRIL HCL 40 MG PO TABS: 40.0000 mg | ORAL_TABLET | Freq: Every day | ORAL | 1 refills | Status: DC

## 2023-10-04 MED ORDER — CLOPIDOGREL BISULFATE 75 MG PO TABS: 75.0000 mg | ORAL_TABLET | Freq: Every day | ORAL | 1 refills | Status: DC

## 2023-10-04 MED ORDER — ALLOPURINOL 300 MG PO TABS
150.0000 mg | ORAL_TABLET | Freq: Every day | ORAL | 1 refills | Status: DC
Start: 2023-10-04 — End: 2024-04-10

## 2023-10-04 MED ORDER — AMLODIPINE BESYLATE 10 MG PO TABS: 10.0000 mg | ORAL_TABLET | Freq: Every evening | ORAL | 1 refills | Status: DC

## 2023-10-04 NOTE — Assessment & Plan Note (Signed)
Under good control on current regimen. Continue current regimen. Continue to monitor. Call with any concerns. Refills given. Labs drawn today.   

## 2023-10-04 NOTE — Assessment & Plan Note (Signed)
Encouraged diet and exercise with goal of losing 1-2lbs a week.

## 2023-10-04 NOTE — Assessment & Plan Note (Signed)
Rechecking labs today. Await results. Treat as needed.  °

## 2023-10-04 NOTE — Assessment & Plan Note (Signed)
Will keep BP and cholesterol under good control. Continue to monitor. Call with any concerns.  

## 2023-10-04 NOTE — Progress Notes (Signed)
BP 136/62   Pulse 71   Wt 232 lb 9.6 oz (105.5 kg)   SpO2 97%   BMI 36.43 kg/m    Subjective:    Patient ID: Ricky Melton, male    DOB: 11-05-1950, 73 y.o.   MRN: [id number]  HPI: Ricky Melton is a 73 y.o. male presenting on 10/04/2023 for comprehensive medical examination. Current medical complaints include:  HYPERTENSION / HYPERLIPIDEMIA Satisfied with current treatment? yes Duration of hypertension: chronic BP monitoring frequency: not checking BP medication side effects: no Past BP meds: carvedilol, amlodipine, benazepril Duration of hyperlipidemia: chronic Cholesterol medication side effects: no Cholesterol supplements: none Past cholesterol medications: crestor Medication compliance: excellent compliance Aspirin: yes Recent stressors: no Recurrent headaches: no Visual changes: no Palpitations: no Dyspnea: no Chest pain: no Lower extremity edema: no Dizzy/lightheaded: no  No gout flares. Tolerating his medicine well.   R knee has really been hurting- following with ortho.   HYPOTHYROIDISM Thyroid control status:controlled Satisfied with current treatment? yes Medication side effects: no Medication compliance: poor compliance Recent dose adjustment:no Fatigue: no Cold intolerance: no Heat intolerance: no Weight gain: no Weight loss: no Constipation: no Diarrhea/loose stools: no Palpitations: no Lower extremity edema: no Anxiety/depressed mood: no  Interim Problems from his last visit: no  Depression Screen done today and results listed below:     10/04/2023    8:16 AM 05/31/2023    9:41 AM 04/06/2023    1:13 PM 02/22/2023    3:51 PM 02/19/2023    1:09 PM  Depression screen PHQ 2/9  Decreased Interest 0 0 0 0 0  Down, Depressed, Hopeless 0 0 0 0 0  PHQ - 2 Score 0 0 0 0 0  Altered sleeping 0 0 0 0 0  Tired, decreased energy 0 0 0 0 0  Change in appetite 0 0 0 0 0  Feeling bad or failure about yourself  0 0 0 0 0  Trouble concentrating 0 0 0  0 0  Moving slowly or fidgety/restless 0 0 0 0 0  Suicidal thoughts 0 0 0 0 0  PHQ-9 Score 0 0 0 0 0  Difficult doing work/chores Not difficult at all Not difficult at all Not difficult at all Not difficult at all Not difficult at all     Past Medical History:  Past Medical History:  Diagnosis Date   Benign hematuria    CAD (coronary artery disease)    Chronic kidney disease    Hyperlipidemia    Hypertension     Surgical History:  Past Surgical History:  Procedure Laterality Date   cardiac stents     x2   COLONOSCOPY WITH PROPOFOL N/A 05/10/2018   Procedure: COLONOSCOPY WITH PROPOFOL;  Surgeon: Wyline Mood, MD;  Location: [facility] ENDOSCOPY;  Service: Gastroenterology;  Laterality: N/A;   EYE SURGERY Right 2021   cataract sx in [place]     Medications:  Current Outpatient Medications on File Prior to Visit  Medication Sig   aspirin 81 MG tablet Take 81 mg by mouth daily.   carvedilol (COREG) 25 MG tablet Take 1 tablet (25 mg total) by mouth 2 (two) times daily.   levothyroxine (SYNTHROID) 50 MCG tablet TAKE 1 TABLET BY MOUTH DAILY   No current facility-administered medications on file prior to visit.    Allergies:  Allergies  Allergen Reactions   Hydrochlorothiazide Other (See Comments)    dizziness    Social History:  Social History   Socioeconomic History  Marital status: Married    Spouse name: Not on file   Number of children: Not on file   Years of education: Not on file   Highest education level: High school graduate  Occupational History   Occupation: maintenence     Comment: part time   Tobacco Use   Smoking status: Never   Smokeless tobacco: Former    Types: Chew    Quit date: 2012  Vaping Use   Vaping status: Never Used  Substance and Sexual Activity   Alcohol use: No    Alcohol/week: 0.0 standard drinks of alcohol   Drug use: No   Sexual activity: Yes  Other Topics Concern   Not on file  Social History Narrative   Plays golf with  friends, mows yards    Social Drivers of Health   Financial Resource Strain: Low Risk  (01/08/2023)   Overall Financial Resource Strain (CARDIA)    Difficulty of Paying Living Expenses: Not hard at all  Food Insecurity: No Food Insecurity (03/30/2023)   Hunger Vital Sign    Worried About Running Out of Food in the Last Year: Never true    Ran Out of Food in the Last Year: Never true  Transportation Needs: No Transportation Needs (03/30/2023)   PRAPARE - Administrator, Civil Service (Medical): No    Lack of Transportation (Non-Medical): No  Physical Activity: Sufficiently Active (01/08/2023)   Exercise Vital Sign    Days of Exercise per Week: 3 days    Minutes of Exercise per Session: 60 min  Stress: No Stress Concern Present (01/08/2023)   [employer] of Occupational Health - Occupational Stress Questionnaire    Feeling of Stress : Not at all  Social Connections: Socially Integrated (01/08/2023)   Social Connection and Isolation Panel [NHANES]    Frequency of Communication with Friends and Family: More than three times a week    Frequency of Social Gatherings with Friends and Family: More than three times a week    Attends Religious Services: More than 4 times per year    Active Member of [employer] or Organizations: Yes    Attends Engineer, structural: More than 4 times per year    Marital Status: Married  Catering manager Violence: Not At Risk (03/26/2023)   Humiliation, Afraid, Rape, and Kick questionnaire    Fear of Current or Ex-Partner: No    Emotionally Abused: No    Physically Abused: No    Sexually Abused: No   Social History   Tobacco Use  Smoking Status Never  Smokeless Tobacco Former   Types: Chew   Quit date: 2012   Social History   Substance and Sexual Activity  Alcohol Use No   Alcohol/week: 0.0 standard drinks of alcohol    Family History:  Family History  Problem Relation Age of Onset   Hypertension Mother    Diabetes Sister     Diabetes Son     Past medical history, surgical history, medications, allergies, family history and social history reviewed with patient today and changes made to appropriate areas of the chart.   Review of Systems  Constitutional:  Positive for diaphoresis. Negative for chills, fever, malaise/fatigue and weight loss.  HENT: Negative.    Eyes: Negative.   Respiratory: Negative.    Cardiovascular:  Positive for leg swelling. Negative for chest pain, palpitations, orthopnea, claudication and PND.  Gastrointestinal: Negative.   Genitourinary: Negative.   Musculoskeletal: Negative.   Skin: Negative.   Neurological:  Negative.   Endo/Heme/Allergies: Negative.   Psychiatric/Behavioral: Negative.     All other ROS negative except what is listed above and in the HPI.      Objective:    BP 136/62   Pulse 71   Wt 232 lb 9.6 oz (105.5 kg)   SpO2 97%   BMI 36.43 kg/m   Wt Readings from Last 3 Encounters:  10/04/23 232 lb 9.6 oz (105.5 kg)  09/20/23 227 lb (103 kg)  06/21/23 226 lb 9.6 oz (102.8 kg)    Physical Exam Vitals and nursing note reviewed.  Constitutional:      General: He is not in acute distress.    Appearance: Normal appearance. He is obese. He is not ill-appearing, toxic-appearing or diaphoretic.  HENT:     Head: Normocephalic and atraumatic.     Right Ear: Tympanic membrane, ear canal and external ear normal. There is no impacted cerumen.     Left Ear: Tympanic membrane, ear canal and external ear normal. There is no impacted cerumen.     Nose: Nose normal. No congestion or rhinorrhea.     Mouth/Throat:     Mouth: Mucous membranes are moist.     Pharynx: Oropharynx is clear. No oropharyngeal exudate or posterior oropharyngeal erythema.  Eyes:     General: No scleral icterus.       Right eye: No discharge.        Left eye: No discharge.     Extraocular Movements: Extraocular movements intact.     Conjunctiva/sclera: Conjunctivae normal.     Pupils: Pupils are  equal, round, and reactive to light.  Neck:     Vascular: No carotid bruit.  Cardiovascular:     Rate and Rhythm: Normal rate and regular rhythm.     Pulses: Normal pulses.     Heart sounds: No murmur heard.    No friction rub. No gallop.  Pulmonary:     Effort: Pulmonary effort is normal. No respiratory distress.     Breath sounds: Normal breath sounds. No stridor. No wheezing, rhonchi or rales.  Chest:     Chest wall: No tenderness.  Abdominal:     General: Abdomen is flat. Bowel sounds are normal. There is no distension.     Palpations: Abdomen is soft. There is no mass.     Tenderness: There is no abdominal tenderness. There is no right CVA tenderness, left CVA tenderness, guarding or rebound.     Hernia: No hernia is present.  Genitourinary:    Comments: Genital exam deferred with shared decision making Musculoskeletal:        General: No swelling, tenderness, deformity or signs of injury.     Cervical back: Normal range of motion and neck supple. No rigidity. No muscular tenderness.     Right lower leg: No edema.     Left lower leg: No edema.  Lymphadenopathy:     Cervical: No cervical adenopathy.  Skin:    General: Skin is warm and dry.     Capillary Refill: Capillary refill takes less than 2 seconds.     Coloration: Skin is not jaundiced or pale.     Findings: No bruising, erythema, lesion or rash.  Neurological:     General: No focal deficit present.     Mental Status: He is alert and oriented to person, place, and time.     Cranial Nerves: No cranial nerve deficit.     Sensory: No sensory deficit.     Motor: No  weakness.     Coordination: Coordination normal.     Gait: Gait normal.     Deep Tendon Reflexes: Reflexes normal.  Psychiatric:        Mood and Affect: Mood normal.        Behavior: Behavior normal.        Thought Content: Thought content normal.        Judgment: Judgment normal.     Results for orders placed or performed in visit on 10/04/23   Microalbumin, Urine Waived   Collection Time: 10/04/23  8:41 AM  Result Value Ref Range   Microalb, Ur Waived 150 (H) 0 - 19 mg/L   Creatinine, Urine Waived 100 10 - 300 mg/dL   Microalb/Creat Ratio >300 (H) <30 mg/g      Assessment & Plan:   Problem List Items Addressed This Visit       Cardiovascular and Mediastinum   CAD (coronary artery disease)   Will keep BP and cholesterol under good control. Continue to monitor. Call with any concerns.       Relevant Medications   amLODipine (NORVASC) 10 MG tablet   benazepril (LOTENSIN) 40 MG tablet   rosuvastatin (CRESTOR) 40 MG tablet   Essential hypertension   Under good control on current regimen. Continue current regimen. Continue to monitor. Call with any concerns. Refills given.  Labs drawn today.       Relevant Medications   amLODipine (NORVASC) 10 MG tablet   benazepril (LOTENSIN) 40 MG tablet   rosuvastatin (CRESTOR) 40 MG tablet   Other Relevant Orders   Comprehensive metabolic panel   CBC with Differential/Platelet   Microalbumin, Urine Waived (Completed)     Endocrine   Hypothyroidism   Rechecking labs today. Await results. Treat as needed.       Relevant Orders   TSH     Musculoskeletal and Integument   Degenerative arthritis of knee, bilateral   Encouraged BID tylenol for the next week or so- if no better, encouraged him to follow up with ortho. Call with any concerns.       Relevant Medications   allopurinol (ZYLOPRIM) 300 MG tablet     Genitourinary   CKD (chronic kidney disease), stage II   Rechecking labs today. Await results. Treat as needed.       BPH (benign prostatic hyperplasia)   Under good control on current regimen. Continue current regimen. Continue to monitor. Call with any concerns. Refills given.  Labs drawn today.       Relevant Orders   PSA     Other   Hyperlipidemia   Under good control on current regimen. Continue current regimen. Continue to monitor. Call with any  concerns. Refills given.  Labs drawn today.       Relevant Medications   amLODipine (NORVASC) 10 MG tablet   benazepril (LOTENSIN) 40 MG tablet   rosuvastatin (CRESTOR) 40 MG tablet   Other Relevant Orders   Comprehensive metabolic panel   CBC with Differential/Platelet   Lipid Panel w/o Chol/HDL Ratio   Gout   Under good control on current regimen. Continue current regimen. Continue to monitor. Call with any concerns. Refills given.  Labs drawn today.       Relevant Medications   allopurinol (ZYLOPRIM) 300 MG tablet   Other Relevant Orders   Comprehensive metabolic panel   CBC with Differential/Platelet   Morbid obesity (HCC)   Encouraged diet and exercise with goal of losing 1-2lbs a week.  Other Visit Diagnoses       Routine general medical examination at a health care facility    -  Primary   Vaccines up to date. Screening labs checked today. Colonoscopy up to date. Continue diet and exercise. Call with any concerns. Continue to monitor.     Cramps, muscle, general       Will check labs. Await results. Treat as needed.   Relevant Orders   Comprehensive metabolic panel   CBC with Differential/Platelet   VITAMIN D 25 Hydroxy (Vit-D Deficiency, Fractures)   Magnesium   Phosphorus       LABORATORY TESTING:  Health maintenance labs ordered today as discussed above.   The natural history of prostate cancer and ongoing controversy regarding screening and potential treatment outcomes of prostate cancer has been discussed with the patient. The meaning of a false positive PSA and a false negative PSA has been discussed. He indicates understanding of the limitations of this screening test and wishes to proceed with screening PSA testing.   IMMUNIZATIONS:   - Tdap: Tetanus vaccination status reviewed: last tetanus booster within 10 years. - Influenza: Up to date - Pneumovax: Up to date - Prevnar: Up to date - COVID: Up to date - HPV: Not applicable - Shingrix  vaccine: Refused  SCREENING: - Colonoscopy: Up to date  Discussed with patient purpose of the colonoscopy is to detect colon cancer at curable precancerous or early stages   PATIENT COUNSELING:    Sexuality: Discussed sexually transmitted diseases, partner selection, use of condoms, avoidance of unintended pregnancy  and contraceptive alternatives.   Advised to avoid cigarette smoking.  I discussed with the patient that most people either abstain from alcohol or drink within safe limits (<=14/week and <=4 drinks/occasion for males, <=7/weeks and <= 3 drinks/occasion for females) and that the risk for alcohol disorders and other health effects rises proportionally with the number of drinks per week and how often a drinker exceeds daily limits.  Discussed cessation/primary prevention of drug use and availability of treatment for abuse.   Diet: Encouraged to adjust caloric intake to maintain  or achieve ideal body weight, to reduce intake of dietary saturated fat and total fat, to limit sodium intake by avoiding high sodium foods and not adding table salt, and to maintain adequate dietary potassium and calcium preferably from fresh fruits, vegetables, and low-fat dairy products.    stressed the importance of regular exercise  Injury prevention: Discussed safety belts, safety helmets, smoke detector, smoking near bedding or upholstery.   Dental health: Discussed importance of regular tooth brushing, flossing, and dental visits.   Follow up plan: NEXT PREVENTATIVE PHYSICAL DUE IN 1 YEAR. No follow-ups on file.

## 2023-10-04 NOTE — Assessment & Plan Note (Signed)
Encouraged BID tylenol for the next week or so- if no better, encouraged him to follow up with ortho. Call with any concerns.

## 2023-10-05 LAB — COMPREHENSIVE METABOLIC PANEL
ALT: 16 [IU]/L (ref 0–44)
AST: 12 [IU]/L (ref 0–40)
Albumin: 4.3 g/dL (ref 3.8–4.8)
Alkaline Phosphatase: 56 [IU]/L (ref 44–121)
BUN/Creatinine Ratio: 12 (ref 10–24)
BUN: 16 mg/dL (ref 8–27)
Bilirubin Total: 0.4 mg/dL (ref 0.0–1.2)
CO2: 21 mmol/L (ref 20–29)
Calcium: 9.6 mg/dL (ref 8.6–10.2)
Chloride: 107 mmol/L — ABNORMAL HIGH (ref 96–106)
Creatinine, Ser: 1.38 mg/dL — ABNORMAL HIGH (ref 0.76–1.27)
Globulin, Total: 2.2 g/dL (ref 1.5–4.5)
Glucose: 92 mg/dL (ref 70–99)
Potassium: 4.5 mmol/L (ref 3.5–5.2)
Sodium: 143 mmol/L (ref 134–144)
Total Protein: 6.5 g/dL (ref 6.0–8.5)
eGFR: 54 mL/min/{1.73_m2} — ABNORMAL LOW (ref >59)

## 2023-10-05 LAB — CBC WITH DIFFERENTIAL/PLATELET
Basophils Absolute: 0.1 10*3/uL (ref 0.0–0.2)
Basos: 1 %
EOS (ABSOLUTE): 0.2 10*3/uL (ref 0.0–0.4)
Eos: 2 %
Hematocrit: 46.9 % (ref 37.5–51.0)
Hemoglobin: 14.8 g/dL (ref 13.0–17.7)
Immature Grans (Abs): 0 10*3/uL (ref 0.0–0.1)
Immature Granulocytes: 0 %
Lymphocytes Absolute: 2 10*3/uL (ref 0.7–3.1)
Lymphs: 30 %
MCH: 28.2 pg (ref 26.6–33.0)
MCHC: 31.6 g/dL (ref 31.5–35.7)
MCV: 90 fL (ref 79–97)
Monocytes Absolute: 0.5 10*3/uL (ref 0.1–0.9)
Monocytes: 7 %
Neutrophils Absolute: 4 10*3/uL (ref 1.4–7.0)
Neutrophils: 60 %
Platelets: 192 10*3/uL (ref 150–450)
RBC: 5.24 x10E6/uL (ref 4.14–5.80)
RDW: 13.5 % (ref 11.6–15.4)
WBC: 6.7 10*3/uL (ref 3.4–10.8)

## 2023-10-05 LAB — LIPID PANEL W/O CHOL/HDL RATIO
Cholesterol, Total: 131 mg/dL (ref 100–199)
HDL: 52 mg/dL (ref >39)
LDL Chol Calc (NIH): 65 mg/dL (ref 0–99)
Triglycerides: 66 mg/dL (ref 0–149)
VLDL Cholesterol Cal: 14 mg/dL (ref 5–40)

## 2023-10-05 LAB — PSA: Prostate Specific Ag, Serum: 2.8 ng/mL (ref 0.0–4.0)

## 2023-10-05 LAB — MAGNESIUM: Magnesium: 2.2 mg/dL (ref 1.6–2.3)

## 2023-10-05 LAB — VITAMIN D 25 HYDROXY (VIT D DEFICIENCY, FRACTURES): Vit D, 25-Hydroxy: 17.7 ng/mL — ABNORMAL LOW (ref 30.0–100.0)

## 2023-10-05 LAB — PHOSPHORUS: Phosphorus: 3.2 mg/dL (ref 2.8–4.1)

## 2023-10-05 LAB — TSH: TSH: 1.68 u[IU]/mL (ref 0.450–4.500)

## 2023-10-07 ENCOUNTER — Encounter: Payer: Self-pay | Admitting: Family Medicine

## 2023-10-07 ENCOUNTER — Other Ambulatory Visit: Payer: Self-pay | Admitting: Family Medicine

## 2023-10-07 DIAGNOSIS — E559 Vitamin D deficiency, unspecified: Secondary | ICD-10-CM | POA: Insufficient documentation

## 2023-10-07 DIAGNOSIS — E039 Hypothyroidism, unspecified: Secondary | ICD-10-CM

## 2023-10-07 MED ORDER — LEVOTHYROXINE SODIUM 50 MCG PO TABS: 50.0000 ug | ORAL_TABLET | Freq: Every day | ORAL | 2 refills | Status: DC

## 2023-10-07 MED ORDER — VITAMIN D (ERGOCALCIFEROL) 1.25 MG (50000 UNIT) PO CAPS: 50000.0000 [IU] | ORAL_CAPSULE | ORAL | 1 refills | Status: AC

## 2023-10-08 ENCOUNTER — Telehealth: Payer: Self-pay | Admitting: Family Medicine

## 2023-10-08 NOTE — Telephone Encounter (Signed)
Called patient to explain that he can have pharmacy send RX to mail order if he does not wish to pick them up at the pharmacy. Patient expressed understanding.

## 2023-10-08 NOTE — Telephone Encounter (Signed)
Pt is calling in requesting Dr. Henriette Combs nurse or Dr. Laural Benes give him a call back regarding his medication. Pt says he isn't familiar with one of them and wants to speak to his provider or her nurse about it. Please advise.

## 2023-10-18 ENCOUNTER — Encounter: Payer: Self-pay | Admitting: Family Medicine

## 2023-10-18 ENCOUNTER — Ambulatory Visit (INDEPENDENT_AMBULATORY_CARE_PROVIDER_SITE_OTHER): Payer: Medicare Other | Admitting: Family Medicine

## 2023-10-18 VITALS — BP 159/76 | HR 76 | Wt 234.8 lb

## 2023-10-18 DIAGNOSIS — R0981 Nasal congestion: Secondary | ICD-10-CM

## 2023-10-18 DIAGNOSIS — L600 Ingrowing nail: Secondary | ICD-10-CM

## 2023-10-18 MED ORDER — PREDNISONE 50 MG PO TABS: 50.0000 mg | ORAL_TABLET | Freq: Every day | ORAL | 0 refills | Status: DC

## 2023-10-18 NOTE — Progress Notes (Signed)
 BP (!) 159/76   Pulse 76   Wt 234 lb 12.8 oz (106.5 kg)   SpO2 97%   BMI 36.77 kg/m    Subjective:    Patient ID: Ricky Melton, male    DOB: [birth year], 73 y.o.   MRN: [id number]  HPI: Ricky Melton is a 73 y.o. male  Chief Complaint  Patient presents with   Procedure   UPPER RESPIRATORY TRACT INFECTION Duration: 2 days Worst symptom: congestion Fever: no Cough: no Shortness of breath: no Wheezing: no Chest pain: no Chest tightness: no Chest congestion: no Nasal congestion: yes Runny nose: yes Post nasal drip: no Sneezing: no Sore throat: no Swollen glands: no Sinus pressure: yes Headache: no Face pain: no Toothache: no Ear pain: no  Ear pressure: no  Eyes red/itching:no Eye drainage/crusting: no  Vomiting: no Rash: no Fatigue: yes Sick contacts: no Strep contacts: no  Context: worse Recurrent sinusitis: no Relief with OTC cold/cough medications: no  Treatments attempted: cold/sinus, mucinex , anti-histamine, pseudoephedrine, and cough syrup   TOE PAIN Duration:  chronic Involved toe: leftbig toe  Mechanism of injury: unknown Onset: gradual Severity: mild  Quality: aching and sore Frequency: constant Radiation: no Aggravating factors: tight shoes  Alleviating factors: getting nail cut at podiatry  Status: worse Treatments attempted: rest, heat, APAP, ibuprofen, and aleve  Relief with NSAIDs?: mild Morning stiffness: no Redness: no  Bruising: no Swelling: no Paresthesias / decreased sensation: no Fevers: no  Relevant past medical, surgical, family and social history reviewed and updated as indicated. Interim medical history since our last visit reviewed. Allergies and medications reviewed and updated.  Review of Systems  Constitutional: Negative.   HENT:  Positive for congestion, postnasal drip, rhinorrhea and sinus pressure. Negative for dental problem, drooling, ear discharge, ear pain, facial swelling, hearing loss, mouth sores,  nosebleeds, sinus pain, sneezing, sore throat, tinnitus, trouble swallowing and voice change.   Eyes: Negative.   Respiratory: Negative.    Cardiovascular: Negative.   Musculoskeletal:  Positive for myalgias. Negative for arthralgias, back pain, gait problem, joint swelling, neck pain and neck stiffness.  Skin: Negative.   Psychiatric/Behavioral: Negative.      Per HPI unless specifically indicated above     Objective:    BP (!) 159/76   Pulse 76   Wt 234 lb 12.8 oz (106.5 kg)   SpO2 97%   BMI 36.77 kg/m   Wt Readings from Last 3 Encounters:  10/18/23 234 lb 12.8 oz (106.5 kg)  10/04/23 232 lb 9.6 oz (105.5 kg)  09/20/23 227 lb (103 kg)    Physical Exam Vitals and nursing note reviewed.  Constitutional:      General: He is not in acute distress.    Appearance: Normal appearance. He is not ill-appearing, toxic-appearing or diaphoretic.  HENT:     Head: Normocephalic and atraumatic.     Right Ear: Tympanic membrane, ear canal and external ear normal. There is no impacted cerumen.     Left Ear: Tympanic membrane, ear canal and external ear normal. There is no impacted cerumen.     Nose: Nose normal. No congestion or rhinorrhea.     Mouth/Throat:     Mouth: Mucous membranes are moist.     Pharynx: Oropharynx is clear. No oropharyngeal exudate or posterior oropharyngeal erythema.  Eyes:     General: No scleral icterus.       Right eye: No discharge.        Left eye: No discharge.  Extraocular Movements: Extraocular movements intact.     Conjunctiva/sclera: Conjunctivae normal.     Pupils: Pupils are equal, round, and reactive to light.  Cardiovascular:     Rate and Rhythm: Normal rate and regular rhythm.     Pulses: Normal pulses.     Heart sounds: Normal heart sounds. No murmur heard.    No friction rub. No gallop.  Pulmonary:     Effort: Pulmonary effort is normal. No respiratory distress.     Breath sounds: Normal breath sounds. No stridor. No wheezing, rhonchi or  rales.  Chest:     Chest wall: No tenderness.  Musculoskeletal:        General: Normal range of motion.     Cervical back: Normal range of motion and neck supple.  Skin:    General: Skin is warm and dry.     Capillary Refill: Capillary refill takes less than 2 seconds.     Coloration: Skin is not jaundiced or pale.     Findings: No bruising, erythema, lesion or rash.     Comments: Ingrown toenail on L great toe  Neurological:     General: No focal deficit present.     Mental Status: He is alert and oriented to person, place, and time. Mental status is at baseline.  Psychiatric:        Mood and Affect: Mood normal.        Behavior: Behavior normal.        Thought Content: Thought content normal.        Judgment: Judgment normal.     Results for orders placed or performed in visit on 10/04/23  Comprehensive metabolic panel   Collection Time: 10/04/23  8:41 AM  Result Value Ref Range   Glucose 92 70 - 99 mg/dL   BUN 16 8 - 27 mg/dL   Creatinine, Ser 8.61 (H) 0.76 - 1.27 mg/dL   eGFR 54 (L) >40 fO/fpw/8.26   BUN/Creatinine Ratio 12 10 - 24   Sodium 143 134 - 144 mmol/L   Potassium 4.5 3.5 - 5.2 mmol/L   Chloride 107 (H) 96 - 106 mmol/L   CO2 21 20 - 29 mmol/L   Calcium  9.6 8.6 - 10.2 mg/dL   Total Protein 6.5 6.0 - 8.5 g/dL   Albumin 4.3 3.8 - 4.8 g/dL   Globulin, Total 2.2 1.5 - 4.5 g/dL   Bilirubin Total 0.4 0.0 - 1.2 mg/dL   Alkaline Phosphatase 56 44 - 121 IU/L   AST 12 0 - 40 IU/L   ALT 16 0 - 44 IU/L  CBC with Differential/Platelet   Collection Time: 10/04/23  8:41 AM  Result Value Ref Range   WBC 6.7 3.4 - 10.8 x10E3/uL   RBC 5.24 4.14 - 5.80 x10E6/uL   Hemoglobin 14.8 13.0 - 17.7 g/dL   Hematocrit 53.0 62.4 - 51.0 %   MCV 90 79 - 97 fL   MCH 28.2 26.6 - 33.0 pg   MCHC 31.6 31.5 - 35.7 g/dL   RDW 86.4 88.3 - 84.5 %   Platelets 192 150 - 450 x10E3/uL   Neutrophils 60 Not Estab. %   Lymphs 30 Not Estab. %   Monocytes 7 Not Estab. %   Eos 2 Not Estab. %    Basos 1 Not Estab. %   Neutrophils Absolute 4.0 1.4 - 7.0 x10E3/uL   Lymphocytes Absolute 2.0 0.7 - 3.1 x10E3/uL   Monocytes Absolute 0.5 0.1 - 0.9 x10E3/uL   EOS (ABSOLUTE) 0.2 0.0 -  0.4 x10E3/uL   Basophils Absolute 0.1 0.0 - 0.2 x10E3/uL   Immature Granulocytes 0 Not Estab. %   Immature Grans (Abs) 0.0 0.0 - 0.1 x10E3/uL  Lipid Panel w/o Chol/HDL Ratio   Collection Time: 10/04/23  8:41 AM  Result Value Ref Range   Cholesterol, Total 131 100 - 199 mg/dL   Triglycerides 66 0 - 149 mg/dL   HDL 52 >60 mg/dL   VLDL Cholesterol Cal 14 5 - 40 mg/dL   LDL Chol Calc (NIH) 65 0 - 99 mg/dL  PSA   Collection Time: 10/04/23  8:41 AM  Result Value Ref Range   Prostate Specific Ag, Serum 2.8 0.0 - 4.0 ng/mL  TSH   Collection Time: 10/04/23  8:41 AM  Result Value Ref Range   TSH 1.680 0.450 - 4.500 uIU/mL  Microalbumin, Urine Waived   Collection Time: 10/04/23  8:41 AM  Result Value Ref Range   Microalb, Ur Waived 150 (H) 0 - 19 mg/L   Creatinine, Urine Waived 100 10 - 300 mg/dL   Microalb/Creat Ratio >300 (H) <30 mg/g  VITAMIN D  25 Hydroxy (Vit-D Deficiency, Fractures)   Collection Time: 10/04/23  8:41 AM  Result Value Ref Range   Vit D, 25-Hydroxy 17.7 (L) 30.0 - 100.0 ng/mL  Magnesium   Collection Time: 10/04/23  8:41 AM  Result Value Ref Range   Magnesium 2.2 1.6 - 2.3 mg/dL  Phosphorus   Collection Time: 10/04/23  8:41 AM  Result Value Ref Range   Phosphorus 3.2 2.8 - 4.1 mg/dL      Assessment & Plan:   Problem List Items Addressed This Visit   None Visit Diagnoses       Ingrown left greater toenail    -  Primary   Removed today as below.     Nasal congestion       Will treat with prednisone . Call with any concerns or if not getting better. Continue to monitor.       Procedure: Complete Toenail Removal Diagnosis:    ICD-10-CM   1. Ingrown left greater toenail  L60.0    Removed today as below.    2. Nasal congestion  R09.81    Will treat with prednisone . Call  with any concerns or if not getting better. Continue to monitor.     Physican: Khizar Fiorella, DO Consent: Risks, benefits, and alternative treatments discussed and all questions were answered.  Patient elected to proceed and verbal consent obtained Description:  Area prepped and draped using  sterile technique. Digital block of the  Left  1st toe performed by injecting local anesthetic at the base of the toe at the 2 oclock and 10 oclock positions, using 7 cc's of  1% lidocaine  plain. After confirming adequate anesthesia, lateral and medial nail folds and epinychia were freed up using periosteal elevator.  Using scissors the nail was vertically cut beyond the epinychia to the base.  A hemostat was then used to remove the nail fragment. The nail was grasped using a hemostat and the nail was removed intact. Phenol was applied to the nail matrix x 3 using a cotton applicator tip.   Bacitracin ointment was applied to the operative site a circumferential gauze dressive was applied.  The patient tolerated the procedure well.  Complications: none Estimated Blood Loss: minimal Post Procedure Instructions: The patient was encouraged to keep the dressing in place for 24 hours and keep the foot elevated as much as possible during this time.  After the  first day they are instructed to soak the toe in warm water 3 times daily for 3-4 days.  Antibiotic ointment is to be applied daily for 1 week.  The patient was informed that some oozing is to be expected for 1-2 weeks but that they should return immediately for pus, increased pain or redness.  They were instructed to take APAP or motrin as needed for post operative discomfort.    Follow up plan: Return if symptoms worsen or fail to improve.

## 2023-10-18 NOTE — Addendum Note (Signed)
 Addended by: Solomon Dupre on: 10/18/2023 04:37 PM   Modules accepted: Level of Service

## 2023-11-20 ENCOUNTER — Ambulatory Visit: Payer: Self-pay | Admitting: Family Medicine

## 2023-11-20 NOTE — Telephone Encounter (Signed)
 Routing to provider to advise.

## 2023-11-20 NOTE — Telephone Encounter (Signed)
 Copied from CRM [id number]. Topic: Clinical - Medical Advice >> Nov 20, 2023  8:42 AM Patsy Lager T wrote: Reason for CRM: patient called to see if he can take Turmeric for the pain in his knee. He wants to make sure he can mix it with his other medications    Patient stated his knee pain has been going on for a while and the provider knows about it. He called to asked if he can take Tumeric to help with the pain. He stated that someone told him it wasn't safe to take Tumeric if he is on blood thinners and he wanted the provider's recommendation.

## 2023-11-21 ENCOUNTER — Telehealth: Payer: Self-pay | Admitting: Family Medicine

## 2023-11-21 NOTE — Telephone Encounter (Signed)
 Another phone call regarding this has already been sent to the provider. Will close this encounter.

## 2023-11-21 NOTE — Telephone Encounter (Signed)
 Copied from CRM [id number]. Topic: Clinical - Medication Question >> Nov 21, 2023 11:01 AM Ricky Melton wrote: Reason for CRM: patient called to see if he can take Turmeric for the pain in his knee. He wants to make sure he can mix it with his other medications       Patient stated his knee pain has been going on for a while and the provider knows about it. He called to asked if he can take Tumeric to help with the pain. He stated that someone told him it wasn't safe to take Tumeric if he is on blood thinners and he wanted the provider's recommendation.    Callback #: [phone] (Mobile)

## 2023-11-26 DIAGNOSIS — N281 Cyst of kidney, acquired: Secondary | ICD-10-CM | POA: Diagnosis not present

## 2023-11-26 DIAGNOSIS — M1000 Idiopathic gout, unspecified site: Secondary | ICD-10-CM | POA: Diagnosis not present

## 2023-11-26 DIAGNOSIS — I10 Essential (primary) hypertension: Secondary | ICD-10-CM | POA: Diagnosis not present

## 2023-11-26 DIAGNOSIS — R809 Proteinuria, unspecified: Secondary | ICD-10-CM | POA: Diagnosis not present

## 2023-11-27 ENCOUNTER — Encounter: Payer: Self-pay | Admitting: Family Medicine

## 2023-11-27 ENCOUNTER — Ambulatory Visit (INDEPENDENT_AMBULATORY_CARE_PROVIDER_SITE_OTHER): Admitting: Family Medicine

## 2023-11-27 VITALS — BP 138/74 | HR 65 | Temp 98.8°F | Resp 16 | Ht 67.01 in | Wt 239.2 lb

## 2023-11-27 DIAGNOSIS — L03032 Cellulitis of left toe: Secondary | ICD-10-CM

## 2023-11-27 MED ORDER — SULFAMETHOXAZOLE-TRIMETHOPRIM 800-160 MG PO TABS: 1.0000 | ORAL_TABLET | Freq: Two times a day (BID) | ORAL | 0 refills | Status: DC

## 2023-11-27 NOTE — Progress Notes (Signed)
 BP 138/74 (BP Location: Left Arm, Patient Position: Sitting, Cuff Size: Large)   Pulse 65   Temp 98.8 F (37.1 C) (Oral)   Resp 16   Ht 5' 7.01" (1.702 m)   Wt 239 lb 3.2 oz (108.5 kg)   SpO2 98%   BMI 37.46 kg/m    Subjective:    Patient ID: Ricky Melton, male    DOB: 1950/10/10, 73 y.o.   MRN: [id number]  HPI: Ricky Melton is a 73 y.o. male  Chief Complaint  Patient presents with   Nail Problem    Complains that since the matrixectomy that it has remained red and not healed. He is concerned for infection.    Had toenail removed 6 weeks ago. Started with some pain and oozing in the past couple of days SKIN INFECTION Duration: few days Location: R toe History of trauma in area: yes Pain: mild Quality: tender Severity: mild Redness: yes Swelling: yes Oozing: yes Pus: yes Fevers: no Nausea/vomiting: no Status: worse Treatments attempted:peroxide Tetanus: UTD   Relevant past medical, surgical, family and social history reviewed and updated as indicated. Interim medical history since our last visit reviewed. Allergies and medications reviewed and updated.  Review of Systems  Constitutional: Negative.   Respiratory: Negative.    Cardiovascular: Negative.   Gastrointestinal: Negative.   Musculoskeletal: Negative.   Skin:  Positive for color change and wound. Negative for pallor and rash.  Psychiatric/Behavioral: Negative.      Per HPI unless specifically indicated above     Objective:    BP 138/74 (BP Location: Left Arm, Patient Position: Sitting, Cuff Size: Large)   Pulse 65   Temp 98.8 F (37.1 C) (Oral)   Resp 16   Ht 5' 7.01" (1.702 m)   Wt 239 lb 3.2 oz (108.5 kg)   SpO2 98%   BMI 37.46 kg/m   Wt Readings from Last 3 Encounters:  11/27/23 239 lb 3.2 oz (108.5 kg)  10/18/23 234 lb 12.8 oz (106.5 kg)  10/04/23 232 lb 9.6 oz (105.5 kg)    Physical Exam Vitals and nursing note reviewed.  Constitutional:      General: He is not in acute  distress.    Appearance: Normal appearance. He is not ill-appearing, toxic-appearing or diaphoretic.  HENT:     Head: Normocephalic and atraumatic.     Right Ear: External ear normal.     Left Ear: External ear normal.     Nose: Nose normal.     Mouth/Throat:     Mouth: Mucous membranes are moist.     Pharynx: Oropharynx is clear.  Eyes:     General: No scleral icterus.       Right eye: No discharge.        Left eye: No discharge.     Extraocular Movements: Extraocular movements intact.     Conjunctiva/sclera: Conjunctivae normal.     Pupils: Pupils are equal, round, and reactive to light.  Cardiovascular:     Rate and Rhythm: Normal rate and regular rhythm.     Pulses: Normal pulses.     Heart sounds: Normal heart sounds. No murmur heard.    No friction rub. No gallop.  Pulmonary:     Effort: Pulmonary effort is normal. No respiratory distress.     Breath sounds: Normal breath sounds. No stridor. No wheezing, rhonchi or rales.  Chest:     Chest wall: No tenderness.  Musculoskeletal:        General:  Normal range of motion.     Cervical back: Normal range of motion and neck supple.  Skin:    General: Skin is warm and dry.     Capillary Refill: Capillary refill takes less than 2 seconds.     Coloration: Skin is not jaundiced or pale.     Findings: Erythema (redness and swelling with heat and mild pus on proximal medial border of L great toe) present. No bruising, lesion or rash.  Neurological:     General: No focal deficit present.     Mental Status: He is alert and oriented to person, place, and time. Mental status is at baseline.  Psychiatric:        Mood and Affect: Mood normal.        Behavior: Behavior normal.        Thought Content: Thought content normal.        Judgment: Judgment normal.     Results for orders placed or performed in visit on 10/04/23  Comprehensive metabolic panel   Collection Time: 10/04/23  8:41 AM  Result Value Ref Range   Glucose 92 70 - 99  mg/dL   BUN 16 8 - 27 mg/dL   Creatinine, Ser 2.72 (H) 0.76 - 1.27 mg/dL   eGFR 54 (L) >53 GU/YQI/3.47   BUN/Creatinine Ratio 12 10 - 24   Sodium 143 134 - 144 mmol/L   Potassium 4.5 3.5 - 5.2 mmol/L   Chloride 107 (H) 96 - 106 mmol/L   CO2 21 20 - 29 mmol/L   Calcium 9.6 8.6 - 10.2 mg/dL   Total Protein 6.5 6.0 - 8.5 g/dL   Albumin 4.3 3.8 - 4.8 g/dL   Globulin, Total 2.2 1.5 - 4.5 g/dL   Bilirubin Total 0.4 0.0 - 1.2 mg/dL   Alkaline Phosphatase 56 44 - 121 IU/L   AST 12 0 - 40 IU/L   ALT 16 0 - 44 IU/L  CBC with Differential/Platelet   Collection Time: 10/04/23  8:41 AM  Result Value Ref Range   WBC 6.7 3.4 - 10.8 x10E3/uL   RBC 5.24 4.14 - 5.80 x10E6/uL   Hemoglobin 14.8 13.0 - 17.7 g/dL   Hematocrit 42.5 95.6 - 51.0 %   MCV 90 79 - 97 fL   MCH 28.2 26.6 - 33.0 pg   MCHC 31.6 31.5 - 35.7 g/dL   RDW 38.7 56.4 - 33.2 %   Platelets 192 150 - 450 x10E3/uL   Neutrophils 60 Not Estab. %   Lymphs 30 Not Estab. %   Monocytes 7 Not Estab. %   Eos 2 Not Estab. %   Basos 1 Not Estab. %   Neutrophils Absolute 4.0 1.4 - 7.0 x10E3/uL   Lymphocytes Absolute 2.0 0.7 - 3.1 x10E3/uL   Monocytes Absolute 0.5 0.1 - 0.9 x10E3/uL   EOS (ABSOLUTE) 0.2 0.0 - 0.4 x10E3/uL   Basophils Absolute 0.1 0.0 - 0.2 x10E3/uL   Immature Granulocytes 0 Not Estab. %   Immature Grans (Abs) 0.0 0.0 - 0.1 x10E3/uL  Lipid Panel w/o Chol/HDL Ratio   Collection Time: 10/04/23  8:41 AM  Result Value Ref Range   Cholesterol, Total 131 100 - 199 mg/dL   Triglycerides 66 0 - 149 mg/dL   HDL 52 >95 mg/dL   VLDL Cholesterol Cal 14 5 - 40 mg/dL   LDL Chol Calc (NIH) 65 0 - 99 mg/dL  PSA   Collection Time: 10/04/23  8:41 AM  Result Value Ref Range   Prostate  Specific Ag, Serum 2.8 0.0 - 4.0 ng/mL  TSH   Collection Time: 10/04/23  8:41 AM  Result Value Ref Range   TSH 1.680 0.450 - 4.500 uIU/mL  Microalbumin, Urine Waived   Collection Time: 10/04/23  8:41 AM  Result Value Ref Range   Microalb, Ur Waived  150 (H) 0 - 19 mg/L   Creatinine, Urine Waived 100 10 - 300 mg/dL   Microalb/Creat Ratio >300 (H) <30 mg/g  VITAMIN D 25 Hydroxy (Vit-D Deficiency, Fractures)   Collection Time: 10/04/23  8:41 AM  Result Value Ref Range   Vit D, 25-Hydroxy 17.7 (L) 30.0 - 100.0 ng/mL  Magnesium   Collection Time: 10/04/23  8:41 AM  Result Value Ref Range   Magnesium 2.2 1.6 - 2.3 mg/dL  Phosphorus   Collection Time: 10/04/23  8:41 AM  Result Value Ref Range   Phosphorus 3.2 2.8 - 4.1 mg/dL      Assessment & Plan:   Problem List Items Addressed This Visit   None Visit Diagnoses       Cellulitis of great toe of left foot    -  Primary   Will send wound culture and will treat with bactrim. Recheck on Friday to confirm it's getting better. Call with any concerns.   Relevant Orders   Wound culture        Follow up plan: Return Friday.

## 2023-11-29 DIAGNOSIS — N281 Cyst of kidney, acquired: Secondary | ICD-10-CM | POA: Diagnosis not present

## 2023-11-29 DIAGNOSIS — N1832 Chronic kidney disease, stage 3b: Secondary | ICD-10-CM | POA: Diagnosis not present

## 2023-11-29 DIAGNOSIS — N1831 Chronic kidney disease, stage 3a: Secondary | ICD-10-CM | POA: Diagnosis not present

## 2023-11-29 DIAGNOSIS — M1000 Idiopathic gout, unspecified site: Secondary | ICD-10-CM | POA: Diagnosis not present

## 2023-11-29 DIAGNOSIS — I10 Essential (primary) hypertension: Secondary | ICD-10-CM | POA: Diagnosis not present

## 2023-11-29 DIAGNOSIS — R809 Proteinuria, unspecified: Secondary | ICD-10-CM | POA: Diagnosis not present

## 2023-11-30 ENCOUNTER — Ambulatory Visit (INDEPENDENT_AMBULATORY_CARE_PROVIDER_SITE_OTHER): Admitting: Family Medicine

## 2023-11-30 ENCOUNTER — Encounter: Payer: Self-pay | Admitting: Family Medicine

## 2023-11-30 VITALS — BP 132/71 | HR 64 | Temp 98.3°F | Resp 16 | Wt 237.2 lb

## 2023-11-30 DIAGNOSIS — L03032 Cellulitis of left toe: Secondary | ICD-10-CM | POA: Diagnosis not present

## 2023-11-30 LAB — WOUND CULTURE

## 2023-11-30 NOTE — Progress Notes (Signed)
 BP 132/71 (BP Location: Left Arm, Patient Position: Sitting, Cuff Size: Large)   Pulse 64   Temp 98.3 F (36.8 C) (Oral)   Resp 16   Wt 237 lb 3.2 oz (107.6 kg)   SpO2 98%   BMI 37.14 kg/m    Subjective:    Patient ID: Ricky Melton, male    DOB: [birth year], 73 y.o.   MRN: [id number]  HPI: Ricky Melton is a 73 y.o. male  Chief Complaint  Patient presents with   Cellulitis of great toe of left foot   SKIN INFECTION Duration: about a week Location:L great toe History of trauma in area: yes Pain: mild Quality: tender Severity: less than before Redness: no Swelling: no Oozing: yes Pus: yes Fevers: no Nausea/vomiting: no Status: better Treatments attempted:antibiotics  Tetanus: UTD  Relevant past medical, surgical, family and social history reviewed and updated as indicated. Interim medical history since our last visit reviewed. Allergies and medications reviewed and updated.  Review of Systems  Constitutional: Negative.   Respiratory: Negative.    Cardiovascular: Negative.   Musculoskeletal: Negative.   Skin:  Positive for wound. Negative for color change, pallor and rash.  Psychiatric/Behavioral: Negative.      Per HPI unless specifically indicated above     Objective:    BP 132/71 (BP Location: Left Arm, Patient Position: Sitting, Cuff Size: Large)   Pulse 64   Temp 98.3 F (36.8 C) (Oral)   Resp 16   Wt 237 lb 3.2 oz (107.6 kg)   SpO2 98%   BMI 37.14 kg/m   Wt Readings from Last 3 Encounters:  11/30/23 237 lb 3.2 oz (107.6 kg)  11/27/23 239 lb 3.2 oz (108.5 kg)  10/18/23 234 lb 12.8 oz (106.5 kg)    Physical Exam Vitals and nursing note reviewed.  Constitutional:      General: He is not in acute distress.    Appearance: Normal appearance. He is not ill-appearing, toxic-appearing or diaphoretic.  HENT:     Head: Normocephalic and atraumatic.     Right Ear: External ear normal.     Left Ear: External ear normal.     Nose: Nose normal.      Mouth/Throat:     Mouth: Mucous membranes are moist.     Pharynx: Oropharynx is clear.  Eyes:     General: No scleral icterus.       Right eye: No discharge.        Left eye: No discharge.     Extraocular Movements: Extraocular movements intact.     Conjunctiva/sclera: Conjunctivae normal.     Pupils: Pupils are equal, round, and reactive to light.  Cardiovascular:     Rate and Rhythm: Normal rate and regular rhythm.     Pulses: Normal pulses.     Heart sounds: Normal heart sounds. No murmur heard.    No friction rub. No gallop.  Pulmonary:     Effort: Pulmonary effort is normal. No respiratory distress.     Breath sounds: Normal breath sounds. No stridor. No wheezing, rhonchi or rales.  Chest:     Chest wall: No tenderness.  Musculoskeletal:        General: Tenderness (L medial border of his toe continues with pus, no redness or swelling) present. Normal range of motion.     Cervical back: Normal range of motion and neck supple.  Skin:    General: Skin is warm and dry.     Capillary Refill: Capillary refill  takes less than 2 seconds.     Coloration: Skin is not jaundiced or pale.     Findings: No bruising, erythema, lesion or rash.  Neurological:     General: No focal deficit present.     Mental Status: He is alert and oriented to person, place, and time. Mental status is at baseline.  Psychiatric:        Mood and Affect: Mood normal.        Behavior: Behavior normal.        Thought Content: Thought content normal.        Judgment: Judgment normal.     Results for orders placed or performed in visit on 11/27/23  Wound culture   Collection Time: 11/27/23  3:51 PM   Specimen: Wound   TE  Result Value Ref Range   Gram Stain Result Final report    Organism ID, Bacteria Comment    Organism ID, Bacteria Comment    Aerobic Bacterial Culture Final report (A)    Organism ID, Bacteria Comment (A)    Antimicrobial Susceptibility Comment       Assessment & Plan:    Problem List Items Addressed This Visit   None Visit Diagnoses       Cellulitis of great toe of left foot    -  Primary   Improving. Culture sensative. Will finish bactrim and recheck in 4-5 days. Call with any concerns.        Follow up plan: Return for Tues or Wed. Marland Kitchen

## 2023-12-04 ENCOUNTER — Encounter: Payer: Self-pay | Admitting: Family Medicine

## 2023-12-04 ENCOUNTER — Ambulatory Visit (INDEPENDENT_AMBULATORY_CARE_PROVIDER_SITE_OTHER): Admitting: Family Medicine

## 2023-12-04 VITALS — BP 132/65 | HR 65 | Temp 97.9°F | Ht 67.0 in | Wt 240.4 lb

## 2023-12-04 DIAGNOSIS — L03032 Cellulitis of left toe: Secondary | ICD-10-CM | POA: Diagnosis not present

## 2023-12-04 NOTE — Patient Instructions (Signed)
 Soaking Instructions for Ingrown Toenail Surgery 1. Go directly home and limit activity as instructed. 2. Begin soaks tomorrow and soak twice a day for 14 days. Soaks will consist of: A: 2 tablespoons of Epsom salt in 1 gallon of COOL tap water. Do not use HOT water! Hot water will cause more swelling and  subsequently more pain. B: Add a small amount of Povidine Iodine 10% solution or Betadine to the  water just enough to change the color of the water. (approximately 2  teaspoons). C: Soak 20-30 minutes and let air dry for 15 minutes. 3. Apply a small amount of antibiotic ointment, i.e.: Neosporin, Triple Antibiotic, or  Bacitracin, and fresh band aids.  4. If you experience any discomfort, elevate the foot above the level of your hip and  take 2 Extra Strength Tylenol or Advil. Make sure the band aids are not too tight. 5. CAUTION! If you had a permanent procedure, the surgical site may look infected  for the first 2 weeks. This is a normal reaction to the chemical burn that is  created during the procedure. If you have a temporary removal, the area should  look much better in 4 to 5 days. NOTE: At the pharmacy you will need: Epsom Salt Betadine (generic; Povidine Iodine 10%) Antibiotic Cream or ointment Band Aids

## 2023-12-04 NOTE — Progress Notes (Signed)
 BP 132/65   Pulse 65   Temp 97.9 F (36.6 C) (Oral)   Ht 5\' 7"  (1.702 m)   Wt 240 lb 6.4 oz (109 kg)   SpO2 97%   BMI 37.65 kg/m    Subjective:    Patient ID: Ricky Melton, male    DOB: [birth year], 73 y.o.   MRN: [id number]  HPI: Ricky Melton is a 73 y.o. male  Chief Complaint  Patient presents with   Toe Pain   Finished his antibiotics this AM. Has been feeling well. No other concerns.   Relevant past medical, surgical, family and social history reviewed and updated as indicated. Interim medical history since our last visit reviewed. Allergies and medications reviewed and updated.  Review of Systems  Constitutional: Negative.   Respiratory: Negative.    Cardiovascular: Negative.   Musculoskeletal: Negative.   Skin: Negative.   Psychiatric/Behavioral: Negative.      Per HPI unless specifically indicated above     Objective:    BP 132/65   Pulse 65   Temp 97.9 F (36.6 C) (Oral)   Ht 5\' 7"  (1.702 m)   Wt 240 lb 6.4 oz (109 kg)   SpO2 97%   BMI 37.65 kg/m   Wt Readings from Last 3 Encounters:  12/04/23 240 lb 6.4 oz (109 kg)  11/30/23 237 lb 3.2 oz (107.6 kg)  11/27/23 239 lb 3.2 oz (108.5 kg)    Physical Exam Vitals and nursing note reviewed.  Constitutional:      General: He is not in acute distress.    Appearance: Normal appearance. He is not ill-appearing, toxic-appearing or diaphoretic.  HENT:     Head: Normocephalic and atraumatic.     Right Ear: External ear normal.     Left Ear: External ear normal.     Nose: Nose normal.     Mouth/Throat:     Mouth: Mucous membranes are moist.     Pharynx: Oropharynx is clear.  Eyes:     General: No scleral icterus.       Right eye: No discharge.        Left eye: No discharge.     Extraocular Movements: Extraocular movements intact.     Conjunctiva/sclera: Conjunctivae normal.     Pupils: Pupils are equal, round, and reactive to light.  Cardiovascular:     Rate and Rhythm: Normal rate and regular  rhythm.     Pulses: Normal pulses.     Heart sounds: Normal heart sounds. No murmur heard.    No friction rub. No gallop.  Pulmonary:     Effort: Pulmonary effort is normal. No respiratory distress.     Breath sounds: Normal breath sounds. No stridor. No wheezing, rhonchi or rales.  Chest:     Chest wall: No tenderness.  Musculoskeletal:        General: Normal range of motion.     Cervical back: Normal range of motion and neck supple.  Skin:    General: Skin is warm and dry.     Capillary Refill: Capillary refill takes less than 2 seconds.     Coloration: Skin is not jaundiced or pale.     Findings: No bruising, erythema, lesion or rash.     Comments: No more redness or swelling to toe  Neurological:     General: No focal deficit present.     Mental Status: He is alert and oriented to person, place, and time. Mental status is at baseline.  Psychiatric:        Mood and Affect: Mood normal.        Behavior: Behavior normal.        Thought Content: Thought content normal.        Judgment: Judgment normal.     Results for orders placed or performed in visit on 11/27/23  Wound culture   Collection Time: 11/27/23  3:51 PM   Specimen: Wound   TE  Result Value Ref Range   Gram Stain Result Final report    Organism ID, Bacteria Comment    Organism ID, Bacteria Comment    Aerobic Bacterial Culture Final report (A)    Organism ID, Bacteria Comment (A)    Antimicrobial Susceptibility Comment       Assessment & Plan:   Problem List Items Addressed This Visit   None Visit Diagnoses       Cellulitis of great toe of left foot    -  Primary   Resolved. Continue epsom salt baths. Call with any concerns.        Follow up plan: Return end of July for 6 month visit.

## 2024-01-04 ENCOUNTER — Ambulatory Visit: Payer: Medicare Other | Admitting: Cardiovascular Disease

## 2024-01-04 ENCOUNTER — Encounter: Payer: Self-pay | Admitting: Cardiovascular Disease

## 2024-01-04 VITALS — BP 122/70 | HR 76 | Temp 97.4°F | Ht 67.0 in | Wt 231.2 lb

## 2024-01-04 DIAGNOSIS — I25738 Atherosclerosis of nonautologous biological coronary artery bypass graft(s) with other forms of angina pectoris: Secondary | ICD-10-CM | POA: Diagnosis not present

## 2024-01-04 DIAGNOSIS — I5033 Acute on chronic diastolic (congestive) heart failure: Secondary | ICD-10-CM

## 2024-01-04 DIAGNOSIS — E782 Mixed hyperlipidemia: Secondary | ICD-10-CM

## 2024-01-04 DIAGNOSIS — N182 Chronic kidney disease, stage 2 (mild): Secondary | ICD-10-CM

## 2024-01-04 DIAGNOSIS — I10 Essential (primary) hypertension: Secondary | ICD-10-CM

## 2024-01-04 NOTE — Progress Notes (Signed)
 Cardiology Office Note   Date:  01/04/2024   ID:  Ricky Melton, DOB 1951-01-06, MRN [id number]  PCP:  Solomon Dupre, DO  Cardiologist:  Debborah Fairly, MD      History of Present Illness: Ricky Melton is a 73 y.o. male who presents for  Chief Complaint  Patient presents with   Follow-up    3 month follow up    Doing well, no chest pain/SOB.      Past Medical History:  Diagnosis Date   Benign hematuria    CAD (coronary artery disease)    Chronic kidney disease    Hyperlipidemia    Hypertension      Past Surgical History:  Procedure Laterality Date   cardiac stents     x2   COLONOSCOPY WITH PROPOFOL  N/A 05/10/2018   Procedure: COLONOSCOPY WITH PROPOFOL ;  Surgeon: Luke Salaam, MD;  Location: [facility] ENDOSCOPY;  Service: Gastroenterology;  Laterality: N/A;   EYE SURGERY Right 2021   cataract sx in [place]      Current Outpatient Medications  Medication Sig Dispense Refill   allopurinol  (ZYLOPRIM ) 300 MG tablet Take 0.5 tablets (150 mg total) by mouth daily. 100 tablet 1   amLODipine  (NORVASC ) 10 MG tablet Take 1 tablet (10 mg total) by mouth every evening. 100 tablet 1   aspirin  81 MG tablet Take 81 mg by mouth daily.     benazepril  (LOTENSIN ) 40 MG tablet Take 1 tablet (40 mg total) by mouth daily. 100 tablet 1   carvedilol  (COREG ) 25 MG tablet Take 1 tablet (25 mg total) by mouth 2 (two) times daily. 60 tablet 11   clopidogrel  (PLAVIX ) 75 MG tablet Take 1 tablet (75 mg total) by mouth daily. 100 tablet 1   levothyroxine  (SYNTHROID ) 50 MCG tablet Take 1 tablet (50 mcg total) by mouth daily. 100 tablet 2   rosuvastatin  (CRESTOR ) 40 MG tablet Take 1 tablet (40 mg total) by mouth daily. 100 tablet 1   Vitamin D , Ergocalciferol , (DRISDOL ) 1.25 MG (50000 UNIT) CAPS capsule Take 1 capsule (50,000 Units total) by mouth every 7 (seven) days. 12 capsule 1   No current facility-administered medications for this visit.    Allergies:   Hydrochlorothiazide      Social History:   reports that he has never smoked. He quit smokeless tobacco use about 13 years ago.  His smokeless tobacco use included chew. He reports that he does not drink alcohol and does not use drugs.   Family History:  family history includes Diabetes in his sister and son; Hypertension in his mother.    ROS:     Review of Systems  Constitutional: Negative.   HENT: Negative.    Eyes: Negative.   Respiratory: Negative.    Gastrointestinal: Negative.   Genitourinary: Negative.   Musculoskeletal: Negative.   Skin: Negative.   Neurological: Negative.   Endo/Heme/Allergies: Negative.   Psychiatric/Behavioral: Negative.    All other systems reviewed and are negative.     All other systems are reviewed and negative.    PHYSICAL EXAM: VS:  BP 122/70   Pulse 76   Temp (!) 97.4 F (36.3 C)   Ht 5\' 7"  (1.702 m)   Wt 231 lb 3.2 oz (104.9 kg)   SpO2 96%   BMI 36.21 kg/m  , BMI Body mass index is 36.21 kg/m. Last weight:  Wt Readings from Last 3 Encounters:  01/04/24 231 lb 3.2 oz (104.9 kg)  12/04/23 240 lb 6.4 oz (109  kg)  11/30/23 237 lb 3.2 oz (107.6 kg)     Physical Exam Vitals reviewed.  Constitutional:      Appearance: Normal appearance. He is normal weight.  HENT:     Head: Normocephalic.     Nose: Nose normal.     Mouth/Throat:     Mouth: Mucous membranes are moist.  Eyes:     Pupils: Pupils are equal, round, and reactive to light.  Cardiovascular:     Rate and Rhythm: Normal rate and regular rhythm.     Pulses: Normal pulses.     Heart sounds: Normal heart sounds.  Pulmonary:     Effort: Pulmonary effort is normal.  Abdominal:     General: Abdomen is flat. Bowel sounds are normal.  Musculoskeletal:        General: Normal range of motion.     Cervical back: Normal range of motion.  Skin:    General: Skin is warm.  Neurological:     General: No focal deficit present.     Mental Status: He is alert.  Psychiatric:        Mood and Affect:  Mood normal.       EKG:   Recent Labs: 10/04/2023: ALT 16; BUN 16; Creatinine, Ser 1.38; Hemoglobin 14.8; Magnesium 2.2; Platelets 192; Potassium 4.5; Sodium 143; TSH 1.680    Lipid Panel    Component Value Date/Time   CHOL 131 10/04/2023 0841   CHOL 98 05/14/2019 1300   TRIG 66 10/04/2023 0841   TRIG 205 (H) 05/14/2019 1300   HDL 52 10/04/2023 0841   CHOLHDL 2.9 05/24/2021 0823   VLDL 41 (H) 05/14/2019 1300   LDLCALC 65 10/04/2023 0841      Other studies Reviewed: Additional studies/ records that were reviewed today include:  Review of the above records demonstrates:       No data to display            ASSESSMENT AND PLAN:    ICD-10-CM   1. CHF (congestive heart failure), NYHA class III, acute on chronic, diastolic (HCC)  I50.33    Has swelling of right lower leg, but unilateral due to Knee pain. No SOB.    2. Essential hypertension  I10     3. 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 CKD (chronic kidney disease), stage II  N18.2     5. Mixed hyperlipidemia  E78.2     6. Morbid obesity (HCC)  E66.01        Problem List Items Addressed This Visit       Cardiovascular and Mediastinum   CAD (coronary artery disease)   Essential hypertension     Genitourinary   CKD (chronic kidney disease), stage II     Other   Hyperlipidemia   Morbid obesity (HCC)   Other Visit Diagnoses       CHF (congestive heart failure), NYHA class III, acute on chronic, diastolic (HCC)    -  Primary   Has swelling of right lower leg, but unilateral due to Knee pain. No SOB.          Disposition:   Return in about 3 months (around 04/04/2024).    Total time spent: 35 minutes  Signed,  Debborah Fairly, MD  01/04/2024 9:28 AM    [facility]

## 2024-01-17 ENCOUNTER — Ambulatory Visit: Payer: Self-pay

## 2024-01-17 VITALS — Ht 67.0 in | Wt 228.0 lb

## 2024-01-17 DIAGNOSIS — Z0000 Encounter for general adult medical examination without abnormal findings: Secondary | ICD-10-CM | POA: Diagnosis not present

## 2024-01-17 NOTE — Progress Notes (Signed)
 Subjective:   Ricky Melton is a 73 y.o. who presents for a Medicare Wellness preventive visit.  Visit Complete: Virtual I connected with  Ricky Melton on 01/17/24 by a audio enabled telemedicine application and verified that I am speaking with the correct person using two identifiers.  Patient Location: Home  Provider Location: Home Office  I discussed the limitations of evaluation and management by telemedicine. The patient expressed understanding and agreed to proceed.  Vital Signs: Because this visit was a virtual/telehealth visit, some criteria may be missing or patient reported. Any vitals not documented were not able to be obtained and vitals that have been documented are patient reported.  VideoDeclined- This patient declined Librarian, academic. Therefore the visit was completed with audio only.  Persons Participating in Visit: Patient.  AWV Questionnaire: No: Patient Medicare AWV questionnaire was not completed prior to this visit.  Cardiac Risk Factors include: advanced age (>5men, >81 women);male gender;dyslipidemia;hypertension;obesity (BMI >30kg/m2);Other (see comment), Risk factor comments: CAD, prediabetic     Objective:    Today's Vitals   01/17/24 1122  Weight: 228 lb (103.4 kg)  Height: 5\' 7"  (1.702 m)  PainSc: 4    Body mass index is 35.71 kg/m.     01/17/2024   11:36 AM 03/26/2023    2:17 PM 03/25/2023   12:33 PM 01/08/2023    1:38 PM 01/05/2022   12:03 PM 01/03/2021    1:56 PM 06/26/2020    1:22 PM  Advanced Directives  Does Patient Have a Medical Advance Directive? No  No No No No No  Would patient like information on creating a medical advance directive? Yes (MAU/Ambulatory/Procedural Areas - Information given) No - Patient declined  No - Patient declined No - Patient declined      Current Medications (verified) Outpatient Encounter Medications as of 01/17/2024  Medication Sig   acetaminophen  (TYLENOL ) 500 MG tablet  Take 1,000 mg by mouth every 6 (six) hours as needed.   allopurinol  (ZYLOPRIM ) 300 MG tablet Take 0.5 tablets (150 mg total) by mouth daily.   amLODipine  (NORVASC ) 10 MG tablet Take 1 tablet (10 mg total) by mouth every evening.   aspirin  81 MG tablet Take 81 mg by mouth daily.   benazepril  (LOTENSIN ) 40 MG tablet Take 1 tablet (40 mg total) by mouth daily.   carvedilol  (COREG ) 25 MG tablet Take 1 tablet (25 mg total) by mouth 2 (two) times daily.   clopidogrel  (PLAVIX ) 75 MG tablet Take 1 tablet (75 mg total) by mouth daily.   levothyroxine  (SYNTHROID ) 50 MCG tablet Take 1 tablet (50 mcg total) by mouth daily.   rosuvastatin  (CRESTOR ) 40 MG tablet Take 1 tablet (40 mg total) by mouth daily.   Vitamin D , Ergocalciferol , (DRISDOL ) 1.25 MG (50000 UNIT) CAPS capsule Take 1 capsule (50,000 Units total) by mouth every 7 (seven) days.   No facility-administered encounter medications on file as of 01/17/2024.    Allergies (verified) Hydrochlorothiazide    History: Past Medical History:  Diagnosis Date   Benign hematuria    CAD (coronary artery disease)    Chronic kidney disease    Hyperlipidemia    Hypertension    Past Surgical History:  Procedure Laterality Date   cardiac stents     x2   COLONOSCOPY WITH PROPOFOL  N/A 05/10/2018   Procedure: COLONOSCOPY WITH PROPOFOL ;  Surgeon: Luke Salaam, MD;  Location: [facility] ENDOSCOPY;  Service: Gastroenterology;  Laterality: N/A;   EYE SURGERY Right 2021   cataract sx  in [place]    Family History  Problem Relation Age of Onset   Hypertension Mother    Other Mother 55       "old age"   Other Father 68       shot by someone   Diabetes Sister    Diabetes Son    Social History   Socioeconomic History   Marital status: Married    Spouse name: Dianne   Number of children: 2   Years of education: Not on file   Highest education level: High school graduate  Occupational History   Occupation: maintenence     Comment: part time   Tobacco Use    Smoking status: Never    Passive exposure: Past   Smokeless tobacco: Former    Types: Chew    Quit date: 2012  Vaping Use   Vaping status: Never Used  Substance and Sexual Activity   Alcohol use: No    Alcohol/week: 0.0 standard drinks of alcohol   Drug use: No   Sexual activity: Yes  Other Topics Concern   Not on file  Social History Narrative   Plays golf with friends, mows yards    01/17/24 working part time ~18 hours/week   Social Drivers of Corporate investment banker Strain: Low Risk  (01/17/2024)   Overall Financial Resource Strain (CARDIA)    Difficulty of Paying Living Expenses: Not hard at all  Food Insecurity: No Food Insecurity (01/17/2024)   Hunger Vital Sign    Worried About Running Out of Food in the Last Year: Never true    Ran Out of Food in the Last Year: Never true  Transportation Needs: No Transportation Needs (01/17/2024)   PRAPARE - Administrator, Civil Service (Medical): No    Lack of Transportation (Non-Medical): No  Physical Activity: Inactive (01/17/2024)   Exercise Vital Sign    Days of Exercise per Week: 0 days    Minutes of Exercise per Session: 0 min  Stress: No Stress Concern Present (01/17/2024)   [employer] of Occupational Health - Occupational Stress Questionnaire    Feeling of Stress : Not at all  Social Connections: Socially Integrated (01/17/2024)   Social Connection and Isolation Panel [NHANES]    Frequency of Communication with Friends and Family: More than three times a week    Frequency of Social Gatherings with Friends and Family: More than three times a week    Attends Religious Services: More than 4 times per year    Active Member of [employer] or Organizations: Yes    Attends Engineer, structural: More than 4 times per year    Marital Status: Married    Tobacco Counseling Counseling given: No    Clinical Intake:  Pre-visit preparation completed: Yes  Pain : 0-10 Pain Score: 4  Pain Type: Chronic  pain Pain Location: Knee Pain Orientation: Right, Left Pain Descriptors / Indicators: Aching     BMI - recorded: 35.71 Nutritional Status: BMI > 30  Obese Nutritional Risks: None Diabetes: No  Lab Results  Component Value Date   HGBA1C 6.0 (H) 04/06/2023   HGBA1C 6.0 (H) 02/19/2023   HGBA1C 5.7 (H) 07/13/2022     How often do you need to have someone help you when you read instructions, pamphlets, or other written materials from your doctor or pharmacy?: 1 - Never  Interpreter Needed?: No  Information entered by :: Jaunita Messier, CMA   Activities of Daily Living  01/17/2024   11:24 AM 03/26/2023    2:14 PM  In your present state of health, do you have any difficulty performing the following activities:  Hearing? 0 0  Vision? 0 0  Difficulty concentrating or making decisions? 0 0  Walking or climbing stairs? 1 0  Comment uses cane prn due to chronic knee pain   Dressing or bathing? 0 0  Doing errands, shopping? 0 0  Preparing Food and eating ? N   Using the Toilet? N   In the past six months, have you accidently leaked urine? N   Do you have problems with loss of bowel control? N   Managing your Medications? N   Managing your Finances? N   Housekeeping or managing your Housekeeping? N     Patient Care Team: Solomon Dupre, DO as PCP - General (Family Medicine) Lucendia Rusk, DO (Optometry) Ardeth Krabbe, MD as Consulting Physician (Ophthalmology) Worthy Heads, MD as Consulting Physician (Nephrology) Amos Balint, [place] (Orthopedic Surgery) Cherrie Cornwall, MD as Consulting Physician (Cardiology)  Indicate any recent Medical Services you may have received from other than [facility] providers in the past year (date may be approximate).     Assessment:    This is a routine wellness examination for [facility].  Hearing/Vision screen Hearing Screening - Comments:: Denies hearing loss Vision Screening - Comments:: Gets routine eye exams, Dr. Lucendia Rusk, [place]   Goals Addressed             This Visit's Progress    Patient Stated       Stay healthy       Depression Screen     01/17/2024   11:34 AM 11/27/2023    3:42 PM 10/18/2023   10:23 AM 10/04/2023    8:16 AM 05/31/2023    9:41 AM 04/06/2023    1:13 PM 02/22/2023    3:51 PM  PHQ 2/9 Scores  PHQ - 2 Score 0 0 0 0 0 0 0  PHQ- 9 Score 0 0 0 0 0 0 0    Fall Risk     01/17/2024   11:40 AM 11/27/2023    3:42 PM 10/18/2023   10:23 AM 10/04/2023    8:16 AM 05/31/2023    9:41 AM  Fall Risk   Falls in the past year? 0 0 0 0 0  Number falls in past yr: 0 0 0 0 0  Injury with Fall? 0 0 0 0 0  Risk for fall due to : No Fall Risks No Fall Risks No Fall Risks No Fall Risks No Fall Risks  Follow up Falls prevention discussed;Falls evaluation completed Falls evaluation completed Falls evaluation completed Falls evaluation completed Falls evaluation completed    MEDICARE RISK AT HOME:  Medicare Risk at Home Any stairs in or around the home?: Yes If so, are there any without handrails?: No Home free of loose throw rugs in walkways, pet beds, electrical cords, etc?: Yes Adequate lighting in your home to reduce risk of falls?: Yes Life alert?: No Use of a cane, walker or w/c?: Yes (uses cane prn) Grab bars in the bathroom?: No Shower chair or bench in shower?: No Elevated toilet seat or a handicapped toilet?: Yes  TIMED UP AND GO:  Was the test performed?  No  Cognitive Function: 6CIT completed        01/17/2024   11:42 AM 01/08/2023    1:42 PM 01/05/2022   12:02 PM 01/03/2021    2:00 PM  11/19/2019   10:39 AM  6CIT Screen  What Year? 0 points 0 points 0 points 0 points 0 points  What month? 0 points 0 points 0 points 0 points 0 points  What time? 0 points 0 points 0 points 0 points 0 points  Count back from 20 0 points 0 points 0 points 0 points 0 points  Months in reverse 0 points 0 points 0 points 0 points 0 points  Repeat phrase 2 points 0 points 4 points 8 points 0 points  Total  Score 2 points 0 points 4 points 8 points 0 points    Immunizations Immunization History  Administered Date(s) Administered   Fluad Quad(high Dose 65+) 05/21/2019, 06/07/2020, 07/07/2021, 07/13/2022   Fluad Trivalent(High Dose 65+) 05/31/2023   Influenza, High Dose Seasonal PF 06/27/2018   Influenza,inj,Quad PF,6+ Mos 10/14/2015   MMR 04/30/2000   Moderna Covid-19 Fall Seasonal Vaccine 78yrs & older 07/06/2023   Moderna Sars-Covid-2 Vaccination 11/08/2019, 12/06/2019, 09/06/2020, 12/08/2020   Pneumococcal Conjugate-13 04/13/2016   Pneumococcal Polysaccharide-23 06/07/2006, 10/25/2017   Td 01/23/2006   Tdap 04/13/2016   Zoster Recombinant(Shingrix) 07/14/2021   Zoster, Live 08/21/2011    Screening Tests Health Maintenance  Topic Date Due   Zoster Vaccines- Shingrix (2 of 2) 09/08/2021   COVID-19 Vaccine (6 - 2024-25 season) 01/04/2024   INFLUENZA VACCINE  04/11/2024   Medicare Annual Wellness (AWV)  01/16/2025   DTaP/Tdap/Td (3 - Td or Tdap) 04/13/2026   Colonoscopy  05/10/2028   Pneumonia Vaccine 48+ Years old  Completed   Hepatitis C Screening  Completed   HPV VACCINES  Aged Out   Meningococcal B Vaccine  Aged Out    Health Maintenance  Health Maintenance Due  Topic Date Due   Zoster Vaccines- Shingrix (2 of 2) 09/08/2021   COVID-19 Vaccine (6 - 2024-25 season) 01/04/2024   Health Maintenance Items Addressed: See Nurse Notes  Additional Screening:  Vision Screening: Recommended annual ophthalmology exams for early detection of glaucoma and other disorders of the eye.  Dental Screening: Recommended annual dental exams for proper oral hygiene  Community Resource Referral / Chronic Care Management: CRR required this visit?  No   CCM required this visit?  No     Plan:     I have personally reviewed and noted the following in the patient's chart:   Medical and social history Use of alcohol, tobacco or illicit drugs  Current medications and supplements  including opioid prescriptions. Patient is not currently taking opioid prescriptions. Functional ability and status Nutritional status Physical activity Advanced directives List of other physicians Hospitalizations, surgeries, and ER visits in previous 12 months Vitals Screenings to include cognitive, depression, and falls Referrals and appointments  In addition, I have reviewed and discussed with patient certain preventive protocols, quality metrics, and best practice recommendations. A written personalized care plan for preventive services as well as general preventive health recommendations were provided to patient.     Jie Mccoin, CMA   01/17/2024   After Visit Summary: (Mail) Due to this being a telephonic visit, the after visit summary with patients personalized plan was offered to patient via mail   Notes: Please refer to Routing Comments.

## 2024-01-17 NOTE — Patient Instructions (Addendum)
 [name] , Thank you for taking time to come for your Medicare Wellness Visit. I appreciate your ongoing commitment to your health goals. Please review the following plan we discussed and let me know if I can assist you in the future.   Referrals/Orders/Follow-Ups/Clinician Recommendations: Get the 2nd Shringrix (shingles) vaccine at your earliest convenience. Keep up the good work!  This is a list of the screening recommended for you and due dates:  Health Maintenance  Topic Date Due   Zoster (Shingles) Vaccine (2 of 2) 09/08/2021   COVID-19 Vaccine (6 - 2024-25 season) 01/04/2024   Flu Shot  04/11/2024   Medicare Annual Wellness Visit  01/16/2025   DTaP/Tdap/Td vaccine (3 - Td or Tdap) 04/13/2026   Colon Cancer Screening  05/10/2028   Pneumonia Vaccine  Completed   Hepatitis C Screening  Completed   HPV Vaccine  Aged Out   Meningitis B Vaccine  Aged Out    Advanced directives: (Provided) Advance directive discussed with you today. I have provided a copy for you to complete at home and have notarized. Once this is complete, please bring a copy in to our office so we can scan it into your chart.   Next Medicare Annual Wellness Visit scheduled for next year: Yes, 01/29/25 @ 10:40am (phone visit)  Have you seen your provider in the last 6 months (3 months if uncontrolled diabetes)? Yes, next OV 04/10/24 with Dr. Lincoln Renshaw

## 2024-02-12 DIAGNOSIS — H2512 Age-related nuclear cataract, left eye: Secondary | ICD-10-CM | POA: Diagnosis not present

## 2024-02-12 DIAGNOSIS — H1713 Central corneal opacity, bilateral: Secondary | ICD-10-CM | POA: Diagnosis not present

## 2024-02-12 DIAGNOSIS — Z961 Presence of intraocular lens: Secondary | ICD-10-CM | POA: Diagnosis not present

## 2024-03-10 ENCOUNTER — Encounter: Payer: Self-pay | Admitting: Pediatrics

## 2024-03-10 ENCOUNTER — Ambulatory Visit: Payer: Self-pay | Admitting: Pediatrics

## 2024-03-10 ENCOUNTER — Ambulatory Visit (INDEPENDENT_AMBULATORY_CARE_PROVIDER_SITE_OTHER): Admitting: Pediatrics

## 2024-03-10 VITALS — BP 137/71 | HR 71 | Temp 97.9°F | Wt 235.2 lb

## 2024-03-10 DIAGNOSIS — J069 Acute upper respiratory infection, unspecified: Secondary | ICD-10-CM

## 2024-03-10 LAB — POC COVID19/FLU A&B COMBO
Covid Antigen, POC: NEGATIVE
Influenza A Antigen, POC: NEGATIVE
Influenza B Antigen, POC: NEGATIVE

## 2024-03-10 MED ORDER — AZITHROMYCIN 250 MG PO TABS: ORAL_TABLET | ORAL | 0 refills | Status: AC

## 2024-03-10 MED ORDER — METHYLPREDNISOLONE 4 MG PO TBPK: ORAL_TABLET | ORAL | 0 refills | Status: DC

## 2024-03-10 NOTE — Progress Notes (Signed)
 Office Visit  BP 137/71   Pulse 71   Temp 97.9 F (36.6 C) (Oral)   Wt 235 lb 3.2 oz (106.7 kg)   SpO2 97%   BMI 36.84 kg/m    Subjective:    Patient ID: Ricky Melton, male    DOB: [birth year], 73 y.o.   MRN: [id number]  HPI: Ricky Melton is a 73 y.o. male  Chief Complaint  Patient presents with   URI    Light headed , sore throat and cough yellow mucus comes up     Discussed the use of AI scribe software for clinical note transcription with the patient, who gave verbal consent to proceed.  History of Present Illness   Ricky Melton is a 73 year old male who presents with a sore throat and cough.  Symptoms began on Thursday with a sore throat, which remains the primary concern. Coughing exacerbates the soreness, and he occasionally experiences wheezing, particularly when lying in bed. He denies frequent coughing but notes that when he does cough, sputum is produced.  He experiences intermittent fevers, describing them as feeling 'a little hot' at times. No nausea or vomiting. He reports sinus congestion and pressure, which sometimes makes him feel lightheaded and dizzy. He has been staying hydrated and has not noticed anyone else around him being sick.  He has a history of bronchitis but does not feel that his current symptoms are similar to previous bronchitis exacerbations. He does not currently use an inhaler but has used one in the past. He recalls being outside mowing the lawn and then sitting in his truck with the air conditioning on, which he believes may have contributed to his symptoms.  He reports feeling more tired and run down than usual. He is currently on day four of his symptoms.      Relevant past medical, surgical, family and social history reviewed and updated as indicated. Interim medical history since our last visit reviewed. Allergies and medications reviewed and updated.  ROS per HPI unless specifically indicated above     Objective:    BP  137/71   Pulse 71   Temp 97.9 F (36.6 C) (Oral)   Wt 235 lb 3.2 oz (106.7 kg)   SpO2 97%   BMI 36.84 kg/m   Wt Readings from Last 3 Encounters:  03/10/24 235 lb 3.2 oz (106.7 kg)  01/17/24 228 lb (103.4 kg)  01/04/24 231 lb 3.2 oz (104.9 kg)     Physical Exam Constitutional:      Appearance: Normal appearance.   Cardiovascular:     Rate and Rhythm: Normal rate and regular rhythm.     Pulses: Normal pulses.     Heart sounds: Normal heart sounds.  Pulmonary:     Effort: Pulmonary effort is normal. No respiratory distress.     Breath sounds: Normal breath sounds. No wheezing, rhonchi or rales.   Musculoskeletal:        General: Normal range of motion.   Skin:    Comments: Normal skin color   Neurological:     General: No focal deficit present.     Mental Status: He is alert. Mental status is at baseline.   Psychiatric:        Mood and Affect: Mood normal.        Behavior: Behavior normal.        Thought Content: Thought content normal.         01/17/2024   11:34 AM 11/27/2023  3:42 PM 10/18/2023   10:23 AM 10/04/2023    8:16 AM 05/31/2023    9:41 AM  Depression screen PHQ 2/9  Decreased Interest 0 0 0 0 0  Down, Depressed, Hopeless 0 0 0 0 0  PHQ - 2 Score 0 0 0 0 0  Altered sleeping 0 0 0 0 0  Tired, decreased energy 0 0 0 0 0  Change in appetite 0 0 0 0 0  Feeling bad or failure about yourself  0 0 0 0 0  Trouble concentrating 0 0 0 0 0  Moving slowly or fidgety/restless 0 0 0 0 0  Suicidal thoughts 0 0 0 0 0  PHQ-9 Score 0 0 0 0 0  Difficult doing work/chores Not difficult at all Not difficult at all Not difficult at all Not difficult at all Not difficult at all       11/27/2023    3:42 PM 10/18/2023   10:23 AM 10/04/2023    8:17 AM 05/31/2023    9:41 AM  GAD 7 : Generalized Anxiety Score  Nervous, Anxious, on Edge 0 0 0 0  Control/stop worrying 0 0 0 0  Worry too much - different things 0 0 0 0  Trouble relaxing 0 0 0 0  Restless 0 0 0 0  Easily  annoyed or irritable 0 0 0 0  Afraid - awful might happen 0 0 0 0  Total GAD 7 Score 0 0 0 0  Anxiety Difficulty Not difficult at all Not difficult at all Not difficult at all Not difficult at all       Assessment & Plan:  Assessment & Plan   Upper respiratory tract infection, unspecified type Symptoms suggest viral or atypical infection. Symptoms not typical of bronchitis exacerbation. Wheezing present subjectively but not on exam. Negative COVID, flu, and strep. Azithromycin  considered for its anti-inflammatory and atypical infection treatment potential. He is concerned this won't be enough based on past infections, given medrol  pack to start w zpack in 2 days if not feeling better. Return precautions reviewed. - Await results of COVID, flu, and strep tests. - Consider azithromycin  if tests are negative for COVID, flu, and strep. - Encouraged increased fluid intake. -     Rapid Strep Screen ([facility] ONLY) -     POC Covid19/Flu A&B Antigen -     Azithromycin ; Take 2 tablets on day 1, then 1 tablet daily on days 2 through 5  Dispense: 6 tablet; Refill: 0 -     methylPREDNISolone ; Follow instructions  Dispense: 21 each; Refill: 0   Follow up plan: Return if symptoms worsen or fail to improve.  Hadassah SHAUNNA Nett, MD

## 2024-03-10 NOTE — Patient Instructions (Addendum)
 Z pack (azithromycin ) - likely have an atypical infection ---this is the one to take today 6/30  Medrol  - take in 2 days this is the steroid-- take if not better in 2 days   Most cold symptoms last up to 2 weeks, but cough can sometimes linger up to 4 weeks.  However if your symtpoms get WORSE - like you develop fevers or get more shortness of breath, then call your clinic as you may need to be evaluated.   Sore Throat:  See Aches and Pains meds above, also Sore throat sprays and lozenges may also help.   Cough:  Honey 2 TBS every 4-6 hours if needed.  Robitussin DM syrup or generic equivalent which has (guaifenesin  = an expectorant to help you get stuff up + dextromethorphan (DM) = cough supressant). You can also get this in tablet formula (like Mucinex  DM or generic equivalent).  If you have asthma or are wheezing and have a tight chest, then albuterol inhaler (Ventolin, ProAir) may be helpful - you need a prescription for this.   Congestion:  oxymetazoline (Afrin) nasal stray: 2 sprays each nostril every 12 hours. Don't use more than 3 days in a row to avoid building a tolerance to it.  Sinus rinse (neti pot) high volume sinus rinse can help open up your sinuses and be helpful, especially if you're having sinus pressure and headaches.   Other:  Umcka (pelargonium sidoides extract) can to shorten cold symptoms (can be hard to find, but [employer] carries it: brand name Umcka ColdCare from [employer]). Works best if you start taking at earliest signs of cold symptoms.  Andrographis paniculata is another herbal remedy with less evidence, but may reduce common cold symptoms in adults.  zinc acetate lozenges >= 80 mg/day reduces duration but not severity of cold symptoms in adults, but it is associated with bad taste and nausea Heated humidified air may reduce cold symptoms, so try using a humidifier - especially in your bedroom at night.  Stay hydrated! Aim to drink at least 2 liters of  water daily.   What doesn't work (but lots of folks think might) Vitamin C: bummer right?! But there's no evidence that high dose vitamin C will help cold symptoms.

## 2024-03-13 LAB — CULTURE, GROUP A STREP

## 2024-03-13 LAB — RAPID STREP SCREEN (MED CTR MEBANE ONLY): Strep Gp A Ag, IA W/Reflex: NEGATIVE

## 2024-03-13 IMAGING — CR DG KNEE COMPLETE 4+V*L*
1 series · 4 of 4 positions shown · non-contrast
Comparison: X-ray 03/08/2010.

CLINICAL DATA: Left medial knee pain x 1 month.

EXAM:
LEFT KNEE - COMPLETE 4+ VIEW

[Series 1: dg knee complete 4 views left · 0.14mm/px · 4 of 4 slices shown]
[im 1/4]
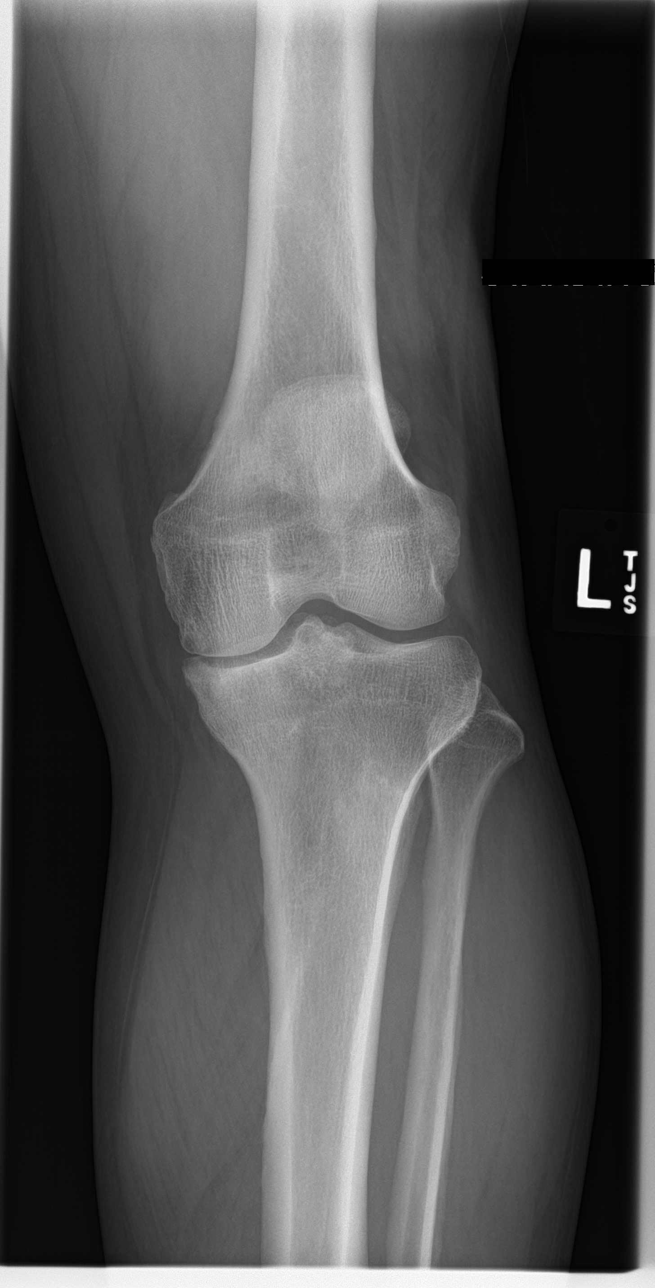
[im 2/4]
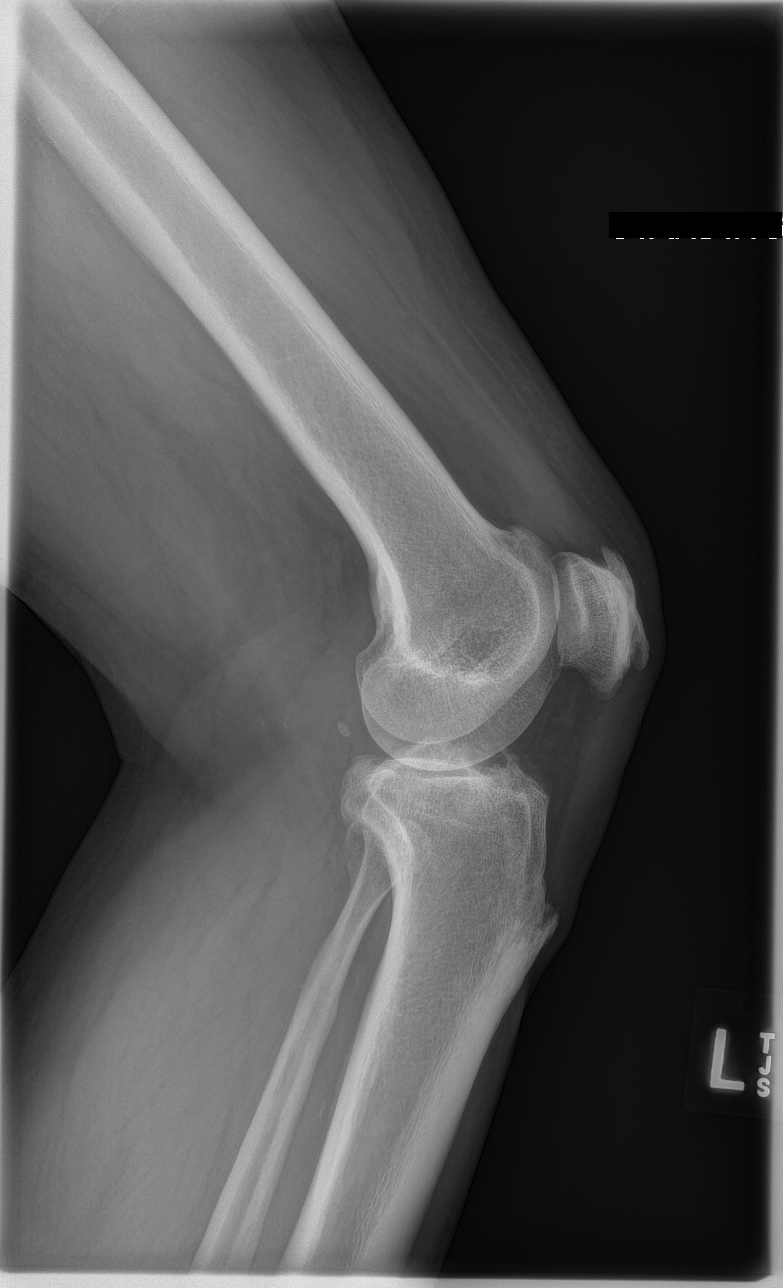
[im 3/4]
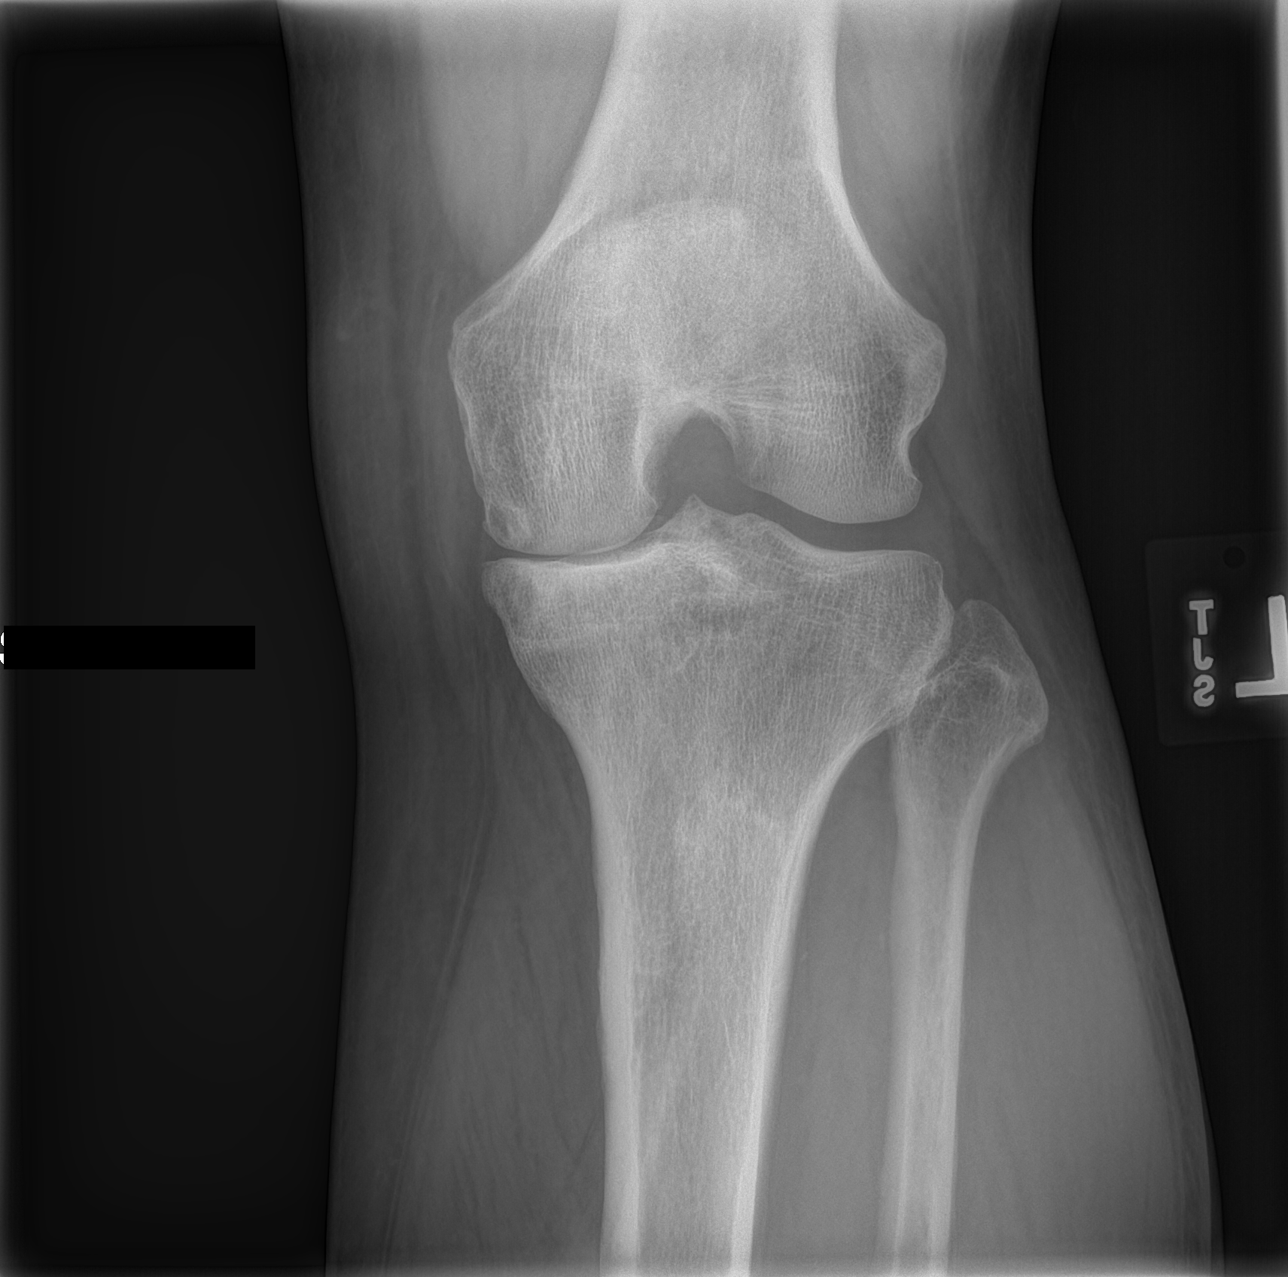
[im 4/4]
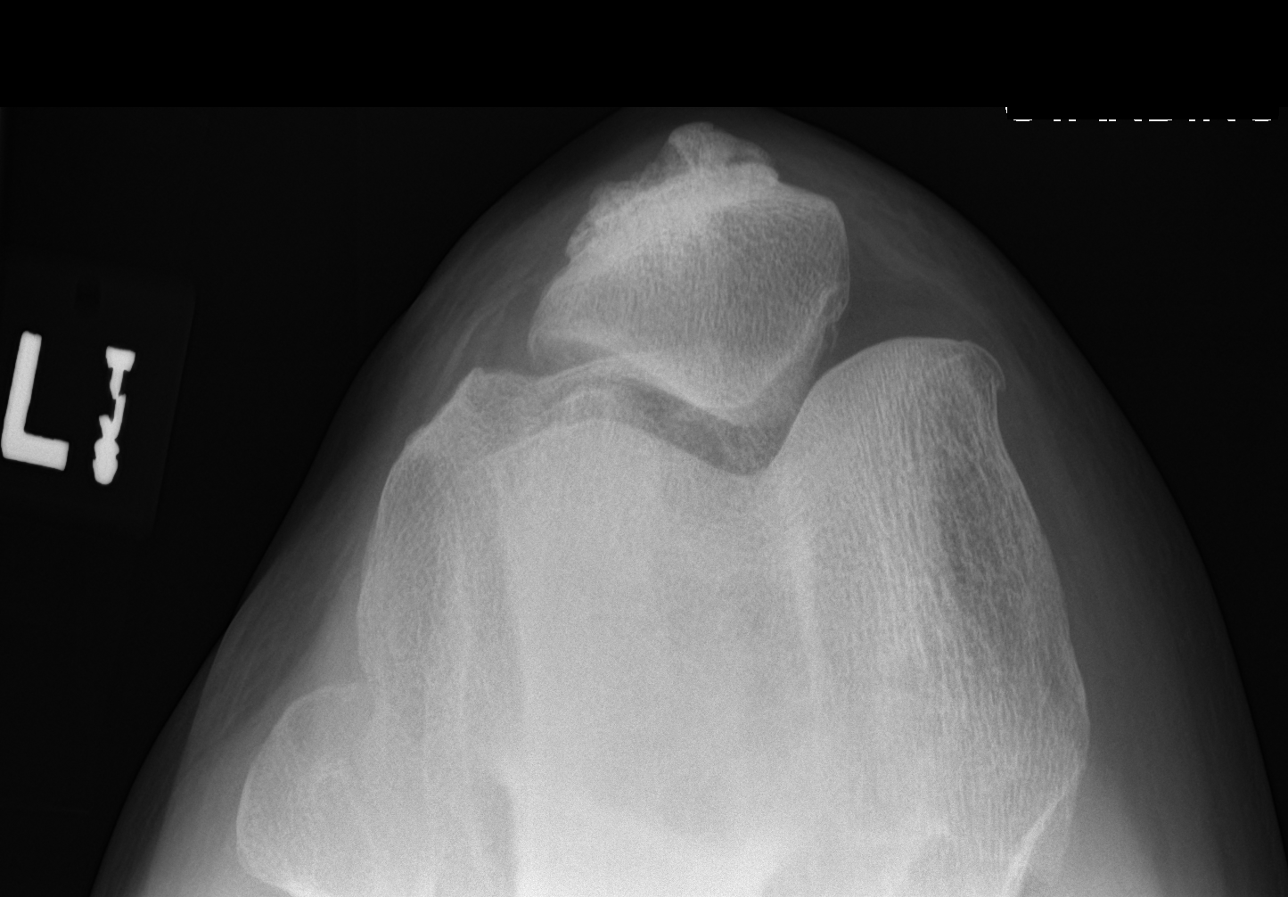

[4 of 4 positions shown; findings below may reference images not displayed]

FINDINGS: No acute fracture or dislocation. Moderate to severe degenerative
changes of the medial compartment with joint space narrowing,
osteophyte formation and subchondral cyst formation. Mild
degenerative changes of the patellofemoral compartment.
Enthesopathic changes of the quadriceps and patellar tendon
insertion on the patella. No area of erosion or osseous destruction.
No unexpected radiopaque foreign body. Soft tissues are
unremarkable.
IMPRESSION: No acute fracture or dislocation.

## 2024-03-19 ENCOUNTER — Other Ambulatory Visit: Payer: Self-pay | Admitting: Family Medicine

## 2024-03-21 NOTE — Telephone Encounter (Signed)
 Requested medication (s) are due for refill today: yes  Requested medication (s) are on the active medication list: yes  Last refill:  10/07/23 #12 1 RF  Future visit scheduled: yes  Notes to clinic:  med not delegated to NT to reorder    Requested Prescriptions  Pending Prescriptions Disp Refills   Vitamin D , Ergocalciferol , (DRISDOL ) 1.25 MG (50000 UNIT) CAPS capsule [Pharmacy Med Name: VITAMIN D2 1.25MG (50,000 UNIT)] 12 capsule 1    Sig: Take 1 capsule (50,000 Units total) by mouth every 7 (seven) days.     Endocrinology:  Vitamins - Vitamin D  Supplementation 2 Failed - 03/21/2024  7:36 AM      Failed - Manual Review: Route requests for 50,000 IU strength to the provider      Failed - Vitamin D  in normal range and within 360 days    Vit D, 25-Hydroxy  Date Value Ref Range Status  10/04/2023 17.7 (L) 30.0 - 100.0 ng/mL Final    Comment:    Vitamin D  deficiency has been defined by the Institute of Medicine and an Endocrine Society practice guideline as a level of serum 25-OH vitamin D  less than 20 ng/mL (1,2). The Endocrine Society went on to further define vitamin D  insufficiency as a level between 21 and 29 ng/mL (2). 1. IOM (Institute of Medicine). 2010. Dietary reference    intakes for calcium  and D. [place]: The    [employer]. 2. Holick MF, Binkley [place], Bischoff-Ferrari HA, et al.    Evaluation, treatment, and prevention of vitamin D     deficiency: an Endocrine Society clinical practice    guideline. JCEM. 2011 Jul; 96(7):1911-30.          Passed - Ca in normal range and within 360 days    Calcium   Date Value Ref Range Status  10/04/2023 9.6 8.6 - 10.2 mg/dL Final         Passed - Valid encounter within last 12 months    Recent Outpatient Visits           1 week ago Upper respiratory tract infection, unspecified type   [place] [facility] [place] Hadassah SQUIBB, MD   3 months ago Cellulitis of great toe of left foot   [place]  [facility] [place], Megan P, DO   3 months ago Cellulitis of great toe of left foot   [place] [facility] [place], Megan P, DO   3 months ago Cellulitis of great toe of left foot   [facility] [place], Megan P, DO   5 months ago Ingrown left greater toenail   [place] [facility] Vicci Duwaine SQUIBB, DO       Future Appointments             In 6 days Fernand Denyse LABOR, MD [facility]

## 2024-03-25 ENCOUNTER — Other Ambulatory Visit: Payer: Self-pay | Admitting: Family Medicine

## 2024-03-25 NOTE — Telephone Encounter (Unsigned)
 Copied from CRM [id number]. Topic: Clinical - Medication Refill >> Mar 25, 2024  8:35 AM Larissa S wrote: Medication: Vitamin D , Ergocalciferol , (DRISDOL ) 1.25 MG (50000 UNIT) CAPS capsule  Has the patient contacted their pharmacy? No (Agent: If no, request that the patient contact the pharmacy for the refill. If patient does not wish to contact the pharmacy document the reason why and proceed with request.) (Agent: If yes, when and what did the pharmacy advise?)  This is the patient's preferred pharmacy:  [facility] [address] [place] - [address] [place] [address] Phone: [phone] Fax: [phone]    Is this the correct pharmacy for this prescription? Yes If no, delete pharmacy and type the correct one.   Has the prescription been filled recently? No  Is the patient out of the medication? Yes  Has the patient been seen for an appointment in the last year OR does the patient have an upcoming appointment? Yes  Can we respond through MyChart? No  Agent: Please be advised that Rx refills may take up to 3 business days. We ask that you follow-up with your pharmacy.

## 2024-03-26 NOTE — Telephone Encounter (Signed)
 Refused Yesterday (03/25/2024):  Change not appropriate (will recheck labs at end of the month)   Requested Prescriptions  Pending Prescriptions Disp Refills   Vitamin D , Ergocalciferol , (DRISDOL ) 1.25 MG (50000 UNIT) CAPS capsule 12 capsule 1    Sig: Take 1 capsule (50,000 Units total) by mouth every 7 (seven) days.     Endocrinology:  Vitamins - Vitamin D  Supplementation 2 Failed - 03/26/2024  1:24 PM      Failed - Manual Review: Route requests for 50,000 IU strength to the provider      Failed - Vitamin D  in normal range and within 360 days    Vit D, 25-Hydroxy  Date Value Ref Range Status  10/04/2023 17.7 (L) 30.0 - 100.0 ng/mL Final    Comment:    Vitamin D  deficiency has been defined by the Institute of Medicine and an Endocrine Society practice guideline as a level of serum 25-OH vitamin D  less than 20 ng/mL (1,2). The Endocrine Society went on to further define vitamin D  insufficiency as a level between 21 and 29 ng/mL (2). 1. IOM (Institute of Medicine). 2010. Dietary reference    intakes for calcium  and D. [place]: The    [employer]. 2. Holick MF, Binkley [place], Bischoff-Ferrari HA, et al.    Evaluation, treatment, and prevention of vitamin D     deficiency: an Endocrine Society clinical practice    guideline. JCEM. 2011 Jul; 96(7):1911-30.          Passed - Ca in normal range and within 360 days    Calcium   Date Value Ref Range Status  10/04/2023 9.6 8.6 - 10.2 mg/dL Final         Passed - Valid encounter within last 12 months    Recent Outpatient Visits           2 weeks ago Upper respiratory tract infection, unspecified type   [place] [facility] Herold Hadassah SQUIBB, MD   3 months ago Cellulitis of great toe of left foot   [place] [address] [place], Megan P, DO   3 months ago Cellulitis of great toe of left foot   [facility] [place], Megan P, DO   4 months ago Cellulitis of great toe  of left foot   [facility] [place], Megan P, DO   5 months ago Ingrown left greater toenail   [place] [facility] [place], Duwaine SQUIBB, [place]       Future Appointments             Tomorrow Fernand Denyse LABOR, MD [facility]

## 2024-03-27 ENCOUNTER — Ambulatory Visit: Admitting: Cardiovascular Disease

## 2024-03-27 ENCOUNTER — Encounter: Payer: Self-pay | Admitting: Cardiovascular Disease

## 2024-03-27 VITALS — BP 124/81 | HR 66 | Ht 67.0 in | Wt 230.0 lb

## 2024-03-27 DIAGNOSIS — I10 Essential (primary) hypertension: Secondary | ICD-10-CM

## 2024-03-27 DIAGNOSIS — N182 Chronic kidney disease, stage 2 (mild): Secondary | ICD-10-CM

## 2024-03-27 DIAGNOSIS — E782 Mixed hyperlipidemia: Secondary | ICD-10-CM

## 2024-03-27 DIAGNOSIS — I25738 Atherosclerosis of nonautologous biological coronary artery bypass graft(s) with other forms of angina pectoris: Secondary | ICD-10-CM | POA: Diagnosis not present

## 2024-03-27 DIAGNOSIS — I5033 Acute on chronic diastolic (congestive) heart failure: Secondary | ICD-10-CM | POA: Diagnosis not present

## 2024-03-27 NOTE — Progress Notes (Signed)
 Cardiology Office Note   Date:  03/27/2024   ID:  DAYLN TUGWELL, DOB 27-Jan-1951, MRN [id number]  PCP:  V[place] Duwaine SQUIBB, DO  Cardiologist:  Denyse Bathe, MD      History of Present Illness: Ricky Melton is a 73 y.o. male who presents for  Chief Complaint  Patient presents with   Follow-up    3 month follow up    Doing well      Past Medical History:  Diagnosis Date   Benign hematuria    CAD (coronary artery disease)    Chronic kidney disease    Hyperlipidemia    Hypertension      Past Surgical History:  Procedure Laterality Date   cardiac stents     x2   COLONOSCOPY WITH PROPOFOL  N/A 05/10/2018   Procedure: COLONOSCOPY WITH PROPOFOL ;  Surgeon: Therisa Bi, MD;  Location: [facility] ENDOSCOPY;  Service: Gastroenterology;  Laterality: N/A;   EYE SURGERY Right 2021   cataract sx in [place]      Current Outpatient Medications  Medication Sig Dispense Refill   acetaminophen  (TYLENOL ) 500 MG tablet Take 1,000 mg by mouth every 6 (six) hours as needed.     allopurinol  (ZYLOPRIM ) 300 MG tablet Take 0.5 tablets (150 mg total) by mouth daily. 100 tablet 1   amLODipine  (NORVASC ) 10 MG tablet Take 1 tablet (10 mg total) by mouth every evening. 100 tablet 1   aspirin  81 MG tablet Take 81 mg by mouth daily.     benazepril  (LOTENSIN ) 40 MG tablet Take 1 tablet (40 mg total) by mouth daily. 100 tablet 1   carvedilol  (COREG ) 25 MG tablet Take 1 tablet (25 mg total) by mouth 2 (two) times daily. 60 tablet 11   clopidogrel  (PLAVIX ) 75 MG tablet Take 1 tablet (75 mg total) by mouth daily. 100 tablet 1   levothyroxine  (SYNTHROID ) 50 MCG tablet Take 1 tablet (50 mcg total) by mouth daily. 100 tablet 2   methylPREDNISolone  (MEDROL  DOSEPAK) 4 MG TBPK tablet Follow instructions 21 each 0   rosuvastatin  (CRESTOR ) 40 MG tablet Take 1 tablet (40 mg total) by mouth daily. 100 tablet 1   Vitamin D , Ergocalciferol , (DRISDOL ) 1.25 MG (50000 UNIT) CAPS capsule Take 1 capsule (50,000 Units  total) by mouth every 7 (seven) days. 12 capsule 1   No current facility-administered medications for this visit.    Allergies:   Hydrochlorothiazide     Social History:   reports that he has never smoked. He has been exposed to tobacco smoke. He quit smokeless tobacco use about 13 years ago.  His smokeless tobacco use included chew. He reports that he does not drink alcohol and does not use drugs.   Family History:  family history includes Diabetes in his sister and son; Hypertension in his mother; Other (age of onset: 65) in his father; Other (age of onset: 18) in his mother.    ROS:     Review of Systems  Constitutional: Negative.   HENT: Negative.    Eyes: Negative.   Respiratory: Negative.    Gastrointestinal: Negative.   Genitourinary: Negative.   Musculoskeletal: Negative.   Skin: Negative.   Neurological: Negative.   Endo/Heme/Allergies: Negative.   Psychiatric/Behavioral: Negative.    All other systems reviewed and are negative.     All other systems are reviewed and negative.    PHYSICAL EXAM: VS:  BP 124/81   Pulse 66   Ht 5' 7 (1.702 m)   Wt 230 lb (  104.3 kg)   SpO2 98%   BMI 36.02 kg/m  , BMI Body mass index is 36.02 kg/m. Last weight:  Wt Readings from Last 3 Encounters:  03/27/24 230 lb (104.3 kg)  03/10/24 235 lb 3.2 oz (106.7 kg)  01/17/24 228 lb (103.4 kg)     Physical Exam Vitals reviewed.  Constitutional:      Appearance: Normal appearance. He is normal weight.  HENT:     Head: Normocephalic.     Nose: Nose normal.     Mouth/Throat:     Mouth: Mucous membranes are moist.  Eyes:     Pupils: Pupils are equal, round, and reactive to light.  Cardiovascular:     Rate and Rhythm: Normal rate and regular rhythm.     Pulses: Normal pulses.     Heart sounds: Normal heart sounds.  Pulmonary:     Effort: Pulmonary effort is normal.  Abdominal:     General: Abdomen is flat. Bowel sounds are normal.  Musculoskeletal:        General: Normal  range of motion.     Cervical back: Normal range of motion.  Skin:    General: Skin is warm.  Neurological:     General: No focal deficit present.     Mental Status: He is alert.  Psychiatric:        Mood and Affect: Mood normal.       EKG:   Recent Labs: 10/04/2023: ALT 16; BUN 16; Creatinine, Ser 1.38; Hemoglobin 14.8; Magnesium 2.2; Platelets 192; Potassium 4.5; Sodium 143; TSH 1.680    Lipid Panel    Component Value Date/Time   CHOL 131 10/04/2023 0841   CHOL 98 05/14/2019 1300   TRIG 66 10/04/2023 0841   TRIG 205 (H) 05/14/2019 1300   HDL 52 10/04/2023 0841   CHOLHDL 2.9 05/24/2021 0823   VLDL 41 (H) 05/14/2019 1300   LDLCALC 65 10/04/2023 0841      Other studies Reviewed: Additional studies/ records that were reviewed today include:  Review of the above records demonstrates:       No data to display            ASSESSMENT AND PLAN:    ICD-10-CM   1. Mixed hyperlipidemia  E78.2     2. CKD (chronic kidney disease), stage II  N18.2     3. 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 Essential hypertension  I10    BP stable    5. CHF (congestive heart failure), NYHA class III, acute on chronic, diastolic (HCC)  I50.33        Problem List Items Addressed This Visit       Cardiovascular and Mediastinum   CAD (coronary artery disease)   Essential hypertension     Genitourinary   CKD (chronic kidney disease), stage II     Other   Hyperlipidemia - Primary   Other Visit Diagnoses       CHF (congestive heart failure), NYHA class III, acute on chronic, diastolic (HCC)              Disposition:   Return in about 3 months (around 06/27/2024).    Total time spent: 35 minutes  Signed,  Denyse Bathe, MD  03/27/2024 9:26 AM    [facility]

## 2024-03-28 NOTE — Telephone Encounter (Signed)
 Returned call to patient and advised that at his upcoming appointment labs will be rechecked and then determined if he needs to continue weekly supplementation or not.

## 2024-03-28 NOTE — Telephone Encounter (Signed)
 Copied from CRM [id number]. Topic: Clinical - Medical Advice >> Mar 28, 2024 12:13 PM Ricky Melton wrote: Reason for CRM: Pt is wanting a call back about Vitamin D  being refused. Please reach out

## 2024-04-03 ENCOUNTER — Other Ambulatory Visit: Payer: Self-pay | Admitting: Family Medicine

## 2024-04-03 DIAGNOSIS — I10 Essential (primary) hypertension: Secondary | ICD-10-CM

## 2024-04-04 NOTE — Telephone Encounter (Signed)
 Requested Prescriptions  Pending Prescriptions Disp Refills   benazepril  (LOTENSIN ) 40 MG tablet [Pharmacy Med Name: Benazepril  HCl 40 MG Oral Tablet] 100 tablet 0    Sig: TAKE 1 TABLET BY MOUTH DAILY     Cardiovascular:  ACE Inhibitors Failed - 04/04/2024  5:06 PM      Failed - Cr in normal range and within 180 days    Creatinine, Ser  Date Value Ref Range Status  10/04/2023 1.38 (H) 0.76 - 1.27 mg/dL Final         Failed - K in normal range and within 180 days    Potassium  Date Value Ref Range Status  10/04/2023 4.5 3.5 - 5.2 mmol/L Final         Passed - Patient is not pregnant      Passed - Last BP in normal range    BP Readings from Last 1 Encounters:  03/27/24 124/81         Passed - Valid encounter within last 6 months    Recent Outpatient Visits           3 weeks ago Upper respiratory tract infection, unspecified type   [place] [facility] [place] Hadassah SQUIBB, MD   4 months ago Cellulitis of great toe of left foot   [place] [facility] [place], Megan P, DO   4 months ago Cellulitis of great toe of left foot   [place] [facility] [place], Megan P, DO   4 months ago Cellulitis of great toe of left foot   [place] [facility] [place], Megan P, DO   5 months ago Ingrown left greater toenail   [place] [facility] Vicci Duwaine SQUIBB, DO       Future Appointments             In 3 months Fernand Denyse LABOR, MD [facility]

## 2024-04-10 ENCOUNTER — Encounter: Payer: Self-pay | Admitting: Family Medicine

## 2024-04-10 ENCOUNTER — Ambulatory Visit (INDEPENDENT_AMBULATORY_CARE_PROVIDER_SITE_OTHER): Admitting: Family Medicine

## 2024-04-10 VITALS — BP 136/62 | HR 60 | Temp 98.0°F | Ht 67.0 in | Wt 236.0 lb

## 2024-04-10 DIAGNOSIS — N1832 Chronic kidney disease, stage 3b: Secondary | ICD-10-CM | POA: Diagnosis not present

## 2024-04-10 DIAGNOSIS — E039 Hypothyroidism, unspecified: Secondary | ICD-10-CM

## 2024-04-10 DIAGNOSIS — M1000 Idiopathic gout, unspecified site: Secondary | ICD-10-CM

## 2024-04-10 DIAGNOSIS — E782 Mixed hyperlipidemia: Secondary | ICD-10-CM

## 2024-04-10 DIAGNOSIS — E559 Vitamin D deficiency, unspecified: Secondary | ICD-10-CM | POA: Diagnosis not present

## 2024-04-10 DIAGNOSIS — R7303 Prediabetes: Secondary | ICD-10-CM

## 2024-04-10 DIAGNOSIS — I10 Essential (primary) hypertension: Secondary | ICD-10-CM

## 2024-04-10 LAB — BAYER DCA HB A1C WAIVED: HB A1C (BAYER DCA - WAIVED): 5.8 % — ABNORMAL HIGH (ref 4.8–5.6)

## 2024-04-10 MED ORDER — AMLODIPINE BESYLATE 10 MG PO TABS: 10.0000 mg | ORAL_TABLET | Freq: Every evening | ORAL | 1 refills | Status: DC

## 2024-04-10 MED ORDER — ALLOPURINOL 300 MG PO TABS: 150.0000 mg | ORAL_TABLET | Freq: Every day | ORAL | 1 refills | Status: DC

## 2024-04-10 MED ORDER — BENAZEPRIL HCL 40 MG PO TABS: 40.0000 mg | ORAL_TABLET | Freq: Every day | ORAL | 0 refills | Status: DC

## 2024-04-10 MED ORDER — CLOPIDOGREL BISULFATE 75 MG PO TABS: 75.0000 mg | ORAL_TABLET | Freq: Every day | ORAL | 1 refills | Status: DC

## 2024-04-10 MED ORDER — ROSUVASTATIN CALCIUM 40 MG PO TABS: 40.0000 mg | ORAL_TABLET | Freq: Every day | ORAL | 1 refills | Status: DC

## 2024-04-10 MED ORDER — CARVEDILOL 25 MG PO TABS: 25.0000 mg | ORAL_TABLET | Freq: Two times a day (BID) | ORAL | 11 refills | Status: DC

## 2024-04-10 NOTE — Assessment & Plan Note (Signed)
 Under good control on current regimen. Continue current regimen. Continue to monitor. Call with any concerns. Refills given. Labs drawn today.

## 2024-04-10 NOTE — Assessment & Plan Note (Signed)
 Encouraged diet and exercise with goal of losing 1-2lbs per week. Call with any concerns.

## 2024-04-10 NOTE — Assessment & Plan Note (Signed)
 Rechecking labs today. Await results. Treat as needed.

## 2024-04-10 NOTE — Progress Notes (Signed)
 BP 136/62   Pulse 60   Temp 98 F (36.7 C) (Oral)   Ht 5' 7 (1.702 m)   Wt 236 lb (107 kg)   SpO2 96%   BMI 36.96 kg/m    Subjective:    Patient ID: Ricky Melton, male    DOB: 1951/08/12, 73 y.o.   MRN: [id number]  HPI: Ricky Melton is a 73 y.o. male  Chief Complaint  Patient presents with   Hypertension   Hyperlipidemia   Hypothyroidism   Coronary Artery Disease   Obesity   Impaired Fasting Glucose HbA1C:  Lab Results  Component Value Date   HGBA1C 6.0 (H) 04/06/2023   Duration of elevated blood sugar: chronic Polydipsia: no Polyuria: no Weight change: no Visual disturbance: no Glucose Monitoring: no Diabetic Education: Not Completed Family history of diabetes: yes  HYPERTENSION / HYPERLIPIDEMIA Satisfied with current treatment? yes Duration of hypertension: chronic BP monitoring frequency: not checking BP medication side effects: no Past BP meds: carvedilol , benazepril , amlodipine  Duration of hyperlipidemia: chronic Cholesterol medication side effects: no Cholesterol supplements: none Past cholesterol medications: crestor  Medication compliance: excellent compliance Aspirin : yes Recent stressors: no Recurrent headaches: no Visual changes: no Palpitations: no Dyspnea: no Chest pain: no Lower extremity edema: no Dizzy/lightheaded: no  No gout flares. Tolerating medicine well.   HYPOTHYROIDISM Thyroid  control status:controlled Satisfied with current treatment? yes Medication side effects: no Medication compliance: excellent compliance Recent dose adjustment:no Fatigue: no Cold intolerance: no Heat intolerance: no Weight gain: no Weight loss: no Constipation: no Diarrhea/loose stools: no Palpitations: no Lower extremity edema: no Anxiety/depressed mood: no   Relevant past medical, surgical, family and social history reviewed and updated as indicated. Interim medical history since our last visit reviewed. Allergies and medications  reviewed and updated.  Review of Systems  Constitutional: Negative.   Respiratory: Negative.    Cardiovascular: Negative.   Musculoskeletal:  Positive for arthralgias. Negative for back pain, gait problem, joint swelling, myalgias, neck pain and neck stiffness.  Skin: Negative.   Neurological: Negative.   Psychiatric/Behavioral: Negative.      Per HPI unless specifically indicated above     Objective:    BP 136/62   Pulse 60   Temp 98 F (36.7 C) (Oral)   Ht 5' 7 (1.702 m)   Wt 236 lb (107 kg)   SpO2 96%   BMI 36.96 kg/m   Wt Readings from Last 3 Encounters:  04/10/24 236 lb (107 kg)  03/27/24 230 lb (104.3 kg)  03/10/24 235 lb 3.2 oz (106.7 kg)    Physical Exam Vitals and nursing note reviewed.  Constitutional:      General: He is not in acute distress.    Appearance: Normal appearance. He is obese. He is not ill-appearing, toxic-appearing or diaphoretic.  HENT:     Head: Normocephalic and atraumatic.     Right Ear: External ear normal.     Left Ear: External ear normal.     Nose: Nose normal.     Mouth/Throat:     Mouth: Mucous membranes are moist.     Pharynx: Oropharynx is clear.  Eyes:     General: No scleral icterus.       Right eye: No discharge.        Left eye: No discharge.     Extraocular Movements: Extraocular movements intact.     Conjunctiva/sclera: Conjunctivae normal.     Pupils: Pupils are equal, round, and reactive to light.  Cardiovascular:  Rate and Rhythm: Normal rate and regular rhythm.     Pulses: Normal pulses.     Heart sounds: Normal heart sounds. No murmur heard.    No friction rub. No gallop.  Pulmonary:     Effort: Pulmonary effort is normal. No respiratory distress.     Breath sounds: Normal breath sounds. No stridor. No wheezing, rhonchi or rales.  Chest:     Chest wall: No tenderness.  Musculoskeletal:        General: Normal range of motion.     Cervical back: Normal range of motion and neck supple.  Skin:     General: Skin is warm and dry.     Capillary Refill: Capillary refill takes less than 2 seconds.     Coloration: Skin is not jaundiced or pale.     Findings: No bruising, erythema, lesion or rash.  Neurological:     General: No focal deficit present.     Mental Status: He is alert and oriented to person, place, and time. Mental status is at baseline.  Psychiatric:        Mood and Affect: Mood normal.        Behavior: Behavior normal.        Thought Content: Thought content normal.        Judgment: Judgment normal.     Results for orders placed or performed in visit on 03/10/24  Rapid Strep Screen ([facility] ONLY)   Collection Time: 03/10/24  9:57 AM   Specimen: Other   Other  Result Value Ref Range   Strep Gp A Ag, IA W/Reflex Negative Negative  Culture, Group A Strep   Collection Time: 03/10/24  9:57 AM   Other  Result Value Ref Range   Strep A Culture Negative   POC Covid19/Flu A&B Antigen   Collection Time: 03/10/24 10:12 AM  Result Value Ref Range   Influenza A Antigen, POC Negative Negative   Influenza B Antigen, POC Negative Negative   Covid Antigen, POC Negative Negative      Assessment & Plan:   Problem List Items Addressed This Visit       Cardiovascular and Mediastinum   Essential hypertension   Under good control on current regimen. Continue current regimen. Continue to monitor. Call with any concerns. Refills given. Labs drawn today.      Relevant Medications   amLODipine  (NORVASC ) 10 MG tablet   benazepril  (LOTENSIN ) 40 MG tablet   carvedilol  (COREG ) 25 MG tablet   rosuvastatin  (CRESTOR ) 40 MG tablet   Other Relevant Orders   CBC with Differential/Platelet   Comprehensive metabolic panel with GFR     Endocrine   Hypothyroidism   Rechecking labs today. Await results. Treat as needed.       Relevant Medications   carvedilol  (COREG ) 25 MG tablet   Other Relevant Orders   CBC with Differential/Platelet   Comprehensive metabolic panel with  GFR   TSH     Genitourinary   Stage 3b chronic kidney disease (HCC)   Rechecking labs today. Await results. Treat as needed.         Other   Hyperlipidemia - Primary   Under good control on current regimen. Continue current regimen. Continue to monitor. Call with any concerns. Refills given. Labs drawn today.       Relevant Medications   amLODipine  (NORVASC ) 10 MG tablet   benazepril  (LOTENSIN ) 40 MG tablet   carvedilol  (COREG ) 25 MG tablet   rosuvastatin  (CRESTOR ) 40 MG  tablet   Other Relevant Orders   CBC with Differential/Platelet   Comprehensive metabolic panel with GFR   Lipid Panel w/o Chol/HDL Ratio   Gout   Under good control on current regimen. Continue current regimen. Continue to monitor. Call with any concerns. Refills given. Labs drawn today.      Relevant Medications   allopurinol  (ZYLOPRIM ) 300 MG tablet   Other Relevant Orders   CBC with Differential/Platelet   Comprehensive metabolic panel with GFR   Uric acid   Morbid obesity (HCC)   Encouraged diet and exercise with goal of losing 1-2lbs per week. Call with any concerns.       Relevant Orders   CBC with Differential/Platelet   Comprehensive metabolic panel with GFR   Prediabetes   Rechecking labs today. Await results.       Relevant Orders   Bayer DCA Hb A1c Waived   CBC with Differential/Platelet   Comprehensive metabolic panel with GFR   Vitamin D  deficiency   Rechecking labs today. Await results. Treat as needed.       Relevant Orders   CBC with Differential/Platelet   Comprehensive metabolic panel with GFR   VITAMIN D  25 Hydroxy (Vit-D Deficiency, Fractures)     Follow up plan: Return in about 6 months (around 10/11/2024) for physical.

## 2024-04-10 NOTE — Assessment & Plan Note (Signed)
 Rechecking labs today. Await results.

## 2024-04-11 ENCOUNTER — Ambulatory Visit: Payer: Self-pay | Admitting: Family Medicine

## 2024-04-11 DIAGNOSIS — E039 Hypothyroidism, unspecified: Secondary | ICD-10-CM

## 2024-04-11 LAB — CBC WITH DIFFERENTIAL/PLATELET
Basophils Absolute: 0.1 x10E3/uL (ref 0.0–0.2)
Basos: 1 %
EOS (ABSOLUTE): 0.2 x10E3/uL (ref 0.0–0.4)
Eos: 4 %
Hematocrit: 41.4 % (ref 37.5–51.0)
Hemoglobin: 13.3 g/dL (ref 13.0–17.7)
Immature Grans (Abs): 0 x10E3/uL (ref 0.0–0.1)
Immature Granulocytes: 0 %
Lymphocytes Absolute: 2.1 x10E3/uL (ref 0.7–3.1)
Lymphs: 32 %
MCH: 28.3 pg (ref 26.6–33.0)
MCHC: 32.1 g/dL (ref 31.5–35.7)
MCV: 88 fL (ref 79–97)
Monocytes Absolute: 0.5 x10E3/uL (ref 0.1–0.9)
Monocytes: 8 %
Neutrophils Absolute: 3.7 x10E3/uL (ref 1.4–7.0)
Neutrophils: 55 %
Platelets: 213 x10E3/uL (ref 150–450)
RBC: 4.7 x10E6/uL (ref 4.14–5.80)
RDW: 13.7 % (ref 11.6–15.4)
WBC: 6.6 x10E3/uL (ref 3.4–10.8)

## 2024-04-11 LAB — VITAMIN D 25 HYDROXY (VIT D DEFICIENCY, FRACTURES): Vit D, 25-Hydroxy: 42.6 ng/mL (ref 30.0–100.0)

## 2024-04-11 LAB — COMPREHENSIVE METABOLIC PANEL WITH GFR
ALT: 14 IU/L (ref 0–44)
AST: 21 IU/L (ref 0–40)
Albumin: 4.2 g/dL (ref 3.8–4.8)
Alkaline Phosphatase: 60 IU/L (ref 44–121)
BUN/Creatinine Ratio: 14 (ref 10–24)
BUN: 19 mg/dL (ref 8–27)
Bilirubin Total: 0.4 mg/dL (ref 0.0–1.2)
CO2: 18 mmol/L — ABNORMAL LOW (ref 20–29)
Calcium: 9.5 mg/dL (ref 8.6–10.2)
Chloride: 108 mmol/L — ABNORMAL HIGH (ref 96–106)
Creatinine, Ser: 1.35 mg/dL — ABNORMAL HIGH (ref 0.76–1.27)
Globulin, Total: 2.5 g/dL (ref 1.5–4.5)
Glucose: 98 mg/dL (ref 70–99)
Potassium: 4.3 mmol/L (ref 3.5–5.2)
Sodium: 141 mmol/L (ref 134–144)
Total Protein: 6.7 g/dL (ref 6.0–8.5)
eGFR: 55 mL/min/1.73 — ABNORMAL LOW (ref >59)

## 2024-04-11 LAB — LIPID PANEL W/O CHOL/HDL RATIO
Cholesterol, Total: 103 mg/dL (ref 100–199)
HDL: 32 mg/dL — ABNORMAL LOW (ref >39)
LDL Chol Calc (NIH): 52 mg/dL (ref 0–99)
Triglycerides: 101 mg/dL (ref 0–149)
VLDL Cholesterol Cal: 19 mg/dL (ref 5–40)

## 2024-04-11 LAB — TSH: TSH: 2.12 u[IU]/mL (ref 0.450–4.500)

## 2024-04-11 LAB — URIC ACID: Uric Acid: 4.9 mg/dL (ref 3.8–8.4)

## 2024-04-11 MED ORDER — LEVOTHYROXINE SODIUM 50 MCG PO TABS
50.0000 ug | ORAL_TABLET | Freq: Every day | ORAL | 3 refills | Status: AC
Start: 2024-04-11 — End: ?

## 2024-05-22 DIAGNOSIS — M1000 Idiopathic gout, unspecified site: Secondary | ICD-10-CM | POA: Diagnosis not present

## 2024-05-22 DIAGNOSIS — N1832 Chronic kidney disease, stage 3b: Secondary | ICD-10-CM | POA: Diagnosis not present

## 2024-05-22 DIAGNOSIS — N281 Cyst of kidney, acquired: Secondary | ICD-10-CM | POA: Diagnosis not present

## 2024-05-22 DIAGNOSIS — R809 Proteinuria, unspecified: Secondary | ICD-10-CM | POA: Diagnosis not present

## 2024-05-28 DIAGNOSIS — N281 Cyst of kidney, acquired: Secondary | ICD-10-CM | POA: Diagnosis not present

## 2024-05-28 DIAGNOSIS — N1831 Chronic kidney disease, stage 3a: Secondary | ICD-10-CM | POA: Diagnosis not present

## 2024-05-28 DIAGNOSIS — N1832 Chronic kidney disease, stage 3b: Secondary | ICD-10-CM | POA: Diagnosis not present

## 2024-05-28 DIAGNOSIS — R809 Proteinuria, unspecified: Secondary | ICD-10-CM | POA: Diagnosis not present

## 2024-05-28 DIAGNOSIS — M1000 Idiopathic gout, unspecified site: Secondary | ICD-10-CM | POA: Diagnosis not present

## 2024-05-28 DIAGNOSIS — I10 Essential (primary) hypertension: Secondary | ICD-10-CM | POA: Diagnosis not present

## 2024-06-14 ENCOUNTER — Other Ambulatory Visit: Payer: Self-pay | Admitting: Family Medicine

## 2024-06-14 DIAGNOSIS — I10 Essential (primary) hypertension: Secondary | ICD-10-CM

## 2024-06-17 NOTE — Telephone Encounter (Signed)
 Requested Prescriptions  Pending Prescriptions Disp Refills   benazepril  (LOTENSIN ) 40 MG tablet [Pharmacy Med Name: Benazepril  HCl 40 MG Oral Tablet] 100 tablet 0    Sig: TAKE 1 TABLET BY MOUTH DAILY     Cardiovascular:  ACE Inhibitors Failed - 06/17/2024  9:20 AM      Failed - Cr in normal range and within 180 days    Creatinine, Ser  Date Value Ref Range Status  04/10/2024 1.35 (H) 0.76 - 1.27 mg/dL Final         Passed - K in normal range and within 180 days    Potassium  Date Value Ref Range Status  04/10/2024 4.3 3.5 - 5.2 mmol/L Final         Passed - Patient is not pregnant      Passed - Last BP in normal range    BP Readings from Last 1 Encounters:  04/10/24 136/62         Passed - Valid encounter within last 6 months    Recent Outpatient Visits           2 months ago Mixed hyperlipidemia   [facility] [place], Megan P, DO   3 months ago Upper respiratory tract infection, unspecified type   [place] Herold Hadassah SQUIBB, MD   6 months ago Cellulitis of great toe of left foot   [facility] [place], Megan P, DO   6 months ago Cellulitis of great toe of left foot   [place] [facility] [place], Megan P, DO   6 months ago Cellulitis of great toe of left foot   [place] [facility] Vicci Duwaine SQUIBB, DO       Future Appointments             In 1 month Fernand Denyse LABOR, MD [facility]

## 2024-06-23 ENCOUNTER — Ambulatory Visit (INDEPENDENT_AMBULATORY_CARE_PROVIDER_SITE_OTHER)

## 2024-06-23 DIAGNOSIS — Z23 Encounter for immunization: Secondary | ICD-10-CM

## 2024-06-23 NOTE — Progress Notes (Signed)
 Patient is in office today for a nurse visit for Immunization. Patient Injection was given in the  Right deltoid. Patient tolerated injection well.

## 2024-07-24 ENCOUNTER — Ambulatory Visit: Admitting: Cardiovascular Disease

## 2024-07-24 ENCOUNTER — Encounter: Payer: Self-pay | Admitting: Cardiovascular Disease

## 2024-07-24 ENCOUNTER — Ambulatory Visit: Admitting: Family Medicine

## 2024-07-24 VITALS — BP 124/76 | HR 64 | Ht 67.0 in | Wt 237.4 lb

## 2024-07-24 DIAGNOSIS — I5033 Acute on chronic diastolic (congestive) heart failure: Secondary | ICD-10-CM

## 2024-07-24 DIAGNOSIS — I2510 Atherosclerotic heart disease of native coronary artery without angina pectoris: Secondary | ICD-10-CM

## 2024-07-24 DIAGNOSIS — I25738 Atherosclerosis of nonautologous biological coronary artery bypass graft(s) with other forms of angina pectoris: Secondary | ICD-10-CM

## 2024-07-24 DIAGNOSIS — I10 Essential (primary) hypertension: Secondary | ICD-10-CM

## 2024-07-24 DIAGNOSIS — E782 Mixed hyperlipidemia: Secondary | ICD-10-CM

## 2024-07-24 DIAGNOSIS — N182 Chronic kidney disease, stage 2 (mild): Secondary | ICD-10-CM

## 2024-07-24 MED ORDER — EMPAGLIFLOZIN 10 MG PO TABS: 10.0000 mg | ORAL_TABLET | Freq: Every day | ORAL | 2 refills | Status: DC

## 2024-07-24 NOTE — Progress Notes (Signed)
 Cardiology Office Note   Date:  07/24/2024   ID:  Ricky Melton, DOB 02-13-1951, MRN [id number]  PCP:  Vicci Duwaine SQUIBB, DO  Cardiologist:  Denyse Bathe, MD      History of Present Illness: Ricky Melton is a 73 y.o. male who presents for  Chief Complaint  Patient presents with   Follow-up    3 month follow up    Doing well.      Past Medical History:  Diagnosis Date   Benign hematuria    CAD (coronary artery disease)    Chronic kidney disease    Hyperlipidemia    Hypertension      Past Surgical History:  Procedure Laterality Date   cardiac stents     x2   COLONOSCOPY WITH PROPOFOL  N/A 05/10/2018   Procedure: COLONOSCOPY WITH PROPOFOL ;  Surgeon: Therisa Bi, MD;  Location: [facility] ENDOSCOPY;  Service: Gastroenterology;  Laterality: N/A;   EYE SURGERY Right 2021   cataract sx in [place]      Current Outpatient Medications  Medication Sig Dispense Refill   acetaminophen  (TYLENOL ) 500 MG tablet Take 1,000 mg by mouth every 6 (six) hours as needed.     allopurinol  (ZYLOPRIM ) 300 MG tablet Take 0.5 tablets (150 mg total) by mouth daily. 100 tablet 1   amLODipine  (NORVASC ) 10 MG tablet Take 1 tablet (10 mg total) by mouth every evening. 100 tablet 1   aspirin  81 MG tablet Take 81 mg by mouth daily.     benazepril  (LOTENSIN ) 40 MG tablet TAKE 1 TABLET BY MOUTH DAILY 100 tablet 0   carvedilol  (COREG ) 25 MG tablet Take 1 tablet (25 mg total) by mouth 2 (two) times daily. 60 tablet 11   clopidogrel  (PLAVIX ) 75 MG tablet Take 1 tablet (75 mg total) by mouth daily. 100 tablet 1   empagliflozin (JARDIANCE) 10 MG TABS tablet Take 1 tablet (10 mg total) by mouth daily before breakfast. 30 tablet 2   levothyroxine  (SYNTHROID ) 50 MCG tablet Take 1 tablet (50 mcg total) by mouth daily. 100 tablet 3   rosuvastatin  (CRESTOR ) 40 MG tablet Take 1 tablet (40 mg total) by mouth daily. 100 tablet 1   Vitamin D , Ergocalciferol , (DRISDOL ) 1.25 MG (50000 UNIT) CAPS capsule Take 1  capsule (50,000 Units total) by mouth every 7 (seven) days. 12 capsule 1   No current facility-administered medications for this visit.    Allergies:   Hydrochlorothiazide     Social History:   reports that he has never smoked. He has been exposed to tobacco smoke. He quit smokeless tobacco use about 13 years ago.  His smokeless tobacco use included chew. He reports that he does not drink alcohol and does not use drugs.   Family History:  family history includes Diabetes in his sister and son; Hypertension in his mother; Other (age of onset: 21) in his father; Other (age of onset: 42) in his mother.    ROS:     Review of Systems  Constitutional: Negative.   HENT: Negative.    Eyes: Negative.   Respiratory: Negative.    Gastrointestinal: Negative.   Genitourinary: Negative.   Musculoskeletal: Negative.   Skin: Negative.   Neurological: Negative.   Endo/Heme/Allergies: Negative.   Psychiatric/Behavioral: Negative.    All other systems reviewed and are negative.     All other systems are reviewed and negative.    PHYSICAL EXAM: VS:  BP 124/76   Pulse 64   Ht 5' 7 (1.702 m)  Wt 237 lb 6.4 oz (107.7 kg)   SpO2 95%   BMI 37.18 kg/m  , BMI Body mass index is 37.18 kg/m. Last weight:  Wt Readings from Last 3 Encounters:  07/24/24 237 lb 6.4 oz (107.7 kg)  04/10/24 236 lb (107 kg)  03/27/24 230 lb (104.3 kg)     Physical Exam Vitals reviewed.  Constitutional:      Appearance: Normal appearance. He is normal weight.  HENT:     Head: Normocephalic.     Nose: Nose normal.     Mouth/Throat:     Mouth: Mucous membranes are moist.  Eyes:     Pupils: Pupils are equal, round, and reactive to light.  Cardiovascular:     Rate and Rhythm: Normal rate and regular rhythm.     Pulses: Normal pulses.     Heart sounds: Normal heart sounds.  Pulmonary:     Effort: Pulmonary effort is normal.  Abdominal:     General: Abdomen is flat. Bowel sounds are normal.   Musculoskeletal:        General: Normal range of motion.     Cervical back: Normal range of motion.  Skin:    General: Skin is warm.  Neurological:     General: No focal deficit present.     Mental Status: He is alert.  Psychiatric:        Mood and Affect: Mood normal.       EKG:   Recent Labs: 10/04/2023: Magnesium 2.2 04/10/2024: ALT 14; BUN 19; Creatinine, Ser 1.35; Hemoglobin 13.3; Platelets 213; Potassium 4.3; Sodium 141; TSH 2.120    Lipid Panel    Component Value Date/Time   CHOL 103 04/10/2024 0850   CHOL 98 05/14/2019 1300   TRIG 101 04/10/2024 0850   TRIG 205 (H) 05/14/2019 1300   HDL 32 (L) 04/10/2024 0850   CHOLHDL 2.9 05/24/2021 0823   VLDL 41 (H) 05/14/2019 1300   LDLCALC 52 04/10/2024 0850      Other studies Reviewed: Additional studies/ records that were reviewed today include:  Review of the above records demonstrates:       No data to display            ASSESSMENT AND PLAN:    ICD-10-CM   1. 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 Essential hypertension  I10 empagliflozin (JARDIANCE) 10 MG TABS tablet    PCV ECHOCARDIOGRAM COMPLETE    3. Morbid obesity (HCC)  E66.01 empagliflozin (JARDIANCE) 10 MG TABS tablet    PCV ECHOCARDIOGRAM COMPLETE    4. CHF (congestive heart failure), NYHA class III, acute on chronic, diastolic (HCC)  I50.33 empagliflozin (JARDIANCE) 10 MG TABS tablet    PCV ECHOCARDIOGRAM COMPLETE   Has diastolic dysfunction, and creat 1.39, advise jaurdiance    5. Primary hypertension  I10 empagliflozin (JARDIANCE) 10 MG TABS tablet    PCV ECHOCARDIOGRAM COMPLETE    6. Coronary artery disease involving native coronary artery of native heart without angina pectoris  I25.10 empagliflozin (JARDIANCE) 10 MG TABS tablet    PCV ECHOCARDIOGRAM COMPLETE    7. CKD (chronic kidney  disease), stage II  N18.2 empagliflozin (JARDIANCE) 10 MG TABS tablet    PCV ECHOCARDIOGRAM COMPLETE    8. Mixed hyperlipidemia  E78.2 empagliflozin (JARDIANCE) 10 MG TABS tablet    PCV ECHOCARDIOGRAM COMPLETE  Problem List Items Addressed This Visit       Cardiovascular and Mediastinum   CAD (coronary artery disease) - Primary   Relevant Medications   empagliflozin (JARDIANCE) 10 MG TABS tablet   Other Relevant Orders   PCV ECHOCARDIOGRAM COMPLETE   PCV ECHOCARDIOGRAM COMPLETE   Essential hypertension   Relevant Medications   empagliflozin (JARDIANCE) 10 MG TABS tablet   Other Relevant Orders   PCV ECHOCARDIOGRAM COMPLETE     Other   Hyperlipidemia   Relevant Medications   empagliflozin (JARDIANCE) 10 MG TABS tablet   Other Relevant Orders   PCV ECHOCARDIOGRAM COMPLETE   Morbid obesity (HCC)   Relevant Medications   empagliflozin (JARDIANCE) 10 MG TABS tablet   Other Relevant Orders   PCV ECHOCARDIOGRAM COMPLETE   Other Visit Diagnoses       CHF (congestive heart failure), NYHA class III, acute on chronic, diastolic (HCC)       Has diastolic dysfunction, and creat 1.39, advise jaurdiance   Relevant Medications   empagliflozin (JARDIANCE) 10 MG TABS tablet   Other Relevant Orders   PCV ECHOCARDIOGRAM COMPLETE     Primary hypertension       Relevant Medications   empagliflozin (JARDIANCE) 10 MG TABS tablet   Other Relevant Orders   PCV ECHOCARDIOGRAM COMPLETE     CKD (chronic kidney disease), stage II       Relevant Medications   empagliflozin (JARDIANCE) 10 MG TABS tablet   Other Relevant Orders   PCV ECHOCARDIOGRAM COMPLETE          Disposition:   Return in about 3 months (around 10/24/2024) for echo prior to visit.    Total time spent: 50 minutes  Signed,  Denyse Bathe, MD  07/24/2024 9:21 AM    [facility]

## 2024-07-31 ENCOUNTER — Ambulatory Visit (INDEPENDENT_AMBULATORY_CARE_PROVIDER_SITE_OTHER): Admitting: Family Medicine

## 2024-07-31 ENCOUNTER — Encounter: Payer: Self-pay | Admitting: Family Medicine

## 2024-07-31 ENCOUNTER — Ambulatory Visit

## 2024-07-31 VITALS — BP 136/58 | HR 69 | Temp 97.8°F | Ht 64.0 in | Wt 238.2 lb

## 2024-07-31 DIAGNOSIS — L600 Ingrowing nail: Secondary | ICD-10-CM

## 2024-07-31 NOTE — Progress Notes (Signed)
 BP (!) 136/58   Pulse 69   Temp 97.8 F (36.6 C) (Oral)   Ht 5' 4 (1.626 m)   Wt 238 lb 3.2 oz (108 kg)   SpO2 98%   BMI 40.89 kg/m    Subjective:    Patient ID: Ricky Melton, male    DOB: 1951-05-22, 73 y.o.   MRN: [id number]  HPI: Ricky Melton is a 73 y.o. male  Chief Complaint  Patient presents with   ingrown toe nail   TOE PAIN Duration: months Involved toe: rightbig toe  Mechanism of injury: unknown Onset: gradual Severity: mild  Quality: sore and aching Frequency: intermittent Radiation: no Aggravating factors: when it was dug out at the pedicurist  Alleviating factors: nothing  Status: stable Treatments attempted: nothing  Relief with NSAIDs?: No NSAIDs Taken Morning stiffness: no Redness: no  Bruising: no Swelling: no Paresthesias / decreased sensation: no Fevers: no   Relevant past medical, surgical, family and social history reviewed and updated as indicated. Interim medical history since our last visit reviewed. Allergies and medications reviewed and updated.  Review of Systems  Constitutional: Negative.   Respiratory: Negative.    Cardiovascular: Negative.   Musculoskeletal: Negative.   Skin: Negative.  Negative for color change, pallor, rash and wound.  Psychiatric/Behavioral: Negative.      Per HPI unless specifically indicated above     Objective:    BP (!) 136/58   Pulse 69   Temp 97.8 F (36.6 C) (Oral)   Ht 5' 4 (1.626 m)   Wt 238 lb 3.2 oz (108 kg)   SpO2 98%   BMI 40.89 kg/m   Wt Readings from Last 3 Encounters:  07/31/24 238 lb 3.2 oz (108 kg)  07/24/24 237 lb 6.4 oz (107.7 kg)  04/10/24 236 lb (107 kg)    Physical Exam Vitals and nursing note reviewed.  Constitutional:      General: He is not in acute distress.    Appearance: Normal appearance. He is not ill-appearing, toxic-appearing or diaphoretic.  HENT:     Head: Normocephalic and atraumatic.     Right Ear: External ear normal.     Left Ear: External  ear normal.     Nose: Nose normal.     Mouth/Throat:     Mouth: Mucous membranes are moist.     Pharynx: Oropharynx is clear.  Eyes:     General: No scleral icterus.       Right eye: No discharge.        Left eye: No discharge.     Extraocular Movements: Extraocular movements intact.     Conjunctiva/sclera: Conjunctivae normal.     Pupils: Pupils are equal, round, and reactive to light.  Cardiovascular:     Rate and Rhythm: Normal rate and regular rhythm.     Pulses: Normal pulses.     Heart sounds: Murmur heard.     No friction rub. No gallop.  Pulmonary:     Effort: Pulmonary effort is normal. No respiratory distress.     Breath sounds: Normal breath sounds. No stridor. No wheezing, rhonchi or rales.  Chest:     Chest wall: No tenderness.  Musculoskeletal:        General: Normal range of motion.     Cervical back: Normal range of motion and neck supple.  Skin:    General: Skin is warm and dry.     Capillary Refill: Capillary refill takes less than 2 seconds.  Coloration: Skin is not jaundiced or pale.     Findings: No bruising, erythema, lesion or rash.     Comments: R great toe with nail cut, was ingrown. No redness, no swelling  Neurological:     General: No focal deficit present.     Mental Status: He is alert and oriented to person, place, and time. Mental status is at baseline.  Psychiatric:        Mood and Affect: Mood normal.        Behavior: Behavior normal.        Thought Content: Thought content normal.        Judgment: Judgment normal.     Results for orders placed or performed in visit on 04/10/24  Bayer DCA Hb A1c Waived   Collection Time: 04/10/24  8:49 AM  Result Value Ref Range   HB A1C (BAYER DCA - WAIVED) 5.8 (H) 4.8 - 5.6 %  CBC with Differential/Platelet   Collection Time: 04/10/24  8:50 AM  Result Value Ref Range   WBC 6.6 3.4 - 10.8 x10E3/uL   RBC 4.70 4.14 - 5.80 x10E6/uL   Hemoglobin 13.3 13.0 - 17.7 g/dL   Hematocrit 58.5 62.4 - 51.0  %   MCV 88 79 - 97 fL   MCH 28.3 26.6 - 33.0 pg   MCHC 32.1 31.5 - 35.7 g/dL   RDW 86.2 88.3 - 84.5 %   Platelets 213 150 - 450 x10E3/uL   Neutrophils 55 Not Estab. %   Lymphs 32 Not Estab. %   Monocytes 8 Not Estab. %   Eos 4 Not Estab. %   Basos 1 Not Estab. %   Neutrophils Absolute 3.7 1.4 - 7.0 x10E3/uL   Lymphocytes Absolute 2.1 0.7 - 3.1 x10E3/uL   Monocytes Absolute 0.5 0.1 - 0.9 x10E3/uL   EOS (ABSOLUTE) 0.2 0.0 - 0.4 x10E3/uL   Basophils Absolute 0.1 0.0 - 0.2 x10E3/uL   Immature Granulocytes 0 Not Estab. %   Immature Grans (Abs) 0.0 0.0 - 0.1 x10E3/uL  Comprehensive metabolic panel with GFR   Collection Time: 04/10/24  8:50 AM  Result Value Ref Range   Glucose 98 70 - 99 mg/dL   BUN 19 8 - 27 mg/dL   Creatinine, Ser 8.64 (H) 0.76 - 1.27 mg/dL   eGFR 55 (L) >40 fO/fpw/8.26   BUN/Creatinine Ratio 14 10 - 24   Sodium 141 134 - 144 mmol/L   Potassium 4.3 3.5 - 5.2 mmol/L   Chloride 108 (H) 96 - 106 mmol/L   CO2 18 (L) 20 - 29 mmol/L   Calcium  9.5 8.6 - 10.2 mg/dL   Total Protein 6.7 6.0 - 8.5 g/dL   Albumin 4.2 3.8 - 4.8 g/dL   Globulin, Total 2.5 1.5 - 4.5 g/dL   Bilirubin Total 0.4 0.0 - 1.2 mg/dL   Alkaline Phosphatase 60 44 - 121 IU/L   AST 21 0 - 40 IU/L   ALT 14 0 - 44 IU/L  Lipid Panel w/o Chol/HDL Ratio   Collection Time: 04/10/24  8:50 AM  Result Value Ref Range   Cholesterol, Total 103 100 - 199 mg/dL   Triglycerides 898 0 - 149 mg/dL   HDL 32 (L) >60 mg/dL   VLDL Cholesterol Cal 19 5 - 40 mg/dL   LDL Chol Calc (NIH) 52 0 - 99 mg/dL  Uric acid   Collection Time: 04/10/24  8:50 AM  Result Value Ref Range   Uric Acid 4.9 3.8 - 8.4 mg/dL  TSH   Collection Time: 04/10/24  8:50 AM  Result Value Ref Range   TSH 2.120 0.450 - 4.500 uIU/mL  VITAMIN D  25 Hydroxy (Vit-D Deficiency, Fractures)   Collection Time: 04/10/24  8:50 AM  Result Value Ref Range   Vit D, 25-Hydroxy 42.6 30.0 - 100.0 ng/mL      Assessment & Plan:   Problem List Items Addressed  This Visit   None Visit Diagnoses       Ingrown nail of great toe of right foot    -  Primary   Not infected today. Did not do well here after L great toenail removed. Will send to podiatry for this one. Await their input.   Relevant Orders   Ambulatory referral to Podiatry        Follow up plan: Return for As scheduled.

## 2024-07-31 NOTE — Patient Instructions (Signed)
 [facility]  2 PM Today [address] [place] [id number]

## 2024-07-31 NOTE — Progress Notes (Signed)
 Subjective:  Patient ID: Ricky Melton, male    DOB: Aug 16, 1951,  MRN: [id number]  Chief Complaint  Patient presents with   Ingrown Toenail    NP- Ingrown toenail on the right. He had the entire left nail removed x 1 year ago. Denies being diabetic.     73 y.o. male presents with the above complaint.  He states that he has a history of ingrown toenails.  His left hallux ingrown toenail has been fully removed with some regrowth spicules that do not bother him.  He is here for his right hallux which has ingrown borders bilaterally.   Review of Systems: Negative except as noted in the HPI. Denies N/V/F/Ch.  Past Medical History:  Diagnosis Date   Benign hematuria    CAD (coronary artery disease)    Chronic kidney disease    Hyperlipidemia    Hypertension     Current Outpatient Medications:    acetaminophen  (TYLENOL ) 500 MG tablet, Take 1,000 mg by mouth every 6 (six) hours as needed., Disp: , Rfl:    allopurinol  (ZYLOPRIM ) 300 MG tablet, Take 0.5 tablets (150 mg total) by mouth daily., Disp: 100 tablet, Rfl: 1   amLODipine  (NORVASC ) 10 MG tablet, Take 1 tablet (10 mg total) by mouth every evening., Disp: 100 tablet, Rfl: 1   aspirin  81 MG tablet, Take 81 mg by mouth daily., Disp: , Rfl:    benazepril  (LOTENSIN ) 40 MG tablet, TAKE 1 TABLET BY MOUTH DAILY, Disp: 100 tablet, Rfl: 0   carvedilol  (COREG ) 25 MG tablet, Take 1 tablet (25 mg total) by mouth 2 (two) times daily., Disp: 60 tablet, Rfl: 11   clopidogrel  (PLAVIX ) 75 MG tablet, Take 1 tablet (75 mg total) by mouth daily., Disp: 100 tablet, Rfl: 1   empagliflozin (JARDIANCE) 10 MG TABS tablet, Take 1 tablet (10 mg total) by mouth daily before breakfast., Disp: 30 tablet, Rfl: 2   levothyroxine  (SYNTHROID ) 50 MCG tablet, Take 1 tablet (50 mcg total) by mouth daily., Disp: 100 tablet, Rfl: 3   rosuvastatin  (CRESTOR ) 40 MG tablet, Take 1 tablet (40 mg total) by mouth daily., Disp: 100 tablet, Rfl: 1   Vitamin D , Ergocalciferol ,  (DRISDOL ) 1.25 MG (50000 UNIT) CAPS capsule, Take 1 capsule (50,000 Units total) by mouth every 7 (seven) days., Disp: 12 capsule, Rfl: 1  Social History   Tobacco Use  Smoking Status Never   Passive exposure: Past  Smokeless Tobacco Former   Types: Chew   Quit date: 2012    Allergies  Allergen Reactions   Hydrochlorothiazide  Other (See Comments)    dizziness   Objective:  There were no vitals filed for this visit. There is no height or weight on file to calculate BMI. Constitutional Well developed. Well nourished.  Vascular Dorsalis pedis pulses palpable bilaterally. Posterior tibial pulses palpable bilaterally. Capillary refill normal to all digits.  No cyanosis or clubbing noted. Pedal hair growth normal.  Neurologic Normal speech. Oriented to person, place, and time. Epicritic sensation to light touch grossly present bilaterally.  Dermatologic Painful ingrowing nail at bilateral nail borders of the hallux nail right.  No signs of infection. Left hallux nail has minimal regrowth after full nail avulsion.  No signs of infection, no discomfort. No other open wounds. No skin lesions.  Orthopedic: Normal joint ROM without pain or crepitus bilaterally. No visible deformities. No bony tenderness.   Radiographs: None Assessment:   1. Ingrown right big toenail    Plan:  Patient was evaluated and treated  and all questions answered.  Ingrown Nail, right -Discussed treatment options ranging from surgical to conservative along with risks and benefits of each. Patient expresses understanding. Patient elects to proceed with minor surgery to remove ingrown toenail removal today. Consent reviewed and signed by patient. -Ingrown nail excised. See procedure note. -Educated on post-procedure care including soaking. Written instructions provided and reviewed. -Patient to follow up in 2 weeks for nail check if it is not healed.  Procedure: Excision of Ingrown Toenail Location:  Right 1st toe bilateral nail borders. Anesthesia: Lidocaine  1% plain; 1.5 mL and Marcaine 0.5% plain; 1.5 mL, digital block. Skin Prep: Betadine. Dressing: Silvadene; telfa; dry, sterile, compression dressing. Technique: Following skin prep, the toe was exsanguinated and a tourniquet was secured at the base of the toe. The affected nail border was freed, split with a nail splitter, and excised. Chemical matrixectomy was then performed with phenol and irrigated out with alcohol. The tourniquet was then removed and sterile dressing applied. Disposition: Patient tolerated procedure well. Patient to return in 2 weeks for follow-up.  Post op care: Keep dressing clean, dry, and intact for 24 hours before removing. Soak operative foot and redress BID as printed instructions describe. Patient educated on signs of infection, pt expresses understanding and will proceed for prompt medical care if signs of infection arise.    Prentice Ovens, DPM AACFAS Fellowship Trained Podiatric Surgeon [facility]

## 2024-07-31 NOTE — Patient Instructions (Signed)

## 2024-08-05 ENCOUNTER — Other Ambulatory Visit: Payer: Self-pay | Admitting: Cardiovascular Disease

## 2024-08-05 ENCOUNTER — Telehealth: Payer: Self-pay

## 2024-08-05 ENCOUNTER — Other Ambulatory Visit: Payer: Self-pay

## 2024-08-05 MED ORDER — EMPAGLIFLOZIN 25 MG PO TABS: 25.0000 mg | ORAL_TABLET | Freq: Every day | ORAL | 2 refills | Status: DC

## 2024-08-05 NOTE — Telephone Encounter (Signed)
 Pt states he was told to start Jardiance  at his last appointment (11/17) but never received it at the pharmacy. He only has one sample pill left.

## 2024-08-05 NOTE — Telephone Encounter (Signed)
 Pt states the Jardiance  is too expensive. He is requesting an alternative. I gave him samples in the mean time.

## 2024-08-05 NOTE — Telephone Encounter (Signed)
 Pt was originally supposed to be on 10mg  but today was told to take 25mg . Pt is wanting to confirm this is correct.

## 2024-08-11 ENCOUNTER — Telehealth: Payer: Self-pay | Admitting: Cardiovascular Disease

## 2024-08-11 NOTE — Telephone Encounter (Signed)
 Patient Ricky Melton asking for call back about medications, he stated he spoke with someone on Friday, need to  call to find out what he's talking about.

## 2024-08-13 NOTE — Telephone Encounter (Signed)
 Spoke w/pt. The message Dr. Fernand sent me was after [place] had spoke with pt. When I called patient he stated that he was clear on that Dr. Fernand wanted him to take the 25mg , he just was concerned that it was not discussed before it was increased. He will call to make an appointment if he feels he needs to be seen since he just started taking the Jardiance  25 mg.

## 2024-08-28 ENCOUNTER — Ambulatory Visit

## 2024-08-28 DIAGNOSIS — B351 Tinea unguium: Secondary | ICD-10-CM

## 2024-08-28 DIAGNOSIS — L600 Ingrowing nail: Secondary | ICD-10-CM | POA: Diagnosis not present

## 2024-08-31 NOTE — Progress Notes (Signed)
 "  Subjective:  Patient ID: Ricky Melton, male    DOB: 1950/10/28,  MRN: [id number]  Chief Complaint  Patient presents with   Ingrown Toenail    Ingrown toenail follow up and RFC     73 y.o. male presents with the above complaint.  He is here for follow-up of ingrown toenail avulsion sites.  He is doing well and relates minimal pain.  He is also here for routine footcare nail maintenance.   Review of Systems: Negative except as noted in the HPI. Denies N/V/F/Ch.  Past Medical History:  Diagnosis Date   Benign hematuria    CAD (coronary artery disease)    Chronic kidney disease    Hyperlipidemia    Hypertension     Current Outpatient Medications:    acetaminophen  (TYLENOL ) 500 MG tablet, Take 1,000 mg by mouth every 6 (six) hours as needed., Disp: , Rfl:    allopurinol  (ZYLOPRIM ) 300 MG tablet, Take 0.5 tablets (150 mg total) by mouth daily., Disp: 100 tablet, Rfl: 1   amLODipine  (NORVASC ) 10 MG tablet, Take 1 tablet (10 mg total) by mouth every evening., Disp: 100 tablet, Rfl: 1   aspirin  81 MG tablet, Take 81 mg by mouth daily., Disp: , Rfl:    benazepril  (LOTENSIN ) 40 MG tablet, TAKE 1 TABLET BY MOUTH DAILY, Disp: 100 tablet, Rfl: 0   carvedilol  (COREG ) 25 MG tablet, Take 1 tablet (25 mg total) by mouth 2 (two) times daily., Disp: 60 tablet, Rfl: 11   clopidogrel  (PLAVIX ) 75 MG tablet, Take 1 tablet (75 mg total) by mouth daily., Disp: 100 tablet, Rfl: 1   JARDIANCE  25 MG TABS tablet, TAKE 1 TABLET (25 MG TOTAL) BY MOUTH DAILY., Disp: 30 tablet, Rfl: 2   levothyroxine  (SYNTHROID ) 50 MCG tablet, Take 1 tablet (50 mcg total) by mouth daily., Disp: 100 tablet, Rfl: 3   rosuvastatin  (CRESTOR ) 40 MG tablet, Take 1 tablet (40 mg total) by mouth daily., Disp: 100 tablet, Rfl: 1   Vitamin D , Ergocalciferol , (DRISDOL ) 1.25 MG (50000 UNIT) CAPS capsule, Take 1 capsule (50,000 Units total) by mouth every 7 (seven) days., Disp: 12 capsule, Rfl: 1  Social History   Tobacco Use  Smoking  Status Never   Passive exposure: Past  Smokeless Tobacco Former   Types: Chew   Quit date: 2012    Allergies  Allergen Reactions   Hydrochlorothiazide  Other (See Comments)    dizziness   Objective:  There were no vitals filed for this visit. There is no height or weight on file to calculate BMI. Constitutional Well developed. Well nourished.  Vascular Dorsalis pedis pulses palpable bilaterally. Posterior tibial pulses palpable bilaterally. Capillary refill normal to all digits.  No cyanosis or clubbing noted. Pedal hair growth normal.  Neurologic Normal speech. Oriented to person, place, and time. Epicritic sensation to light touch grossly present bilaterally.  Dermatologic Right hallux nail avulsion sites healing well with ingrowth of mixed granular and fibrotic tissue.  No infection identified. Left hallux nail has minimal regrowth after full nail avulsion.  No signs of infection, no discomfort. No other open wounds. No skin lesions.  Orthopedic: Normal joint ROM without pain or crepitus bilaterally. No visible deformities. No bony tenderness.   Radiographs: None Assessment:   1. Ingrown right big toenail   2. Onychomycosis     Plan:  Patient was evaluated and treated and all questions answered.  Ingrown Nail, right - Doing well status post bilateral border nail avulsion of right hallux.  Healing well.  Onychomycosis, nails 1 through 5 bilateral - Debrided nails 2 through 5 bilateral out of courtesy without incident.  Patient expresses faction.  RTC PRN  Prentice Ovens, DPM AACFAS Fellowship Trained Podiatric Surgeon [facility]  "

## 2024-09-17 ENCOUNTER — Other Ambulatory Visit: Payer: Self-pay | Admitting: Family Medicine

## 2024-09-17 DIAGNOSIS — I10 Essential (primary) hypertension: Secondary | ICD-10-CM

## 2024-09-22 NOTE — Telephone Encounter (Signed)
 Can we call him about this?

## 2024-10-05 ENCOUNTER — Other Ambulatory Visit: Payer: Self-pay | Admitting: Family Medicine

## 2024-10-05 DIAGNOSIS — E039 Hypothyroidism, unspecified: Secondary | ICD-10-CM

## 2024-10-06 NOTE — Telephone Encounter (Signed)
 Requested Prescriptions  Refused Prescriptions Disp Refills   levothyroxine  (SYNTHROID ) 50 MCG tablet [Pharmacy Med Name: LEVOTHYROXINE  50 MCG TABLET] 100 tablet 2    Sig: TAKE 1 TABLET BY MOUTH EVERY DAY     Endocrinology:  Hypothyroid Agents Passed - 10/06/2024  1:54 PM      Passed - TSH in normal range and within 360 days    TSH  Date Value Ref Range Status  04/10/2024 2.120 0.450 - 4.500 uIU/mL Final         Passed - Valid encounter within last 12 months    Recent Outpatient Visits           2 months ago Ingrown nail of great toe of right foot   [facility] [place], Megan P, DO   5 months ago Mixed hyperlipidemia   [facility] [place], Megan P, DO   7 months ago Upper respiratory tract infection, unspecified type   [place] [facility] Herold Hadassah SQUIBB, MD   10 months ago Cellulitis of great toe of left foot   [facility] [facility], Megan P, DO   10 months ago Cellulitis of great toe of left foot   [facility] Vicci Duwaine SQUIBB, DO       Future Appointments             In 1 month Fernand Denyse LABOR, MD [facility]

## 2024-10-11 ENCOUNTER — Other Ambulatory Visit: Payer: Self-pay | Admitting: Cardiovascular Disease

## 2024-10-11 DIAGNOSIS — I5033 Acute on chronic diastolic (congestive) heart failure: Secondary | ICD-10-CM

## 2024-10-11 DIAGNOSIS — I10 Essential (primary) hypertension: Secondary | ICD-10-CM

## 2024-10-16 ENCOUNTER — Ambulatory Visit: Payer: Self-pay | Admitting: Family Medicine

## 2024-10-16 ENCOUNTER — Ambulatory Visit: Admitting: Family Medicine

## 2024-10-16 ENCOUNTER — Encounter: Payer: Self-pay | Admitting: Family Medicine

## 2024-10-16 ENCOUNTER — Ambulatory Visit
Admission: RE | Admit: 2024-10-16 | Discharge: 2024-10-16 | Disposition: A | Source: Ambulatory Visit | Attending: Family Medicine

## 2024-10-16 VITALS — BP 130/62 | HR 65 | Temp 97.6°F | Ht 64.0 in | Wt 236.4 lb

## 2024-10-16 DIAGNOSIS — E039 Hypothyroidism, unspecified: Secondary | ICD-10-CM

## 2024-10-16 DIAGNOSIS — N400 Enlarged prostate without lower urinary tract symptoms: Secondary | ICD-10-CM

## 2024-10-16 DIAGNOSIS — R7303 Prediabetes: Secondary | ICD-10-CM

## 2024-10-16 DIAGNOSIS — M1000 Idiopathic gout, unspecified site: Secondary | ICD-10-CM

## 2024-10-16 DIAGNOSIS — R300 Dysuria: Secondary | ICD-10-CM

## 2024-10-16 DIAGNOSIS — Z0000 Encounter for general adult medical examination without abnormal findings: Secondary | ICD-10-CM

## 2024-10-16 DIAGNOSIS — E559 Vitamin D deficiency, unspecified: Secondary | ICD-10-CM

## 2024-10-16 DIAGNOSIS — I10 Essential (primary) hypertension: Secondary | ICD-10-CM

## 2024-10-16 DIAGNOSIS — E782 Mixed hyperlipidemia: Secondary | ICD-10-CM

## 2024-10-16 DIAGNOSIS — I5032 Chronic diastolic (congestive) heart failure: Secondary | ICD-10-CM | POA: Insufficient documentation

## 2024-10-16 DIAGNOSIS — H539 Unspecified visual disturbance: Secondary | ICD-10-CM

## 2024-10-16 DIAGNOSIS — N1832 Chronic kidney disease, stage 3b: Secondary | ICD-10-CM

## 2024-10-16 LAB — MICROALBUMIN, URINE WAIVED
Creatinine, Urine Waived: 100 mg/dL (ref 10–300)
Microalb, Ur Waived: 150 mg/L — ABNORMAL HIGH (ref 0–19)
Microalb/Creat Ratio: >300 mg/g — ABNORMAL HIGH

## 2024-10-16 LAB — BAYER DCA HB A1C WAIVED: HB A1C (BAYER DCA - WAIVED): 5.9 % — ABNORMAL HIGH (ref 4.8–5.6)

## 2024-10-16 MED ORDER — AMLODIPINE BESYLATE 10 MG PO TABS: 10.0000 mg | ORAL_TABLET | Freq: Every evening | ORAL | 1 refills | Status: AC

## 2024-10-16 MED ORDER — BENAZEPRIL HCL 40 MG PO TABS: 40.0000 mg | ORAL_TABLET | Freq: Every day | ORAL | 1 refills | Status: AC

## 2024-10-16 MED ORDER — ALLOPURINOL 300 MG PO TABS: 150.0000 mg | ORAL_TABLET | Freq: Every day | ORAL | 1 refills | Status: AC

## 2024-10-16 MED ORDER — ROSUVASTATIN CALCIUM 40 MG PO TABS: 40.0000 mg | ORAL_TABLET | Freq: Every day | ORAL | 1 refills | Status: AC

## 2024-10-16 MED ORDER — CARVEDILOL 25 MG PO TABS: 25.0000 mg | ORAL_TABLET | Freq: Two times a day (BID) | ORAL | 1 refills | Status: AC

## 2024-10-16 MED ORDER — CLOPIDOGREL BISULFATE 75 MG PO TABS: 75.0000 mg | ORAL_TABLET | Freq: Every day | ORAL | 1 refills | Status: AC

## 2024-10-16 NOTE — Assessment & Plan Note (Signed)
 Under good control on current regimen. Continue current regimen. Continue to monitor. Call with any concerns. Refills given. Labs drawn today.

## 2024-10-16 NOTE — Assessment & Plan Note (Signed)
 Continue to follow with cardiology. Scheduled for an echo in like 2 weeks. Call with any concerns. Continue current regimen.

## 2024-10-16 NOTE — Assessment & Plan Note (Signed)
 Rechecking labs today. Await results. Treat as needed.

## 2024-10-16 NOTE — Telephone Encounter (Unsigned)
 Copied from CRM [id number]. Topic: Clinical - Medical Advice >> Oct 16, 2024  4:12 PM Charolett L wrote: Reason for CRM: Patient called in and stated that he just did a CT Scan and awaiting the appt for any other test needed for Neck. Adv that theres no 2 appts showing and that's for the physical and CT. Patient requesting a call back to be adv when he can do the test for his neck as requested in today's appt

## 2024-10-16 NOTE — Assessment & Plan Note (Signed)
 Encouraged diet and exercise with goal of losing 1-2lbs per week. Call with any concerns.

## 2024-10-16 NOTE — Patient Instructions (Signed)
 [facility] [address] [place],  [place]  [address]  Get Driving Directions Main: [phone]
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
## 2024-10-17 LAB — TSH: TSH: 2.66 u[IU]/mL (ref 0.450–4.500)

## 2024-10-17 LAB — LIPID PANEL W/O CHOL/HDL RATIO
Cholesterol, Total: 122 mg/dL (ref 100–199)
HDL: 35 mg/dL — ABNORMAL LOW
LDL Chol Calc (NIH): 65 mg/dL (ref 0–99)
Triglycerides: 124 mg/dL (ref 0–149)
VLDL Cholesterol Cal: 22 mg/dL (ref 5–40)

## 2024-10-17 LAB — CBC WITH DIFFERENTIAL/PLATELET
Basophils Absolute: 0.1 10*3/uL (ref 0.0–0.2)
Basos: 1 %
EOS (ABSOLUTE): 0.2 10*3/uL (ref 0.0–0.4)
Eos: 3 %
Hematocrit: 46.7 % (ref 37.5–51.0)
Hemoglobin: 14.9 g/dL (ref 13.0–17.7)
Immature Grans (Abs): 0 10*3/uL (ref 0.0–0.1)
Immature Granulocytes: 0 %
Lymphocytes Absolute: 2.2 10*3/uL (ref 0.7–3.1)
Lymphs: 31 %
MCH: 28.2 pg (ref 26.6–33.0)
MCHC: 31.9 g/dL (ref 31.5–35.7)
MCV: 88 fL (ref 79–97)
Monocytes Absolute: 0.6 10*3/uL (ref 0.1–0.9)
Monocytes: 9 %
Neutrophils Absolute: 3.9 10*3/uL (ref 1.4–7.0)
Neutrophils: 56 %
Platelets: 252 10*3/uL (ref 150–450)
RBC: 5.29 x10E6/uL (ref 4.14–5.80)
RDW: 14.1 % (ref 11.6–15.4)
WBC: 7 10*3/uL (ref 3.4–10.8)

## 2024-10-17 LAB — PSA: Prostate Specific Ag, Serum: 2.6 ng/mL (ref 0.0–4.0)

## 2024-10-17 LAB — COMPREHENSIVE METABOLIC PANEL WITH GFR
ALT: 13 [IU]/L (ref 0–44)
AST: 18 [IU]/L (ref 0–40)
Albumin: 4.3 g/dL (ref 3.8–4.8)
Alkaline Phosphatase: 61 [IU]/L (ref 47–123)
BUN/Creatinine Ratio: 13 (ref 10–24)
BUN: 22 mg/dL (ref 8–27)
Bilirubin Total: 0.4 mg/dL (ref 0.0–1.2)
CO2: 18 mmol/L — ABNORMAL LOW (ref 20–29)
Calcium: 10 mg/dL (ref 8.6–10.2)
Chloride: 108 mmol/L — ABNORMAL HIGH (ref 96–106)
Creatinine, Ser: 1.71 mg/dL — ABNORMAL HIGH (ref 0.76–1.27)
Globulin, Total: 2.8 g/dL (ref 1.5–4.5)
Glucose: 99 mg/dL (ref 70–99)
Potassium: 4.6 mmol/L (ref 3.5–5.2)
Sodium: 143 mmol/L (ref 134–144)
Total Protein: 7.1 g/dL (ref 6.0–8.5)
eGFR: 41 mL/min/{1.73_m2} — ABNORMAL LOW

## 2024-10-17 LAB — VITAMIN D 25 HYDROXY (VIT D DEFICIENCY, FRACTURES): Vit D, 25-Hydroxy: 26.9 ng/mL — ABNORMAL LOW (ref 30.0–100.0)

## 2024-11-06 ENCOUNTER — Other Ambulatory Visit

## 2024-11-13 ENCOUNTER — Ambulatory Visit: Admitting: Family Medicine

## 2024-11-20 ENCOUNTER — Ambulatory Visit: Admitting: Cardiovascular Disease

## 2025-01-29 ENCOUNTER — Ambulatory Visit
